# Patient Record
Sex: Male | Born: 1935 | Race: White | Hispanic: No | Smoking: Former smoker
Health system: Southern US, Community
[De-identification: ages and names within clinical notes are randomized; demographics above are authoritative.]

## PROBLEM LIST (undated history)

## (undated) DIAGNOSIS — D649 Anemia, unspecified: Secondary | ICD-10-CM

## (undated) DIAGNOSIS — I10 Essential (primary) hypertension: Secondary | ICD-10-CM

## (undated) DIAGNOSIS — N189 Chronic kidney disease, unspecified: Secondary | ICD-10-CM

## (undated) DIAGNOSIS — M1990 Unspecified osteoarthritis, unspecified site: Secondary | ICD-10-CM

## (undated) DIAGNOSIS — Z8489 Family history of other specified conditions: Secondary | ICD-10-CM

## (undated) DIAGNOSIS — J45909 Unspecified asthma, uncomplicated: Secondary | ICD-10-CM

## (undated) DIAGNOSIS — H409 Unspecified glaucoma: Secondary | ICD-10-CM

## (undated) HISTORY — PX: KNEE ARTHROSCOPY: SUR90

## (undated) HISTORY — PX: ROTATOR CUFF REPAIR: SHX139

## (undated) HISTORY — PX: SHOULDER ARTHROSCOPY: SHX128

---

## 2000-11-11 ENCOUNTER — Encounter: Payer: Self-pay | Admitting: Emergency Medicine

## 2000-11-11 ENCOUNTER — Inpatient Hospital Stay (HOSPITAL_COMMUNITY): Admission: EM | Admit: 2000-11-11 | Discharge: 2000-11-15 | Payer: Self-pay

## 2000-11-12 ENCOUNTER — Encounter: Payer: Self-pay | Admitting: Internal Medicine

## 2000-11-13 ENCOUNTER — Encounter: Payer: Self-pay | Admitting: Internal Medicine

## 2000-11-14 ENCOUNTER — Encounter: Payer: Self-pay | Admitting: Internal Medicine

## 2000-11-26 ENCOUNTER — Encounter: Admission: RE | Admit: 2000-11-26 | Discharge: 2000-11-26 | Payer: Self-pay

## 2015-01-13 DIAGNOSIS — M109 Gout, unspecified: Secondary | ICD-10-CM | POA: Diagnosis not present

## 2015-01-13 DIAGNOSIS — Z79899 Other long term (current) drug therapy: Secondary | ICD-10-CM | POA: Diagnosis not present

## 2015-01-13 DIAGNOSIS — M25569 Pain in unspecified knee: Secondary | ICD-10-CM | POA: Diagnosis not present

## 2015-01-13 DIAGNOSIS — M25561 Pain in right knee: Secondary | ICD-10-CM | POA: Diagnosis not present

## 2015-01-21 DIAGNOSIS — R140 Abdominal distension (gaseous): Secondary | ICD-10-CM | POA: Diagnosis not present

## 2015-01-21 DIAGNOSIS — R195 Other fecal abnormalities: Secondary | ICD-10-CM | POA: Diagnosis not present

## 2015-01-27 ENCOUNTER — Other Ambulatory Visit: Payer: Self-pay | Admitting: Gastroenterology

## 2015-01-27 NOTE — Addendum Note (Signed)
Addended by: Arta Silence on: 01/27/2015 03:22 PM   Modules accepted: Orders

## 2015-01-28 ENCOUNTER — Encounter (HOSPITAL_COMMUNITY): Payer: Self-pay | Admitting: *Deleted

## 2015-02-01 ENCOUNTER — Other Ambulatory Visit: Payer: Self-pay | Admitting: Gastroenterology

## 2015-02-01 NOTE — Addendum Note (Signed)
Addended by: Arta Silence on: 02/01/2015 09:18 AM   Modules accepted: Orders

## 2015-02-03 ENCOUNTER — Ambulatory Visit (HOSPITAL_COMMUNITY): Payer: PPO | Admitting: Certified Registered Nurse Anesthetist

## 2015-02-03 ENCOUNTER — Encounter (HOSPITAL_COMMUNITY)
Admission: RE | Disposition: A | Discharge status: Home / Self Care | Payer: Self-pay | Source: Ambulatory Visit | Attending: Gastroenterology

## 2015-02-03 ENCOUNTER — Encounter (HOSPITAL_COMMUNITY): Payer: Self-pay

## 2015-02-03 ENCOUNTER — Ambulatory Visit (HOSPITAL_COMMUNITY)
Admission: RE | Admit: 2015-02-03 | Discharge: 2015-02-03 | Disposition: A | Discharge status: Home / Self Care | Payer: PPO | Source: Ambulatory Visit | Attending: Gastroenterology | Admitting: Gastroenterology

## 2015-02-03 DIAGNOSIS — K219 Gastro-esophageal reflux disease without esophagitis: Secondary | ICD-10-CM | POA: Insufficient documentation

## 2015-02-03 DIAGNOSIS — Z87891 Personal history of nicotine dependence: Secondary | ICD-10-CM | POA: Diagnosis not present

## 2015-02-03 DIAGNOSIS — D649 Anemia, unspecified: Secondary | ICD-10-CM | POA: Insufficient documentation

## 2015-02-03 DIAGNOSIS — I10 Essential (primary) hypertension: Secondary | ICD-10-CM | POA: Diagnosis not present

## 2015-02-03 DIAGNOSIS — K5730 Diverticulosis of large intestine without perforation or abscess without bleeding: Secondary | ICD-10-CM | POA: Insufficient documentation

## 2015-02-03 DIAGNOSIS — J45909 Unspecified asthma, uncomplicated: Secondary | ICD-10-CM | POA: Diagnosis not present

## 2015-02-03 DIAGNOSIS — D500 Iron deficiency anemia secondary to blood loss (chronic): Secondary | ICD-10-CM | POA: Diagnosis not present

## 2015-02-03 DIAGNOSIS — M1990 Unspecified osteoarthritis, unspecified site: Secondary | ICD-10-CM | POA: Insufficient documentation

## 2015-02-03 DIAGNOSIS — I129 Hypertensive chronic kidney disease with stage 1 through stage 4 chronic kidney disease, or unspecified chronic kidney disease: Secondary | ICD-10-CM | POA: Diagnosis not present

## 2015-02-03 DIAGNOSIS — Z79899 Other long term (current) drug therapy: Secondary | ICD-10-CM | POA: Insufficient documentation

## 2015-02-03 DIAGNOSIS — R195 Other fecal abnormalities: Secondary | ICD-10-CM | POA: Insufficient documentation

## 2015-02-03 DIAGNOSIS — N189 Chronic kidney disease, unspecified: Secondary | ICD-10-CM | POA: Diagnosis not present

## 2015-02-03 DIAGNOSIS — K5790 Diverticulosis of intestine, part unspecified, without perforation or abscess without bleeding: Secondary | ICD-10-CM | POA: Diagnosis not present

## 2015-02-03 DIAGNOSIS — K921 Melena: Secondary | ICD-10-CM | POA: Diagnosis not present

## 2015-02-03 DIAGNOSIS — K648 Other hemorrhoids: Secondary | ICD-10-CM | POA: Diagnosis not present

## 2015-02-03 DIAGNOSIS — K5732 Diverticulitis of large intestine without perforation or abscess without bleeding: Secondary | ICD-10-CM | POA: Diagnosis not present

## 2015-02-03 HISTORY — DX: Anemia, unspecified: D64.9

## 2015-02-03 HISTORY — DX: Chronic kidney disease, unspecified: N18.9

## 2015-02-03 HISTORY — DX: Unspecified osteoarthritis, unspecified site: M19.90

## 2015-02-03 HISTORY — PX: COLONOSCOPY WITH PROPOFOL: SHX5780

## 2015-02-03 HISTORY — DX: Family history of other specified conditions: Z84.89

## 2015-02-03 HISTORY — DX: Unspecified asthma, uncomplicated: J45.909

## 2015-02-03 HISTORY — DX: Essential (primary) hypertension: I10

## 2015-02-03 SURGERY — COLONOSCOPY WITH PROPOFOL
Anesthesia: Monitor Anesthesia Care

## 2015-02-03 MED ORDER — ONDANSETRON HCL 4 MG/2ML IJ SOLN
INTRAMUSCULAR | Status: DC | PRN
  Administered 2015-02-03: 4 mg via INTRAVENOUS

## 2015-02-03 MED ORDER — LIDOCAINE HCL (CARDIAC) 20 MG/ML IV SOLN
INTRAVENOUS | Status: AC
  Filled 2015-02-03: qty 5

## 2015-02-03 MED ORDER — LACTATED RINGERS IV SOLN
INTRAVENOUS | Status: DC
  Administered 2015-02-03: 1000 mL via INTRAVENOUS

## 2015-02-03 MED ORDER — PROPOFOL 500 MG/50ML IV EMUL
INTRAVENOUS | Status: DC | PRN
  Administered 2015-02-03: 75 ug/kg/min via INTRAVENOUS

## 2015-02-03 MED ORDER — METOPROLOL TARTRATE 1 MG/ML IV SOLN
INTRAVENOUS | Status: DC | PRN
  Administered 2015-02-03: 1 mg via INTRAVENOUS

## 2015-02-03 MED ORDER — PROPOFOL 10 MG/ML IV BOLUS
INTRAVENOUS | Status: AC
  Filled 2015-02-03: qty 60

## 2015-02-03 MED ORDER — ONDANSETRON HCL 4 MG/2ML IJ SOLN
INTRAMUSCULAR | Status: AC
  Filled 2015-02-03: qty 2

## 2015-02-03 MED ORDER — METOPROLOL TARTRATE 1 MG/ML IV SOLN
INTRAVENOUS | Status: AC
  Filled 2015-02-03: qty 5

## 2015-02-03 MED ORDER — SODIUM CHLORIDE 0.9 % IV SOLN: INTRAVENOUS | Status: DC

## 2015-02-03 MED ORDER — LIDOCAINE HCL (CARDIAC) 20 MG/ML IV SOLN
INTRAVENOUS | Status: DC | PRN
  Administered 2015-02-03: 100 mg via INTRAVENOUS

## 2015-02-03 MED ORDER — PROPOFOL 10 MG/ML IV BOLUS
INTRAVENOUS | Status: DC | PRN
  Administered 2015-02-03 (×4): 10 mg via INTRAVENOUS

## 2015-02-03 MED ORDER — LACTATED RINGERS IV SOLN: INTRAVENOUS | Status: DC

## 2015-02-03 SURGICAL SUPPLY — 21 items

## 2015-02-03 NOTE — Anesthesia Postprocedure Evaluation (Signed)
Anesthesia Post Note  Patient: Nicholas [name]  Procedure(s) Performed: Procedure(s) (LRB): COLONOSCOPY WITH PROPOFOL (N/A)  Patient location during evaluation: PACU Anesthesia Type: MAC Level of consciousness: awake and alert Pain management: pain level controlled Vital Signs Assessment: post-procedure vital signs reviewed and stable Respiratory status: spontaneous breathing, nonlabored ventilation, respiratory function stable and patient connected to nasal cannula oxygen Cardiovascular status: stable and blood pressure returned to baseline Anesthetic complications: no    Last Vitals:  Filed Vitals:   02/03/15 1020 02/03/15 1028  BP: 166/78 171/63  Pulse: 52 57  Temp:    Resp: 16     Last Pain: There were no vitals filed for this visit.               Effie Berkshire

## 2015-02-03 NOTE — Op Note (Signed)
[facility] [place], [address]   COLONOSCOPY PROCEDURE REPORT  PATIENT: [name], Nicholas  MR#: [id number] BIRTHDATE: Jun 05, 1933 , 81  yrs. old GENDER: male ENDOSCOPIST: Arta Silence, MD REFERRED [place]:[id number] Marcello Moores, M.D. PROCEDURE DATE:  Mar 03, 2015 PROCEDURE:   Colonoscopy, diagnostic ASA CLASS:   Class III INDICATIONS:hemoccult-positive stools, (mild) anemia. MEDICATIONS: Monitored anesthesia care  DESCRIPTION OF PROCEDURE:   After the risks benefits and alternatives of the procedure were thoroughly explained, informed consent was obtained.  revealed no abnormalities of the rectum. The pediatric colonoscope was introduced through the anus and advanced to the cecum, which was identified by both the appendix and ileocecal valve. No adverse events experienced.   The quality of the prep was adequate  The instrument was then slowly withdrawn as the colon was fully examined. Estimated blood loss is zero unless otherwise noted in this procedure report.    Findings:  Digital rectal exam normal.  Prep quality adequate.  No polyps, masses, vascular ectasias, or inflammatory changes were seen.  Extensive medium- and large-caliber diverticula seen in sigmoid and descending colon.  Moderate internal hemorrhoids, otherwise normal retroflexed view of rectum.  Withdrawal time was 9 minutes.           Withdrawal time was   .  The scope was withdrawn and the procedure completed.  COMPLICATIONS: None immediate.  ENDOSCOPIC IMPRESSION:     Left-sided diverticulosis.  Moderate internal hemorrhoids, likely cause of patient's heme-positive stool.  RECOMMENDATIONS:     1.  Watch for potential complications of procedure. 2.  High fiber diet. 3.  Topical therapies (e.g., Preparation-H) as needed for hemorrhoidal bleeding and/or irritation. 4.  Would not pursue any further testing for patient's hemoccult-positive stool. 5.  Would stop checking hemoccults  indefinitely. 6.  Would not pursue any further routine colonoscopies, in absence of new/interval symptoms, in light of patient's age and comorbidities. 7.  Follow-up with [facility] on as-needed basis.  eSigned:  Arta Silence, MD March 03, 2015 10:00 AM   cc:  CPT CODES: ICD CODES:  The ICD and CPT codes recommended by this software are interpretations from the data that the clinical staff has captured with the software.  The verification of the translation of this report to the ICD and CPT codes and modifiers is the sole responsibility of the health care institution and practicing physician where this report was generated.  [employer]. will not be held responsible for the validity of the ICD and CPT codes included on this report.  AMA assumes no liability for data contained or not contained herein. CPT is a Designer, television/film set of the [employer].

## 2015-02-03 NOTE — Anesthesia Preprocedure Evaluation (Addendum)
Anesthesia Evaluation  Patient identified by MRN, date of birth, ID band Patient awake    Reviewed: Allergy & Precautions, NPO status , Patient's Chart, lab work & pertinent test results  Airway Mallampati: II  TM Distance: >3 FB Neck ROM: Full    Dental  (+) Upper Dentures, Lower Dentures   Pulmonary asthma , former smoker,    breath sounds clear to auscultation       Cardiovascular hypertension, Pt. on medications  Rhythm:Regular Rate:Normal + Systolic murmurs    Neuro/Psych negative neurological ROS  negative psych ROS   GI/Hepatic Neg liver ROS, GERD  Medicated,  Endo/Other  negative endocrine ROS  Renal/GU Renal disease  negative genitourinary   Musculoskeletal  (+) Arthritis ,   Abdominal   Peds negative pediatric ROS (+)  Hematology   Anesthesia Other Findings   Reproductive/Obstetrics                         Per outside records Hgb 12 K 4.8 Cr 1.6  Anesthesia Physical Anesthesia Plan  ASA: III  Anesthesia Plan: MAC   Post-op Pain Management:    Induction: Intravenous  Airway Management Planned: Natural Airway  Additional Equipment:   Intra-op Plan:   Post-operative Plan:   Informed Consent: I have reviewed the patients History and Physical, chart, labs and discussed the procedure including the risks, benefits and alternatives for the proposed anesthesia with the patient or authorized representative who has indicated his/her understanding and acceptance.     Plan Discussed with: CRNA  Anesthesia Plan Comments:         Anesthesia Quick Evaluation

## 2015-02-03 NOTE — H&P (Signed)
Patient interval history reviewed.  Patient examined again.  There has been no change from documented H/P dated 01/21/15 scanned into chart from our office) except as documented above.  Assessment:  1.  Hemoccult-positive stools. 2.  Anemia, chronic, mild.  Plan:  1.  Colonoscopy. 2.  Risks (bleeding, infection, bowel perforation that could require surgery, sedation-related changes in cardiopulmonary systems), benefits (identification and possible treatment of source of symptoms, exclusion of certain causes of symptoms), and alternatives (watchful waiting, radiographic imaging studies, empiric medical treatment) of colonoscopy were explained to patient/family in detail and patient wishes to proceed.

## 2015-02-03 NOTE — Discharge Instructions (Signed)
Colonoscopy ° °Post procedure instructions: ° °Read the instructions outlined below and refer to this sheet in the next few weeks. These discharge instructions provide you with general information on caring for yourself after you leave the hospital. Your doctor may also give you specific instructions. While your treatment has been planned according to the most current medical practices available, unavoidable complications occasionally occur. If you have any problems or questions after discharge, call Dr. Shakeya Kerkman at [facility] ([phone]). ° °HOME CARE INSTRUCTIONS ° °ACTIVITY: °· You may resume your regular activity, but move at a slower pace for the next 24 hours.  °· Take frequent rest periods for the next 24 hours.  °· Walking will help get rid of the air and reduce the bloated feeling in your belly (abdomen).  °· No driving for 24 hours (because of the medicine (anesthesia) used during the test).  °· You may shower.  °· Do not sign any important legal documents or operate any machinery for 24 hours (because of the anesthesia used during the test).  °NUTRITION: °· Drink plenty of fluids.  °· You may resume your normal diet as instructed by your doctor.  °· Begin with a light meal and progress to your normal diet. Heavy or fried foods are harder to digest and may make you feel sick to your stomach (nauseated).  °· Avoid alcoholic beverages for 24 hours or as instructed.  °MEDICATIONS: °· You may resume your normal medications unless your doctor tells you otherwise.  °WHAT TO EXPECT TODAY: °· Some feelings of bloating in the abdomen.  °· Passage of more gas than usual.  °· Spotting of blood in your stool or on the toilet paper.  °IF YOU HAD POLYPS REMOVED DURING THE COLONOSCOPY: °· No aspirin products for 7 days or as instructed.  °· No alcohol for 7 days or as instructed.  °· Eat a soft diet for the next 24 hours.  ° °FINDING OUT THE RESULTS OF YOUR TEST ° °Not all test results are available during your  visit. If your test results are not back during the visit, make an appointment with your caregiver to find out the results. Do not assume everything is normal if you have not heard from your caregiver or the medical facility. It is important for you to follow up on all of your test results.  ° ° ° °SEEK IMMEDIATE MEDICAL CARE IF: ° °· You have more than a spotting of blood in your stool.  °· Your belly is swollen (abdominal distention).  °· You are nauseated or vomiting.  °· You have a fever.  °· You have abdominal pain or discomfort that is severe or gets worse throughout the day.  ° ° °Document Released: 08/10/2003 Document Revised: 09/07/2010 Document Reviewed: 08/08/2007 °Exit[employer]® Patient Information ©2012 [employer]. ° °

## 2015-02-03 NOTE — Transfer of Care (Signed)
Immediate Anesthesia Transfer of Care Note  Patient: Nicholas [name]  Procedure(s) Performed: Procedure(s): COLONOSCOPY WITH PROPOFOL (N/A)  Patient Location: ENDO  Anesthesia Type:MAC  Level of Consciousness:  sedated, patient cooperative and responds to stimulation  Airway & Oxygen Therapy:Patient Spontanous Breathing and Patient connected to face mask oxgen  Post-op Assessment:  Report given to ENDO RN and Post -op Vital signs reviewed and stable  Post vital signs:  Reviewed and stable  Last Vitals:  Filed Vitals:   02/03/15 0812  BP: 182/62  Pulse: 55  Temp: 36.6 C  Resp: 16    Complications: No apparent anesthesia complications

## 2015-02-04 ENCOUNTER — Encounter (HOSPITAL_COMMUNITY): Payer: Self-pay | Admitting: Gastroenterology

## 2015-02-09 DIAGNOSIS — L089 Local infection of the skin and subcutaneous tissue, unspecified: Secondary | ICD-10-CM | POA: Diagnosis not present

## 2015-02-09 DIAGNOSIS — T148 Other injury of unspecified body region: Secondary | ICD-10-CM | POA: Diagnosis not present

## 2015-05-31 DIAGNOSIS — Z1389 Encounter for screening for other disorder: Secondary | ICD-10-CM | POA: Diagnosis not present

## 2015-05-31 DIAGNOSIS — Z6830 Body mass index (BMI) 30.0-30.9, adult: Secondary | ICD-10-CM | POA: Diagnosis not present

## 2015-05-31 DIAGNOSIS — E669 Obesity, unspecified: Secondary | ICD-10-CM | POA: Diagnosis not present

## 2015-05-31 DIAGNOSIS — E785 Hyperlipidemia, unspecified: Secondary | ICD-10-CM | POA: Diagnosis not present

## 2015-05-31 DIAGNOSIS — Z125 Encounter for screening for malignant neoplasm of prostate: Secondary | ICD-10-CM | POA: Diagnosis not present

## 2015-05-31 DIAGNOSIS — Z136 Encounter for screening for cardiovascular disorders: Secondary | ICD-10-CM | POA: Diagnosis not present

## 2015-05-31 DIAGNOSIS — Z9181 History of falling: Secondary | ICD-10-CM | POA: Diagnosis not present

## 2015-05-31 DIAGNOSIS — N183 Chronic kidney disease, stage 3 (moderate): Secondary | ICD-10-CM | POA: Diagnosis not present

## 2015-05-31 DIAGNOSIS — J449 Chronic obstructive pulmonary disease, unspecified: Secondary | ICD-10-CM | POA: Diagnosis not present

## 2015-05-31 DIAGNOSIS — R7303 Prediabetes: Secondary | ICD-10-CM | POA: Diagnosis not present

## 2015-05-31 DIAGNOSIS — K219 Gastro-esophageal reflux disease without esophagitis: Secondary | ICD-10-CM | POA: Diagnosis not present

## 2015-05-31 DIAGNOSIS — Z0000 Encounter for general adult medical examination without abnormal findings: Secondary | ICD-10-CM | POA: Diagnosis not present

## 2015-05-31 DIAGNOSIS — I129 Hypertensive chronic kidney disease with stage 1 through stage 4 chronic kidney disease, or unspecified chronic kidney disease: Secondary | ICD-10-CM | POA: Diagnosis not present

## 2015-05-31 DIAGNOSIS — M109 Gout, unspecified: Secondary | ICD-10-CM | POA: Diagnosis not present

## 2015-06-17 DIAGNOSIS — H40023 Open angle with borderline findings, high risk, bilateral: Secondary | ICD-10-CM | POA: Diagnosis not present

## 2015-06-17 DIAGNOSIS — H5203 Hypermetropia, bilateral: Secondary | ICD-10-CM | POA: Diagnosis not present

## 2015-07-30 DIAGNOSIS — H25013 Cortical age-related cataract, bilateral: Secondary | ICD-10-CM | POA: Diagnosis not present

## 2015-07-30 DIAGNOSIS — H40023 Open angle with borderline findings, high risk, bilateral: Secondary | ICD-10-CM | POA: Diagnosis not present

## 2015-10-12 DIAGNOSIS — Z23 Encounter for immunization: Secondary | ICD-10-CM | POA: Diagnosis not present

## 2015-11-02 DIAGNOSIS — H40023 Open angle with borderline findings, high risk, bilateral: Secondary | ICD-10-CM | POA: Diagnosis not present

## 2015-11-20 DIAGNOSIS — M109 Gout, unspecified: Secondary | ICD-10-CM | POA: Diagnosis not present

## 2015-12-14 DIAGNOSIS — Z1389 Encounter for screening for other disorder: Secondary | ICD-10-CM | POA: Diagnosis not present

## 2015-12-14 DIAGNOSIS — M109 Gout, unspecified: Secondary | ICD-10-CM | POA: Diagnosis not present

## 2015-12-14 DIAGNOSIS — Z0000 Encounter for general adult medical examination without abnormal findings: Secondary | ICD-10-CM | POA: Diagnosis not present

## 2015-12-14 DIAGNOSIS — I129 Hypertensive chronic kidney disease with stage 1 through stage 4 chronic kidney disease, or unspecified chronic kidney disease: Secondary | ICD-10-CM | POA: Diagnosis not present

## 2015-12-14 DIAGNOSIS — Z1211 Encounter for screening for malignant neoplasm of colon: Secondary | ICD-10-CM | POA: Diagnosis not present

## 2015-12-14 DIAGNOSIS — N183 Chronic kidney disease, stage 3 (moderate): Secondary | ICD-10-CM | POA: Diagnosis not present

## 2015-12-14 DIAGNOSIS — E559 Vitamin D deficiency, unspecified: Secondary | ICD-10-CM | POA: Diagnosis not present

## 2015-12-14 DIAGNOSIS — Z79899 Other long term (current) drug therapy: Secondary | ICD-10-CM | POA: Diagnosis not present

## 2015-12-14 DIAGNOSIS — E669 Obesity, unspecified: Secondary | ICD-10-CM | POA: Diagnosis not present

## 2015-12-14 DIAGNOSIS — Z125 Encounter for screening for malignant neoplasm of prostate: Secondary | ICD-10-CM | POA: Diagnosis not present

## 2015-12-14 DIAGNOSIS — Z9181 History of falling: Secondary | ICD-10-CM | POA: Diagnosis not present

## 2015-12-14 DIAGNOSIS — E785 Hyperlipidemia, unspecified: Secondary | ICD-10-CM | POA: Diagnosis not present

## 2015-12-20 DIAGNOSIS — Z79899 Other long term (current) drug therapy: Secondary | ICD-10-CM | POA: Diagnosis not present

## 2015-12-20 DIAGNOSIS — M109 Gout, unspecified: Secondary | ICD-10-CM | POA: Diagnosis not present

## 2015-12-20 DIAGNOSIS — E785 Hyperlipidemia, unspecified: Secondary | ICD-10-CM | POA: Diagnosis not present

## 2015-12-20 DIAGNOSIS — R7303 Prediabetes: Secondary | ICD-10-CM | POA: Diagnosis not present

## 2015-12-20 DIAGNOSIS — J449 Chronic obstructive pulmonary disease, unspecified: Secondary | ICD-10-CM | POA: Diagnosis not present

## 2015-12-20 DIAGNOSIS — I129 Hypertensive chronic kidney disease with stage 1 through stage 4 chronic kidney disease, or unspecified chronic kidney disease: Secondary | ICD-10-CM | POA: Diagnosis not present

## 2015-12-20 DIAGNOSIS — K219 Gastro-esophageal reflux disease without esophagitis: Secondary | ICD-10-CM | POA: Diagnosis not present

## 2015-12-20 DIAGNOSIS — N183 Chronic kidney disease, stage 3 (moderate): Secondary | ICD-10-CM | POA: Diagnosis not present

## 2016-05-03 DIAGNOSIS — H40023 Open angle with borderline findings, high risk, bilateral: Secondary | ICD-10-CM | POA: Diagnosis not present

## 2016-05-03 DIAGNOSIS — H25013 Cortical age-related cataract, bilateral: Secondary | ICD-10-CM | POA: Diagnosis not present

## 2016-05-22 DIAGNOSIS — M25512 Pain in left shoulder: Secondary | ICD-10-CM | POA: Diagnosis not present

## 2016-05-22 DIAGNOSIS — M7552 Bursitis of left shoulder: Secondary | ICD-10-CM | POA: Diagnosis not present

## 2016-05-24 DIAGNOSIS — M25512 Pain in left shoulder: Secondary | ICD-10-CM | POA: Diagnosis not present

## 2016-05-24 DIAGNOSIS — M7552 Bursitis of left shoulder: Secondary | ICD-10-CM | POA: Diagnosis not present

## 2016-05-31 DIAGNOSIS — M7552 Bursitis of left shoulder: Secondary | ICD-10-CM | POA: Diagnosis not present

## 2016-05-31 DIAGNOSIS — M25512 Pain in left shoulder: Secondary | ICD-10-CM | POA: Diagnosis not present

## 2016-06-01 DIAGNOSIS — Z79899 Other long term (current) drug therapy: Secondary | ICD-10-CM | POA: Diagnosis not present

## 2016-06-01 DIAGNOSIS — I10 Essential (primary) hypertension: Secondary | ICD-10-CM | POA: Diagnosis not present

## 2016-06-01 DIAGNOSIS — Z0000 Encounter for general adult medical examination without abnormal findings: Secondary | ICD-10-CM | POA: Diagnosis not present

## 2016-06-01 DIAGNOSIS — E785 Hyperlipidemia, unspecified: Secondary | ICD-10-CM | POA: Diagnosis not present

## 2016-06-07 DIAGNOSIS — M25512 Pain in left shoulder: Secondary | ICD-10-CM | POA: Diagnosis not present

## 2016-06-07 DIAGNOSIS — M7552 Bursitis of left shoulder: Secondary | ICD-10-CM | POA: Diagnosis not present

## 2016-06-14 DIAGNOSIS — H401132 Primary open-angle glaucoma, bilateral, moderate stage: Secondary | ICD-10-CM | POA: Diagnosis not present

## 2016-06-26 DIAGNOSIS — M7552 Bursitis of left shoulder: Secondary | ICD-10-CM | POA: Diagnosis not present

## 2016-07-05 DIAGNOSIS — I10 Essential (primary) hypertension: Secondary | ICD-10-CM | POA: Diagnosis not present

## 2016-08-15 DIAGNOSIS — H5203 Hypermetropia, bilateral: Secondary | ICD-10-CM | POA: Diagnosis not present

## 2016-08-15 DIAGNOSIS — H401132 Primary open-angle glaucoma, bilateral, moderate stage: Secondary | ICD-10-CM | POA: Diagnosis not present

## 2016-08-15 DIAGNOSIS — D3131 Benign neoplasm of right choroid: Secondary | ICD-10-CM | POA: Diagnosis not present

## 2016-10-17 DIAGNOSIS — Z23 Encounter for immunization: Secondary | ICD-10-CM | POA: Diagnosis not present

## 2016-11-03 DIAGNOSIS — I10 Essential (primary) hypertension: Secondary | ICD-10-CM | POA: Diagnosis not present

## 2016-12-19 DIAGNOSIS — H25013 Cortical age-related cataract, bilateral: Secondary | ICD-10-CM | POA: Diagnosis not present

## 2016-12-19 DIAGNOSIS — H401132 Primary open-angle glaucoma, bilateral, moderate stage: Secondary | ICD-10-CM | POA: Diagnosis not present

## 2016-12-25 DIAGNOSIS — R7303 Prediabetes: Secondary | ICD-10-CM | POA: Diagnosis not present

## 2016-12-25 DIAGNOSIS — Z1331 Encounter for screening for depression: Secondary | ICD-10-CM | POA: Diagnosis not present

## 2016-12-25 DIAGNOSIS — I10 Essential (primary) hypertension: Secondary | ICD-10-CM | POA: Diagnosis not present

## 2016-12-25 DIAGNOSIS — Z79899 Other long term (current) drug therapy: Secondary | ICD-10-CM | POA: Diagnosis not present

## 2016-12-25 DIAGNOSIS — J449 Chronic obstructive pulmonary disease, unspecified: Secondary | ICD-10-CM | POA: Diagnosis not present

## 2016-12-25 DIAGNOSIS — E785 Hyperlipidemia, unspecified: Secondary | ICD-10-CM | POA: Diagnosis not present

## 2016-12-25 DIAGNOSIS — K219 Gastro-esophageal reflux disease without esophagitis: Secondary | ICD-10-CM | POA: Diagnosis not present

## 2016-12-25 DIAGNOSIS — M109 Gout, unspecified: Secondary | ICD-10-CM | POA: Diagnosis not present

## 2017-01-15 DIAGNOSIS — B309 Viral conjunctivitis, unspecified: Secondary | ICD-10-CM | POA: Diagnosis not present

## 2017-01-29 DIAGNOSIS — M25562 Pain in left knee: Secondary | ICD-10-CM | POA: Diagnosis not present

## 2017-02-12 DIAGNOSIS — M25562 Pain in left knee: Secondary | ICD-10-CM | POA: Diagnosis not present

## 2017-02-12 DIAGNOSIS — M1712 Unilateral primary osteoarthritis, left knee: Secondary | ICD-10-CM | POA: Diagnosis not present

## 2017-02-14 DIAGNOSIS — M25562 Pain in left knee: Secondary | ICD-10-CM | POA: Diagnosis not present

## 2017-02-14 DIAGNOSIS — M1712 Unilateral primary osteoarthritis, left knee: Secondary | ICD-10-CM | POA: Diagnosis not present

## 2017-02-15 DIAGNOSIS — M1712 Unilateral primary osteoarthritis, left knee: Secondary | ICD-10-CM | POA: Diagnosis not present

## 2017-02-15 DIAGNOSIS — S83242A Other tear of medial meniscus, current injury, left knee, initial encounter: Secondary | ICD-10-CM | POA: Diagnosis not present

## 2017-02-22 DIAGNOSIS — M79641 Pain in right hand: Secondary | ICD-10-CM | POA: Diagnosis not present

## 2017-03-30 DIAGNOSIS — R112 Nausea with vomiting, unspecified: Secondary | ICD-10-CM | POA: Diagnosis not present

## 2017-03-30 DIAGNOSIS — Z7982 Long term (current) use of aspirin: Secondary | ICD-10-CM | POA: Diagnosis not present

## 2017-03-30 DIAGNOSIS — R030 Elevated blood-pressure reading, without diagnosis of hypertension: Secondary | ICD-10-CM | POA: Diagnosis not present

## 2017-03-30 DIAGNOSIS — I6789 Other cerebrovascular disease: Secondary | ICD-10-CM | POA: Diagnosis not present

## 2017-03-30 DIAGNOSIS — I129 Hypertensive chronic kidney disease with stage 1 through stage 4 chronic kidney disease, or unspecified chronic kidney disease: Secondary | ICD-10-CM | POA: Diagnosis not present

## 2017-03-30 DIAGNOSIS — R42 Dizziness and giddiness: Secondary | ICD-10-CM | POA: Diagnosis not present

## 2017-03-30 DIAGNOSIS — I252 Old myocardial infarction: Secondary | ICD-10-CM | POA: Diagnosis not present

## 2017-03-30 DIAGNOSIS — K219 Gastro-esophageal reflux disease without esophagitis: Secondary | ICD-10-CM | POA: Diagnosis not present

## 2017-03-30 DIAGNOSIS — I10 Essential (primary) hypertension: Secondary | ICD-10-CM | POA: Diagnosis not present

## 2017-03-30 DIAGNOSIS — E7800 Pure hypercholesterolemia, unspecified: Secondary | ICD-10-CM | POA: Diagnosis not present

## 2017-03-30 DIAGNOSIS — G459 Transient cerebral ischemic attack, unspecified: Secondary | ICD-10-CM | POA: Diagnosis not present

## 2017-03-30 DIAGNOSIS — I6523 Occlusion and stenosis of bilateral carotid arteries: Secondary | ICD-10-CM | POA: Diagnosis not present

## 2017-03-30 DIAGNOSIS — N183 Chronic kidney disease, stage 3 (moderate): Secondary | ICD-10-CM | POA: Diagnosis not present

## 2017-03-30 DIAGNOSIS — R413 Other amnesia: Secondary | ICD-10-CM | POA: Diagnosis not present

## 2017-03-30 DIAGNOSIS — E785 Hyperlipidemia, unspecified: Secondary | ICD-10-CM | POA: Diagnosis not present

## 2017-03-30 DIAGNOSIS — Z79899 Other long term (current) drug therapy: Secondary | ICD-10-CM | POA: Diagnosis not present

## 2017-03-30 DIAGNOSIS — G9389 Other specified disorders of brain: Secondary | ICD-10-CM | POA: Diagnosis not present

## 2017-03-30 DIAGNOSIS — R531 Weakness: Secondary | ICD-10-CM | POA: Diagnosis not present

## 2017-03-31 DIAGNOSIS — I6789 Other cerebrovascular disease: Secondary | ICD-10-CM | POA: Diagnosis not present

## 2017-03-31 DIAGNOSIS — R42 Dizziness and giddiness: Secondary | ICD-10-CM | POA: Diagnosis not present

## 2017-03-31 DIAGNOSIS — I10 Essential (primary) hypertension: Secondary | ICD-10-CM | POA: Diagnosis not present

## 2017-04-16 DIAGNOSIS — E663 Overweight: Secondary | ICD-10-CM | POA: Diagnosis not present

## 2017-04-16 DIAGNOSIS — K219 Gastro-esophageal reflux disease without esophagitis: Secondary | ICD-10-CM | POA: Diagnosis not present

## 2017-04-16 DIAGNOSIS — I259 Chronic ischemic heart disease, unspecified: Secondary | ICD-10-CM | POA: Diagnosis not present

## 2017-04-16 DIAGNOSIS — N189 Chronic kidney disease, unspecified: Secondary | ICD-10-CM | POA: Diagnosis not present

## 2017-04-16 DIAGNOSIS — I4510 Unspecified right bundle-branch block: Secondary | ICD-10-CM | POA: Diagnosis not present

## 2017-04-16 DIAGNOSIS — J449 Chronic obstructive pulmonary disease, unspecified: Secondary | ICD-10-CM | POA: Diagnosis not present

## 2017-04-16 DIAGNOSIS — Z6829 Body mass index (BMI) 29.0-29.9, adult: Secondary | ICD-10-CM | POA: Diagnosis not present

## 2017-04-16 DIAGNOSIS — E785 Hyperlipidemia, unspecified: Secondary | ICD-10-CM | POA: Diagnosis not present

## 2017-04-16 DIAGNOSIS — I119 Hypertensive heart disease without heart failure: Secondary | ICD-10-CM | POA: Diagnosis not present

## 2017-04-16 DIAGNOSIS — I10 Essential (primary) hypertension: Secondary | ICD-10-CM | POA: Diagnosis not present

## 2017-04-16 DIAGNOSIS — Q049 Congenital malformation of brain, unspecified: Secondary | ICD-10-CM | POA: Diagnosis not present

## 2017-04-16 DIAGNOSIS — Z1339 Encounter for screening examination for other mental health and behavioral disorders: Secondary | ICD-10-CM | POA: Diagnosis not present

## 2017-04-17 DIAGNOSIS — H25013 Cortical age-related cataract, bilateral: Secondary | ICD-10-CM | POA: Diagnosis not present

## 2017-04-17 DIAGNOSIS — H401132 Primary open-angle glaucoma, bilateral, moderate stage: Secondary | ICD-10-CM | POA: Diagnosis not present

## 2017-08-17 DIAGNOSIS — H5203 Hypermetropia, bilateral: Secondary | ICD-10-CM | POA: Diagnosis not present

## 2017-08-17 DIAGNOSIS — D3131 Benign neoplasm of right choroid: Secondary | ICD-10-CM | POA: Diagnosis not present

## 2017-08-17 DIAGNOSIS — H401132 Primary open-angle glaucoma, bilateral, moderate stage: Secondary | ICD-10-CM | POA: Diagnosis not present

## 2017-10-01 DIAGNOSIS — Z23 Encounter for immunization: Secondary | ICD-10-CM | POA: Diagnosis not present

## 2017-10-03 DIAGNOSIS — Z6829 Body mass index (BMI) 29.0-29.9, adult: Secondary | ICD-10-CM | POA: Diagnosis not present

## 2017-10-03 DIAGNOSIS — I10 Essential (primary) hypertension: Secondary | ICD-10-CM | POA: Diagnosis not present

## 2017-10-03 DIAGNOSIS — H6123 Impacted cerumen, bilateral: Secondary | ICD-10-CM | POA: Diagnosis not present

## 2017-10-11 DIAGNOSIS — E785 Hyperlipidemia, unspecified: Secondary | ICD-10-CM | POA: Diagnosis not present

## 2017-10-11 DIAGNOSIS — I119 Hypertensive heart disease without heart failure: Secondary | ICD-10-CM | POA: Diagnosis not present

## 2017-10-11 DIAGNOSIS — Z6829 Body mass index (BMI) 29.0-29.9, adult: Secondary | ICD-10-CM | POA: Diagnosis not present

## 2017-10-11 DIAGNOSIS — H6123 Impacted cerumen, bilateral: Secondary | ICD-10-CM | POA: Diagnosis not present

## 2017-10-11 DIAGNOSIS — I259 Chronic ischemic heart disease, unspecified: Secondary | ICD-10-CM | POA: Diagnosis not present

## 2017-10-11 DIAGNOSIS — I10 Essential (primary) hypertension: Secondary | ICD-10-CM | POA: Diagnosis not present

## 2017-10-15 DIAGNOSIS — Z0000 Encounter for general adult medical examination without abnormal findings: Secondary | ICD-10-CM | POA: Diagnosis not present

## 2017-10-15 DIAGNOSIS — H6121 Impacted cerumen, right ear: Secondary | ICD-10-CM | POA: Diagnosis not present

## 2017-10-15 DIAGNOSIS — Z79899 Other long term (current) drug therapy: Secondary | ICD-10-CM | POA: Diagnosis not present

## 2017-10-15 DIAGNOSIS — J449 Chronic obstructive pulmonary disease, unspecified: Secondary | ICD-10-CM | POA: Diagnosis not present

## 2017-10-15 DIAGNOSIS — E785 Hyperlipidemia, unspecified: Secondary | ICD-10-CM | POA: Diagnosis not present

## 2017-10-15 DIAGNOSIS — E663 Overweight: Secondary | ICD-10-CM | POA: Diagnosis not present

## 2017-10-15 DIAGNOSIS — R739 Hyperglycemia, unspecified: Secondary | ICD-10-CM | POA: Diagnosis not present

## 2017-10-15 DIAGNOSIS — I119 Hypertensive heart disease without heart failure: Secondary | ICD-10-CM | POA: Diagnosis not present

## 2017-10-15 DIAGNOSIS — I10 Essential (primary) hypertension: Secondary | ICD-10-CM | POA: Diagnosis not present

## 2017-10-15 DIAGNOSIS — K219 Gastro-esophageal reflux disease without esophagitis: Secondary | ICD-10-CM | POA: Diagnosis not present

## 2017-10-15 DIAGNOSIS — I259 Chronic ischemic heart disease, unspecified: Secondary | ICD-10-CM | POA: Diagnosis not present

## 2017-10-15 DIAGNOSIS — N189 Chronic kidney disease, unspecified: Secondary | ICD-10-CM | POA: Diagnosis not present

## 2017-10-25 DIAGNOSIS — E785 Hyperlipidemia, unspecified: Secondary | ICD-10-CM | POA: Diagnosis not present

## 2017-10-25 DIAGNOSIS — I5023 Acute on chronic systolic (congestive) heart failure: Secondary | ICD-10-CM | POA: Diagnosis not present

## 2017-10-25 DIAGNOSIS — Z87891 Personal history of nicotine dependence: Secondary | ICD-10-CM | POA: Diagnosis not present

## 2017-10-25 DIAGNOSIS — J449 Chronic obstructive pulmonary disease, unspecified: Secondary | ICD-10-CM | POA: Diagnosis not present

## 2017-10-25 DIAGNOSIS — R42 Dizziness and giddiness: Secondary | ICD-10-CM | POA: Diagnosis not present

## 2017-10-25 DIAGNOSIS — M109 Gout, unspecified: Secondary | ICD-10-CM | POA: Diagnosis not present

## 2017-10-25 DIAGNOSIS — I252 Old myocardial infarction: Secondary | ICD-10-CM | POA: Diagnosis not present

## 2017-10-25 DIAGNOSIS — Z79899 Other long term (current) drug therapy: Secondary | ICD-10-CM | POA: Diagnosis not present

## 2017-10-25 DIAGNOSIS — Z7982 Long term (current) use of aspirin: Secondary | ICD-10-CM | POA: Diagnosis not present

## 2017-10-25 DIAGNOSIS — R55 Syncope and collapse: Secondary | ICD-10-CM | POA: Diagnosis not present

## 2017-10-25 DIAGNOSIS — J9811 Atelectasis: Secondary | ICD-10-CM | POA: Diagnosis not present

## 2017-10-25 DIAGNOSIS — R51 Headache: Secondary | ICD-10-CM | POA: Diagnosis not present

## 2017-10-25 DIAGNOSIS — R001 Bradycardia, unspecified: Secondary | ICD-10-CM | POA: Diagnosis not present

## 2017-10-25 DIAGNOSIS — Z880 Allergy status to penicillin: Secondary | ICD-10-CM | POA: Diagnosis not present

## 2017-10-25 DIAGNOSIS — N183 Chronic kidney disease, stage 3 (moderate): Secondary | ICD-10-CM | POA: Diagnosis not present

## 2017-10-25 DIAGNOSIS — I130 Hypertensive heart and chronic kidney disease with heart failure and stage 1 through stage 4 chronic kidney disease, or unspecified chronic kidney disease: Secondary | ICD-10-CM | POA: Diagnosis not present

## 2017-10-25 DIAGNOSIS — M1990 Unspecified osteoarthritis, unspecified site: Secondary | ICD-10-CM | POA: Diagnosis not present

## 2017-10-26 DIAGNOSIS — R55 Syncope and collapse: Secondary | ICD-10-CM | POA: Diagnosis not present

## 2017-10-26 DIAGNOSIS — I130 Hypertensive heart and chronic kidney disease with heart failure and stage 1 through stage 4 chronic kidney disease, or unspecified chronic kidney disease: Secondary | ICD-10-CM | POA: Diagnosis not present

## 2017-10-26 DIAGNOSIS — R001 Bradycardia, unspecified: Secondary | ICD-10-CM | POA: Diagnosis not present

## 2017-10-26 DIAGNOSIS — R42 Dizziness and giddiness: Secondary | ICD-10-CM | POA: Diagnosis not present

## 2017-10-29 ENCOUNTER — Other Ambulatory Visit: Payer: Self-pay

## 2017-10-29 NOTE — Patient Outreach (Signed)
[place] [facility]) Care Management  10/29/2017  TRENT GABLER 1933-08-13 [id number]   Referral Date: 10/29/17 Referral Source: [facility] report Date of Admission: unknown Diagnosis: Syncope Date of Discharge: 10/26/17 Facility: [place]: [facility]  Outreach attempt: No answer.  Unable to leave a message.    Plan: RN CM will attempt patient again within 4 business days and send letter.   Jone Baseman, RN, MSN [facility] Care Management Care Management Coordinator Direct Line [phone] Toll Free: [phone]  Fax: [phone]

## 2017-10-31 ENCOUNTER — Other Ambulatory Visit: Payer: Self-pay

## 2017-10-31 DIAGNOSIS — R05 Cough: Secondary | ICD-10-CM | POA: Diagnosis not present

## 2017-10-31 DIAGNOSIS — R42 Dizziness and giddiness: Secondary | ICD-10-CM | POA: Diagnosis not present

## 2017-10-31 DIAGNOSIS — R51 Headache: Secondary | ICD-10-CM | POA: Diagnosis not present

## 2017-10-31 NOTE — Patient Outreach (Addendum)
[place] [facility]) Care Management  10/31/2017  Nicholas [name] [birth year] [id number]   Referral Date: 10/29/17 Referral Source: [facility] report Date of Admission: unknown Diagnosis: Syncope Date of Discharge: 10/26/17 Facility: [place]: [facility]  Outreach attempt: spoke with patient.  He states he went to the hospital as he felt he was going to pass out.  Patient states he was not admitted just under observation.  He states he has follow up with his PCP Dr. [name] on Monday.  Patient voices no problems with transportation.     Patient reports he lives with his grandson and family.  He is independent with care and manages his own medications.  Discussed [facility] services. Patient declines needs at this time.     Plan: RN CM will close case.    Jone Baseman, RN, MSN [place] [facility] Care Management Coordinator Direct Line [phone] Cell [phone] Toll Free: [phone]  Fax: [phone]

## 2017-11-01 ENCOUNTER — Other Ambulatory Visit: Payer: Self-pay

## 2017-11-01 ENCOUNTER — Emergency Department (HOSPITAL_COMMUNITY): Payer: PPO

## 2017-11-01 ENCOUNTER — Ambulatory Visit: Payer: Self-pay

## 2017-11-01 ENCOUNTER — Encounter (HOSPITAL_COMMUNITY): Payer: Self-pay

## 2017-11-01 ENCOUNTER — Inpatient Hospital Stay (HOSPITAL_COMMUNITY)
Admission: EM | Admit: 2017-11-01 | Discharge: 2017-11-03 | DRG: 069 | Disposition: A | Discharge status: Home / Self Care | Payer: PPO | Attending: Internal Medicine | Admitting: Internal Medicine

## 2017-11-01 DIAGNOSIS — J45909 Unspecified asthma, uncomplicated: Secondary | ICD-10-CM | POA: Diagnosis present

## 2017-11-01 DIAGNOSIS — I130 Hypertensive heart and chronic kidney disease with heart failure and stage 1 through stage 4 chronic kidney disease, or unspecified chronic kidney disease: Secondary | ICD-10-CM | POA: Diagnosis present

## 2017-11-01 DIAGNOSIS — E860 Dehydration: Secondary | ICD-10-CM | POA: Diagnosis not present

## 2017-11-01 DIAGNOSIS — N183 Chronic kidney disease, stage 3 (moderate): Secondary | ICD-10-CM | POA: Diagnosis not present

## 2017-11-01 DIAGNOSIS — Z87891 Personal history of nicotine dependence: Secondary | ICD-10-CM

## 2017-11-01 DIAGNOSIS — Z7951 Long term (current) use of inhaled steroids: Secondary | ICD-10-CM

## 2017-11-01 DIAGNOSIS — Z79899 Other long term (current) drug therapy: Secondary | ICD-10-CM

## 2017-11-01 DIAGNOSIS — G459 Transient cerebral ischemic attack, unspecified: Principal | ICD-10-CM | POA: Diagnosis present

## 2017-11-01 DIAGNOSIS — R0602 Shortness of breath: Secondary | ICD-10-CM | POA: Diagnosis not present

## 2017-11-01 DIAGNOSIS — N179 Acute kidney failure, unspecified: Secondary | ICD-10-CM | POA: Diagnosis not present

## 2017-11-01 DIAGNOSIS — I160 Hypertensive urgency: Secondary | ICD-10-CM | POA: Diagnosis present

## 2017-11-01 DIAGNOSIS — E785 Hyperlipidemia, unspecified: Secondary | ICD-10-CM | POA: Diagnosis present

## 2017-11-01 DIAGNOSIS — I5032 Chronic diastolic (congestive) heart failure: Secondary | ICD-10-CM | POA: Diagnosis not present

## 2017-11-01 DIAGNOSIS — I5030 Unspecified diastolic (congestive) heart failure: Secondary | ICD-10-CM | POA: Diagnosis not present

## 2017-11-01 DIAGNOSIS — I509 Heart failure, unspecified: Secondary | ICD-10-CM

## 2017-11-01 DIAGNOSIS — Z880 Allergy status to penicillin: Secondary | ICD-10-CM | POA: Diagnosis not present

## 2017-11-01 DIAGNOSIS — I10 Essential (primary) hypertension: Secondary | ICD-10-CM | POA: Diagnosis not present

## 2017-11-01 DIAGNOSIS — R42 Dizziness and giddiness: Secondary | ICD-10-CM | POA: Diagnosis not present

## 2017-11-01 DIAGNOSIS — R51 Headache: Secondary | ICD-10-CM | POA: Diagnosis not present

## 2017-11-01 LAB — COMPREHENSIVE METABOLIC PANEL
ALBUMIN: 3.5 g/dL (ref 3.5–5.0)
ALT: 21 U/L (ref 0–44)
ANION GAP: 10 (ref 5–15)
AST: 23 U/L (ref 15–41)
Alkaline Phosphatase: 99 U/L (ref 38–126)
BUN: 45 mg/dL — AB (ref 8–23)
CHLORIDE: 103 mmol/L (ref 98–111)
CO2: 21 mmol/L — AB (ref 22–32)
Calcium: 8.9 mg/dL (ref 8.9–10.3)
Creatinine, Ser: 2.57 mg/dL — ABNORMAL HIGH (ref 0.61–1.24)
GFR calc Af Amer: 25 mL/min — ABNORMAL LOW (ref >60)
GFR calc non Af Amer: 22 mL/min — ABNORMAL LOW (ref >60)
GLUCOSE: 128 mg/dL — AB (ref 70–99)
Potassium: 4.6 mmol/L (ref 3.5–5.1)
SODIUM: 134 mmol/L — AB (ref 135–145)
Total Bilirubin: 0.8 mg/dL (ref 0.3–1.2)
Total Protein: 6.3 g/dL — ABNORMAL LOW (ref 6.5–8.1)

## 2017-11-01 LAB — CBC
HEMATOCRIT: 41.2 % (ref 39.0–52.0)
HEMOGLOBIN: 13 g/dL (ref 13.0–17.0)
MCH: 26.5 pg (ref 26.0–34.0)
MCHC: 31.6 g/dL (ref 30.0–36.0)
MCV: 84.1 fL (ref 80.0–100.0)
Platelets: 379 10*3/uL (ref 150–400)
RBC: 4.9 MIL/uL (ref 4.22–5.81)
RDW: 14.9 % (ref 11.5–15.5)
WBC: 14.7 10*3/uL — AB (ref 4.0–10.5)
nRBC: 0 % (ref 0.0–0.2)

## 2017-11-01 LAB — BRAIN NATRIURETIC PEPTIDE: B Natriuretic Peptide: 123.5 pg/mL — ABNORMAL HIGH (ref 0.0–100.0)

## 2017-11-01 LAB — PROTIME-INR
INR: 1.05
Prothrombin Time: 13.6 seconds (ref 11.4–15.2)

## 2017-11-01 LAB — I-STAT CG4 LACTIC ACID, ED
LACTIC ACID, VENOUS: 1.08 mmol/L (ref 0.5–1.9)
Lactic Acid, Venous: 1.16 mmol/L (ref 0.5–1.9)

## 2017-11-01 LAB — CBG MONITORING, ED: GLUCOSE-CAPILLARY: 128 mg/dL — AB (ref 70–99)

## 2017-11-01 LAB — MAGNESIUM: MAGNESIUM: 2.1 mg/dL (ref 1.7–2.4)

## 2017-11-01 LAB — I-STAT TROPONIN, ED
TROPONIN I, POC: 0 ng/mL (ref 0.00–0.08)
Troponin i, poc: 0 ng/mL (ref 0.00–0.08)

## 2017-11-01 LAB — LIPASE, BLOOD: LIPASE: 62 U/L — AB (ref 11–51)

## 2017-11-01 LAB — TSH: TSH: 1.47 u[IU]/mL (ref 0.350–4.500)

## 2017-11-01 MED ORDER — LORAZEPAM 2 MG/ML IJ SOLN
1.0000 mg | Freq: Once | INTRAMUSCULAR | Status: AC
  Administered 2017-11-01: 1 mg via INTRAVENOUS
  Filled 2017-11-01: qty 1

## 2017-11-01 MED ORDER — ONDANSETRON HCL 4 MG PO TABS
4.0000 mg | ORAL_TABLET | Freq: Once | ORAL | Status: AC
  Administered 2017-11-01: 4 mg via ORAL
  Filled 2017-11-01: qty 1

## 2017-11-01 NOTE — ED Provider Notes (Signed)
[facility] EMERGENCY DEPARTMENT Provider Note   CSN: [id number] Arrival date & time: 11/01/17  1848     History   Chief Complaint Chief Complaint  Patient presents with  . Headache  . Dizziness  . Nausea    HPI Nicholas [name] is a 84 y.o. male.  The history is provided by the patient, a relative and medical records. No language interpreter was used.  Shortness of Breath  This is a new problem. The average episode lasts 4 days. The problem occurs continuously.The current episode started more than 2 days ago. The problem has been gradually worsening. Associated symptoms include headaches, vomiting and leg swelling. Pertinent negatives include no fever, no rhinorrhea, no neck pain, no cough, no wheezing, no chest pain, no abdominal pain, no rash and no leg pain. He has tried nothing for the symptoms. The treatment provided no relief. Associated medical issues include heart failure (new).    Past Medical History:  Diagnosis Date  . Anemia    none recent  . Arthritis   . Asthma   . Chronic kidney disease    ckd stage 3  . Family history of adverse reaction to anesthesia    daughter has ponv   . Hypertension     There are no active problems to display for this patient.   Past Surgical History:  Procedure Laterality Date  . COLONOSCOPY WITH PROPOFOL N/A 02/03/2015   Procedure: COLONOSCOPY WITH PROPOFOL;  Surgeon: Arta Silence, MD;  Location: [facility];  Service: Endoscopy;  Laterality: N/A;  . KNEE ARTHROSCOPY Left   . ROTATOR CUFF REPAIR Right   . SHOULDER ARTHROSCOPY Left         Home Medications    Prior to Admission medications   Medication Sig Start Date End Date Taking? Authorizing Provider  acetaminophen (TYLENOL) 500 MG tablet Take 1,000 mg by mouth every 6 (six) hours as needed for moderate pain.    [provider]  albuterol (PROVENTIL HFA;VENTOLIN HFA) 108 (90 Base) MCG/ACT inhaler Inhale 1 puff into the lungs 2 (two)  times daily.    [provider]  aspirin EC 81 MG tablet Take 81 mg by mouth every morning.    [provider]  bisoprolol (ZEBETA) 5 MG tablet Take 5 mg by mouth every morning.    [provider]  Calcium Carb-Cholecalciferol (CALCIUM 600 + D PO) Take 2 tablets by mouth every morning.    [provider]  febuxostat (ULORIC) 40 MG tablet Take 40 mg by mouth every morning.    [provider]  Fluticasone-Salmeterol (ADVAIR) 250-50 MCG/DOSE AEPB Inhale 1 puff into the lungs 2 (two) times daily.    [provider]  ibuprofen (ADVIL,MOTRIN) 200 MG tablet Take 400 mg by mouth every 6 (six) hours as needed for moderate pain.    [provider]  losartan (COZAAR) 100 MG tablet Take 100 mg by mouth every morning.    [provider]  Multiple Vitamin (MULTIVITAMIN WITH MINERALS) TABS tablet Take 1 tablet by mouth every morning.    [provider]  Omega-3 Fatty Acids (FISH OIL PO) Take 3 capsules by mouth every morning.    [provider]  omeprazole (PRILOSEC) 20 MG capsule Take 20 mg by mouth every morning.    [provider]    Family History History reviewed. No pertinent family history.  Social History Social History   Tobacco Use  . Smoking status: Former Research scientist (life sciences)  . Smokeless tobacco:  Never Used  . Tobacco comment: quit 35 yrs ago  Substance Use Topics  . Alcohol use: No  . Drug use: No     Allergies   Penicillins   Review of Systems Review of Systems  Constitutional: Positive for diaphoresis and fatigue. Negative for chills and fever.  HENT: Negative for congestion and rhinorrhea.   Eyes: Positive for photophobia. Negative for visual disturbance.  Respiratory: Positive for shortness of breath. Negative for cough, choking, chest tightness and wheezing.   Cardiovascular: Positive for leg swelling. Negative for chest pain and palpitations.  Gastrointestinal: Positive for nausea and  vomiting. Negative for abdominal distention, abdominal pain, constipation and diarrhea.  Genitourinary: Positive for decreased urine volume.  Musculoskeletal: Negative for back pain, neck pain and neck stiffness.  Skin: Negative for rash and wound.  Neurological: Positive for weakness (resolved), light-headedness and headaches. Negative for dizziness, seizures and speech difficulty.  Hematological: Negative for adenopathy.  Psychiatric/Behavioral: Negative for agitation and confusion.  All other systems reviewed and are negative.    Physical Exam Updated Vital Signs BP 129/80 (BP Location: Left Arm)   Pulse 84   Temp 97.8 F (36.6 C) (Axillary)   Resp 20   SpO2 97%   Physical Exam  Constitutional: He is oriented to person, place, and time. He appears well-developed and well-nourished.  Non-toxic appearance. He does not appear ill. No distress.  HENT:  Head: Normocephalic and atraumatic.  Right Ear: External ear normal.  Left Ear: External ear normal.  Nose: Nose normal.  Mouth/Throat: Oropharynx is clear and moist. No oropharyngeal exudate.  Eyes: Pupils are equal, round, and reactive to light. Conjunctivae and EOM are normal.  Neck: Normal range of motion. Neck supple.  Cardiovascular: Normal rate and intact distal pulses.  No murmur heard. Pulmonary/Chest: No stridor. No respiratory distress. He has rales. He exhibits no tenderness.  Abdominal: Soft. There is no tenderness. There is no rebound and no guarding.  Musculoskeletal: He exhibits edema. He exhibits no tenderness.  Neurological: He is alert and oriented to person, place, and time. He displays normal reflexes. No cranial nerve deficit or sensory deficit. He exhibits normal muscle tone. Coordination normal.  Skin: Skin is warm. Capillary refill takes less than 2 seconds. No rash noted. He is not diaphoretic. No erythema. No pallor.  Psychiatric: He has a normal mood and affect.  Nursing note and vitals  reviewed.    ED Treatments / Results  Labs (all labs ordered are listed, but only abnormal results are displayed) Labs Reviewed  CBC - Abnormal; Notable for the following components:      Result Value   WBC 14.7 (*)    All other components within normal limits  COMPREHENSIVE METABOLIC PANEL - Abnormal; Notable for the following components:   Sodium 134 (*)    CO2 21 (*)    Glucose, Bld 128 (*)    BUN 45 (*)    Creatinine, Ser 2.57 (*)    Total Protein 6.3 (*)    GFR calc non Af Amer 22 (*)    GFR calc Af Amer 25 (*)    All other components within normal limits  BRAIN NATRIURETIC PEPTIDE - Abnormal; Notable for the following components:   B Natriuretic Peptide 123.5 (*)    All other components within normal limits  LIPASE, BLOOD - Abnormal; Notable for the following components:   Lipase 62 (*)    All other components within normal limits  CBG MONITORING, ED - Abnormal; Notable for the  following components:   Glucose-Capillary 128 (*)    All other components within normal limits  URINE CULTURE  URINALYSIS, ROUTINE W REFLEX MICROSCOPIC  TSH  PROTIME-INR  MAGNESIUM  I-STAT CG4 LACTIC ACID, ED  I-STAT TROPONIN, ED  I-STAT CG4 LACTIC ACID, ED  I-STAT TROPONIN, ED    EKG EKG Interpretation  Date/Time:  Thursday November 01 2017 18:55:07 EDT Ventricular Rate:  64 PR Interval:  180 QRS Duration: 148 QT Interval:  460 QTC Calculation: 474 R Axis:   -33 Text Interpretation:  Sinus rhythm with Possible Premature atrial complexes with Abberant conduction Left axis deviation Right bundle branch block Possible Lateral infarct , age undetermined Abnormal ECG When compared to prior, new PVC./  No STEMI Confirmed by Antony Blackbird [phone]) on 11/01/2017 7:51:56 PM   Radiology Dg Chest 2 View  Result Date: 11/01/2017 CLINICAL DATA:  Shortness of breath EXAM: CHEST - 2 VIEW COMPARISON:  10/25/2017, 03/30/2017 FINDINGS: Right shoulder replacement. Cardiomegaly with vascular  congestion. No pleural effusion. No focal consolidation. Aortic atherosclerosis. No pneumothorax. Surgical anchors in the left humeral head. IMPRESSION: Cardiomegaly with mild vascular congestion Electronically Signed   By: Donavan Foil M.D.   On: 11/01/2017 22:48   Ct Head Wo Contrast  Result Date: 11/01/2017 CLINICAL DATA:  Headache, hypertension EXAM: CT HEAD WITHOUT CONTRAST TECHNIQUE: Contiguous axial images were obtained from the base of the skull through the vertex without intravenous contrast. COMPARISON:  10/25/2017 FINDINGS: Brain: Retro cerebellar CSF accumulation again noted is stable since prior study. No hemorrhage, hydrocephalus or acute infarction. Vascular: No hyperdense vessel or unexpected calcification. Skull: No acute calvarial abnormality. Sinuses/Orbits: No acute finding Other: None IMPRESSION: No acute intracranial abnormality or change since prior study. Electronically Signed   By: Rolm Baptise M.D.   On: 11/01/2017 22:09   Mr Brain Wo Contrast  Result Date: 11/01/2017 CLINICAL DATA:  Episodic headache and dizziness since yesterday. History of hypertension and CHF. EXAM: MRI HEAD WITHOUT CONTRAST TECHNIQUE: Multiplanar, multiecho pulse sequences of the brain and surrounding structures were obtained without intravenous contrast. COMPARISON:  CT HEAD November 01, 2017 and MRI head March 30, 2017 FINDINGS: Mild motion degraded examination. INTRACRANIAL CONTENTS: No reduced diffusion to suggest acute ischemia. No susceptibility artifact to suggest hemorrhage. The ventricles and sulci are normal for patient's age. A few scattered subcentimeter supratentorial white matter FLAIR T2 hyperintensities compatible with mild chronic small vessel ischemic changes, less than expected for age. No suspicious parenchymal signal, masses, mass effect. No abnormal extra-axial fluid collections. Prominent posterior fossa extra-axial spaces. No extra-axial masses. VASCULAR: Normal major intracranial  vascular flow voids present at skull base. SKULL AND UPPER CERVICAL SPINE: No abnormal sellar expansion. No suspicious calvarial bone marrow signal. Craniocervical junction maintained. SINUSES/ORBITS: Mild paranasal sinus mucosal thickening without air-fluid levels. Mastoid air cells are well aerated. The included ocular globes and orbital contents are non-suspicious. OTHER: Patient is edentulous. IMPRESSION: 1. Mild motion degraded examination.  No acute intracranial process. 2. 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 Electronically Signed   By: Elon Alas M.D.   On: 11/01/2017 23:07    Procedures Procedures (including critical care time)  Medications Ordered in ED Medications  ondansetron (ZOFRAN) tablet 4 mg (4 mg Oral Given 11/01/17 1911)  LORazepam (ATIVAN) injection 1 mg (1 mg Intravenous Given 11/01/17 2207)     Initial Impression / Assessment and Plan / ED Course  I have reviewed the triage  vital signs and the nursing notes.  Pertinent labs & imaging results that were available during my care of the patient were reviewed by me and considered in my medical decision making (see chart for details).     Nicholas [name] is a 84 y.o. male with a past medical history significant for hypertension, asthma, CKD, and recent diagnosis of congestive heart failure last week who presents with several complaints including worsening fatigue, shortness of breath, intermittent peripheral edema, reportedly worsened kidney function, severe headache, and transient right hand weakness.  Patient reports that last week he was diagnosed with congestive heart failure at [facility] and was discharged.  He reports that he was taking diuretics and blood pressure medicine for elevated blood pressures at that admission.  He reports that he has had worsening fatigue over the last several days and shortness of  breath.  He says that over the last 2 days he has been having headaches and today had worsened headache with nausea, vomiting, and dry heaving.  He reports that yesterday after standing up, he had several minutes of right hand weakness.  He has never had this before.  He is right-handed.  He reports no vision changes.  He describes no neck pain, neck stiffness, back pain, or extremity pains.  He reports that his urine has decreased over the last several days.  He says that his legs have been symmetrically edematous and it seems to come and go.  He had blood work done yesterday at his PCPs office and was told that his kidney function has drastically worsened.  The lab work performed yesterday does not appear to be in care everywhere.  Next  Due to patient's fatigue, headache, shortness of breath, decreased urine and the transient right hand weakness yesterday, patient and family decided to come to the emergency department for evaluation.  They are very concerned about what is going on.  He reports that during his admission during last visit he would have times when his heart rate went fast and then went went extremely slow into the 30s.  Next  On arrival, patient's heart rate is in the 60s however he is having frequent PVCs and his pulse when palpated has been into the 30s at times with the numerous PVCs.  On exam, lungs have crackles in the bases.  Chest and back are nontender.  Abdomen is nontender.  Patient had no focal neurologic deficits with symmetric grip strength, normal sensation, and normal coordination with finger-nose-finger testing.  Pupils are reactive with normal extraocular movements.  No facial droop.  Clear speech.  Family reports that patient's blood pressure has been elevated at times this week with blood pressures up into the 190s.  Patient's blood pressure is normotensive at this time.    Given the headache, transient neurologic deficit, and severe headache with elevated blood pressure  reportedly, patient will have imaging including head CT.  Patient will have MRI of the brain given the transient and weakness yesterday.  Anticipate admission.  10:56 PM Patient's lab testing shows evidence of acute kidney injury with a creatinine of 2.57.  Unknown prior however he reportedly was told his kidney function is rising rapidly yesterday.  Given his new heart failure diagnosis, the fluid on his lungs on exam, the edema in his legs, his lability with blood pressure, and the kidney injury, patient will be admitted for further management.  Patient still awaiting MRI results to determine if he had a stroke causing the transient weakness of  his right hand yesterday.    Patient will be admitted to medicine for further management.   Final Clinical Impressions(s) / ED Diagnoses   Final diagnoses:  AKI (acute kidney injury) [facility])    ED Discharge Orders    None     Clinical Impression: 1. AKI (acute kidney injury) ([place])     Disposition: Admit  This note was prepared with assistance of Dragon voice recognition software. Occasional wrong-word or sound-a-like substitutions may have occurred due to the inherent limitations of voice recognition software.         Bearett Porcaro, Gwenyth Allegra, MD 11/02/17 [phone]

## 2017-11-01 NOTE — ED Triage Notes (Signed)
Pt was discharged from [place] and had new diagnosis of CHF.  Had dizzy spells and headache yesterday.  Symptoms resolved then began again today.  A&Ox4 with frontal headache.  Mild nausea and vomiting today.  Hx of CKD 3.

## 2017-11-01 NOTE — ED Notes (Signed)
Pt very dizzy in triage, unable to get urine at this time.

## 2017-11-01 NOTE — ED Notes (Signed)
Family reports pt began to shake uncontrollably in lobby. Entire body. Large movements. Lasting about a minute. Corresponded with left temporal pain/headache. New Symptom.

## 2017-11-02 ENCOUNTER — Inpatient Hospital Stay (HOSPITAL_COMMUNITY): Payer: PPO

## 2017-11-02 DIAGNOSIS — E785 Hyperlipidemia, unspecified: Secondary | ICD-10-CM | POA: Diagnosis present

## 2017-11-02 DIAGNOSIS — G459 Transient cerebral ischemic attack, unspecified: Secondary | ICD-10-CM

## 2017-11-02 DIAGNOSIS — I5030 Unspecified diastolic (congestive) heart failure: Secondary | ICD-10-CM

## 2017-11-02 LAB — CBC
HCT: 39.4 % (ref 39.0–52.0)
HEMOGLOBIN: 12.4 g/dL — AB (ref 13.0–17.0)
MCH: 26.1 pg (ref 26.0–34.0)
MCHC: 31.5 g/dL (ref 30.0–36.0)
MCV: 82.8 fL (ref 80.0–100.0)
Platelets: 334 10*3/uL (ref 150–400)
RBC: 4.76 MIL/uL (ref 4.22–5.81)
RDW: 14.8 % (ref 11.5–15.5)
WBC: 12.4 10*3/uL — ABNORMAL HIGH (ref 4.0–10.5)
nRBC: 0 % (ref 0.0–0.2)

## 2017-11-02 LAB — COMPREHENSIVE METABOLIC PANEL
ALBUMIN: 3.2 g/dL — AB (ref 3.5–5.0)
ALT: 19 U/L (ref 0–44)
AST: 18 U/L (ref 15–41)
Alkaline Phosphatase: 90 U/L (ref 38–126)
Anion gap: 12 (ref 5–15)
BILIRUBIN TOTAL: 0.7 mg/dL (ref 0.3–1.2)
BUN: 42 mg/dL — ABNORMAL HIGH (ref 8–23)
CO2: 21 mmol/L — AB (ref 22–32)
Calcium: 9 mg/dL (ref 8.9–10.3)
Chloride: 103 mmol/L (ref 98–111)
Creatinine, Ser: 2.33 mg/dL — ABNORMAL HIGH (ref 0.61–1.24)
GFR calc Af Amer: 29 mL/min — ABNORMAL LOW (ref >60)
GFR calc non Af Amer: 25 mL/min — ABNORMAL LOW (ref >60)
GLUCOSE: 112 mg/dL — AB (ref 70–99)
POTASSIUM: 4.3 mmol/L (ref 3.5–5.1)
SODIUM: 136 mmol/L (ref 135–145)
TOTAL PROTEIN: 5.8 g/dL — AB (ref 6.5–8.1)

## 2017-11-02 LAB — URINALYSIS, ROUTINE W REFLEX MICROSCOPIC
Bilirubin Urine: NEGATIVE
GLUCOSE, UA: NEGATIVE mg/dL
Hgb urine dipstick: NEGATIVE
KETONES UR: NEGATIVE mg/dL
LEUKOCYTES UA: NEGATIVE
Nitrite: NEGATIVE
PH: 5 (ref 5.0–8.0)
Protein, ur: NEGATIVE mg/dL
Specific Gravity, Urine: 1.014 (ref 1.005–1.030)

## 2017-11-02 LAB — TSH: TSH: 1.149 u[IU]/mL (ref 0.350–4.500)

## 2017-11-02 LAB — ECHOCARDIOGRAM COMPLETE

## 2017-11-02 MED ORDER — ASPIRIN EC 81 MG PO TBEC
81.0000 mg | DELAYED_RELEASE_TABLET | Freq: Every morning | ORAL | Status: DC
  Administered 2017-11-02 – 2017-11-03 (×2): 81 mg via ORAL
  Filled 2017-11-02 (×2): qty 1

## 2017-11-02 MED ORDER — OMEGA-3-ACID ETHYL ESTERS 1 G PO CAPS
1.0000 g | ORAL_CAPSULE | Freq: Two times a day (BID) | ORAL | Status: DC
  Administered 2017-11-02 – 2017-11-03 (×3): 1 g via ORAL
  Filled 2017-11-02 (×3): qty 1

## 2017-11-02 MED ORDER — MOMETASONE FURO-FORMOTEROL FUM 200-5 MCG/ACT IN AERO
2.0000 | INHALATION_SPRAY | Freq: Two times a day (BID) | RESPIRATORY_TRACT | Status: DC
  Administered 2017-11-02 – 2017-11-03 (×3): 2 via RESPIRATORY_TRACT
  Filled 2017-11-02: qty 8.8

## 2017-11-02 MED ORDER — ADULT MULTIVITAMIN W/MINERALS CH
1.0000 | ORAL_TABLET | Freq: Every morning | ORAL | Status: DC
  Administered 2017-11-02 – 2017-11-03 (×2): 1 via ORAL
  Filled 2017-11-02 (×2): qty 1

## 2017-11-02 MED ORDER — BISOPROLOL FUMARATE 5 MG PO TABS
2.5000 mg | ORAL_TABLET | Freq: Every morning | ORAL | Status: DC
  Administered 2017-11-02 – 2017-11-03 (×2): 2.5 mg via ORAL
  Filled 2017-11-02 (×2): qty 0.5

## 2017-11-02 MED ORDER — LOSARTAN POTASSIUM 50 MG PO TABS
100.0000 mg | ORAL_TABLET | Freq: Every morning | ORAL | Status: DC
  Administered 2017-11-02: 100 mg via ORAL
  Filled 2017-11-02 (×2): qty 2

## 2017-11-02 MED ORDER — ALBUTEROL SULFATE (2.5 MG/3ML) 0.083% IN NEBU: 3.0000 mL | INHALATION_SOLUTION | RESPIRATORY_TRACT | Status: DC | PRN

## 2017-11-02 MED ORDER — AMLODIPINE BESYLATE 5 MG PO TABS
5.0000 mg | ORAL_TABLET | Freq: Every day | ORAL | Status: DC
  Administered 2017-11-02 – 2017-11-03 (×2): 5 mg via ORAL
  Filled 2017-11-02 (×2): qty 1

## 2017-11-02 MED ORDER — FEBUXOSTAT 40 MG PO TABS
40.0000 mg | ORAL_TABLET | Freq: Every morning | ORAL | Status: DC
  Administered 2017-11-02 – 2017-11-03 (×2): 40 mg via ORAL
  Filled 2017-11-02 (×2): qty 1

## 2017-11-02 MED ORDER — ACETAMINOPHEN 500 MG PO TABS
500.0000 mg | ORAL_TABLET | Freq: Three times a day (TID) | ORAL | Status: DC | PRN
  Administered 2017-11-02: 1000 mg via ORAL
  Filled 2017-11-02: qty 2

## 2017-11-02 MED ORDER — PANTOPRAZOLE SODIUM 40 MG PO TBEC
40.0000 mg | DELAYED_RELEASE_TABLET | Freq: Every day | ORAL | Status: DC
  Administered 2017-11-02 – 2017-11-03 (×2): 40 mg via ORAL
  Filled 2017-11-02 (×2): qty 1

## 2017-11-02 MED ORDER — HYDRALAZINE HCL 25 MG PO TABS
25.0000 mg | ORAL_TABLET | Freq: Three times a day (TID) | ORAL | Status: DC
  Administered 2017-11-02: 25 mg via ORAL
  Filled 2017-11-02 (×2): qty 1

## 2017-11-02 MED ORDER — ONDANSETRON HCL 4 MG PO TABS: 4.0000 mg | ORAL_TABLET | Freq: Four times a day (QID) | ORAL | Status: DC | PRN

## 2017-11-02 MED ORDER — HEPARIN SODIUM (PORCINE) 5000 UNIT/ML IJ SOLN
5000.0000 [IU] | Freq: Three times a day (TID) | INTRAMUSCULAR | Status: DC
  Administered 2017-11-02 – 2017-11-03 (×4): 5000 [IU] via SUBCUTANEOUS
  Filled 2017-11-02 (×4): qty 1

## 2017-11-02 MED ORDER — ONDANSETRON HCL 4 MG/2ML IJ SOLN: 4.0000 mg | Freq: Four times a day (QID) | INTRAMUSCULAR | Status: DC | PRN

## 2017-11-02 MED ORDER — FUROSEMIDE 10 MG/ML IJ SOLN
20.0000 mg | Freq: Two times a day (BID) | INTRAMUSCULAR | Status: DC
  Administered 2017-11-02 (×2): 20 mg via INTRAVENOUS
  Filled 2017-11-02 (×2): qty 4

## 2017-11-02 MED ORDER — ALBUTEROL SULFATE (2.5 MG/3ML) 0.083% IN NEBU
2.5000 mg | INHALATION_SOLUTION | Freq: Two times a day (BID) | RESPIRATORY_TRACT | Status: DC
  Administered 2017-11-02 – 2017-11-03 (×3): 2.5 mg via RESPIRATORY_TRACT
  Filled 2017-11-02 (×3): qty 3

## 2017-11-02 MED ORDER — CALCIUM CARBONATE-VITAMIN D 500-200 MG-UNIT PO TABS
1.0000 | ORAL_TABLET | Freq: Every day | ORAL | Status: DC
  Administered 2017-11-02 – 2017-11-03 (×2): 1 via ORAL
  Filled 2017-11-02 (×2): qty 1

## 2017-11-02 MED ORDER — LATANOPROST 0.005 % OP SOLN
1.0000 [drp] | Freq: Every day | OPHTHALMIC | Status: DC
  Administered 2017-11-02: 1 [drp] via OPHTHALMIC
  Filled 2017-11-02: qty 2.5

## 2017-11-02 NOTE — Evaluation (Signed)
Physical Therapy Evaluation Patient Details Name: Nicholas [name] MRN: [id number] DOB: 09-16-1933 Today's Date: 11/02/2017   History of Present Illness  84 y.o. male with medical history significant of newly diagnosed congestive heart failure apparently at [place] last week, hypertension, hyperlipidemia and chronic kidney disease stage III who apparently was placed on diuretics after new diagnosis of CHF only a week ago.  Echo results not available to [place] at this point not sure if he was diagnosed with diastolic or systolic dysfunction.  Patient said he started feeling weak and tired today.  Came to the ER where he was seen and evaluated.  He had a spike in his blood pressure earlier in the day into the 190s associated with transient numbness of his right hand.  He was unable to tell or use her arm at that point.  This has since resolved.  Blood pressure appears much better control here.  Patient however felt dizzy and diaphoretic.  He has had some headaches also.  At this point suspected TIA with hypertensive emergency in the setting of new diagnosis of CHF although per family his last Echo was in march this year and he had evidence of CHF then, they were not told until last week. Baseline creatinine reported as less than 2.0 by family.  He appears more dehydrated probably from excessive diuresis.  Clinical Impression  Pt appears at baseline level of functioning, supervision/mod I with all mobility, gait and transfers.  Pt's family present and agrees that pt is at baseline level of functioning and will have 24 hour supervision at home.  No further acute PT needs.    Follow Up Recommendations No PT follow up    Equipment Recommendations  None recommended by PT    Recommendations for Other Services       Precautions / Restrictions Precautions Precautions: None Restrictions Weight Bearing Restrictions: No      Mobility  Bed Mobility Overal bed mobility: Modified Independent                Transfers Overall transfer level: Modified independent                  Ambulation/Gait Ambulation/Gait assistance: Supervision Gait Distance (Feet): 200 Feet Assistive device: None       General Gait Details: pt with DOE but no LOB during gait training  Stairs            Wheelchair Mobility    Modified Rankin (Stroke Patients Only)       Balance Overall balance assessment: Modified Independent                                           Pertinent Vitals/Pain Pain Assessment: No/denies pain    Home Living Family/patient expects to be discharged to:: Private residence Living Arrangements: Children Available Help at Discharge: Family Type of Home: House Home Access: Ramped entrance     Home Layout: One level Home Equipment: None      Prior Function Level of Independence: Independent               Hand Dominance        Extremity/Trunk Assessment   Upper Extremity Assessment Upper Extremity Assessment: Generalized weakness    Lower Extremity Assessment Lower Extremity Assessment: Generalized weakness    Cervical / Trunk Assessment Cervical / Trunk Assessment: Normal  Communication   Communication:  No difficulties  Cognition Arousal/Alertness: Awake/alert Behavior During Therapy: WFL for tasks assessed/performed Overall Cognitive Status: Within Functional Limits for tasks assessed                                        General Comments      Exercises     Assessment/Plan    PT Assessment Patent does not need any further PT services  PT Problem List         PT Treatment Interventions      PT Goals (Current goals can be found in the Care Plan section)  Acute Rehab PT Goals PT Goal Formulation: All assessment and education complete, DC therapy    Frequency     Barriers to discharge        Co-evaluation               AM-PAC PT "6 Clicks" Daily Activity  Outcome  Measure Difficulty turning over in bed (including adjusting bedclothes, sheets and blankets)?: A Little Difficulty moving from lying on back to sitting on the side of the bed? : A Little Difficulty sitting down on and standing up from a chair with arms (e.g., wheelchair, bedside commode, etc,.)?: A Little Help needed moving to and from a bed to chair (including a wheelchair)?: A Little Help needed walking in hospital room?: A Little Help needed climbing 3-5 steps with a railing? : A Little 6 Click Score: 18    End of Session   Activity Tolerance: Patient tolerated treatment well Patient left: in bed;with family/visitor present;with bed alarm set Nurse Communication: Mobility status      Time: 5093-2671 PT Time Calculation (min) (ACUTE ONLY): 10 min   Charges:   PT Evaluation $PT Eval Low Complexity: [place], PT, DPT  Karlton Maya 11/02/2017, 1:11 PM

## 2017-11-02 NOTE — Progress Notes (Signed)
  Echocardiogram 2D Echocardiogram has been performed.  Madelaine Etienne 11/02/2017, 1:50 PM

## 2017-11-02 NOTE — Progress Notes (Signed)
Nicholas [name] is a 84 y.o. male with medical history significant of newly diagnosed congestive heart failure apparently at [place] last week, hypertension, hyperlipidemia and chronic kidney disease stage III who apparently was placed on diuretics after new diagnosis of CHF only a week ago comes in for dizziness, nausea and weakness.   He was admitted for evaluation of TIA.  MRI is negative.  Echo is pending.  Carotid duplex does not show sig stenosis.  Therapy evals recommended no follow up .    Plan: Await echocardiogram results.  Check renal parameters in am.    Hosie Poisson, MD [phone]

## 2017-11-02 NOTE — Care Management Note (Signed)
Case Management Note  Patient Details  Name: Nicholas [name] MRN: [id number] Date of Birth: 1933/07/10  Subjective/Objective:     Pt in with TIA. He is from home with his son. Pt [facility] at [facility] with CHF.  No DME at home. No issues obtaining his medications.  Son provides transportation.  Pt has 24 hour supervision between his son and sister.                Action/Plan: No f/u per PT and no DME. CM following for d/c needs, physician orders.   Expected Discharge Date:                  Expected Discharge Plan:     In-House Referral:     Discharge planning Services     Post Acute Care Choice:    Choice offered to:     DME Arranged:    DME Agency:     HH Arranged:    HH Agency:     Status of Service:  In process, will continue to follow  If discussed at Long Length of Stay Meetings, dates discussed:    Additional Comments:  Pollie Friar, RN 11/02/2017, 3:46 PM

## 2017-11-02 NOTE — H&P (Addendum)
History and Physical   Nicholas [name] OIB:[id number] DOB: July 13, 1933 DOA: 11/01/2017  Referring MD/NP/PA: Dr. Sherry Ruffing  PCP: [facility], [name], MD   Outpatient Specialists: None  Patient coming from: Home  Chief Complaint: Dizziness nausea and headaches  HPI: Nicholas [name] is a 84 y.o. male with medical history significant of newly diagnosed congestive heart failure apparently at [place] last week, hypertension, hyperlipidemia and chronic kidney disease stage III who apparently was placed on diuretics after new diagnosis of CHF only a week ago.  Echo results not available to [place] at this point not sure if he was diagnosed with diastolic or systolic dysfunction.  Patient said he started feeling weak and tired today.  Came to the ER where he was seen and evaluated.  He had a spike in his blood pressure earlier in the day into the 190s associated with transient numbness of his right hand.  He was unable to tell or use her arm at that point.  This has since resolved.  Blood pressure appears much better control here.  Patient however felt dizzy and diaphoretic.  He has had some headaches also.  At this point suspected TIA with hypertensive emergency in the setting of new diagnosis of CHF although per family his last Echo was in march this year and he had evidence of CHF then, they were not told until last week. Baseline creatinine reported as less than 2.0 by family.  He appears more dehydrated probably from excessive diuresis.  Patient is therefore being admitted for evaluation.  ED Course: Temperature is 9078 blood pressure 128/54 pulse 58 respiratory rate of 20 oxygen sat 91% on room air.  White count is 14.7 with hemoglobin 13 and platelets 379.  Sodium is 134 potassium 4.6 chloride 103 CO2 21 with BUN 45 and creatinine 2.57 glucose 128 lactic acid 1.16.  Urinalysis is so far negative.  Chest x-ray showed cardiomegaly with mild vascular congestion.  Head CT without contrast showed no acute  findings.  MRI showed no acute findings.  He has chronic small vessel ischemic changes and large posterior fossa arachnoid cyst.  EKG showed sinus rhythm with a rate of 61 and right bundle branch block.  Patient is being admitted for observation and work-up.  Review of Systems: As per HPI otherwise 10 point review of systems negative.    Past Medical History:  Diagnosis Date  . Anemia    none recent  . Arthritis   . Asthma   . Chronic kidney disease    ckd stage 3  . Family history of adverse reaction to anesthesia    daughter has ponv   . Hypertension     Past Surgical History:  Procedure Laterality Date  . COLONOSCOPY WITH PROPOFOL N/A 02/03/2015   Procedure: COLONOSCOPY WITH PROPOFOL;  Surgeon: Arta Silence, MD;  Location: [facility];  Service: Endoscopy;  Laterality: N/A;  . KNEE ARTHROSCOPY Left   . ROTATOR CUFF REPAIR Right   . SHOULDER ARTHROSCOPY Left      reports that he has quit smoking. He has never used smokeless tobacco. He reports that he does not drink alcohol or use drugs.  Allergies  Allergen Reactions  . Penicillins Hives    Childhood allergy Has patient had a PCN reaction causing immediate rash, facial/tongue/throat swelling, SOB or lightheadedness with hypotension: Yes Has patient had a PCN reaction causing severe rash involving mucus membranes or skin necrosis: Yes Has patient had a PCN reaction that required hospitalization: Yes Has patient had  a PCN reaction occurring within the last 10 years: No If all of the above answers are "NO", then may proceed with Cephalosporin use.     History reviewed. No pertinent family history.   Prior to Admission medications   Medication Sig Start Date End Date Taking? Authorizing Provider  acetaminophen (TYLENOL) 500 MG tablet Take 500-1,000 mg by mouth every 8 (eight) hours as needed (for pain or headaches).    Yes [provider]  albuterol (PROVENTIL HFA;VENTOLIN HFA) 108 (90 Base) MCG/ACT inhaler  Inhale 2 puffs into the lungs See admin instructions. Inhale 2 puffs into the lungs in the morning and 2 puffs at bedtime and may be used every 4 hours as needed for shortness of breath or wheezing   Yes [provider]  amLODipine (NORVASC) 10 MG tablet Take 5 mg by mouth daily. 10/26/17  Yes [provider]  aspirin EC 81 MG tablet Take 81 mg by mouth every morning.   Yes [provider]  bisoprolol (ZEBETA) 5 MG tablet Take 2.5 mg by mouth every morning.    Yes [provider]  Calcium Carb-Cholecalciferol (CALCIUM 600+D3 PO) Take 2 tablets by mouth daily.   Yes [provider]  febuxostat (ULORIC) 40 MG tablet Take 40 mg by mouth every morning.   Yes [provider]  Fluticasone-Salmeterol (ADVAIR) 250-50 MCG/DOSE AEPB Inhale 1 puff into the lungs 2 (two) times daily.   Yes [provider]  furosemide (LASIX) 20 MG tablet Take 20 mg by mouth daily.   Yes [provider]  hydrALAZINE (APRESOLINE) 25 MG tablet Take 25 mg by mouth 3 (three) times daily.   Yes [provider]  latanoprost (XALATAN) 0.005 % ophthalmic solution Place 1 drop into both eyes at bedtime. 07/26/17  Yes [provider]  losartan (COZAAR) 100 MG tablet Take 100 mg by mouth every morning.   Yes [provider]  Multiple Vitamin (MULTIVITAMIN WITH MINERALS) TABS tablet Take 1 tablet by mouth every morning.   Yes [provider]  Omega-3 Fatty Acids (FISH OIL) 1000 MG CAPS Take 1,000 mg by mouth daily.   Yes [provider]  omeprazole (PRILOSEC) 20 MG capsule Take 20 mg by mouth every morning.   Yes [provider]    Physical Exam: Vitals:   11/01/17 2000 11/01/17 2045 11/01/17 2115 11/01/17 2200  BP: (!) 128/54 128/60 140/77 139/86  Pulse: (!) 58 (!) 50 79 87  Resp:  16 17 19   Temp:      TempSrc:      SpO2: 96% 91% 96% 94%      Constitutional: NAD, calm, comfortable Vitals:   11/01/17  2000 11/01/17 2045 11/01/17 2115 11/01/17 2200  BP: (!) 128/54 128/60 140/77 139/86  Pulse: (!) 58 (!) 50 79 87  Resp:  16 17 19   Temp:      TempSrc:      SpO2: 96% 91% 96% 94%   Eyes: PERRL, lids and conjunctivae normal ENMT: Mucous membranes are moist. Posterior pharynx clear of any exudate or lesions.Normal dentition.  Neck: normal, supple, no masses, no thyromegaly Respiratory: clear to auscultation bilaterally, no wheezing, no crackles. Normal respiratory effort. No accessory muscle use.  Cardiovascular: Regular rate and rhythm, no murmurs / rubs / gallops. No extremity edema. 2+ pedal pulses. No carotid bruits.  Abdomen: no tenderness, no masses palpated. No hepatosplenomegaly. Bowel sounds positive.  Musculoskeletal: no clubbing / cyanosis. No joint deformity upper and lower extremities. Good ROM, no  contractures. Normal muscle tone.  Skin: no rashes, lesions, ulcers. No induration Neurologic: CN 2-12 grossly intact. Sensation intact, DTR normal. Strength 5/5 in all 4.  Psychiatric: Normal judgment and insight. Alert and oriented x 3. Normal mood.     Labs on Admission: I have personally reviewed following labs and imaging studies  CBC: Recent Labs  Lab 11/01/17 1929  WBC 14.7*  HGB 13.0  HCT 41.2  MCV 84.1  PLT 287   Basic Metabolic Panel: Recent Labs  Lab 11/01/17 1929 11/01/17 2105  NA 134*  --   K 4.6  --   CL 103  --   CO2 21*  --   GLUCOSE 128*  --   BUN 45*  --   CREATININE 2.57*  --   CALCIUM 8.9  --   MG  --  2.1   GFR: CrCl cannot be calculated (Unknown ideal weight.). Liver Function Tests: Recent Labs  Lab 11/01/17 1929  AST 23  ALT 21  ALKPHOS 99  BILITOT 0.8  PROT 6.3*  ALBUMIN 3.5   Recent Labs  Lab 11/01/17 2105  LIPASE 62*   No results for input(s): AMMONIA in the last 168 hours. Coagulation Profile: Recent Labs  Lab 11/01/17 2105  INR 1.05   Cardiac Enzymes: No results for input(s): CKTOTAL, CKMB, CKMBINDEX, TROPONINI  in the last 168 hours. BNP (last 3 results) No results for input(s): PROBNP in the last 8760 hours. HbA1C: No results for input(s): HGBA1C in the last 72 hours. CBG: Recent Labs  Lab 11/01/17 2014  GLUCAP 128*   Lipid Profile: No results for input(s): CHOL, HDL, LDLCALC, TRIG, CHOLHDL, LDLDIRECT in the last 72 hours. Thyroid Function Tests: Recent Labs    11/01/17 2201  TSH 1.470   Anemia Panel: No results for input(s): VITAMINB12, FOLATE, FERRITIN, TIBC, IRON, RETICCTPCT in the last 72 hours. Urine analysis: No results found for: COLORURINE, APPEARANCEUR, LABSPEC, PHURINE, GLUCOSEU, HGBUR, BILIRUBINUR, KETONESUR, PROTEINUR, UROBILINOGEN, NITRITE, LEUKOCYTESUR Sepsis Labs: @LABRCNTIP (procalcitonin:4,lacticidven:4) )No results found for this or any previous visit (from the past 240 hour(s)).   Radiological Exams on Admission: Dg Chest 2 View  Result Date: 11/01/2017 CLINICAL DATA:  Shortness of breath EXAM: CHEST - 2 VIEW COMPARISON:  10/25/2017, 03/30/2017 FINDINGS: Right shoulder replacement. Cardiomegaly with vascular congestion. No pleural effusion. No focal consolidation. Aortic atherosclerosis. No pneumothorax. Surgical anchors in the left humeral head. IMPRESSION: Cardiomegaly with mild vascular congestion Electronically Signed   By: Donavan Foil M.D.   On: 11/01/2017 22:48   Ct Head Wo Contrast  Result Date: 11/01/2017 CLINICAL DATA:  Headache, hypertension EXAM: CT HEAD WITHOUT CONTRAST TECHNIQUE: Contiguous axial images were obtained from the base of the skull through the vertex without intravenous contrast. COMPARISON:  10/25/2017 FINDINGS: Brain: Retro cerebellar CSF accumulation again noted is stable since prior study. No hemorrhage, hydrocephalus or acute infarction. Vascular: No hyperdense vessel or unexpected calcification. Skull: No acute calvarial abnormality. Sinuses/Orbits: No acute finding Other: None IMPRESSION: No acute intracranial abnormality or change  since prior study. Electronically Signed   By: Rolm Baptise M.D.   On: 11/01/2017 22:09   Mr Brain Wo Contrast  Result Date: 11/01/2017 CLINICAL DATA:  Episodic headache and dizziness since yesterday. History of hypertension and CHF. EXAM: MRI HEAD WITHOUT CONTRAST TECHNIQUE: Multiplanar, multiecho pulse sequences of the brain and surrounding structures were obtained without intravenous contrast. COMPARISON:  CT HEAD November 01, 2017 and MRI head March 30, 2017 FINDINGS: Mild motion degraded examination. INTRACRANIAL CONTENTS: No reduced diffusion  to suggest acute ischemia. No susceptibility artifact to suggest hemorrhage. The ventricles and sulci are normal for patient's age. A few scattered subcentimeter supratentorial white matter FLAIR T2 hyperintensities compatible with mild chronic small vessel ischemic changes, less than expected for age. No suspicious parenchymal signal, masses, mass effect. No abnormal extra-axial fluid collections. Prominent posterior fossa extra-axial spaces. No extra-axial masses. VASCULAR: Normal major intracranial vascular flow voids present at skull base. SKULL AND UPPER CERVICAL SPINE: No abnormal sellar expansion. No suspicious calvarial bone marrow signal. Craniocervical junction maintained. SINUSES/ORBITS: Mild paranasal sinus mucosal thickening without air-fluid levels. Mastoid air cells are well aerated. The included ocular globes and orbital contents are non-suspicious. OTHER: Patient is edentulous. IMPRESSION: 1. Mild motion degraded examination.  No acute intracranial process. 2. 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 Electronically Signed   By: Elon Alas M.D.   On: 11/01/2017 23:07    EKG: Independently reviewed.  Sinus rhythm with a rate of 61.  Right bundle branch block.  Assessment/Plan Principal Problem:   TIA (transient ischemic attack) Active  Problems:   CHF (congestive heart failure) (HCC)   HTN (hypertension)   Hyperlipidemia     #1 TIA: Probably secondary to hypertensive urgency.  MRI showed no acute findings.  Patient will need carotid Dopplers and probably echocardiogram if not done last week.  With his diagnosis of CHF will need to find out if he had one at [place].  If not we will order a new one here.  Meanwhile observe patient overnight and monitor.  #2 newly diagnosed CHF: Patient has been on treatment for a week now.  We will need to know his baseline renal function as current renal function is at 2.5.  Avoid excessive diuresis.  #3 hypertension: At this point blood pressure is much better controlled.  Patient reported earlier significant increase in blood pressure.  Will monitor in the hospital.  #4 hyperlipidemia: Continue home regimen of statin.   DVT prophylaxis: Heparin Code Status: Full code Family Communication: Daughter and grandson in the room Disposition Plan: Home Consults called: None at this point Admission status: Observation  Severity of Illness: The appropriate patient status for this patient is OBSERVATION. Observation status is judged to be reasonable and necessary in order to provide the required intensity of service to ensure the patient's safety. The patient's presenting symptoms, physical exam findings, and initial radiographic and laboratory data in the context of their medical condition is felt to place them at decreased risk for further clinical deterioration. Furthermore, it is anticipated that the patient will be medically stable for discharge from the hospital within 2 midnights of admission. The following factors support the patient status of observation.   " The patient's presenting symptoms include dizziness headache or shortness of breath. " The physical exam findings include mild bilateral crackles. " The initial radiographic and laboratory data are worsening renal  function.     Barbette Merino MD [facility] Pager [phone]  If 7PM-7AM, please contact night-coverage [email or web address] Password [email or web address]  11/02/2017, 12:11 AM

## 2017-11-02 NOTE — Progress Notes (Signed)
Bilateral carotid duplex completed. 1% to 39% ICA stenosis. Vertebral artery flow is antegrade. [place] Kirt Chew,RVS 11/02/2017 2:05 PM

## 2017-11-03 ENCOUNTER — Inpatient Hospital Stay (HOSPITAL_COMMUNITY): Payer: PPO

## 2017-11-03 DIAGNOSIS — N179 Acute kidney failure, unspecified: Secondary | ICD-10-CM

## 2017-11-03 DIAGNOSIS — I10 Essential (primary) hypertension: Secondary | ICD-10-CM

## 2017-11-03 LAB — BASIC METABOLIC PANEL
ANION GAP: 8 (ref 5–15)
BUN: 41 mg/dL — ABNORMAL HIGH (ref 8–23)
CALCIUM: 9 mg/dL (ref 8.9–10.3)
CHLORIDE: 107 mmol/L (ref 98–111)
CO2: 23 mmol/L (ref 22–32)
Creatinine, Ser: 2.3 mg/dL — ABNORMAL HIGH (ref 0.61–1.24)
GFR calc non Af Amer: 25 mL/min — ABNORMAL LOW (ref >60)
GFR, EST AFRICAN AMERICAN: 29 mL/min — AB (ref >60)
Glucose, Bld: 93 mg/dL (ref 70–99)
Potassium: 4.2 mmol/L (ref 3.5–5.1)
Sodium: 138 mmol/L (ref 135–145)

## 2017-11-03 LAB — CBC
HCT: 40.4 % (ref 39.0–52.0)
Hemoglobin: 12.7 g/dL — ABNORMAL LOW (ref 13.0–17.0)
MCH: 26 pg (ref 26.0–34.0)
MCHC: 31.4 g/dL (ref 30.0–36.0)
MCV: 82.6 fL (ref 80.0–100.0)
NRBC: 0 % (ref 0.0–0.2)
Platelets: 313 10*3/uL (ref 150–400)
RBC: 4.89 MIL/uL (ref 4.22–5.81)
RDW: 14.8 % (ref 11.5–15.5)
WBC: 10.1 10*3/uL (ref 4.0–10.5)

## 2017-11-03 LAB — URINE CULTURE: Culture: NO GROWTH

## 2017-11-03 MED ORDER — HYDRALAZINE HCL 10 MG PO TABS: 10.0000 mg | ORAL_TABLET | Freq: Three times a day (TID) | ORAL | 0 refills | Status: DC

## 2017-11-03 MED ORDER — ASPIRIN 325 MG PO TABS
325.0000 mg | ORAL_TABLET | Freq: Every day | ORAL | Status: DC
  Administered 2017-11-03: 325 mg via ORAL
  Filled 2017-11-03: qty 1

## 2017-11-03 MED ORDER — ASPIRIN 325 MG PO TABS: 325.0000 mg | ORAL_TABLET | Freq: Every day | ORAL | 0 refills | Status: DC

## 2017-11-03 MED ORDER — HYDRALAZINE HCL 10 MG PO TABS
10.0000 mg | ORAL_TABLET | Freq: Three times a day (TID) | ORAL | Status: DC
  Administered 2017-11-03: 10 mg via ORAL
  Filled 2017-11-03: qty 1

## 2017-11-03 NOTE — Plan of Care (Signed)
Pt ready for discharge

## 2017-11-05 DIAGNOSIS — I5022 Chronic systolic (congestive) heart failure: Secondary | ICD-10-CM | POA: Diagnosis not present

## 2017-11-05 DIAGNOSIS — Z6828 Body mass index (BMI) 28.0-28.9, adult: Secondary | ICD-10-CM | POA: Diagnosis not present

## 2017-11-05 DIAGNOSIS — Z8673 Personal history of transient ischemic attack (TIA), and cerebral infarction without residual deficits: Secondary | ICD-10-CM | POA: Diagnosis not present

## 2017-11-05 DIAGNOSIS — N189 Chronic kidney disease, unspecified: Secondary | ICD-10-CM | POA: Diagnosis not present

## 2017-11-05 DIAGNOSIS — I10 Essential (primary) hypertension: Secondary | ICD-10-CM | POA: Diagnosis not present

## 2017-11-05 DIAGNOSIS — I259 Chronic ischemic heart disease, unspecified: Secondary | ICD-10-CM | POA: Diagnosis not present

## 2017-11-05 NOTE — Discharge Summary (Signed)
Physician Discharge Summary  Nicholas [name] YKD:[id number] DOB: Mar 06, 1933 DOA: 11/01/2017  PCP: Emmaline Kluver, MD  Admit date: 11/01/2017 Discharge date: 11/03/2017  Admitted From: Home.  Disposition:  HOme.   Recommendations for Outpatient Follow-up:  1. Follow up with PCP in 1-2 weeks 2. Please obtain BMP/CBC in one week 3. Please follow up with Neurology in 4 weeks.  4. Please follow up with cardiology in 2 weeks for new diastolic dysfunction one echo 5. We have stopped your lasix because of your kidney function, follow upw ith Dr Justin Mend in one week.   Home Health:yes   Discharge Condition: stable.  CODE STATUS:full code.  Diet recommendation: Heart Healthy   Brief/Interim Summary: Nicholas [name] a 84 y.o.malewith medical history significant ofnewly diagnosed congestive heart failure apparently at [facility] last week, hypertension, hyperlipidemia and chronic kidney disease stage III who apparently was placed on diuretics after new diagnosis of CHF only a week ago comes in for dizziness, nausea and weakness.   Discharge Diagnoses:  Principal Problem:   TIA (transient ischemic attack) Active Problems:   CHF (congestive heart failure) (HCC)   HTN (hypertension)   Hyperlipidemia  TIA:  Stroke ruled out with negative MRI brain.  Echocardiogram showed grade 1 diastolic dysfunction, no thrombus.  Carotid duplex does not show sig stenosis.  Therapy evals recommended home health PT and OT.  Discharged on aspirin 325 mg daily.    Acute on CKD stage 3.  probably worsened from lasix use.  Last creatinine was at 1.3.  Admitted with creatinine of 2.57, improved to 2.3 with hydration.  UA is negative for infection.  US RENAL does not show any hydronephrosis.  He does not appear fluid overload, hence holding losartan and lasix.  Recommend follow up with Dr Justin Mend in one week.    Hypertension: better controlled.  Resume bisoprolol, amlodipine and decrease the dose of  hydralazine.   Discharge Instructions  Discharge Instructions    Diet - low sodium heart healthy   Complete by:  As directed    Discharge instructions   Complete by:  As directed    Please follow up with PCP in one week.  Please check bmp to check renal parameters in one week.  We have stopped the losartan and lasix for your worsening kidney function and decreased the dose of hydralazine to 10 mg three times a day.  We have stopped the aspirin 81 mg daily and increased it to 325 mg daily.  Please follow up with Dr Justin Mend in 1 to 2 weeks.  Please follow up with cardiology for diastolic dysfunction evident on echocardiogram.  Please follow up with [facility] in 4 to 6 weeks.     Allergies as of 11/03/2017      Reactions   Penicillins Hives   Childhood allergy Has patient had a PCN reaction causing immediate rash, facial/tongue/throat swelling, SOB or lightheadedness with hypotension: Yes Has patient had a PCN reaction causing severe rash involving mucus membranes or skin necrosis: Yes Has patient had a PCN reaction that required hospitalization: Yes Has patient had a PCN reaction occurring within the last 10 years: No If all of the above answers are "NO", then may proceed with Cephalosporin use.      Medication List    STOP taking these medications   aspirin EC 81 MG tablet Replaced by:  aspirin 325 MG tablet   furosemide 20 MG tablet Commonly known as:  LASIX   losartan 100 MG tablet Commonly  known as:  COZAAR     TAKE these medications   acetaminophen 500 MG tablet Commonly known as:  TYLENOL Take 500-1,000 mg by mouth every 8 (eight) hours as needed (for pain or headaches).   albuterol 108 (90 Base) MCG/ACT inhaler Commonly known as:  PROVENTIL HFA;VENTOLIN HFA Inhale 2 puffs into the lungs See admin instructions. Inhale 2 puffs into the lungs in the morning and 2 puffs at bedtime and may be used every 4 hours as needed for shortness of breath or  wheezing   amLODipine 10 MG tablet Commonly known as:  NORVASC Take 5 mg by mouth daily.   aspirin 325 MG tablet Take 1 tablet (325 mg total) by mouth daily. Replaces:  aspirin EC 81 MG tablet   bisoprolol 5 MG tablet Commonly known as:  ZEBETA Take 2.5 mg by mouth every morning.   CALCIUM 600+D3 PO Take 2 tablets by mouth daily.   febuxostat 40 MG tablet Commonly known as:  ULORIC Take 40 mg by mouth every morning.   Fish Oil 1000 MG Caps Take 1,000 mg by mouth daily.   Fluticasone-Salmeterol 250-50 MCG/DOSE Aepb Commonly known as:  ADVAIR Inhale 1 puff into the lungs 2 (two) times daily.   hydrALAZINE 10 MG tablet Commonly known as:  APRESOLINE Take 1 tablet (10 mg total) by mouth 3 (three) times daily. What changed:    medication strength  how much to take   latanoprost 0.005 % ophthalmic solution Commonly known as:  XALATAN Place 1 drop into both eyes at bedtime.   multivitamin with minerals Tabs tablet Take 1 tablet by mouth every morning.   omeprazole 20 MG capsule Commonly known as:  PRILOSEC Take 20 mg by mouth every morning.      Follow-up Information    Street, [name], MD. Schedule an appointment as soon as possible for a visit in 1 week(s).   Specialty:  Family Medicine Contact information: [place] [address] [phone]        [place]. Schedule an appointment as soon as possible for a visit in 4 week(s).   Contact information: [place] [phone] [place] [facility] Follow up in 2 week(s).   Specialty:  Cardiology Why:  for new onset diastolic dysfunction on echocardiogram.  Contact information: [address] [place] [place]       Edrick Oh, MD. Schedule an appointment as soon as possible for a visit in 1 week(s).   Specialty:  Nephrology Why:  for  stage 4 CKD.  Contact information: [place] [address] [phone]          Allergies  Allergen Reactions  . Penicillins Hives    Childhood allergy Has patient had a PCN reaction causing immediate rash, facial/tongue/throat swelling, SOB or lightheadedness with hypotension: Yes Has patient had a PCN reaction causing severe rash involving mucus membranes or skin necrosis: Yes Has patient had a PCN reaction that required hospitalization: Yes Has patient had a PCN reaction occurring within the last 10 years: No If all of the above answers are "NO", then may proceed with Cephalosporin use.     Consultations:  None.    Procedures/Studies: Dg Chest 2 View  Result Date: 11/01/2017 CLINICAL DATA:  Shortness of breath EXAM: CHEST - 2 VIEW COMPARISON:  10/25/2017, 03/30/2017 FINDINGS: Right shoulder replacement.  Cardiomegaly with vascular congestion. No pleural effusion. No focal consolidation. Aortic atherosclerosis. No pneumothorax. Surgical anchors in the left humeral head. IMPRESSION: Cardiomegaly with mild vascular congestion Electronically Signed   By: Donavan Foil M.D.   On: 11/01/2017 22:48   Ct Head Wo Contrast  Result Date: 11/01/2017 CLINICAL DATA:  Headache, hypertension EXAM: CT HEAD WITHOUT CONTRAST TECHNIQUE: Contiguous axial images were obtained from the base of the skull through the vertex without intravenous contrast. COMPARISON:  10/25/2017 FINDINGS: Brain: Retro cerebellar CSF accumulation again noted is stable since prior study. No hemorrhage, hydrocephalus or acute infarction. Vascular: No hyperdense vessel or unexpected calcification. Skull: No acute calvarial abnormality. Sinuses/Orbits: No acute finding Other: None IMPRESSION: No acute intracranial abnormality or change since prior study. Electronically Signed   By: Rolm Baptise M.D.   On: 11/01/2017 22:09   Mr Brain Wo Contrast  Result Date: 11/01/2017 CLINICAL DATA:  Episodic headache and  dizziness since yesterday. History of hypertension and CHF. EXAM: MRI HEAD WITHOUT CONTRAST TECHNIQUE: Multiplanar, multiecho pulse sequences of the brain and surrounding structures were obtained without intravenous contrast. COMPARISON:  CT HEAD November 01, 2017 and MRI head March 30, 2017 FINDINGS: Mild motion degraded examination. INTRACRANIAL CONTENTS: No reduced diffusion to suggest acute ischemia. No susceptibility artifact to suggest hemorrhage. The ventricles and sulci are normal for patient's age. A few scattered subcentimeter supratentorial white matter FLAIR T2 hyperintensities compatible with mild chronic small vessel ischemic changes, less than expected for age. No suspicious parenchymal signal, masses, mass effect. No abnormal extra-axial fluid collections. Prominent posterior fossa extra-axial spaces. No extra-axial masses. VASCULAR: Normal major intracranial vascular flow voids present at skull base. SKULL AND UPPER CERVICAL SPINE: No abnormal sellar expansion. No suspicious calvarial bone marrow signal. Craniocervical junction maintained. SINUSES/ORBITS: Mild paranasal sinus mucosal thickening without air-fluid levels. Mastoid air cells are well aerated. The included ocular globes and orbital contents are non-suspicious. OTHER: Patient is edentulous. IMPRESSION: 1. Mild motion degraded examination.  No acute intracranial process. 2. 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 Electronically Signed   By: Elon Alas M.D.   On: 11/01/2017 23:07   US Renal  Result Date: 11/03/2017 CLINICAL DATA:  Acute kidney injury. EXAM: RENAL / URINARY TRACT ULTRASOUND COMPLETE COMPARISON:  Limited right upper quadrant abdomen ultrasound dated 10/26/2008. FINDINGS: Right Kidney: Length: 9.5 cm. Diffuse parenchymal thinning and prominent renal sinus fat. Mildly echogenic cortex. 2.5 x 2.3 x 2.1 cm partially  exophytic, oval, hypoechoic mass arising posterior aspect of the upper pole no internal blood flow seen with color Doppler. This is not seen on the previous images. Those images were not obtained for evaluation of the kidney. No hydronephrosis. Left Kidney: Length: 11.2 cm. Diffuse parenchymal thinning and prominent renal sinus fat. Mildly increased echogenicity. No hydronephrosis. Bladder: Prominent prostate gland protruding into the base of the urinary bladder. Otherwise, the bladder has a normal appearance. The prostate measures 4.2 x 3.6 x 3.0 cm. IMPRESSION: 1. Diffuse bilateral renal parenchymal atrophy. 2. Mildly echogenic kidneys, compatible with mild changes of medical renal disease. 3. 2.5 cm probable mildly complicated cyst arising from the lower pole of the right kidney. A solid mass is less likely but not excluded. 4. No hydronephrosis. 5. Mild to moderate prostatic hypertrophy. Electronically Signed   By: Claudie Revering M.D.   On: 11/03/2017 13:35       Subjective: Pt denies any chest pain or sob.  Discharge Exam: Vitals:   11/03/17 0736 11/03/17 0751  BP:  126/65  Pulse:  73  Resp:  16  Temp:  97.6 F (36.4 C)  SpO2: 98% 96%   Vitals:   11/02/17 2349 11/03/17 0300 11/03/17 0736 11/03/17 0751  BP: 116/61 110/65  126/65  Pulse: 78 68  73  Resp: 18 18  16   Temp: 98.3 F (36.8 C) 98.1 F (36.7 C)  97.6 F (36.4 C)  TempSrc: Oral Oral  Oral  SpO2: 95% 98% 98% 96%    General: Pt is alert, awake, not in acute distress Cardiovascular: RRR, S1/S2 +, no rubs, no gallops Respiratory: CTA bilaterally, no wheezing, no rhonchi Abdominal: Soft, NT, ND, bowel sounds + Extremities: no edema, no cyanosis    The results of significant diagnostics from this hospitalization (including imaging, microbiology, ancillary and laboratory) are listed below for reference.     Microbiology: Recent Results (from the past 240 hour(s))  Urine culture     Status: None   Collection Time:  11/01/17 11:22 PM  Result Value Ref Range Status   Specimen Description URINE, CLEAN CATCH  Final   Special Requests NONE  Final   Culture   Final    NO GROWTH Performed at [facility], [address], [place], [place] [address]    Report Status 11/03/2017 FINAL  Final     Labs: BNP (last 3 results) Recent Labs    11/01/17 2104  BNP 852.7*   Basic Metabolic Panel: Recent Labs  Lab 11/01/17 1929 11/01/17 2105 11/02/17 0425 11/03/17 0534  NA 134*  --  136 138  K 4.6  --  4.3 4.2  CL 103  --  103 107  CO2 21*  --  21* 23  GLUCOSE 128*  --  112* 93  BUN 45*  --  42* 41*  CREATININE 2.57*  --  2.33* 2.30*  CALCIUM 8.9  --  9.0 9.0  MG  --  2.1  --   --    Liver Function Tests: Recent Labs  Lab 11/01/17 1929 11/02/17 0425  AST 23 18  ALT 21 19  ALKPHOS 99 90  BILITOT 0.8 0.7  PROT 6.3* 5.8*  ALBUMIN 3.5 3.2*   Recent Labs  Lab 11/01/17 2105  LIPASE 62*   No results for input(s): AMMONIA in the last 168 hours. CBC: Recent Labs  Lab 11/01/17 1929 11/02/17 0425 11/03/17 0534  WBC 14.7* 12.4* 10.1  HGB 13.0 12.4* 12.7*  HCT 41.2 39.4 40.4  MCV 84.1 82.8 82.6  PLT 379 334 313   Cardiac Enzymes: No results for input(s): CKTOTAL, CKMB, CKMBINDEX, TROPONINI in the last 168 hours. BNP: Invalid input(s): POCBNP CBG: Recent Labs  Lab 11/01/17 2014  GLUCAP 128*   D-Dimer No results for input(s): DDIMER in the last 72 hours. Hgb A1c No results for input(s): HGBA1C in the last 72 hours. Lipid Profile No results for input(s): CHOL, HDL, LDLCALC, TRIG, CHOLHDL, LDLDIRECT in the last 72 hours. Thyroid function studies No results for input(s): TSH, T4TOTAL, T3FREE, THYROIDAB in the last 72 hours.  Invalid input(s): FREET3 Anemia work up No results for input(s): VITAMINB12, FOLATE, FERRITIN, TIBC, IRON, RETICCTPCT in the last 72 hours. Urinalysis    Component Value Date/Time   COLORURINE YELLOW 11/01/2017 2324   APPEARANCEUR CLEAR 11/01/2017  2324   LABSPEC 1.014 11/01/2017 2324   PHURINE 5.0 11/01/2017 2324   GLUCOSEU NEGATIVE 11/01/2017 2324   HGBUR NEGATIVE 11/01/2017 2324  BILIRUBINUR NEGATIVE 11/01/2017 2324   KETONESUR NEGATIVE 11/01/2017 2324   PROTEINUR NEGATIVE 11/01/2017 2324   NITRITE NEGATIVE 11/01/2017 2324   LEUKOCYTESUR NEGATIVE 11/01/2017 2324   Sepsis Labs Invalid input(s): PROCALCITONIN,  WBC,  LACTICIDVEN Microbiology Recent Results (from the past 240 hour(s))  Urine culture     Status: None   Collection Time: 11/01/17 11:22 PM  Result Value Ref Range Status   Specimen Description URINE, CLEAN CATCH  Final   Special Requests NONE  Final   Culture   Final    NO GROWTH Performed at [facility], [place] [address] [place], [place] [address]    Report Status 11/03/2017 FINAL  Final     Time coordinating discharge: 72minutes  SIGNED:   Hosie Poisson, MD  [facility] 11/05/2017, 8:24 AM Pager   If 7PM-7AM, please contact night-coverage [email or web address] Password [email or web address]

## 2017-11-16 DIAGNOSIS — N281 Cyst of kidney, acquired: Secondary | ICD-10-CM | POA: Diagnosis not present

## 2017-11-16 DIAGNOSIS — N401 Enlarged prostate with lower urinary tract symptoms: Secondary | ICD-10-CM | POA: Diagnosis not present

## 2017-11-23 DIAGNOSIS — E785 Hyperlipidemia, unspecified: Secondary | ICD-10-CM | POA: Diagnosis not present

## 2017-11-23 DIAGNOSIS — N179 Acute kidney failure, unspecified: Secondary | ICD-10-CM | POA: Diagnosis not present

## 2017-11-23 DIAGNOSIS — D631 Anemia in chronic kidney disease: Secondary | ICD-10-CM | POA: Diagnosis not present

## 2017-11-23 DIAGNOSIS — G459 Transient cerebral ischemic attack, unspecified: Secondary | ICD-10-CM | POA: Diagnosis not present

## 2017-11-23 DIAGNOSIS — N2581 Secondary hyperparathyroidism of renal origin: Secondary | ICD-10-CM | POA: Diagnosis not present

## 2017-11-23 DIAGNOSIS — N183 Chronic kidney disease, stage 3 (moderate): Secondary | ICD-10-CM | POA: Diagnosis not present

## 2017-11-23 DIAGNOSIS — Z8709 Personal history of other diseases of the respiratory system: Secondary | ICD-10-CM | POA: Diagnosis not present

## 2017-11-23 DIAGNOSIS — I5030 Unspecified diastolic (congestive) heart failure: Secondary | ICD-10-CM | POA: Diagnosis not present

## 2017-11-23 DIAGNOSIS — N189 Chronic kidney disease, unspecified: Secondary | ICD-10-CM | POA: Diagnosis not present

## 2017-11-27 ENCOUNTER — Ambulatory Visit: Payer: PPO | Admitting: Cardiology

## 2017-11-27 ENCOUNTER — Encounter: Payer: Self-pay | Admitting: Cardiology

## 2017-11-27 VITALS — BP 136/60 | HR 61 | Ht 69.0 in | Wt 201.2 lb

## 2017-11-27 DIAGNOSIS — I10 Essential (primary) hypertension: Secondary | ICD-10-CM | POA: Diagnosis not present

## 2017-11-27 DIAGNOSIS — N1832 Chronic kidney disease, stage 3b: Secondary | ICD-10-CM | POA: Insufficient documentation

## 2017-11-27 DIAGNOSIS — N184 Chronic kidney disease, stage 4 (severe): Secondary | ICD-10-CM | POA: Insufficient documentation

## 2017-11-27 DIAGNOSIS — E782 Mixed hyperlipidemia: Secondary | ICD-10-CM

## 2017-11-27 DIAGNOSIS — N289 Disorder of kidney and ureter, unspecified: Secondary | ICD-10-CM | POA: Diagnosis not present

## 2017-11-27 DIAGNOSIS — G459 Transient cerebral ischemic attack, unspecified: Secondary | ICD-10-CM | POA: Diagnosis not present

## 2017-11-27 DIAGNOSIS — I5030 Unspecified diastolic (congestive) heart failure: Secondary | ICD-10-CM

## 2017-11-27 NOTE — Progress Notes (Signed)
Cardiology Office Note:    Date:  11/27/2017   ID:  Nicholas [name], DOB 07/19/1933, MRN [id number]  PCP:  Street, [name], MD  Cardiologist:  Jenean Lindau, MD   Referring MD: Street, [name], *    ASSESSMENT:    1. Diastolic congestive heart failure, unspecified HF chronicity ([place])   2. TIA (transient ischemic attack)   3. Mixed hyperlipidemia   4. Essential hypertension   5. Renal insufficiency    PLAN:    In order of problems listed above:  1. Primary prevention stressed with the patient.  Importance of compliance with diet and medication stressed and he vocalized understanding.  His blood pressure is stable.  Diet was discussed for congestive heart failure at extensive length and questions were answered to his satisfaction.  He is checking his weight on a regular basis.  He recently saw his primary care doctor after hospital discharge and blood work was done and this is followed by his primary doctor.  He also has an appointment with the nephrologist soon coming up. 2. Patient will be seen in follow-up appointment in 9 months or earlier if the patient has any concerns    Medication Adjustments/Labs and Tests Ordered: Current medicines are reviewed at length with the patient today.  Concerns regarding medicines are outlined above.  No orders of the defined types were placed in this encounter.  No orders of the defined types were placed in this encounter.    History of Present Illness:    Nicholas [name] is a 84 y.o. male who is being seen today for the evaluation of congestive heart failure at the request of Street, [name], *.  Patient is a pleasant 84 year old male.  He is previously unknown to me.  He recently was admitted to [facility] and transferred to [facility].  Apparently had high blood pressure and was treated aggressively.  Family mentions to me that his blood pressure bottomed out and he was transferred to [facility].   Subsequently he had issues with kidney failure and such problems.  He was treated and released and subsequently has been doing well.  His systolic function was fine by echocardiogram "I reviewed that was done during the hospital stay.  At the time of my evaluation, the patient is alert awake oriented and in no distress.  His daughter accompanies for this visit and was very supportive.  Past Medical History:  Diagnosis Date  . Anemia    none recent  . Arthritis   . Asthma   . Chronic kidney disease    ckd stage 3  . Family history of adverse reaction to anesthesia    daughter has ponv   . Hypertension     Past Surgical History:  Procedure Laterality Date  . COLONOSCOPY WITH PROPOFOL N/A 02/03/2015   Procedure: COLONOSCOPY WITH PROPOFOL;  Surgeon: Arta Silence, MD;  Location: [facility]COPY;  Service: Endoscopy;  Laterality: N/A;  . KNEE ARTHROSCOPY Left   . ROTATOR CUFF REPAIR Right   . SHOULDER ARTHROSCOPY Left     Current Medications: Current Meds  Medication Sig  . acetaminophen (TYLENOL) 500 MG tablet Take 500-1,000 mg by mouth every 8 (eight) hours as needed (for pain or headaches).   Marland Kitchen albuterol (PROVENTIL HFA;VENTOLIN HFA) 108 (90 Base) MCG/ACT inhaler Inhale 2 puffs into the lungs See admin instructions. Inhale 2 puffs into the lungs in the morning and 2 puffs at bedtime and may be used every 4  hours as needed for shortness of breath or wheezing  . amLODipine (NORVASC) 10 MG tablet Take 5 mg by mouth daily.  Marland Kitchen aspirin 325 MG tablet Take 1 tablet (325 mg total) by mouth daily.  . bisoprolol (ZEBETA) 5 MG tablet Take 2.5 mg by mouth every morning.   . Calcium Carb-Cholecalciferol (CALCIUM 600+D3 PO) Take 2 tablets by mouth daily.  . febuxostat (ULORIC) 40 MG tablet Take 40 mg by mouth every morning.  . Fluticasone-Salmeterol (ADVAIR) 250-50 MCG/DOSE AEPB Inhale 1 puff into the lungs 2 (two) times daily.  . hydrALAZINE (APRESOLINE) 10 MG tablet Take 1 tablet (10 mg total) by  mouth 3 (three) times daily.  Marland Kitchen latanoprost (XALATAN) 0.005 % ophthalmic solution Place 1 drop into both eyes at bedtime.  . Multiple Vitamin (MULTIVITAMIN WITH MINERALS) TABS tablet Take 1 tablet by mouth every morning.  . Omega-3 Fatty Acids (FISH OIL) 1000 MG CAPS Take 1,000 mg by mouth daily.  Marland Kitchen omeprazole (PRILOSEC) 20 MG capsule Take 20 mg by mouth every morning.     Allergies:   Penicillins   Social History   Socioeconomic History  . Marital status: Married    Spouse name: Not on file  . Number of children: Not on file  . Years of education: Not on file  . Highest education level: Not on file  Occupational History  . Not on file  Social Needs  . Financial resource strain: Not on file  . Food insecurity:    Worry: Not on file    Inability: Not on file  . Transportation needs:    Medical: Not on file    Non-medical: Not on file  Tobacco Use  . Smoking status: Former Research scientist (life sciences)  . Smokeless tobacco: Never Used  . Tobacco comment: quit 35 yrs ago  Substance and Sexual Activity  . Alcohol use: No  . Drug use: No  . Sexual activity: Not on file  Lifestyle  . Physical activity:    Days per week: Not on file    Minutes per session: Not on file  . Stress: Not on file  Relationships  . Social connections:    Talks on phone: Not on file    Gets together: Not on file    Attends religious service: Not on file    Active member of club or organization: Not on file    Attends meetings of clubs or organizations: Not on file    Relationship status: Not on file  Other Topics Concern  . Not on file  Social History Narrative  . Not on file     Family History: The patient's family history is not on file.  ROS:   Please see the history of present illness.    All other systems reviewed and are negative.  EKGs/Labs/Other Studies Reviewed:    The following studies were reviewed: I discussed my findings and hospital records evaluation with the patient at length and questions  were answered to his satisfaction.   Recent Labs: 11/01/2017: B Natriuretic Peptide 123.5; Magnesium 2.1 11/02/2017: ALT 19; TSH 1.149 11/03/2017: BUN 41; Creatinine, Ser 2.30; Hemoglobin 12.7; Platelets 313; Potassium 4.2; Sodium 138  Recent Lipid Panel No results found for: CHOL, TRIG, HDL, CHOLHDL, VLDL, LDLCALC, LDLDIRECT  Physical Exam:    VS:  BP 136/60 (BP Location: Right Arm, Patient Position: Sitting, Cuff Size: Normal)   Pulse 61   Ht 5\' 9"  (1.753 m)   Wt 201 lb 3.2 oz (91.3 kg)   SpO2 97%  BMI 29.71 kg/m     Wt Readings from Last 3 Encounters:  11/27/17 201 lb 3.2 oz (91.3 kg)  02/03/15 215 lb (97.5 kg)     GEN: Patient is in no acute distress HEENT: Normal NECK: No JVD; No carotid bruits LYMPHATICS: No lymphadenopathy CARDIAC: S1 S2 regular, 2/6 systolic murmur at the apex. RESPIRATORY:  Clear to auscultation without rales, wheezing or rhonchi  ABDOMEN: Soft, non-tender, non-distended MUSCULOSKELETAL:  No edema; No deformity  SKIN: Warm and dry NEUROLOGIC:  Alert and oriented x 3 PSYCHIATRIC:  Normal affect    Signed, Jenean Lindau, MD  11/27/2017 9:38 AM    [facility]

## 2017-11-27 NOTE — Patient Instructions (Signed)
Medication Instructions:  Your physician recommends that you continue on your current medications as directed. Please refer to the Current Medication list given to you today.  If you need a refill on your cardiac medications before your next appointment, please call your pharmacy.   Lab work: None  If you have labs (blood work) drawn today and your tests are completely normal, you will receive your results only by: . MyChart Message (if you have MyChart) OR . A paper copy in the mail If you have any lab test that is abnormal or we need to change your treatment, we will call you to review the results.  Testing/Procedures: None  Follow-Up: At [facility], you and your health needs are our priority.  As part of our continuing mission to provide you with exceptional heart care, we have created designated Provider Care Teams.  These Care Teams include your primary Cardiologist (physician) and Advanced Practice Providers (APPs -  Physician Assistants and Nurse Practitioners) who all work together to provide you with the care you need, when you need it.  You will need a follow up appointment in 6 months.  Please call our office 2 months in advance to schedule this appointment.  You may see another member of our [facility] Provider Team in : Luiscarlos Krasowski, MD . Brian Munley, MD  Any Other Special Instructions Will Be Listed Below (If Applicable).    

## 2017-12-10 ENCOUNTER — Encounter: Payer: Self-pay | Admitting: Neurology

## 2017-12-10 ENCOUNTER — Other Ambulatory Visit: Payer: Self-pay

## 2017-12-10 ENCOUNTER — Telehealth: Payer: Self-pay | Admitting: Neurology

## 2017-12-10 ENCOUNTER — Ambulatory Visit (INDEPENDENT_AMBULATORY_CARE_PROVIDER_SITE_OTHER): Payer: PPO | Admitting: Neurology

## 2017-12-10 VITALS — BP 170/81 | HR 83 | Ht 69.0 in | Wt 198.2 lb

## 2017-12-10 DIAGNOSIS — G459 Transient cerebral ischemic attack, unspecified: Secondary | ICD-10-CM | POA: Diagnosis not present

## 2017-12-10 DIAGNOSIS — R42 Dizziness and giddiness: Secondary | ICD-10-CM | POA: Diagnosis not present

## 2017-12-10 MED ORDER — PANTOPRAZOLE SODIUM 20 MG PO TBEC: 20.0000 mg | DELAYED_RELEASE_TABLET | Freq: Every day | ORAL | 3 refills | Status: DC

## 2017-12-10 MED ORDER — CLOPIDOGREL BISULFATE 75 MG PO TABS: 75.0000 mg | ORAL_TABLET | Freq: Every day | ORAL | 11 refills | Status: DC

## 2017-12-10 NOTE — Progress Notes (Signed)
[facility] [address] [place]. [place] [address] [phone]       OFFICE CONSULT NOTE  Nicholas. Nicholas [name] Date of Birth:  1933-02-19 Medical Record Number:  [id number]   Referring MD:   Christa See  Reason for Referral:  dizziness HPI: Nicholas [name] is a pleasant 84 year old Caucasian male seen today for initial office consultation visit for dizziness and TIA.  He is accompanied by his grandson [name] and history is obtained from them and review of electronic medical records.  I personally reviewed imaging films.  He is 84 year old male with past medical history for hypertension, asthma, chronic kidney disease and recent diagnosis of congestive heart failure who presented on 11/01/2017 to [facility] emergency room with complaints of severe headache, dizziness, transient right hand weakness with accompanying multifocal symptoms of worsening fatigue, shortness of breath, increased pedal edema and worsening kidney function.  He has been taking diuretics and blood pressure medications for elevated blood pressure.  He had previously been seen at urgent care in [place] a week ago with somewhat similar but less prominent symptoms.  He was diagnosed as congestive heart failure at that time and started on diuretic and blood pressure medication.  On exam he was found to have congestive heart failure but he did undergo an MRI scan of the brain which I personally reviewed on 11/01/2017 which was unremarkable.  CT scan of the head was also unremarkable.  Carotid ultrasound showed no significant extracranial stenosis.  Transthoracic echo showed normal ejection fraction but abnormal left ventricular relaxation consistent with grade 1 diastolic dysfunction.  The patient states that for several hours that they was off balance dizzy walking like a drunk and he had transient right hand weakness for little while.  He denies any other history of stroke or TIAs in the past.  He denies any  accompanying diplopia or loss of vision but did complain of mild nausea and vomiting.  There is no history of migraine headache seizures or loss of consciousness.  Since discharge from the ER patient has not had any further episodes of dizziness right hand weakness.  He was previously on aspirin 81 mg the dose of which was increased to 325 mg.  He states his blood pressure has been doing better.  Patient is living with his grandson.  He is independent in activities of daily living.  Is significantly hard of hearing.  He states that he had a somewhat similar episode in March 2019 when he developed episode of dizziness with cold clammy skin and sweating.  He was seen at [facility] and evaluated by tele-stroke neurologist.  MRI scan of the brain at that time was also negative.  Patient has not had CT angiogram or Nicholas angiograms done during any of his these 3 ER visits.  ROS:   14 system review of systems is positive for joint pain, hearing loss, dizziness, headache only and all other systems negative  PMH:  Past Medical History:  Diagnosis Date  . Anemia    none recent  . Arthritis   . Asthma   . Chronic kidney disease    ckd stage 3  . Family history of adverse reaction to anesthesia    daughter has ponv   . Hypertension Hyperlipidemia Congestive heart failure     Social History:  Social History   Socioeconomic History  . Marital status: Married    Spouse name: Not on file  . Number of children: 5  . Years of education:  8  . Highest education level: Not on file  Occupational History  . Occupation: Retired   Scientific laboratory technician  . Financial resource strain: Not on file  . Food insecurity:    Worry: Not on file    Inability: Not on file  . Transportation needs:    Medical: Not on file    Non-medical: Not on file  Tobacco Use  . Smoking status: Former Research scientist (life sciences)  . Smokeless tobacco: Never Used  . Tobacco comment: quit 35 yrs ago  Substance and Sexual Activity  . Alcohol use: No    . Drug use: No  . Sexual activity: Not on file  Lifestyle  . Physical activity:    Days per week: Not on file    Minutes per session: Not on file  . Stress: Not on file  Relationships  . Social connections:    Talks on phone: Not on file    Gets together: Not on file    Attends religious service: Not on file    Active member of club or organization: Not on file    Attends meetings of clubs or organizations: Not on file    Relationship status: Not on file  . Intimate partner violence:    Fear of current or ex partner: Not on file    Emotionally abused: Not on file    Physically abused: Not on file    Forced sexual activity: Not on file  Other Topics Concern  . Not on file  Social History Narrative   Lives with grandson, [name].    Caffeine use: daily   Right handed     Medications:   Current Outpatient Medications on File Prior to Visit  Medication Sig Dispense Refill  . acetaminophen (TYLENOL) 500 MG tablet Take 500-1,000 mg by mouth every 8 (eight) hours as needed (for pain or headaches).     Marland Kitchen albuterol (PROVENTIL HFA;VENTOLIN HFA) 108 (90 Base) MCG/ACT inhaler Inhale 2 puffs into the lungs See admin instructions. Inhale 2 puffs into the lungs in the morning and 2 puffs at bedtime and may be used every 4 hours as needed for shortness of breath or wheezing    . amLODipine (NORVASC) 10 MG tablet Take 5 mg by mouth daily.    . bisoprolol (ZEBETA) 5 MG tablet Take 2.5 mg by mouth every morning.     . Calcium Carb-Cholecalciferol (CALCIUM 600+D3 PO) Take 2 tablets by mouth daily.    . febuxostat (ULORIC) 40 MG tablet Take 40 mg by mouth every morning.    . Fluticasone-Salmeterol (ADVAIR) 250-50 MCG/DOSE AEPB Inhale 1 puff into the lungs 2 (two) times daily.    . hydrALAZINE (APRESOLINE) 10 MG tablet Take 1 tablet (10 mg total) by mouth 3 (three) times daily. 90 tablet 0  . latanoprost (XALATAN) 0.005 % ophthalmic solution Place 1 drop into both eyes at bedtime.    . Multiple  Vitamin (MULTIVITAMIN WITH MINERALS) TABS tablet Take 1 tablet by mouth every morning.    . Omega-3 Fatty Acids (FISH OIL) 1000 MG CAPS Take 1,000 mg by mouth daily.     No current facility-administered medications on file prior to visit.     Allergies:   Allergies  Allergen Reactions  . Penicillins Hives    Childhood allergy Has patient had a PCN reaction causing immediate rash, facial/tongue/throat swelling, SOB or lightheadedness with hypotension: Yes Has patient had a PCN reaction causing severe rash involving mucus membranes or skin necrosis: Yes Has patient had a PCN  reaction that required hospitalization: Yes Has patient had a PCN reaction occurring within the last 10 years: No If all of the above answers are "NO", then may proceed with Cephalosporin use.     Physical Exam General: frail elderly caucasian male, seated, in no evident distress Head: head normocephalic and atraumatic.   Neck: supple with no carotid or supraclavicular bruits Cardiovascular: regular rate and rhythm, no murmurs Musculoskeletal: no deformity Skin:  no rash/petichiae Vascular:  Normal pulses all extremities  Neurologic Exam Mental Status: Awake and fully alert. Oriented to place and time. Recent and remote memory intact. Attention span, concentration and fund of knowledge appropriate. Mood and affect appropriate.  Cranial Nerves: Fundoscopic exam reveals sharp disc margins. Pupils equal, briskly reactive to light. Extraocular movements full without nystagmus. Visual fields full to confrontation. Hearing significantly diminished bilaterallyt. Facial sensation intact. Face, tongue, palate moves normally and symmetrically.  Motor: Normal bulk and tone. Normal strength in all tested extremity muscles. Sensory.: intact to touch , pinprick , position and vibratory sensation.  Coordination: Rapid alternating movements normal in all extremities. Finger-to-nose and heel-to-shin performed accurately  bilaterally. Gait and Station: Arises from chair without difficulty. Stance is normal. Gait demonstrates normal stride length and balance . Able to heel, toe and tandem walk with great difficulty.  Reflexes: 1+ and symmetric. Toes downgoing.   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  Vascular risk factors of hypertension, hyperlipidemia, CHF and age     PLAN: I had a long d/w patient and his grandson about his recent episodes of dizziness and transient headache likely representing vertebrobasilar TIAs, risk for recurrent stroke/TIAs, personally independently reviewed imaging studies and stroke evaluation results and answered questions.  Discontinue aspirin and change to Plavix 75 mg daily instead for secondary stroke prevention and maintain strict control of hypertension with blood pressure goal below 130/90, diabetes with hemoglobin A1c goal below 6.5% and lipids with LDL cholesterol goal below 70 mg/dL. I also advised the patient to eat a healthy diet with plenty of whole grains, cereals, fruits and vegetables, exercise regularly and maintain ideal body weight.  He was advised to maintain adequate hydration. Change omeprazole to protonix due to interaction with plavix Check lipid profile, hemoglobin A1c, MRA of the brain and neck.  Greater than 50% time during this 45-minute consultation visit was spent on counseling and coordination of care about his episode of TIA and dizziness and answering questions.  Followup in the future with my nurse practitioner Janett Billow in 3 months or call earlier if necessary Antony Contras, MD Medical Director [facility] Pager: [phone] 12/10/2017 12:50 PM Note: This document was prepared with digital dictation and possible smart phrase technology. Any transcriptional errors that result from this process are  unintentional.

## 2017-12-10 NOTE — Patient Instructions (Addendum)
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  Discontinue aspirin and change to Plavix 75 mg daily instead for secondary stroke prevention and maintain strict control of hypertension with blood pressure goal below 130/90, diabetes with hemoglobin A1c goal below 6.5% and lipids with LDL cholesterol goal below 70 mg/dL. I also advised the patient to eat a healthy diet with plenty of whole grains, cereals, fruits and vegetables, exercise regularly and maintain ideal body weight.  He was advised to maintain adequate hydration. Change omeprazole to protonix due to interaction with plavix Check lipid profile, hemoglobin A1c, MRA of the brain and neck.  Followup in the future with my nurse practitioner Janett Billow in 3 months or call earlier if necessary  Stroke Prevention Some medical conditions and behaviors are associated with a higher chance of having a stroke. You can help prevent a stroke by making nutrition, lifestyle, and other changes, including managing any medical conditions you may have. What nutrition changes can be made?  Eat healthy foods. You can do this by: ? Choosing foods high in fiber, such as fresh fruits and vegetables and whole grains. ? Eating at least 5 or more servings of fruits and vegetables a day. Try to fill half of your plate at each meal with fruits and vegetables. ? Choosing lean protein foods, such as lean cuts of meat, poultry without skin, fish, tofu, beans, and nuts. ? Eating low-fat dairy products. ? Avoiding foods that are high in salt (sodium). This can help lower blood pressure. ? Avoiding foods that have saturated fat, trans fat, and cholesterol. This can help prevent high cholesterol. ? Avoiding processed and premade foods.  Follow your  health care provider's specific guidelines for losing weight, controlling high blood pressure (hypertension), lowering high cholesterol, and managing diabetes. These may include: ? Reducing your daily calorie intake. ? Limiting your daily sodium intake to 1,500 milligrams (mg). ? Using only healthy fats for cooking, such as olive oil, canola oil, or sunflower oil. ? Counting your daily carbohydrate intake. What lifestyle changes can be made?  Maintain a healthy weight. Talk to your health care provider about your ideal weight.  Get at least 30 minutes of moderate physical activity at least 5 days a week. Moderate activity includes brisk walking, biking, and swimming.  Do not use any products that contain nicotine or tobacco, such as cigarettes and e-cigarettes. If you need help quitting, ask your health care provider. It may also be helpful to avoid exposure to secondhand smoke.  Limit alcohol intake to no more than 1 drink a day for nonpregnant women and 2 drinks a day for men. One drink equals 12 oz of beer, 5 oz of wine, or 1 oz of hard liquor.  Stop any illegal drug use.  Avoid taking birth control pills. Talk to your health care provider about the risks of taking birth control pills if: ? You are over 18 years old. ? You smoke. ? You get migraines. ? You have ever had a blood clot. What other changes can be made?  Manage your cholesterol levels. ? Eating a healthy diet is important for preventing high cholesterol. If cholesterol cannot be managed through diet alone, you may also need to take medicines. ? Take any prescribed medicines to control your cholesterol as told by your health care provider.  Manage your diabetes. ? Eating a healthy diet and exercising regularly are important parts of managing your blood sugar. If your blood sugar cannot be managed through diet and exercise, you may need to take medicines. ? Take any prescribed medicines to control your diabetes as told by  your health care provider.  Control your hypertension. ? To reduce your risk of stroke, try to keep your blood pressure below 130/80. ? Eating a healthy diet and exercising regularly are an important part of controlling your blood pressure. If your blood pressure cannot be managed through diet and exercise, you may need to take medicines. ? Take any prescribed medicines to control hypertension as told by your health care provider. ? Ask your health care provider if you should monitor your blood pressure at home. ? Have your blood pressure checked every year, even if your blood pressure is normal. Blood pressure increases with age and some medical conditions.  Get evaluated for sleep disorders (sleep apnea). Talk to your health care provider about getting a sleep evaluation if you snore a lot or have excessive sleepiness.  Take over-the-counter and prescription medicines only as told by your health care provider. Aspirin or blood thinners (antiplatelets or anticoagulants) may be recommended to reduce your risk of forming blood clots that can lead to stroke.  Make sure that any other medical conditions you have, such as atrial fibrillation or atherosclerosis, are managed. What are the warning signs of a stroke? The warning signs of a stroke can be easily remembered as BEFAST.  B is for balance. Signs include: ? Dizziness. ? Loss of balance or coordination. ? Sudden trouble walking.  E is for eyes. Signs include: ? A sudden change in vision. ? Trouble seeing.  F is for face. Signs include: ? Sudden weakness or numbness of the face. ? The face or eyelid drooping to one side.  A is for arms. Signs include: ? Sudden weakness or numbness of the arm, usually on one side of the body.  S is for speech. Signs include: ? Trouble speaking (aphasia). ? Trouble understanding.  T is for time. ? These symptoms may represent a serious problem that is an emergency. Do not wait to see if the  symptoms will go away. Get medical help right away. Call your local emergency services (911 in the [place] Do not drive yourself to the hospital.  Other signs of stroke may include: ? A sudden, severe headache with no known cause. ? Nausea or vomiting. ? Seizure.  Where to find more information: For more information, visit:  American Stroke Association: www.strokeassociation.org  National Stroke Association: www.stroke.org  Summary  You can prevent a stroke by eating healthy, exercising, not smoking, limiting alcohol intake, and managing any medical conditions you may have.  Do not use any products that contain nicotine or tobacco, such as cigarettes and e-cigarettes. If you need help quitting, ask your health care provider. It may also be helpful to avoid exposure to secondhand smoke.  Remember BEFAST for warning signs of stroke. Get help right away if you or a loved one has any of these signs. This information is not intended to replace advice given to you by your health care provider. Make sure you discuss any questions you have with your health care provider. Document Released: 02/03/2004 Document Revised: 02/01/2016 Document Reviewed: 02/01/2016 Elsevier Interactive Patient Education  [employer].

## 2017-12-10 NOTE — Telephone Encounter (Signed)
health team order sen to GI. No auth they will reach out to the pt to schedule.

## 2017-12-11 LAB — LIPID PANEL
CHOL/HDL RATIO: 3.7 ratio (ref 0.0–5.0)
Cholesterol, Total: 149 mg/dL (ref 100–199)
HDL: 40 mg/dL (ref >39)
LDL Calculated: 73 mg/dL (ref 0–99)
TRIGLYCERIDES: 181 mg/dL — AB (ref 0–149)
VLDL CHOLESTEROL CAL: 36 mg/dL (ref 5–40)

## 2017-12-11 LAB — HEMOGLOBIN A1C
Est. average glucose Bld gHb Est-mCnc: 120 mg/dL
Hgb A1c MFr Bld: 5.8 % — ABNORMAL HIGH (ref 4.8–5.6)

## 2017-12-26 ENCOUNTER — Telehealth: Payer: Self-pay

## 2017-12-26 NOTE — Telephone Encounter (Signed)
-----   Message from Garvin Fila, MD sent at 12/21/2017  6:19 PM EST ----- Kindly inform the patient that screening lab work for diabetes was satisfactory. Lab work for cholesterol was borderline but acceptable. No need for medication changes

## 2017-12-26 NOTE — Telephone Encounter (Signed)
I called pt, spoke with Nicholas [name], pt's daughter, per DPR, and advised her of pt's lab results. I reminded pt's daughter of pt's MRI appts on 12/28/2017. Pt's daughter verbalized understanding of results and recommendations.

## 2017-12-28 ENCOUNTER — Ambulatory Visit
Admission: RE | Admit: 2017-12-28 | Discharge: 2017-12-28 | Disposition: A | Discharge status: Home / Self Care | Payer: PPO | Source: Ambulatory Visit | Attending: Neurology | Admitting: Neurology

## 2017-12-28 DIAGNOSIS — G459 Transient cerebral ischemic attack, unspecified: Secondary | ICD-10-CM

## 2018-01-04 DIAGNOSIS — G459 Transient cerebral ischemic attack, unspecified: Secondary | ICD-10-CM | POA: Diagnosis not present

## 2018-01-04 DIAGNOSIS — N2581 Secondary hyperparathyroidism of renal origin: Secondary | ICD-10-CM | POA: Diagnosis not present

## 2018-01-04 DIAGNOSIS — N183 Chronic kidney disease, stage 3 (moderate): Secondary | ICD-10-CM | POA: Diagnosis not present

## 2018-01-04 DIAGNOSIS — I5030 Unspecified diastolic (congestive) heart failure: Secondary | ICD-10-CM | POA: Diagnosis not present

## 2018-01-04 DIAGNOSIS — D631 Anemia in chronic kidney disease: Secondary | ICD-10-CM | POA: Diagnosis not present

## 2018-01-04 DIAGNOSIS — N179 Acute kidney failure, unspecified: Secondary | ICD-10-CM | POA: Diagnosis not present

## 2018-01-04 DIAGNOSIS — Z8709 Personal history of other diseases of the respiratory system: Secondary | ICD-10-CM | POA: Diagnosis not present

## 2018-01-04 DIAGNOSIS — E785 Hyperlipidemia, unspecified: Secondary | ICD-10-CM | POA: Diagnosis not present

## 2018-01-04 DIAGNOSIS — N189 Chronic kidney disease, unspecified: Secondary | ICD-10-CM | POA: Diagnosis not present

## 2018-01-14 ENCOUNTER — Telehealth: Payer: Self-pay

## 2018-01-14 NOTE — Telephone Encounter (Signed)
RN call Nicholas [name] on dpr.Rn stated MRA head unremarkable imaging. Continue current plan. PTs daughter verbalized understanding. ------

## 2018-01-14 NOTE — Telephone Encounter (Signed)
Correction MRA neck is unremarkable image results. Explain to daughter [name] on dpr.She verbalized understanding. ------

## 2018-02-05 DIAGNOSIS — I10 Essential (primary) hypertension: Secondary | ICD-10-CM | POA: Diagnosis not present

## 2018-02-05 DIAGNOSIS — E785 Hyperlipidemia, unspecified: Secondary | ICD-10-CM | POA: Diagnosis not present

## 2018-02-05 DIAGNOSIS — Z8673 Personal history of transient ischemic attack (TIA), and cerebral infarction without residual deficits: Secondary | ICD-10-CM | POA: Diagnosis not present

## 2018-02-05 DIAGNOSIS — I5022 Chronic systolic (congestive) heart failure: Secondary | ICD-10-CM | POA: Diagnosis not present

## 2018-02-05 DIAGNOSIS — E663 Overweight: Secondary | ICD-10-CM | POA: Diagnosis not present

## 2018-02-05 DIAGNOSIS — N189 Chronic kidney disease, unspecified: Secondary | ICD-10-CM | POA: Diagnosis not present

## 2018-02-05 DIAGNOSIS — Z6827 Body mass index (BMI) 27.0-27.9, adult: Secondary | ICD-10-CM | POA: Diagnosis not present

## 2018-02-05 DIAGNOSIS — I259 Chronic ischemic heart disease, unspecified: Secondary | ICD-10-CM | POA: Diagnosis not present

## 2018-02-05 DIAGNOSIS — J449 Chronic obstructive pulmonary disease, unspecified: Secondary | ICD-10-CM | POA: Diagnosis not present

## 2018-02-05 DIAGNOSIS — Z1331 Encounter for screening for depression: Secondary | ICD-10-CM | POA: Diagnosis not present

## 2018-02-05 DIAGNOSIS — M159 Polyosteoarthritis, unspecified: Secondary | ICD-10-CM | POA: Diagnosis not present

## 2018-02-15 DIAGNOSIS — H401132 Primary open-angle glaucoma, bilateral, moderate stage: Secondary | ICD-10-CM | POA: Diagnosis not present

## 2018-03-11 ENCOUNTER — Ambulatory Visit: Payer: PPO | Admitting: Adult Health

## 2018-03-18 DIAGNOSIS — J449 Chronic obstructive pulmonary disease, unspecified: Secondary | ICD-10-CM | POA: Diagnosis not present

## 2018-03-18 DIAGNOSIS — N401 Enlarged prostate with lower urinary tract symptoms: Secondary | ICD-10-CM | POA: Diagnosis not present

## 2018-03-18 DIAGNOSIS — Z125 Encounter for screening for malignant neoplasm of prostate: Secondary | ICD-10-CM | POA: Diagnosis not present

## 2018-03-18 DIAGNOSIS — N281 Cyst of kidney, acquired: Secondary | ICD-10-CM | POA: Diagnosis not present

## 2018-04-01 ENCOUNTER — Other Ambulatory Visit: Payer: Self-pay

## 2018-04-01 MED ORDER — PANTOPRAZOLE SODIUM 20 MG PO TBEC: 20.0000 mg | DELAYED_RELEASE_TABLET | Freq: Every day | ORAL | 3 refills | Status: DC

## 2018-04-05 ENCOUNTER — Ambulatory Visit: Payer: PPO | Admitting: Adult Health

## 2018-05-29 DIAGNOSIS — M159 Polyosteoarthritis, unspecified: Secondary | ICD-10-CM | POA: Diagnosis not present

## 2018-05-29 DIAGNOSIS — K219 Gastro-esophageal reflux disease without esophagitis: Secondary | ICD-10-CM | POA: Diagnosis not present

## 2018-05-29 DIAGNOSIS — E663 Overweight: Secondary | ICD-10-CM | POA: Diagnosis not present

## 2018-05-29 DIAGNOSIS — I4510 Unspecified right bundle-branch block: Secondary | ICD-10-CM | POA: Diagnosis not present

## 2018-05-29 DIAGNOSIS — N189 Chronic kidney disease, unspecified: Secondary | ICD-10-CM | POA: Diagnosis not present

## 2018-05-29 DIAGNOSIS — E785 Hyperlipidemia, unspecified: Secondary | ICD-10-CM | POA: Diagnosis not present

## 2018-05-29 DIAGNOSIS — I259 Chronic ischemic heart disease, unspecified: Secondary | ICD-10-CM | POA: Diagnosis not present

## 2018-05-29 DIAGNOSIS — I5022 Chronic systolic (congestive) heart failure: Secondary | ICD-10-CM | POA: Diagnosis not present

## 2018-05-29 DIAGNOSIS — J449 Chronic obstructive pulmonary disease, unspecified: Secondary | ICD-10-CM | POA: Diagnosis not present

## 2018-05-29 DIAGNOSIS — Z6827 Body mass index (BMI) 27.0-27.9, adult: Secondary | ICD-10-CM | POA: Diagnosis not present

## 2018-05-29 DIAGNOSIS — M109 Gout, unspecified: Secondary | ICD-10-CM | POA: Diagnosis not present

## 2018-05-29 DIAGNOSIS — I10 Essential (primary) hypertension: Secondary | ICD-10-CM | POA: Diagnosis not present

## 2018-07-15 ENCOUNTER — Encounter (HOSPITAL_COMMUNITY): Payer: Self-pay

## 2018-07-15 ENCOUNTER — Other Ambulatory Visit: Payer: Self-pay

## 2018-07-15 ENCOUNTER — Emergency Department (HOSPITAL_COMMUNITY)
Admission: EM | Admit: 2018-07-15 | Discharge: 2018-07-16 | Disposition: A | Discharge status: Home / Self Care | Payer: PPO | Source: Home / Self Care | Attending: Emergency Medicine | Admitting: Emergency Medicine

## 2018-07-15 ENCOUNTER — Emergency Department (HOSPITAL_COMMUNITY): Payer: PPO

## 2018-07-15 DIAGNOSIS — R4020 Unspecified coma: Secondary | ICD-10-CM | POA: Diagnosis not present

## 2018-07-15 DIAGNOSIS — R270 Ataxia, unspecified: Secondary | ICD-10-CM | POA: Diagnosis not present

## 2018-07-15 DIAGNOSIS — R0989 Other specified symptoms and signs involving the circulatory and respiratory systems: Secondary | ICD-10-CM

## 2018-07-15 DIAGNOSIS — U071 COVID-19: Secondary | ICD-10-CM | POA: Diagnosis not present

## 2018-07-15 DIAGNOSIS — R531 Weakness: Secondary | ICD-10-CM | POA: Insufficient documentation

## 2018-07-15 DIAGNOSIS — R4789 Other speech disturbances: Secondary | ICD-10-CM | POA: Diagnosis not present

## 2018-07-15 DIAGNOSIS — I4891 Unspecified atrial fibrillation: Secondary | ICD-10-CM | POA: Diagnosis not present

## 2018-07-15 DIAGNOSIS — S79912A Unspecified injury of left hip, initial encounter: Secondary | ICD-10-CM | POA: Diagnosis not present

## 2018-07-15 DIAGNOSIS — N183 Chronic kidney disease, stage 3 (moderate): Secondary | ICD-10-CM | POA: Insufficient documentation

## 2018-07-15 DIAGNOSIS — Z87891 Personal history of nicotine dependence: Secondary | ICD-10-CM | POA: Insufficient documentation

## 2018-07-15 DIAGNOSIS — I509 Heart failure, unspecified: Secondary | ICD-10-CM | POA: Insufficient documentation

## 2018-07-15 DIAGNOSIS — R4781 Slurred speech: Secondary | ICD-10-CM | POA: Diagnosis not present

## 2018-07-15 DIAGNOSIS — Z79899 Other long term (current) drug therapy: Secondary | ICD-10-CM | POA: Insufficient documentation

## 2018-07-15 DIAGNOSIS — M25552 Pain in left hip: Secondary | ICD-10-CM | POA: Diagnosis not present

## 2018-07-15 DIAGNOSIS — G459 Transient cerebral ischemic attack, unspecified: Secondary | ICD-10-CM | POA: Diagnosis not present

## 2018-07-15 DIAGNOSIS — J811 Chronic pulmonary edema: Secondary | ICD-10-CM | POA: Insufficient documentation

## 2018-07-15 DIAGNOSIS — S51011A Laceration without foreign body of right elbow, initial encounter: Secondary | ICD-10-CM | POA: Diagnosis not present

## 2018-07-15 DIAGNOSIS — R4182 Altered mental status, unspecified: Secondary | ICD-10-CM | POA: Diagnosis not present

## 2018-07-15 DIAGNOSIS — I130 Hypertensive heart and chronic kidney disease with heart failure and stage 1 through stage 4 chronic kidney disease, or unspecified chronic kidney disease: Secondary | ICD-10-CM | POA: Insufficient documentation

## 2018-07-15 DIAGNOSIS — Z8673 Personal history of transient ischemic attack (TIA), and cerebral infarction without residual deficits: Secondary | ICD-10-CM | POA: Insufficient documentation

## 2018-07-15 LAB — CBC
HCT: 39.4 % (ref 39.0–52.0)
Hemoglobin: 12.4 g/dL — ABNORMAL LOW (ref 13.0–17.0)
MCH: 27.4 pg (ref 26.0–34.0)
MCHC: 31.5 g/dL (ref 30.0–36.0)
MCV: 87.2 fL (ref 80.0–100.0)
Platelets: 228 10*3/uL (ref 150–400)
RBC: 4.52 MIL/uL (ref 4.22–5.81)
RDW: 14.3 % (ref 11.5–15.5)
WBC: 8.1 10*3/uL (ref 4.0–10.5)
nRBC: 0 % (ref 0.0–0.2)

## 2018-07-15 LAB — DIFFERENTIAL
Abs Immature Granulocytes: 0.03 10*3/uL (ref 0.00–0.07)
Basophils Absolute: 0 10*3/uL (ref 0.0–0.1)
Basophils Relative: 0 %
Eosinophils Absolute: 0 10*3/uL (ref 0.0–0.5)
Eosinophils Relative: 0 %
Immature Granulocytes: 0 %
Lymphocytes Relative: 28 %
Lymphs Abs: 2.3 10*3/uL (ref 0.7–4.0)
Monocytes Absolute: 0.7 10*3/uL (ref 0.1–1.0)
Monocytes Relative: 9 %
Neutro Abs: 5.1 10*3/uL (ref 1.7–7.7)
Neutrophils Relative %: 63 %

## 2018-07-15 LAB — COMPREHENSIVE METABOLIC PANEL
ALT: 20 U/L (ref 0–44)
AST: 24 U/L (ref 15–41)
Albumin: 3.3 g/dL — ABNORMAL LOW (ref 3.5–5.0)
Alkaline Phosphatase: 88 U/L (ref 38–126)
Anion gap: 9 (ref 5–15)
BUN: 18 mg/dL (ref 8–23)
CO2: 21 mmol/L — ABNORMAL LOW (ref 22–32)
Calcium: 8.4 mg/dL — ABNORMAL LOW (ref 8.9–10.3)
Chloride: 108 mmol/L (ref 98–111)
Creatinine, Ser: 1.76 mg/dL — ABNORMAL HIGH (ref 0.61–1.24)
GFR calc Af Amer: 41 mL/min — ABNORMAL LOW (ref >60)
GFR calc non Af Amer: 35 mL/min — ABNORMAL LOW (ref >60)
Glucose, Bld: 94 mg/dL (ref 70–99)
Potassium: 3.6 mmol/L (ref 3.5–5.1)
Sodium: 138 mmol/L (ref 135–145)
Total Bilirubin: 0.5 mg/dL (ref 0.3–1.2)
Total Protein: 5.9 g/dL — ABNORMAL LOW (ref 6.5–8.1)

## 2018-07-15 LAB — PROTIME-INR
INR: 1 (ref 0.8–1.2)
Prothrombin Time: 13.3 seconds (ref 11.4–15.2)

## 2018-07-15 LAB — I-STAT CHEM 8, ED
BUN: 19 mg/dL (ref 8–23)
Calcium, Ion: 1.18 mmol/L (ref 1.15–1.40)
Chloride: 108 mmol/L (ref 98–111)
Creatinine, Ser: 1.7 mg/dL — ABNORMAL HIGH (ref 0.61–1.24)
Glucose, Bld: 89 mg/dL (ref 70–99)
HCT: 38 % — ABNORMAL LOW (ref 39.0–52.0)
Hemoglobin: 12.9 g/dL — ABNORMAL LOW (ref 13.0–17.0)
Potassium: 3.6 mmol/L (ref 3.5–5.1)
Sodium: 140 mmol/L (ref 135–145)
TCO2: 23 mmol/L (ref 22–32)

## 2018-07-15 LAB — APTT: aPTT: 34 seconds (ref 24–36)

## 2018-07-15 LAB — CBG MONITORING, ED: Glucose-Capillary: 83 mg/dL (ref 70–99)

## 2018-07-15 MED ORDER — SODIUM CHLORIDE 0.9 % IV BOLUS
500.0000 mL | Freq: Once | INTRAVENOUS | Status: AC
  Administered 2018-07-16: 500 mL via INTRAVENOUS

## 2018-07-15 MED ORDER — SODIUM CHLORIDE 0.9% FLUSH: 3.0000 mL | Freq: Once | INTRAVENOUS | Status: DC

## 2018-07-15 NOTE — ED Notes (Signed)
Called patient to move to Triage. Unable to locate patient at this time.

## 2018-07-15 NOTE — ED Triage Notes (Signed)
Pt BIB grandson for eval of "not being normal self" x 2-3 days. Was brought to UC by son and they sent here for further eval. Pt has hx of TIA, no acute neuro deficits noted in triage, pt does c/o HA, states 6-10. No fevers at home.    [facility] CONTACT [phone]

## 2018-07-16 ENCOUNTER — Emergency Department (HOSPITAL_COMMUNITY): Payer: PPO

## 2018-07-16 DIAGNOSIS — R0989 Other specified symptoms and signs involving the circulatory and respiratory systems: Secondary | ICD-10-CM | POA: Diagnosis not present

## 2018-07-16 DIAGNOSIS — R270 Ataxia, unspecified: Secondary | ICD-10-CM | POA: Diagnosis not present

## 2018-07-16 LAB — URINALYSIS, ROUTINE W REFLEX MICROSCOPIC
Bacteria, UA: NONE SEEN
Bilirubin Urine: NEGATIVE
Glucose, UA: NEGATIVE mg/dL
Hgb urine dipstick: NEGATIVE
Ketones, ur: NEGATIVE mg/dL
Leukocytes,Ua: NEGATIVE
Nitrite: NEGATIVE
Protein, ur: 100 mg/dL — AB
Specific Gravity, Urine: 1.021 (ref 1.005–1.030)
pH: 5 (ref 5.0–8.0)

## 2018-07-16 LAB — URINE CULTURE: Culture: NO GROWTH

## 2018-07-16 LAB — BRAIN NATRIURETIC PEPTIDE: B Natriuretic Peptide: 101.1 pg/mL — ABNORMAL HIGH (ref 0.0–100.0)

## 2018-07-16 LAB — TROPONIN I (HIGH SENSITIVITY)
Troponin I (High Sensitivity): 19 ng/L — ABNORMAL HIGH (ref <18)
Troponin I (High Sensitivity): 20 ng/L — ABNORMAL HIGH (ref <18)

## 2018-07-16 MED ORDER — FUROSEMIDE 20 MG PO TABS: 20.0000 mg | ORAL_TABLET | Freq: Every day | ORAL | 0 refills | Status: DC

## 2018-07-16 NOTE — ED Notes (Addendum)
Ambulated pt with pulse oxi. O2 was 93-94. When pt came back to room, it dropped down to 90-91. Pt is currently back in bed with O2 of 94.

## 2018-07-16 NOTE — ED Provider Notes (Addendum)
[facility] EMERGENCY DEPARTMENT Provider Note   CSN: [id number] Arrival date & time: 07/15/18  2020    History   Chief Complaint Chief Complaint  Patient presents with  . Altered Mental Status    HPI Nicholas [name] is a 85 y.o. male.     HPI 85 year old male with history of CKD, hypertension, TIA brought into the ER for evaluation of weakness, dizziness.  According to the patient he was feeling off balance today when he was walking.  He had some dizziness but denied any feeling of chest pain, shortness of breath, palpitation or even spinning sensation.  He states that he had been working in the yard this weekend that he thinks might have contributed.  I spoke with patient's grandson with whom he lives.  [name] informed him that he was out of town and when he returned home his grandfather was appearing weak and laying down a lot, which is unusual for him.  On Sunday he was noted to be a little unsteady with his walk and complaining of headaches off and on.  Patient was still not doing well today so he took him to an urgent care, and they advised him to come to the ER for further neurologic evaluation.  Patient has had TIAs in the past with similar vague symptoms.  He was seen by Dr. Leonie Man in December and MRI was completed.  Patient was started on Plavix at that time.  Patient's grandson has not seen any slurring of the speech, one-sided weakness, numbness.  Patient denies all of those complaints and in addition states that he is not having any new vision changes either.  Review of system is also negative for any fevers, chills, nausea, vomiting, abdominal pain, recent falls.  Past Medical History:  Diagnosis Date  . Anemia    none recent  . Arthritis   . Asthma   . Chronic kidney disease    ckd stage 3  . Family history of adverse reaction to anesthesia    daughter has ponv   . Hypertension     Patient Active Problem List   Diagnosis Date Noted  .  Renal insufficiency 11/27/2017  . Hyperlipidemia 11/02/2017  . TIA (transient ischemic attack) 11/01/2017  . CHF (congestive heart failure) ([place]) 11/01/2017  . HTN (hypertension) 11/01/2017    Past Surgical History:  Procedure Laterality Date  . COLONOSCOPY WITH PROPOFOL N/A 02/03/2015   Procedure: COLONOSCOPY WITH PROPOFOL;  Surgeon: Arta Silence, MD;  Location: [facility];  Service: Endoscopy;  Laterality: N/A;  . KNEE ARTHROSCOPY Left   . ROTATOR CUFF REPAIR Right   . SHOULDER ARTHROSCOPY Left         Home Medications    Prior to Admission medications   Medication Sig Start Date End Date Taking? Authorizing Provider  acetaminophen (TYLENOL) 500 MG tablet Take 500-1,000 mg by mouth every 8 (eight) hours as needed (for pain or headaches).    Yes [provider]  albuterol (PROVENTIL HFA;VENTOLIN HFA) 108 (90 Base) MCG/ACT inhaler Inhale 2 puffs into the lungs See admin instructions. Inhale 2 puffs into the lungs in the morning and 2 puffs at bedtime and may be used every 4 hours as needed for shortness of breath or wheezing   Yes [provider]  allopurinol (ZYLOPRIM) 100 MG tablet Take 100 mg by mouth daily.   Yes [provider]  amLODipine (NORVASC) 10 MG tablet Take 5 mg by mouth daily. 10/26/17  Yes [provider]  bisoprolol (ZEBETA) 5 MG tablet Take 2.5 mg by mouth every morning.    Yes [provider]  Calcium Carb-Cholecalciferol (CALCIUM 600+D3 PO) Take 2 tablets by mouth daily.   Yes [provider]  clopidogrel (PLAVIX) 75 MG tablet Take 1 tablet (75 mg total) by mouth daily. 12/10/17  Yes Garvin Fila, MD  Fluticasone-Salmeterol (ADVAIR) 250-50 MCG/DOSE AEPB Inhale 1 puff into the lungs 2 (two) times daily.   Yes [provider]  hydrALAZINE (APRESOLINE) 10 MG tablet Take 1 tablet (10 mg total) by mouth 3 (three) times daily. 11/03/17  Yes Hosie Poisson, MD  latanoprost (XALATAN) 0.005 % ophthalmic  solution Place 1 drop into both eyes at bedtime. 07/26/17  Yes [provider]  Multiple Vitamin (MULTIVITAMIN WITH MINERALS) TABS tablet Take 1 tablet by mouth every morning.   Yes [provider]  Omega-3 Fatty Acids (FISH OIL) 1000 MG CAPS Take 1,000 mg by mouth daily.   Yes [provider]  furosemide (LASIX) 20 MG tablet Take 1 tablet (20 mg total) by mouth daily. 07/16/18   Varney Biles, MD  pantoprazole (PROTONIX) 20 MG tablet Take 1 tablet (20 mg total) by mouth daily. Patient not taking: Reported on 07/16/2018 04/01/18   Garvin Fila, MD    Family History History reviewed. No pertinent family history.  Social History Social History   Tobacco Use  . Smoking status: Former Research scientist (life sciences)  . Smokeless tobacco: Never Used  . Tobacco comment: quit 35 yrs ago  Substance Use Topics  . Alcohol use: No  . Drug use: No     Allergies   Penicillins   Review of Systems Review of Systems  Constitutional: Positive for activity change.  Respiratory: Negative for shortness of breath.   Cardiovascular: Negative for chest pain and palpitations.  Gastrointestinal: Negative for abdominal pain, nausea and vomiting.  Genitourinary: Negative for dysuria and flank pain.  Musculoskeletal: Negative for neck pain and neck stiffness.  Allergic/Immunologic: Negative for immunocompromised state.  Neurological: Positive for light-headedness and headaches. Negative for tremors, seizures, syncope, speech difficulty, weakness and numbness.  Hematological: Does not bruise/bleed easily.  All other systems reviewed and are negative.    Physical Exam Updated Vital Signs BP (!) 148/73   Pulse 73   Temp 98.4 F (36.9 C) (Oral)   Resp (!) 21   Wt 90 kg   SpO2 95%   BMI 29.30 kg/m   Physical Exam Vitals signs and nursing note reviewed.  Constitutional:      Appearance: He is well-developed.  HENT:     Head: Atraumatic.  Eyes:     Extraocular Movements: Extraocular  movements intact.     Pupils: Pupils are equal, round, and reactive to light.  Neck:     Musculoskeletal: Neck supple.     Vascular: No carotid bruit.     Comments: No meningismus Cardiovascular:     Rate and Rhythm: Normal rate.     Heart sounds: Murmur present.  Pulmonary:     Effort: Pulmonary effort is normal.  Abdominal:     General: Abdomen is flat.     Palpations: Abdomen is soft. There is no mass.     Tenderness: There is no abdominal tenderness.  Musculoskeletal:     Right lower leg: No edema.     Left lower leg: No edema.  Skin:    General: Skin is warm.  Neurological:     Mental Status: He is alert and oriented to person,  place, and time.     Cranial Nerves: No cranial nerve deficit.     Sensory: No sensory deficit.     Motor: No weakness.     Coordination: Coordination normal.      ED Treatments / Results  Labs (all labs ordered are listed, but only abnormal results are displayed) Labs Reviewed  CBC - Abnormal; Notable for the following components:      Result Value   Hemoglobin 12.4 (*)    All other components within normal limits  COMPREHENSIVE METABOLIC PANEL - Abnormal; Notable for the following components:   CO2 21 (*)    Creatinine, Ser 1.76 (*)    Calcium 8.4 (*)    Total Protein 5.9 (*)    Albumin 3.3 (*)    GFR calc non Af Amer 35 (*)    GFR calc Af Amer 41 (*)    All other components within normal limits  URINALYSIS, ROUTINE W REFLEX MICROSCOPIC - Abnormal; Notable for the following components:   Protein, ur 100 (*)    All other components within normal limits  TROPONIN I (HIGH SENSITIVITY) - Abnormal; Notable for the following components:   Troponin I (High Sensitivity) 19 (*)    All other components within normal limits  TROPONIN I (HIGH SENSITIVITY) - Abnormal; Notable for the following components:   Troponin I (High Sensitivity) 20 (*)    All other components within normal limits  BRAIN NATRIURETIC PEPTIDE - Abnormal; Notable for the  following components:   B Natriuretic Peptide 101.1 (*)    All other components within normal limits  I-STAT CHEM 8, ED - Abnormal; Notable for the following components:   Creatinine, Ser 1.70 (*)    Hemoglobin 12.9 (*)    HCT 38.0 (*)    All other components within normal limits  URINE CULTURE  PROTIME-INR  APTT  DIFFERENTIAL  CBG MONITORING, ED    EKG EKG Interpretation  Date/Time:  Tuesday July 16 2018 00:42:48 EDT Ventricular Rate:  72 PR Interval:    QRS Duration: 157 QT Interval:  422 QTC Calculation: 462 R Axis:   -76 Text Interpretation:  Sinus rhythm Atrial premature complex RBBB and LAFB No acute changes No significant change since last tracing Confirmed by Varney Biles [phone]) on 07/16/2018 2:27:45 AM   Date: 07/16/2018  Rate: 94  Rhythm: normal sinus rhythm  QRS Axis: normal  Intervals: normal  ST/T Wave abnormalities: normal  Conduction Disutrbances: none  Narrative Interpretation: unremarkable   Radiology Ct Head Wo Contrast  Result Date: 07/15/2018 CLINICAL DATA:  Altered level consciousness, history of TIA EXAM: CT HEAD WITHOUT CONTRAST TECHNIQUE: Contiguous axial images were obtained from the base of the skull through the vertex without intravenous contrast. COMPARISON:  None. FINDINGS: Brain: No evidence of acute infarction, hemorrhage, hydrocephalus, extra-axial collection or mass lesion/mass effect. 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 Vascular: No hyperdense vessel or unexpected calcification. Skull: Normal. Negative for fracture or focal lesion. Sinuses/Orbits: No acute finding. Other: None. IMPRESSION: 1.  No acute intracranial pathology. 2. 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 Electronically Signed   By: Eddie Candle M.D.   On: 07/15/2018 21:51   Mr Brain Wo Contrast  Result Date: 07/16/2018 CLINICAL DATA:  Ataxia.  Stroke suspected. EXAM: MRI HEAD WITHOUT CONTRAST TECHNIQUE: Multiplanar, multiecho pulse  sequences of the brain and surrounding structures were obtained without intravenous contrast. COMPARISON:  MRI brain 11/01/2017.  CT head 07/15/2018 FINDINGS: Brain: No acute infarct, hemorrhage,  or mass lesion is present. Atrophy disproportionately affecting the parietal lobes is again seen. Scattered white matter changes are stable. 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 The fourth ventricle and vermis are normal. Brainstem is unremarkable. The ventricles are of normal size. No significant extraaxial fluid collection is present. Vascular: Flow is present in the major intracranial arteries. 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 Craniocervical junction is otherwise normal. Sinuses/Orbits: A fluid level is present in the right maxillary sinus. Diffuse mucosal thickening is present throughout the ethmoid air cells. Sphenoid sinuses are clear. Chronic proptosis is stable bilaterally. Globes and orbits are otherwise within normal limits. IMPRESSION: 1. No acute intracranial abnormality or significant interval change. No focal etiology to explain ataxia. 2. Stable atrophy and white matter disease. 3. Mega cisterna magna again noted. 4. Acute sinusitis. Electronically Signed   By: San Morelle M.D.   On: 07/16/2018 04:54   Dg Chest Port 1 View  Result Date: 07/16/2018 CLINICAL DATA:  Weakness and dizziness. EXAM: PORTABLE CHEST 1 VIEW COMPARISON:  Two-view chest x-ray 11/01/2017 FINDINGS: Low lung volumes are present. The heart is upper limits of normal. Atherosclerotic changes are noted at the aortic arch. Moderate pulmonary vascular congestion is present. There is no frank edema or effusion. No significant airspace consolidation is present. Right shoulder hemiarthroplasty is again noted. IMPRESSION: 1. Mild pulmonary vascular congestion and low lung volumes. 2. Aortic atherosclerosis. Electronically Signed   By: San Morelle M.D.   On: 07/16/2018 06:11     Procedures Procedures (including critical care time)  Medications Ordered in ED Medications  sodium chloride flush (NS) 0.9 % injection 3 mL (has no administration in time range)  sodium chloride 0.9 % bolus 500 mL (0 mLs Intravenous Stopped 07/16/18 0312)     Initial Impression / Assessment and Plan / ED Course  I have reviewed the triage vital signs and the nursing notes.  Pertinent labs & imaging results that were available during my care of the patient were reviewed by me and considered in my medical decision making (see chart for details).  Clinical Course as of Jul 16 639  Tue Jul 16, 2018  0514 Patient reassessed.  MRI results are not showing any acute findings.  Patient is noted to be slightly tachypneic without being in respiratory distress during my reassessment.  Upon ambulation he was noted to have O2 sats drop as low as 92%.  We will add a chest x-ray.   [AN]  0640 BNP is only slightly elevated.  Chest x-ray does show some vascular congestion. The patient appears reasonably screened and/or stabilized for discharge and I doubt any other medical condition or other [facility] requiring further screening, evaluation, or treatment in the ED at this time prior to discharge.  Results from the ER workup discussed with the patient face to face and all questions answered to the best of my ability. The patient is safe for discharge with strict return precautions.    DG Chest Port 1 View [AN]    Clinical Course User Index [AN] Varney Biles, MD       85 year old comes in a chief complaint of dizziness and weakness.  Symptoms have been going on for 2 to 3 days and there is intermittent headaches.  Patient has history of TIAs, CKD and it appears that he was outside doing some yard work before he started feeling sick.  Differential diagnosis includes electrolyte abnormalities, orthostatic dizziness, TIA, stroke, UTI.  Meds reviewed, and I doubt  that he has medication side effects.   Patient's abdomen is soft and nontender.  UA ordered, but clinical suspicion for UTI is low.  I discussed the case with his grandson with whom he lives.  I informed the grandson that the symptoms are vague and there is no clear diagnosis at this time.  Shared with him that the lab work-up has come back negative.  He has been informed that we will add urine analysis, high sensitive troponin and EKG to his work-up along with orthostatics.  He is comfortable with patient coming back home if results are negative.  I assured him that we will also check with neurology before making a disposition.  I spoke with Dr. Lorraine Lax, neurology.  We discussed patient's awake presentation and concerns for possible TIA.  We also discussed patient's last MRA along with his medication change.  Dr. Lorraine Lax recommends that we get an MRI of the brain to make sure there is no stroke, which if negative then patient can be followed up as an outpatient by Dr. Leonie Man.  Final Clinical Impressions(s) / ED Diagnoses   Final diagnoses:  Weakness  Pulmonary vascular congestion    ED Discharge Orders         Ordered    furosemide (LASIX) 20 MG tablet  Daily     07/16/18 0639           Varney Biles, MD 07/16/18 5945    Varney Biles, MD 07/16/18 [phone]

## 2018-07-16 NOTE — ED Notes (Signed)
Patient aware that we need urine sample for testing, unable at this time (attempted). Pt given instruction on providing urine sample when able to do so.

## 2018-07-16 NOTE — ED Notes (Signed)
Pt is in MRI  

## 2018-07-16 NOTE — Discharge Instructions (Addendum)
You are seen in the ER for weakness and confusion. The results in the ER did not reveal any signs of brain bleed, stroke or infection. Chest x-ray did show that there is mild congestion in your lungs, which could be because of congestive heart failure.  We are sending you home with only 3 days worth of Lasix or water pill.  We recommend that you follow-up with your primary care doctor in 3 to 5 days.  Please return to the ER if your symptoms worsen; you have any increased shortness of breath, dizziness, falls, fainting, new one-sided neurologic symptoms like weakness or numbness, slurring of her speech.

## 2018-07-18 ENCOUNTER — Other Ambulatory Visit: Payer: Self-pay

## 2018-07-18 ENCOUNTER — Emergency Department (HOSPITAL_COMMUNITY): Payer: PPO

## 2018-07-18 ENCOUNTER — Encounter (HOSPITAL_COMMUNITY): Payer: Self-pay | Admitting: Emergency Medicine

## 2018-07-18 ENCOUNTER — Inpatient Hospital Stay (HOSPITAL_COMMUNITY): Payer: PPO

## 2018-07-18 ENCOUNTER — Inpatient Hospital Stay (HOSPITAL_COMMUNITY)
Admission: EM | Admit: 2018-07-18 | Discharge: 2018-07-21 | DRG: 177 | Disposition: A | Discharge status: Home Health | Payer: PPO | Attending: Internal Medicine | Admitting: Internal Medicine

## 2018-07-18 ENCOUNTER — Observation Stay (HOSPITAL_COMMUNITY): Payer: PPO

## 2018-07-18 DIAGNOSIS — R404 Transient alteration of awareness: Secondary | ICD-10-CM | POA: Diagnosis not present

## 2018-07-18 DIAGNOSIS — R58 Hemorrhage, not elsewhere classified: Secondary | ICD-10-CM | POA: Diagnosis not present

## 2018-07-18 DIAGNOSIS — E785 Hyperlipidemia, unspecified: Secondary | ICD-10-CM

## 2018-07-18 DIAGNOSIS — Z6828 Body mass index (BMI) 28.0-28.9, adult: Secondary | ICD-10-CM

## 2018-07-18 DIAGNOSIS — R451 Restlessness and agitation: Secondary | ICD-10-CM | POA: Diagnosis not present

## 2018-07-18 DIAGNOSIS — R4781 Slurred speech: Secondary | ICD-10-CM | POA: Diagnosis not present

## 2018-07-18 DIAGNOSIS — S79912A Unspecified injury of left hip, initial encounter: Secondary | ICD-10-CM | POA: Diagnosis not present

## 2018-07-18 DIAGNOSIS — J45909 Unspecified asthma, uncomplicated: Secondary | ICD-10-CM

## 2018-07-18 DIAGNOSIS — N1832 Chronic kidney disease, stage 3b: Secondary | ICD-10-CM

## 2018-07-18 DIAGNOSIS — R918 Other nonspecific abnormal finding of lung field: Secondary | ICD-10-CM | POA: Diagnosis not present

## 2018-07-18 DIAGNOSIS — I4891 Unspecified atrial fibrillation: Secondary | ICD-10-CM | POA: Diagnosis present

## 2018-07-18 DIAGNOSIS — Z833 Family history of diabetes mellitus: Secondary | ICD-10-CM

## 2018-07-18 DIAGNOSIS — F919 Conduct disorder, unspecified: Secondary | ICD-10-CM | POA: Diagnosis not present

## 2018-07-18 DIAGNOSIS — N183 Chronic kidney disease, stage 3 (moderate): Secondary | ICD-10-CM

## 2018-07-18 DIAGNOSIS — N184 Chronic kidney disease, stage 4 (severe): Secondary | ICD-10-CM

## 2018-07-18 DIAGNOSIS — R479 Unspecified speech disturbances: Secondary | ICD-10-CM

## 2018-07-18 DIAGNOSIS — M25552 Pain in left hip: Secondary | ICD-10-CM | POA: Diagnosis not present

## 2018-07-18 DIAGNOSIS — F40240 Claustrophobia: Secondary | ICD-10-CM | POA: Diagnosis not present

## 2018-07-18 DIAGNOSIS — I129 Hypertensive chronic kidney disease with stage 1 through stage 4 chronic kidney disease, or unspecified chronic kidney disease: Secondary | ICD-10-CM | POA: Diagnosis present

## 2018-07-18 DIAGNOSIS — R410 Disorientation, unspecified: Secondary | ICD-10-CM | POA: Diagnosis not present

## 2018-07-18 DIAGNOSIS — S51011A Laceration without foreign body of right elbow, initial encounter: Secondary | ICD-10-CM | POA: Diagnosis not present

## 2018-07-18 DIAGNOSIS — Z8249 Family history of ischemic heart disease and other diseases of the circulatory system: Secondary | ICD-10-CM

## 2018-07-18 DIAGNOSIS — J019 Acute sinusitis, unspecified: Secondary | ICD-10-CM | POA: Diagnosis present

## 2018-07-18 DIAGNOSIS — Z8673 Personal history of transient ischemic attack (TIA), and cerebral infarction without residual deficits: Secondary | ICD-10-CM

## 2018-07-18 DIAGNOSIS — G459 Transient cerebral ischemic attack, unspecified: Secondary | ICD-10-CM | POA: Diagnosis present

## 2018-07-18 DIAGNOSIS — H9190 Unspecified hearing loss, unspecified ear: Secondary | ICD-10-CM

## 2018-07-18 DIAGNOSIS — N289 Disorder of kidney and ureter, unspecified: Secondary | ICD-10-CM

## 2018-07-18 DIAGNOSIS — U071 COVID-19: Secondary | ICD-10-CM | POA: Diagnosis not present

## 2018-07-18 DIAGNOSIS — Z880 Allergy status to penicillin: Secondary | ICD-10-CM

## 2018-07-18 DIAGNOSIS — R4789 Other speech disturbances: Secondary | ICD-10-CM | POA: Diagnosis not present

## 2018-07-18 DIAGNOSIS — H409 Unspecified glaucoma: Secondary | ICD-10-CM | POA: Diagnosis present

## 2018-07-18 DIAGNOSIS — R4701 Aphasia: Secondary | ICD-10-CM

## 2018-07-18 DIAGNOSIS — Z7902 Long term (current) use of antithrombotics/antiplatelets: Secondary | ICD-10-CM

## 2018-07-18 DIAGNOSIS — R0902 Hypoxemia: Secondary | ICD-10-CM | POA: Diagnosis not present

## 2018-07-18 DIAGNOSIS — Z823 Family history of stroke: Secondary | ICD-10-CM

## 2018-07-18 DIAGNOSIS — Z87891 Personal history of nicotine dependence: Secondary | ICD-10-CM

## 2018-07-18 DIAGNOSIS — I10 Essential (primary) hypertension: Secondary | ICD-10-CM | POA: Diagnosis present

## 2018-07-18 DIAGNOSIS — R0600 Dyspnea, unspecified: Secondary | ICD-10-CM

## 2018-07-18 DIAGNOSIS — I4510 Unspecified right bundle-branch block: Secondary | ICD-10-CM

## 2018-07-18 DIAGNOSIS — Z79899 Other long term (current) drug therapy: Secondary | ICD-10-CM

## 2018-07-18 DIAGNOSIS — E669 Obesity, unspecified: Secondary | ICD-10-CM | POA: Diagnosis present

## 2018-07-18 DIAGNOSIS — W19XXXA Unspecified fall, initial encounter: Secondary | ICD-10-CM | POA: Diagnosis present

## 2018-07-18 DIAGNOSIS — J1289 Other viral pneumonia: Secondary | ICD-10-CM | POA: Diagnosis present

## 2018-07-18 DIAGNOSIS — I509 Heart failure, unspecified: Secondary | ICD-10-CM

## 2018-07-18 HISTORY — DX: Unspecified glaucoma: H40.9

## 2018-07-18 LAB — COMPREHENSIVE METABOLIC PANEL
ALT: 22 U/L (ref 0–44)
AST: 31 U/L (ref 15–41)
Albumin: 2.9 g/dL — ABNORMAL LOW (ref 3.5–5.0)
Alkaline Phosphatase: 72 U/L (ref 38–126)
Anion gap: 11 (ref 5–15)
BUN: 29 mg/dL — ABNORMAL HIGH (ref 8–23)
CO2: 19 mmol/L — ABNORMAL LOW (ref 22–32)
Calcium: 8.1 mg/dL — ABNORMAL LOW (ref 8.9–10.3)
Chloride: 111 mmol/L (ref 98–111)
Creatinine, Ser: 1.94 mg/dL — ABNORMAL HIGH (ref 0.61–1.24)
GFR calc Af Amer: 36 mL/min — ABNORMAL LOW (ref >60)
GFR calc non Af Amer: 31 mL/min — ABNORMAL LOW (ref >60)
Glucose, Bld: 97 mg/dL (ref 70–99)
Potassium: 3.6 mmol/L (ref 3.5–5.1)
Sodium: 141 mmol/L (ref 135–145)
Total Bilirubin: 0.6 mg/dL (ref 0.3–1.2)
Total Protein: 5.6 g/dL — ABNORMAL LOW (ref 6.5–8.1)

## 2018-07-18 LAB — URINALYSIS, ROUTINE W REFLEX MICROSCOPIC
Bacteria, UA: NONE SEEN
Bilirubin Urine: NEGATIVE
Glucose, UA: NEGATIVE mg/dL
Hgb urine dipstick: NEGATIVE
Ketones, ur: NEGATIVE mg/dL
Leukocytes,Ua: NEGATIVE
Nitrite: NEGATIVE
Protein, ur: 100 mg/dL — AB
Specific Gravity, Urine: 1.019 (ref 1.005–1.030)
pH: 5 (ref 5.0–8.0)

## 2018-07-18 LAB — CBC
HCT: 39.3 % (ref 39.0–52.0)
Hemoglobin: 12.6 g/dL — ABNORMAL LOW (ref 13.0–17.0)
MCH: 27.6 pg (ref 26.0–34.0)
MCHC: 32.1 g/dL (ref 30.0–36.0)
MCV: 86.2 fL (ref 80.0–100.0)
Platelets: 173 10*3/uL (ref 150–400)
RBC: 4.56 MIL/uL (ref 4.22–5.81)
RDW: 14 % (ref 11.5–15.5)
WBC: 5.5 10*3/uL (ref 4.0–10.5)
nRBC: 0 % (ref 0.0–0.2)

## 2018-07-18 LAB — DIFFERENTIAL
Abs Immature Granulocytes: 0.03 10*3/uL (ref 0.00–0.07)
Basophils Absolute: 0 10*3/uL (ref 0.0–0.1)
Basophils Relative: 0 %
Eosinophils Absolute: 0 10*3/uL (ref 0.0–0.5)
Eosinophils Relative: 0 %
Immature Granulocytes: 1 %
Lymphocytes Relative: 25 %
Lymphs Abs: 1.4 10*3/uL (ref 0.7–4.0)
Monocytes Absolute: 0.5 10*3/uL (ref 0.1–1.0)
Monocytes Relative: 9 %
Neutro Abs: 3.6 10*3/uL (ref 1.7–7.7)
Neutrophils Relative %: 65 %

## 2018-07-18 LAB — PROCALCITONIN: Procalcitonin: <0.1 ng/mL

## 2018-07-18 LAB — I-STAT CHEM 8, ED
BUN: 27 mg/dL — ABNORMAL HIGH (ref 8–23)
Calcium, Ion: 1.07 mmol/L — ABNORMAL LOW (ref 1.15–1.40)
Chloride: 110 mmol/L (ref 98–111)
Creatinine, Ser: 1.8 mg/dL — ABNORMAL HIGH (ref 0.61–1.24)
Glucose, Bld: 97 mg/dL (ref 70–99)
HCT: 37 % — ABNORMAL LOW (ref 39.0–52.0)
Hemoglobin: 12.6 g/dL — ABNORMAL LOW (ref 13.0–17.0)
Potassium: 3.5 mmol/L (ref 3.5–5.1)
Sodium: 140 mmol/L (ref 135–145)
TCO2: 19 mmol/L — ABNORMAL LOW (ref 22–32)

## 2018-07-18 LAB — CBG MONITORING, ED: Glucose-Capillary: 89 mg/dL (ref 70–99)

## 2018-07-18 LAB — FIBRINOGEN: Fibrinogen: 527 mg/dL — ABNORMAL HIGH (ref 210–475)

## 2018-07-18 LAB — FERRITIN: Ferritin: 56 ng/mL (ref 24–336)

## 2018-07-18 LAB — ABO/RH: ABO/RH(D): A POS

## 2018-07-18 LAB — PROTIME-INR
INR: 1.1 (ref 0.8–1.2)
Prothrombin Time: 14.1 seconds (ref 11.4–15.2)

## 2018-07-18 LAB — TROPONIN I (HIGH SENSITIVITY)
Troponin I (High Sensitivity): 30 ng/L — ABNORMAL HIGH (ref <18)
Troponin I (High Sensitivity): 26 ng/L — ABNORMAL HIGH (ref <18)

## 2018-07-18 LAB — SEDIMENTATION RATE: Sed Rate: 9 mm/hr (ref 0–16)

## 2018-07-18 LAB — LACTATE DEHYDROGENASE: LDH: 201 U/L — ABNORMAL HIGH (ref 98–192)

## 2018-07-18 LAB — SARS CORONAVIRUS 2 BY RT PCR (HOSPITAL ORDER, PERFORMED IN ~~LOC~~ HOSPITAL LAB): SARS Coronavirus 2: POSITIVE — AB

## 2018-07-18 LAB — CK: Total CK: 170 U/L (ref 49–397)

## 2018-07-18 LAB — MAGNESIUM: Magnesium: 1.8 mg/dL (ref 1.7–2.4)

## 2018-07-18 LAB — C-REACTIVE PROTEIN: CRP: 7.7 mg/dL — ABNORMAL HIGH (ref <1.0)

## 2018-07-18 LAB — D-DIMER, QUANTITATIVE: D-Dimer, Quant: 0.56 ug/mL-FEU — ABNORMAL HIGH (ref 0.00–0.50)

## 2018-07-18 LAB — BRAIN NATRIURETIC PEPTIDE: B Natriuretic Peptide: 298.7 pg/mL — ABNORMAL HIGH (ref 0.0–100.0)

## 2018-07-18 LAB — APTT: aPTT: 33 seconds (ref 24–36)

## 2018-07-18 MED ORDER — ACETAMINOPHEN 650 MG RE SUPP: 650.0000 mg | Freq: Four times a day (QID) | RECTAL | Status: DC | PRN

## 2018-07-18 MED ORDER — HEPARIN SODIUM (PORCINE) 5000 UNIT/ML IJ SOLN
5000.0000 [IU] | Freq: Three times a day (TID) | INTRAMUSCULAR | Status: DC
  Administered 2018-07-18 – 2018-07-21 (×8): 5000 [IU] via SUBCUTANEOUS
  Filled 2018-07-18 (×8): qty 1

## 2018-07-18 MED ORDER — SENNOSIDES-DOCUSATE SODIUM 8.6-50 MG PO TABS: 1.0000 | ORAL_TABLET | Freq: Every evening | ORAL | Status: DC | PRN

## 2018-07-18 MED ORDER — CLOPIDOGREL BISULFATE 75 MG PO TABS
75.0000 mg | ORAL_TABLET | Freq: Every day | ORAL | Status: DC
  Administered 2018-07-19 – 2018-07-20 (×2): 75 mg via ORAL
  Filled 2018-07-18 (×2): qty 1

## 2018-07-18 MED ORDER — ACETAMINOPHEN 325 MG PO TABS: 650.0000 mg | ORAL_TABLET | Freq: Four times a day (QID) | ORAL | Status: DC | PRN

## 2018-07-18 MED ORDER — PROMETHAZINE HCL 25 MG PO TABS: 12.5000 mg | ORAL_TABLET | Freq: Four times a day (QID) | ORAL | Status: DC | PRN

## 2018-07-18 MED ORDER — ALBUTEROL SULFATE HFA 108 (90 BASE) MCG/ACT IN AERS
2.0000 | INHALATION_SPRAY | Freq: Once | RESPIRATORY_TRACT | Status: AC
  Administered 2018-07-18: 2 via RESPIRATORY_TRACT
  Filled 2018-07-18: qty 6.7

## 2018-07-18 NOTE — Progress Notes (Signed)
Spoke with resident on call and made RN aware.  Pt unable to lie flat for exam, we tried to elevate him as much as possible.  Pt also claustrophobic and if he returns to MRI will need to be able to lay flat and will need medication to relax him.  Our process in MRI requires [place] to shut down the room for an hour after patient has been in it.  We also shut the scan room down for an hour after pt has been in the room.

## 2018-07-18 NOTE — ED Triage Notes (Signed)
Pt here via EMS with c/o slurred speech and AMS. Family found pt on floor this AM. LSN 2000 07-17-2018. EMS reported hypotension, administered 250 NS. Also unable to answer questions appropriately.  Family at the bedside.

## 2018-07-18 NOTE — Progress Notes (Signed)
IM doctor paged. Pt in MRI and refusing to have test because he is claustrophobic. [phone]

## 2018-07-18 NOTE — ED Provider Notes (Signed)
[facility] EMERGENCY DEPARTMENT Provider Note   CSN: [id number] Arrival date & time: 07/18/18  0957     History   Chief Complaint Chief Complaint  Patient presents with   Altered Mental Status   Fall   Aphasia    HPI Nicholas [name] is a 85 y.o. male.     The history is provided by the patient, a friend, the EMS personnel and medical records.  Fall Pertinent negatives include no chest pain, no abdominal pain, no headaches and no shortness of breath.  Neurologic Problem This is a recurrent problem. Episode onset: last normal 8 pm lastnight. The problem occurs constantly. The problem has been resolved. Pertinent negatives include no chest pain, no abdominal pain, no headaches and no shortness of breath. Nothing aggravates the symptoms. Nothing relieves the symptoms. He has tried nothing for the symptoms. The treatment provided no relief.    Past Medical History:  Diagnosis Date   Anemia    none recent   Arthritis    Asthma    Chronic kidney disease    ckd stage 3   Family history of adverse reaction to anesthesia    daughter has ponv    Hypertension     Patient Active Problem List   Diagnosis Date Noted   Renal insufficiency 11/27/2017   Hyperlipidemia 11/02/2017   TIA (transient ischemic attack) 11/01/2017   CHF (congestive heart failure) ([facility]) 11/01/2017   HTN (hypertension) 11/01/2017    Past Surgical History:  Procedure Laterality Date   COLONOSCOPY WITH PROPOFOL N/A 02/03/2015   Procedure: COLONOSCOPY WITH PROPOFOL;  Surgeon: Arta Silence, MD;  Location: [facility];  Service: Endoscopy;  Laterality: N/A;   KNEE ARTHROSCOPY Left    ROTATOR CUFF REPAIR Right    SHOULDER ARTHROSCOPY Left         Home Medications    Prior to Admission medications   Medication Sig Start Date End Date Taking? Authorizing Provider  acetaminophen (TYLENOL) 500 MG tablet Take 500-1,000 mg by mouth every 8 (eight) hours as needed (for  pain or headaches).     [provider]  albuterol (PROVENTIL HFA;VENTOLIN HFA) 108 (90 Base) MCG/ACT inhaler Inhale 2 puffs into the lungs See admin instructions. Inhale 2 puffs into the lungs in the morning and 2 puffs at bedtime and may be used every 4 hours as needed for shortness of breath or wheezing    [provider]  allopurinol (ZYLOPRIM) 100 MG tablet Take 100 mg by mouth daily.    [provider]  amLODipine (NORVASC) 10 MG tablet Take 5 mg by mouth daily. 10/26/17   [provider]  bisoprolol (ZEBETA) 5 MG tablet Take 2.5 mg by mouth every morning.     [provider]  Calcium Carb-Cholecalciferol (CALCIUM 600+D3 PO) Take 2 tablets by mouth daily.    [provider]  clopidogrel (PLAVIX) 75 MG tablet Take 1 tablet (75 mg total) by mouth daily. 12/10/17   Garvin Fila, MD  Fluticasone-Salmeterol (ADVAIR) 250-50 MCG/DOSE AEPB Inhale 1 puff into the lungs 2 (two) times daily.    [provider]  furosemide (LASIX) 20 MG tablet Take 1 tablet (20 mg total) by mouth daily. 07/16/18   Varney Biles, MD  hydrALAZINE (APRESOLINE) 10 MG tablet Take 1 tablet (10 mg total) by mouth 3 (three) times daily. 11/03/17   Hosie Poisson, MD  latanoprost (XALATAN) 0.005 % ophthalmic solution Place 1 drop into both eyes at bedtime. 07/26/17   [provider]  Multiple Vitamin (MULTIVITAMIN WITH MINERALS) TABS tablet Take 1 tablet by mouth every morning.    [provider]  Omega-3 Fatty Acids (FISH OIL) 1000 MG CAPS Take 1,000 mg by mouth daily.    [provider]  pantoprazole (PROTONIX) 20 MG tablet Take 1 tablet (20 mg total) by mouth daily. Patient not taking: Reported on 07/16/2018 04/01/18   Garvin Fila, MD    Family History No family history on file.  Social History Social History   Tobacco Use   Smoking status: Former Smoker   Smokeless tobacco: Never Used   Tobacco comment: quit 35 yrs ago    Substance Use Topics   Alcohol use: No   Drug use: No     Allergies   Penicillins   Review of Systems Review of Systems  Constitutional: Negative for chills, diaphoresis, fatigue and fever.  HENT: Negative for congestion.   Eyes: Negative for photophobia and visual disturbance.  Respiratory: Negative for cough, chest tightness, shortness of breath and wheezing.   Cardiovascular: Negative for chest pain.  Gastrointestinal: Negative for abdominal pain, diarrhea, nausea and vomiting.  Genitourinary: Negative for flank pain.  Musculoskeletal: Negative for back pain, neck pain and neck stiffness.  Neurological: Positive for speech difficulty. Negative for dizziness, weakness, light-headedness, numbness and headaches.  Psychiatric/Behavioral: Positive for confusion (with EMS'). Negative for agitation and hallucinations. The patient is not nervous/anxious.   All other systems reviewed and are negative.    Physical Exam Updated Vital Signs BP 113/75    Pulse 92    Temp 97.6 F (36.4 C) (Oral)    Resp (!) 23    Ht 6' (1.829 m)    Wt 90 kg    SpO2 95%    BMI 26.91 kg/m   Physical Exam Vitals signs and nursing note reviewed.  Constitutional:      General: He is not in acute distress.    Appearance: He is well-developed. He is not ill-appearing, toxic-appearing or diaphoretic.  HENT:     Head: Normocephalic and atraumatic.     Nose: No congestion or rhinorrhea.     Mouth/Throat:     Mouth: Mucous membranes are moist.     Pharynx: No oropharyngeal exudate or posterior oropharyngeal erythema.  Eyes:     Conjunctiva/sclera: Conjunctivae normal.  Neck:     Musculoskeletal: Neck supple. No muscular tenderness.  Cardiovascular:     Rate and Rhythm: Normal rate. Rhythm irregular.     Pulses: Normal pulses.     Heart sounds: No murmur.  Pulmonary:     Effort: Pulmonary effort is normal. No respiratory distress.     Breath sounds: Rales present. No wheezing or rhonchi.  Chest:      Chest wall: No tenderness.  Abdominal:     General: Abdomen is flat. There is no distension.     Palpations: Abdomen is soft.     Tenderness: There is no abdominal tenderness.  Musculoskeletal:        General: Tenderness present.     Right lower leg: No edema.     Left lower leg: No edema.  Skin:    General: Skin is warm and dry.     Capillary Refill: Capillary refill takes less than 2 seconds.     Findings: No erythema or rash.  Neurological:     General: No focal deficit present.     Mental Status: He is alert.     Cranial Nerves: No cranial nerve deficit.  Sensory: No sensory deficit.     Motor: No weakness.     Coordination: Coordination normal.  Psychiatric:        Mood and Affect: Mood normal.      ED Treatments / Results  Labs (all labs ordered are listed, but only abnormal results are displayed) Labs Reviewed  SARS CORONAVIRUS 2 (HOSPITAL ORDER, [place] [facility]) - Abnormal; Notable for the following components:      Result Value   SARS Coronavirus 2 POSITIVE (*)    All other components within normal limits  CBC - Abnormal; Notable for the following components:   Hemoglobin 12.6 (*)    All other components within normal limits  COMPREHENSIVE METABOLIC PANEL - Abnormal; Notable for the following components:   CO2 19 (*)    BUN 29 (*)    Creatinine, Ser 1.94 (*)    Calcium 8.1 (*)    Total Protein 5.6 (*)    Albumin 2.9 (*)    GFR calc non Af Amer 31 (*)    GFR calc Af Amer 36 (*)    All other components within normal limits  URINALYSIS, ROUTINE W REFLEX MICROSCOPIC - Abnormal; Notable for the following components:   Protein, ur 100 (*)    All other components within normal limits  TROPONIN I (HIGH SENSITIVITY) - Abnormal; Notable for the following components:   Troponin I (High Sensitivity) 30 (*)    All other components within normal limits  TROPONIN I (HIGH SENSITIVITY) - Abnormal; Notable for the following components:    Troponin I (High Sensitivity) 26 (*)    All other components within normal limits  BRAIN NATRIURETIC PEPTIDE - Abnormal; Notable for the following components:   B Natriuretic Peptide 298.7 (*)    All other components within normal limits  I-STAT CHEM 8, ED - Abnormal; Notable for the following components:   BUN 27 (*)    Creatinine, Ser 1.80 (*)    Calcium, Ion 1.07 (*)    TCO2 19 (*)    Hemoglobin 12.6 (*)    HCT 37.0 (*)    All other components within normal limits  URINE CULTURE  PROTIME-INR  APTT  DIFFERENTIAL  MAGNESIUM  CK  CBG MONITORING, ED    EKG EKG Interpretation  Date/Time:  Thursday July 18 2018 10:08:19 EDT Ventricular Rate:  107 PR Interval:    QRS Duration: 147 QT Interval:  378 QTC Calculation: 505 R Axis:   -73 Text Interpretation:  Pacemaker spikes or artifacts Atrial fibrillation RBBB and LAFB When compared to prior, now appears to show afib.  No STEMI Confirmed by Antony Blackbird [phone]) on 07/18/2018 10:14:29 AM   Radiology Dg Elbow Complete Right  Result Date: 07/18/2018 CLINICAL DATA:  Right elbow pain after a fall today. Laceration. EXAM: RIGHT ELBOW - COMPLETE 3+ VIEW COMPARISON:  None. FINDINGS: There is no fracture or dislocation. Slight calcific tendinopathy in the origins of the common extensor and common flexor tendons from the distal humeral condyles. No joint effusion IMPRESSION: No acute abnormality. Electronically Signed   By: Lorriane Shire M.D.   On: 07/18/2018 11:15   Ct Head Wo Contrast  Result Date: 07/18/2018 CLINICAL DATA:  Slurred speech EXAM: CT HEAD WITHOUT CONTRAST TECHNIQUE: Contiguous axial images were obtained from the base of the skull through the vertex without intravenous contrast. COMPARISON:  10/25/2017, 07/15/2018 FINDINGS: Brain: No evidence of acute infarction, hemorrhage, extra-axial collection, ventriculomegaly, or mass effect. Mega cisterna magna again noted. Generalized cerebral  atrophy. Periventricular white matter low  attenuation likely secondary to microangiopathy. Vascular: Cerebrovascular atherosclerotic calcifications are noted. Skull: Negative for fracture or focal lesion. Sinuses/Orbits: Visualized portions of the orbits are unremarkable. Mastoid sinuses are clear. Mucosal thickening of bilateral maxillary sinuses, ethmoid sinuses and left sphenoid sinus. Other: None. IMPRESSION: 1. No acute intracranial pathology. Electronically Signed   By: Kathreen Devoid   On: 07/18/2018 11:42   Dg Hip Unilat W Or Wo Pelvis 2-3 Views Left  Result Date: 07/18/2018 CLINICAL DATA:  Left hip pain secondary to a fall today. EXAM: DG HIP (WITH OR WITHOUT PELVIS) 2-3V LEFT COMPARISON:  None. FINDINGS: There is no fracture or dislocation. The sacroiliac joints appear to be fused. Visualized bowel gas pattern is normal. IMPRESSION: No acute abnormality. Electronically Signed   By: Lorriane Shire M.D.   On: 07/18/2018 11:13    Procedures Procedures (including critical care time)  Medications Ordered in ED Medications  heparin injection 5,000 Units (has no administration in time range)  acetaminophen (TYLENOL) tablet 650 mg (has no administration in time range)    Or  acetaminophen (TYLENOL) suppository 650 mg (has no administration in time range)  senna-docusate (Senokot-S) tablet 1 tablet (has no administration in time range)  promethazine (PHENERGAN) tablet 12.5 mg (has no administration in time range)  albuterol (VENTOLIN HFA) 108 (90 Base) MCG/ACT inhaler 2 puff (2 puffs Inhalation Given 07/18/18 1359)     Initial Impression / Assessment and Plan / ED Course  I have reviewed the triage vital signs and the nursing notes.  Pertinent labs & imaging results that were available during my care of the patient were reviewed by me and considered in my medical decision making (see chart for details).        Nicholas [name] is a 85 y.o. male with a past medical history significant for prior TIAs, CHF, hyperte nsion,  hyperlipidemia, renal insufficiency who presents for neurologic deficit.  According to EMS and family, patient was last normal at 8 PM last night.  Patient was found down this morning around 8:30 AM by family.  Patient was not speaking clearly with both slurred speech and improper speech.  When asked how patient was feeling by EMS, he said purple.  Patient was not answering questions appropriately.  Patient complained EMS of left hip pain but otherwise was denying headache or neck pain.  Initially in route, patient had a code stroke however his speech difficulty improved and nearly resolved by arrival to the emergency department.  Patient arrived saying his speech feels fine.  Family was able to give further history stating that he recently had a work-up for possible TIA several days ago.  On exam, lungs have crackles in the bases and chest is nontender.  Abdomen is nontender.  Patient has left hip tenderness but has normal sensation and strength in feet.  Patient has normal grip strength and sensation in upper extremities as well as upper extremity pulses.  Patient had normal finger-nose-finger testing bilaterally.  Patient's pupils were symmetric and reactive with normal extraocular movements.  Visual fields were intact.  Speech was clear on my exam.  No neck tenderness or evidence of trauma seen.  Patient with x-ray of the left hip given the pain.  He will have a CK and labs for being down.  For the transient speech difficulty, will presume TIA but will get head CT and screening labs to look for other causes.  EKG does not show STEMI but does appear to  show possible atrial fibrillation.  This may be the etiology of patient's TIA symptoms.  Anticipate speaking with neurology after work-up.  Code stroke was canceled by emergency team and neurology upon arrival to the ED.  Coronavirus test will be collected as patient may require admission.  Neurology saw the patient and feel he may have had a new TIA.   They recommended MRI, MRA, and admission to medicine service.  Of note, patient's BNP was rising from prior and his lungs did sound wet as if he has a complaint of fluid overload.  Patient will need his troponin trended.  Patient was found to have coronavirus.  Patient's elbow has a small abrasion/skin tear.  No deep laceration seen.  X-ray shows no underlying fracture.  Patient will be admitted to internal medicine teaching service for further management of his symptoms.  Wound will be bandaged and managed for the skin tear.  Will defer to internal medicine for further management of the A. fib and anticoagulation if needed.  Patient admitted for further management of TIA, fluid overload, and coronavirus.    Final Clinical Impressions(s) / ED Diagnoses   Final diagnoses:  COVID-19  Transient speech disturbance  TIA (transient ischemic attack)  Atrial fibrillation, unspecified type [facility])    ED Discharge Orders    None     Clinical Impression: 1. COVID-19   2. Transient speech disturbance   3. TIA (transient ischemic attack)   4. Atrial fibrillation, unspecified type [facility])     Disposition: Admit  This note was prepared with assistance of Dragon voice recognition software. Occasional wrong-word or sound-a-like substitutions may have occurred due to the inherent limitations of voice recognition software.     Gwynn Chalker, Gwenyth Allegra, MD 07/18/18 [phone]

## 2018-07-18 NOTE — Progress Notes (Signed)
Pt returned to rm per bed with staff. Pt refused MRI r/t claustrophobia and MD that was contacted for prn med to help relax pt never ordered anything so MRI wasn't done.

## 2018-07-18 NOTE — H&P (Addendum)
Date: 07/18/2018               Patient Name:  Nicholas [name] MRN: [id number]  DOB: 1933-04-07 Age / Sex: 85 y.o., male   PCP: Street, [name], MD         Medical Service: Internal Medicine Teaching Service         Attending Physician: Dr. Lenice Pressman    First Contact: Dr. Trilby Drummer Pager: [phone]  Second Contact: Dr. Rebeca Alert Pager: [phone]       After Hours (After 5p/  First Contact Pager: [phone]  weekends / holidays): Second Contact Pager: [phone]   Chief Complaint: Slurred speech, AMS  History of Present Illness:  [name] is an 85 y.o male with hypertension, asthma, CKD stage III, history of TIAs, hard of hearing, glaucoma, and HLD who presented with slurred speech and AMS after a fall.   Called patient's grandson [name] who stated that his wife had heard a thud around 630 this morning and when she went to check on the patient noted that he had fallen beside his bed.  Patient was attempting to stand up, but they told him to not move.  He was having a hard time speaking at that time.  EMS was called by patient's daughter.   EMS noted that the patient was not speaking clearly (had slurred and improper speech) and was answering questions inappropriately.   Of note, the patient has been admitted three times over the past 14 months for TIA-like episodes where he would lose balance and be confused.  Patient last saw neurology December 2019 during which time he started on Plavix 75 mg daily for secondary stroke prevention, told to maintain tight blood pressure control, check A1c and lipid panel.  He was told to follow-up with neurology in 3 months.  More recently, the patient was trouble with balance on Sunday 7/5.  The patient was evaluated at [facility] urgent care on Monday 7/6 and sent to [place].  CT head followed by MRI brain were done which did not show any acute intracranial abnormalities and he was back to baseline, therefore he was sent home.  At baseline,  the patient mows the law a few times weekly, able to dress himself and shower, walks his dog daily, and drives.   Patient was brought in as a code stroke to ED. In ED, patient was afebrile, tachycardic in the low 100s, tachypneic in 20-30s, saturating in the low 90s on room air.   Meds:  Current Meds  Medication Sig   acetaminophen (TYLENOL) 500 MG tablet Take 500-1,000 mg by mouth every 8 (eight) hours as needed (for pain or headaches).    albuterol (PROVENTIL HFA;VENTOLIN HFA) 108 (90 Base) MCG/ACT inhaler Inhale 2 puffs into the lungs 2 (two) times a day.    allopurinol (ZYLOPRIM) 100 MG tablet Take 100 mg by mouth daily.   amLODipine (NORVASC) 10 MG tablet Take 5 mg by mouth daily.   bisoprolol (ZEBETA) 5 MG tablet Take 2.5 mg by mouth every morning.    Calcium Carb-Cholecalciferol (CALCIUM 600+D3 PO) Take 2 tablets by mouth daily.   clopidogrel (PLAVIX) 75 MG tablet Take 1 tablet (75 mg total) by mouth daily.   Fluticasone-Salmeterol (ADVAIR) 250-50 MCG/DOSE AEPB Inhale 1 puff into the lungs 2 (two) times daily.   furosemide (LASIX) 20 MG tablet Take 1 tablet (20 mg total) by mouth daily.   hydrALAZINE (APRESOLINE) 10 MG tablet Take 1 tablet (10  mg total) by mouth 3 (three) times daily.   latanoprost (XALATAN) 0.005 % ophthalmic solution Place 1 drop into both eyes at bedtime.   Multiple Vitamin (MULTIVITAMIN WITH MINERALS) TABS tablet Take 1 tablet by mouth every morning.   Omega-3 Fatty Acids (FISH OIL) 1000 MG CAPS Take 1,000 mg by mouth daily.   pantoprazole (PROTONIX) 20 MG tablet Take 1 tablet (20 mg total) by mouth daily.     Allergies: Allergies as of 07/18/2018 - Review Complete 07/18/2018  Allergen Reaction Noted   Penicillins Hives 01/28/2015   Past Medical History:  Diagnosis Date   Anemia    none recent   Arthritis    Asthma    Chronic kidney disease    ckd stage 3   Family history of adverse reaction to anesthesia    daughter has ponv     Glaucoma    Hypertension     Family History:   Heart disease-parent HTN-parents, siblings, children DM-2 sons  Chief Strategy Officer   Social History:  Lives in [place] with grandson [name] LOVFI-[phone]) and grandson's wife  POA is daughter Durenda Age and Eber Ferrufino 3 sons, 3 daughters Retired, previously a Administrator  Quit EtOH 16SAY ago  Quit smoking 35 yrs ago, used to smoke <1 pack per week for 20-55yr  No drug use   Review of Systems: A complete ROS was negative except as per HPI.  Physical Exam: Blood pressure 127/80, pulse (!) 101, temperature 97.6 F (36.4 C), temperature source Oral, resp. rate (!) 21, height 6' (1.829 m), weight 90 kg, SpO2 92 %.  General: lying in stretcher, comfortable HEENT: normal sclera, moist mucous membranes Cardiac: RRR, tachycardic to 110, brisk pulses and warm extremities Pulmonary: mildly tachypneic to low 20s, normal respiratory effort Abdomen: soft, non-tender, no masses Extremities: No synovitis or joint effusions Skin: laceration on right elbow with small area of avulsed skin, small laceration and bruise on right knee Neuro: Alert, oriented x3, normal speech, PERRL, EOMI, smile symmetric, facial sensation intact and equal, tongue protrudes midline, shoulder shrug symmetric. Strength 5/5 throughout, sensation equal and intact to light touch.  Labs: CBC wbc 5.5, hb 12.6, hct 39.3, mcv 86.2, plts 173 CMP na 141, k 3.6, cr 1.94, alb 2.9, ca 8.1 Mg 1.8 Trop 30>26 CK 170 BNP 298 COVID19 positive  EKG: personally reviewed my interpretation is tachycardic, appears to be Afib, no clear P waves, but some artifact, also RBBB  CXR: personally reviewed my interpretation is mild bilateral hazy opacities, small rounded opacity at right lung base  Assessment & Plan by Problem:  Mr. PTrickettis an 85y.o male with HTN, asthma, CKD stage III, and TIA who presented with aphasia and confusion after a fall this morning, now seemingly  back to baseline. Also SARS-CoV2 positive  TIA  High concern for TIA with fall possibly representing weakness or imbalance and aphasia, but now seemingly back to baseline. CT head negative. Arrived at code stroke, Neurology following, appreciate consultation.   -Continue Plavix 75 mg daily -Start atorvastatin 80 mg daily -Allow permissive hypertension up to 220/120 mmhg -PT/OT/SLP eval -Pending MRI Brain and MRA head/neck -Hemoglobin A1c pending -Lipid panel pending -TTE -Continuous cardiac monitoring  New Afib Appears in Afib on initial EKG, though difficult to interpret  -Repeat EKG in am -Will consider anticoagulation  COVID-19 Asymptomatic. Patient has not had exposure to anyone with known [place]obinette He has mainly stayed in his home with his two grandson's and grandaughter-in-law. He went to [employer] one  week ago.   -CXR with mild bilateral hazy opacities and small round opacity in R lung base -Monitor spo2 -COVID-19 labs: ESR, procalcitonin, LDH, fibrinogen, ferritin, d-dimer, CRP, ABO -monitor fever curve   Hypertension Patient is an admission has ranged 100-120/80s.  Patient takes bisoprolol 2.5 mg daily, amlodipine 10 mg  -Holding home blood pressure medications to allow for permissive hypertension  CKD3 Creatinine 1.94, baseline ranging 1.7-2.3.   -Will continue to monitor   Dispo: Admit patient to observation for evaluation of possible TIA and management of COVID-19.  SignedLars Mage, MD 07/18/2018, 3:01 PM  Pager: [phone]   Internal Medicine Attending Attestation:   I have seen and evaluated this patient and I have discussed the plan of care with the house staff. Please see their note for complete details, which I have updated with my personal examination, assessment, and plan.  Oda Kilts, MD 07/18/2018, 8:22 PM

## 2018-07-18 NOTE — ED Notes (Signed)
ED TO INPATIENT HANDOFF REPORT  ED Nurse Name and Phone #: Sherita Decoste [phone]  S Name/Age/Gender Nicholas [name] 85 y.o. male Room/Bed: 034C/034C  Code Status   Code Status: Full Code  Home/SNF/Other Home Patient oriented to: self Is this baseline? Yes   Triage Complete: Triage complete  Chief Complaint Dizziness  Triage Note Pt here via EMS with c/o slurred speech and AMS. Family found pt on floor this AM. LSN 2000 07-17-2018. EMS reported hypotension, administered 250 NS. Also unable to answer questions appropriately.  Family at the bedside.    Allergies Allergies  Allergen Reactions  . Penicillins Hives    Childhood allergy Has patient had a PCN reaction causing immediate rash, facial/tongue/throat swelling, SOB or lightheadedness with hypotension: Yes Has patient had a PCN reaction causing severe rash involving mucus membranes or skin necrosis: Yes Has patient had a PCN reaction that required hospitalization: Yes Has patient had a PCN reaction occurring within the last 10 years: No If all of the above answers are "NO", then may proceed with Cephalosporin use.     Level of Care/Admitting Diagnosis ED Disposition    ED Disposition Condition Comment   Admit  Hospital Area: [facility] [[id number]]  Level of Care: Telemetry Medical [104]  Covid Evaluation: Confirmed COVID Positive  Diagnosis: TIA (transient ischemic attack) [353299]  Admitting Physician: Oda Kilts [[id number]]  Attending Physician: Oda Kilts [[id number]]  Estimated length of stay: past midnight tomorrow  Certification:: I certify this patient will need inpatient services for at least 2 midnights  PT Class (Do Not Modify): Inpatient [101]  PT Acc Code (Do Not Modify): Private [1]       B Medical/Surgery History Past Medical History:  Diagnosis Date  . Anemia    none recent  . Arthritis   . Asthma   . Chronic kidney disease    ckd stage 3  . Family history of  adverse reaction to anesthesia    daughter has ponv   . Glaucoma   . Hypertension    Past Surgical History:  Procedure Laterality Date  . COLONOSCOPY WITH PROPOFOL N/A 02/03/2015   Procedure: COLONOSCOPY WITH PROPOFOL;  Surgeon: Arta Silence, MD;  Location: [facility];  Service: Endoscopy;  Laterality: N/A;  . KNEE ARTHROSCOPY Left   . ROTATOR CUFF REPAIR Right   . SHOULDER ARTHROSCOPY Left      A IV Location/Drains/Wounds Patient Lines/Drains/Airways Status   Active Line/Drains/Airways    Name:   Placement date:   Placement time:   Site:   Days:   Peripheral IV 07/18/18 Anterior;Left;Proximal Forearm   07/18/18    1011    Forearm   less than 1   Airway   02/03/15    0923     1261          Intake/Output Last 24 hours No intake or output data in the 24 hours ending 07/18/18 1638  Labs/Imaging Results for orders placed or performed during the hospital encounter of 07/18/18 (from the past 48 hour(s))  Protime-INR     Status: None   Collection Time: 07/18/18 10:02 AM  Result Value Ref Range   Prothrombin Time 14.1 11.4 - 15.2 seconds   INR 1.1 0.8 - 1.2    Comment: (NOTE) INR goal varies based on device and disease states. Performed at [facility], [place] [address] [place], [place] [address]   APTT     Status: None   Collection Time: 07/18/18 10:02 AM  Result Value Ref Range   aPTT 33 24 - 36 seconds    Comment: Performed at [facility] [address] [place], [place] [address]  CBC     Status: Abnormal   Collection Time: 07/18/18 10:02 AM  Result Value Ref Range   WBC 5.5 4.0 - 10.5 K/uL   RBC 4.56 4.22 - 5.81 MIL/uL   Hemoglobin 12.6 (L) 13.0 - 17.0 g/dL   HCT 39.3 39.0 - 52.0 %   MCV 86.2 80.0 - 100.0 fL   MCH 27.6 26.0 - 34.0 pg   MCHC 32.1 30.0 - 36.0 g/dL   RDW 14.0 11.5 - 15.5 %   Platelets 173 150 - 400 K/uL   nRBC 0.0 0.0 - 0.2 %    Comment: Performed at [facility], [place] [address], [place], [place] [address]  Differential      Status: None   Collection Time: 07/18/18 10:02 AM  Result Value Ref Range   Neutrophils Relative % 65 %   Neutro Abs 3.6 1.7 - 7.7 K/uL   Lymphocytes Relative 25 %   Lymphs Abs 1.4 0.7 - 4.0 K/uL   Monocytes Relative 9 %   Monocytes Absolute 0.5 0.1 - 1.0 K/uL   Eosinophils Relative 0 %   Eosinophils Absolute 0.0 0.0 - 0.5 K/uL   Basophils Relative 0 %   Basophils Absolute 0.0 0.0 - 0.1 K/uL   Immature Granulocytes 1 %   Abs Immature Granulocytes 0.03 0.00 - 0.07 K/uL    Comment: Performed at [facility], [place] [address] [place], [place] [address]  Comprehensive metabolic panel     Status: Abnormal   Collection Time: 07/18/18 10:02 AM  Result Value Ref Range   Sodium 141 135 - 145 mmol/L   Potassium 3.6 3.5 - 5.1 mmol/L   Chloride 111 98 - 111 mmol/L   CO2 19 (L) 22 - 32 mmol/L   Glucose, Bld 97 70 - 99 mg/dL   BUN 29 (H) 8 - 23 mg/dL   Creatinine, Ser 1.94 (H) 0.61 - 1.24 mg/dL   Calcium 8.1 (L) 8.9 - 10.3 mg/dL   Total Protein 5.6 (L) 6.5 - 8.1 g/dL   Albumin 2.9 (L) 3.5 - 5.0 g/dL   AST 31 15 - 41 U/L   ALT 22 0 - 44 U/L   Alkaline Phosphatase 72 38 - 126 U/L   Total Bilirubin 0.6 0.3 - 1.2 mg/dL   GFR calc non Af Amer 31 (L) >60 mL/min   GFR calc Af Amer 36 (L) >60 mL/min   Anion gap 11 5 - 15    Comment: Performed at [facility], [place] [address], [place], [place] [address]  I-stat chem 8, ED     Status: Abnormal   Collection Time: 07/18/18 10:05 AM  Result Value Ref Range   Sodium 140 135 - 145 mmol/L   Potassium 3.5 3.5 - 5.1 mmol/L   Chloride 110 98 - 111 mmol/L   BUN 27 (H) 8 - 23 mg/dL   Creatinine, Ser 1.80 (H) 0.61 - 1.24 mg/dL   Glucose, Bld 97 70 - 99 mg/dL   Calcium, Ion 1.07 (L) 1.15 - 1.40 mmol/L   TCO2 19 (L) 22 - 32 mmol/L   Hemoglobin 12.6 (L) 13.0 - 17.0 g/dL   HCT 37.0 (L) 39.0 - 52.0 %  CBG monitoring, ED     Status: None   Collection Time: 07/18/18 10:05 AM  Result Value  Ref Range   Glucose-Capillary 89 70 - 99 mg/dL  Magnesium      Status: None   Collection Time: 07/18/18 10:12 AM  Result Value Ref Range   Magnesium 1.8 1.7 - 2.4 mg/dL    Comment: Performed at [facility] [address], [place], [place] [address]  Troponin I (High Sensitivity)     Status: Abnormal   Collection Time: 07/18/18 10:12 AM  Result Value Ref Range   Troponin I (High Sensitivity) 30 (H) <18 ng/L    Comment: (NOTE) Elevated high sensitivity troponin I (hsTnI) values and significant  changes across serial measurements may suggest ACS but many other  chronic and acute conditions are known to elevate hsTnI results.  Refer to the "Links" section for chest pain algorithms and additional  guidance. Performed at [facility], [place] [address], [place], [place] [address]   CK     Status: None   Collection Time: 07/18/18 10:12 AM  Result Value Ref Range   Total CK 170 49 - 397 U/L    Comment: Performed at [facility], [place] [address], [place], [place] [address]  Brain natriuretic peptide     Status: Abnormal   Collection Time: 07/18/18 10:13 AM  Result Value Ref Range   B Natriuretic Peptide 298.7 (H) 0.0 - 100.0 pg/mL    Comment: Performed at [facility] [address], [place], [place] [address]  SARS Coronavirus 2 (CEPHEID - Performed in [facility] hospital lab), Hosp Order     Status: Abnormal   Collection Time: 07/18/18 10:45 AM   Specimen: Nasopharyngeal Swab  Result Value Ref Range   SARS Coronavirus 2 POSITIVE (A) NEGATIVE    Comment: RESULT CALLED TO, READ BACK BY AND VERIFIED WITH: Rhona Leavens RN 12:00 07/18/18 ([email or web address]) (NOTE) If result is NEGATIVE SARS-CoV-2 target nucleic acids are NOT DETECTED. The SARS-CoV-2 RNA is generally detectable in upper and lower  respiratory specimens during the acute phase of infection. The lowest  concentration of SARS-CoV-2 viral copies this assay can detect is 250  copies / mL. A negative result does not preclude SARS-CoV-2 infection  and should not be used as the  sole basis for treatment or other  patient management decisions.  A negative result may occur with  improper specimen collection / handling, submission of specimen other  than nasopharyngeal swab, presence of viral mutation(s) within the  areas targeted by this assay, and inadequate number of viral copies  (<250 copies / mL). A negative result must be combined with clinical  observations, patient history, and epidemiological information. If result is POSITIVE SARS-CoV-2 target nucleic acids are DETECTED. The  SARS-CoV-2 RNA is generally detectable in upper and lower  respiratory specimens during the acute phase of infection.  Positive  results are indicative of active infection with SARS-CoV-2.  Clinical  correlation with patient history and other diagnostic information is  necessary to determine patient infection status.  Positive results do  not rule out bacterial infection or co-infection with other viruses. If result is PRESUMPTIVE POSTIVE SARS-CoV-2 nucleic acids MAY BE PRESENT.   A presumptive positive result was obtained on the submitted specimen  and confirmed on repeat testing.  While 2019 novel coronavirus  (SARS-CoV-2) nucleic acids may be present in the submitted sample  additional confirmatory testing may be necessary for epidemiological  and / or clinical management purposes  to differentiate between  SARS-CoV-2 and other Sarbecovirus currently known to infect humans.  If  clinically indicated additional testing with an alternate test  methodology [phone]) is a dvised. The SARS-CoV-2 RNA is generally  detectable in upper and lower respiratory specimens during the acute  phase of infection. The expected result is Negative. Fact Sheet for Patients:  StrictlyIdeas.no Fact Sheet for Healthcare Providers: BankingDealers.co.za This test is not yet approved or cleared by the [place] FDA and has been authorized for detection  and/or diagnosis of SARS-CoV-2 by FDA under an Emergency Use Authorization (EUA).  This EUA will remain in effect (meaning this test can be used) for the duration of the COVID-19 declaration under Section 564(b)(1) of the Act, 21 U.S.C. section 360bbb-3(b)(1), unless the authorization is terminated or revoked sooner. Performed at [facility], [place] [address], [place], [place] [address]   Urinalysis, Routine w reflex microscopic     Status: Abnormal   Collection Time: 07/18/18 11:44 AM  Result Value Ref Range   Color, Urine YELLOW YELLOW   APPearance CLEAR CLEAR   Specific Gravity, Urine 1.019 1.005 - 1.030   pH 5.0 5.0 - 8.0   Glucose, UA NEGATIVE NEGATIVE mg/dL   Hgb urine dipstick NEGATIVE NEGATIVE   Bilirubin Urine NEGATIVE NEGATIVE   Ketones, ur NEGATIVE NEGATIVE mg/dL   Protein, ur 100 (A) NEGATIVE mg/dL   Nitrite NEGATIVE NEGATIVE   Leukocytes,Ua NEGATIVE NEGATIVE   RBC / HPF 0-5 0 - 5 RBC/hpf   WBC, UA 0-5 0 - 5 WBC/hpf   Bacteria, UA NONE SEEN NONE SEEN   Squamous Epithelial / LPF 0-5 0 - 5    Comment: Performed at [facility], [place] [address], [place], [place] [address]  Troponin I (High Sensitivity)     Status: Abnormal   Collection Time: 07/18/18 12:13 PM  Result Value Ref Range   Troponin I (High Sensitivity) 26 (H) <18 ng/L    Comment: (NOTE) Elevated high sensitivity troponin I (hsTnI) values and significant  changes across serial measurements may suggest ACS but many other  chronic and acute conditions are known to elevate hsTnI results.  Refer to the "Links" section for chest pain algorithms and additional  guidance. Performed at [facility], [place] [address] [place], [place] [address]    Dg Elbow Complete Right  Result Date: 07/18/2018 CLINICAL DATA:  Right elbow pain after a fall today. Laceration. EXAM: RIGHT ELBOW - COMPLETE 3+ VIEW COMPARISON:  None. FINDINGS: There is no fracture or dislocation. Slight calcific tendinopathy in the  origins of the common extensor and common flexor tendons from the distal humeral condyles. No joint effusion IMPRESSION: No acute abnormality. Electronically Signed   By: Lorriane Shire M.D.   On: 07/18/2018 11:15   Ct Head Wo Contrast  Result Date: 07/18/2018 CLINICAL DATA:  Slurred speech EXAM: CT HEAD WITHOUT CONTRAST TECHNIQUE: Contiguous axial images were obtained from the base of the skull through the vertex without intravenous contrast. COMPARISON:  10/25/2017, 07/15/2018 FINDINGS: Brain: No evidence of acute infarction, hemorrhage, extra-axial collection, ventriculomegaly, or mass effect. Mega cisterna magna again noted. Generalized cerebral atrophy. Periventricular white matter low attenuation likely secondary to microangiopathy. Vascular: Cerebrovascular atherosclerotic calcifications are noted. Skull: Negative for fracture or focal lesion. Sinuses/Orbits: Visualized portions of the orbits are unremarkable. Mastoid sinuses are clear. Mucosal thickening of bilateral maxillary sinuses, ethmoid sinuses and left sphenoid sinus. Other: None. IMPRESSION: 1. No acute intracranial pathology. Electronically Signed   By: Kathreen Devoid   On: 07/18/2018 11:42   Dg Hip Unilat W Or Wo  Pelvis 2-3 Views Left  Result Date: 07/18/2018 CLINICAL DATA:  Left hip pain secondary to a fall today. EXAM: DG HIP (WITH OR WITHOUT PELVIS) 2-3V LEFT COMPARISON:  None. FINDINGS: There is no fracture or dislocation. The sacroiliac joints appear to be fused. Visualized bowel gas pattern is normal. IMPRESSION: No acute abnormality. Electronically Signed   By: Lorriane Shire M.D.   On: 07/18/2018 11:13    Pending Labs Unresulted Labs (From admission, onward)    Start     Ordered   07/19/18 0500  Hemoglobin A1c  Tomorrow morning,   R     07/18/18 1626   07/19/18 0500  Comprehensive metabolic panel  Tomorrow morning,   R     07/18/18 1626   07/19/18 0500  CBC  Tomorrow morning,   R     07/18/18 1626   07/18/18 1632  ABO/Rh   Once,   STAT     07/18/18 1632   07/18/18 1632  C-reactive protein  Once,   STAT     07/18/18 1632   07/18/18 1632  D-dimer, quantitative (not at [facility])  Once,   STAT     07/18/18 1632   07/18/18 1632  Ferritin  Once,   STAT     07/18/18 1632   07/18/18 1632  Fibrinogen  Once,   STAT     07/18/18 1632   07/18/18 1632  Lactate dehydrogenase  Once,   STAT     07/18/18 1632   07/18/18 1632  Procalcitonin  Once,   STAT     07/18/18 1632   07/18/18 1632  Sedimentation rate  Once,   STAT     07/18/18 1632   07/18/18 1632  Interleukin-6, Plasma  Once,   STAT     07/18/18 1632   07/18/18 1013  Urine culture  ONCE - STAT,   STAT     07/18/18 1013          Vitals/Pain Today's Vitals   07/18/18 1145 07/18/18 1230 07/18/18 1245 07/18/18 1315  BP:  113/75 129/71 127/80  Pulse: 96 92 91 (!) 101  Resp: (!) 21 (!) 23 (!) 21 (!) 21  Temp:      TempSrc:      SpO2: 95% 95% 95% 92%  Weight:      Height:      PainSc:        Isolation Precautions Airborne and Contact precautions  Medications Medications  heparin injection 5,000 Units (has no administration in time range)  acetaminophen (TYLENOL) tablet 650 mg (has no administration in time range)    Or  acetaminophen (TYLENOL) suppository 650 mg (has no administration in time range)  senna-docusate (Senokot-S) tablet 1 tablet (has no administration in time range)  promethazine (PHENERGAN) tablet 12.5 mg (has no administration in time range)  albuterol (VENTOLIN HFA) 108 (90 Base) MCG/ACT inhaler 2 puff (2 puffs Inhalation Given 07/18/18 1359)    Mobility walks with person assist High fall risk   Focused Assessments Neuro Assessment Handoff:  Swallow screen pass? Yes  Cardiac Rhythm: Atrial fibrillation NIH Stroke Scale ( + Modified Stroke Scale Criteria)  Interval: Initial Level of Consciousness (1a.)   : Alert, keenly responsive LOC Questions (1b. )   +: Answers one question correctly LOC Commands (1c. )   + : Performs both  tasks correctly Best Gaze (2. )  +: Normal Visual (3. )  +: No visual loss Facial Palsy (4. )    : Normal symmetrical movements  Motor Arm, Left (5a. )   +: No drift Motor Arm, Right (5b. )   +: No drift Motor Leg, Left (6a. )   +: No drift Motor Leg, Right (6b. )   +: No drift Limb Ataxia (7. ): Absent Sensory (8. )   +: Normal, no sensory loss Best Language (9. )   +: No aphasia Dysarthria (10. ): Normal Extinction/Inattention (11.)   +: No Abnormality Modified SS Total  +: 1 Complete NIHSS TOTAL: 1 Last date known well: 07/17/18 Last time known well: 2000 Neuro Assessment: Exceptions to WDL Neuro Checks:   Initial (07/18/18 1028)  Last Documented NIHSS Modified Score: 1 (07/18/18 1636) Has TPA been given? No If patient is a Neuro Trauma and patient is going to OR before floor call report to [facility] nurse: [phone] or [phone]     R Recommendations: See Admitting Provider Note  Report given to:   Additional Notes: patient alert to self but can carry on a conversation in the present. New onset afib rate 100-115. Pt NIH 1 for confusion. Passed stroke swallow screen. Plan is for a MRI to complete stroke evaluation. Pt with expiratory wheezes, given albuterol MDI with improvement. +covid on room air at 92-95%.

## 2018-07-18 NOTE — Progress Notes (Signed)
Doctor returned call. Information given. Doctor to call night shift covering doctor and have them order Ativan.

## 2018-07-18 NOTE — Consult Note (Addendum)
Neurology Consultation  Reason for Consult: Code stroke Referring Physician: Tegeler  CC: Expressive aphasia and fall  History is obtained from: Family member  HPI: Nicholas [name] is a 85 y.o. male with history of hypertension, glaucoma, asthma, chronic kidney disease stage III.  Per family member patient was last seen normal at approximately 630 this morning.  At approximately 9 AM a second family member who is in the house heard him fall.  When she arrived to the patient, she noticed he was having very difficult time expressing himself.  She would not let him get off the floor for fear he may fall again,thus there is no known weakness on one or the other side.  No facial droop was recognized.  Patient was brought to [facility] as a code stroke.  At time of arrival patient symptoms had resolved however, family member still feels as though he is not back to his total baseline as far as cognition.   ED course labs, x-ray of elbow and hip, CT head.   Chart review patient was just recently at the hospital 2 days ago for evaluation of weakness, dizziness.  Patient's grandson had expressed in the ER that patient appeared weak and was laying down a lot.  On Sunday he was noted to be a little unsteady with his walking complaining of headaches on and off.  MRI brain was obtained which did not show any intracranial abnormalities or significant interval changes and no focal etiology to explain patient's ataxia.  LKW: 6:30 AM tpa given?: no, symptoms resolved Premorbid modified Rankin scale (mRS): 0 NIH stroke score 0  ROS: A 14 point ROS was performed and is negative except as noted in the HPI.  Past Medical History:  Diagnosis Date  . Anemia    none recent  . Arthritis   . Asthma   . Chronic kidney disease    ckd stage 3  . Family history of adverse reaction to anesthesia    daughter has ponv   . Glaucoma   . Hypertension      Family History  Problem Relation Age of Onset  .  Hypertension Mother   . Hypertension Father      Social History:   reports that he has quit smoking. He has never used smokeless tobacco. He reports that he does not drink alcohol or use drugs.  Medications No current facility-administered medications for this encounter.   Current Outpatient Medications:  .  acetaminophen (TYLENOL) 500 MG tablet, Take 500-1,000 mg by mouth every 8 (eight) hours as needed (for pain or headaches). , Disp: , Rfl:  .  albuterol (PROVENTIL HFA;VENTOLIN HFA) 108 (90 Base) MCG/ACT inhaler, Inhale 2 puffs into the lungs 2 (two) times a day. , Disp: , Rfl:  .  allopurinol (ZYLOPRIM) 100 MG tablet, Take 100 mg by mouth daily., Disp: , Rfl:  .  amLODipine (NORVASC) 10 MG tablet, Take 5 mg by mouth daily., Disp: , Rfl:  .  bisoprolol (ZEBETA) 5 MG tablet, Take 2.5 mg by mouth every morning. , Disp: , Rfl:  .  Calcium Carb-Cholecalciferol (CALCIUM 600+D3 PO), Take 2 tablets by mouth daily., Disp: , Rfl:  .  clopidogrel (PLAVIX) 75 MG tablet, Take 1 tablet (75 mg total) by mouth daily., Disp: 30 tablet, Rfl: 11 .  Fluticasone-Salmeterol (ADVAIR) 250-50 MCG/DOSE AEPB, Inhale 1 puff into the lungs 2 (two) times daily., Disp: , Rfl:  .  furosemide (LASIX) 20 MG tablet, Take 1 tablet (20 mg total)  by mouth daily., Disp: 3 tablet, Rfl: 0 .  hydrALAZINE (APRESOLINE) 10 MG tablet, Take 1 tablet (10 mg total) by mouth 3 (three) times daily., Disp: 90 tablet, Rfl: 0 .  latanoprost (XALATAN) 0.005 % ophthalmic solution, Place 1 drop into both eyes at bedtime., Disp: , Rfl:  .  Multiple Vitamin (MULTIVITAMIN WITH MINERALS) TABS tablet, Take 1 tablet by mouth every morning., Disp: , Rfl:  .  Omega-3 Fatty Acids (FISH OIL) 1000 MG CAPS, Take 1,000 mg by mouth daily., Disp: , Rfl:  .  pantoprazole (PROTONIX) 20 MG tablet, Take 1 tablet (20 mg total) by mouth daily., Disp: 30 tablet, Rfl: 3   Exam: Current vital signs: BP 114/90 (BP Location: Left Arm)   Pulse (!) 105   Temp 97.6  F (36.4 C) (Oral)   Resp (!) 22   Ht 6' (1.829 m)   Wt 90 kg   SpO2 96%   BMI 26.91 kg/m  Vital signs in last 24 hours: Temp:  [97.6 F (36.4 C)] 97.6 F (36.4 C) (07/09 1032) Pulse Rate:  [91-105] 105 (07/09 1032) Resp:  [20-22] 22 (07/09 1032) BP: (108-114)/(58-90) 114/90 (07/09 1032) SpO2:  [93 %-96 %] 96 % (07/09 1032) Weight:  [90 kg] 90 kg (07/09 1034)  Physical Exam  Constitutional: Appears well-developed and well-nourished.  Psych: Affect appropriate to situation Eyes: No scleral injection HENT: No OP obstrucion Head: Normocephalic.  Cardiovascular: Normal rate and regular rhythm.  Respiratory: Effort normal, non-labored breathing GI: Soft.  No distension. There is no tenderness.  Skin: WDI  Neuro: Mental Status: Patient is awake, alert, oriented to person, place, month, year, and situation. No signs of aphasia or neglect At points during exam he did show confusion of left and right but with continued examination he was able to give the correct answer Cranial Nerves: II: Visual Fields are full.  III,IV, VI: EOMI without ptosis or diploplia. Pupils equal, round and reactive to light V: Facial sensation is symmetric to temperature VII: Facial movement is symmetric.  VIII: hearing is intact to voice X: Palat elevates symmetrically XI: Shoulder shrug is symmetric. XII: tongue is midline without atrophy or fasciculations.  Motor: Tone is normal. Bulk is normal. 5/5 strength was present in all four extremities.  Sensory: Sensation is symmetric to light touch and temperature in the arms and legs. Deep Tendon Reflexes: 2+ and symmetric in the biceps and patellae.  Plantars: Toes are downgoing bilaterally.  Cerebellar: FNF and HKS are intact bilaterally  Labs I have reviewed labs in epic and the results pertinent to this consultation are:   CBC    Component Value Date/Time   WBC 5.5 07/18/2018 1002   RBC 4.56 07/18/2018 1002   HGB 12.6 (L) 07/18/2018  1005   HCT 37.0 (L) 07/18/2018 1005   PLT 173 07/18/2018 1002   MCV 86.2 07/18/2018 1002   MCH 27.6 07/18/2018 1002   MCHC 32.1 07/18/2018 1002   RDW 14.0 07/18/2018 1002   LYMPHSABS 1.4 07/18/2018 1002   MONOABS 0.5 07/18/2018 1002   EOSABS 0.0 07/18/2018 1002   BASOSABS 0.0 07/18/2018 1002    CMP     Component Value Date/Time   NA 140 07/18/2018 1005   K 3.5 07/18/2018 1005   CL 110 07/18/2018 1005   CO2 19 (L) 07/18/2018 1002   GLUCOSE 97 07/18/2018 1005   BUN 27 (H) 07/18/2018 1005   CREATININE 1.80 (H) 07/18/2018 1005   CALCIUM 8.1 (L) 07/18/2018 1002   PROT  5.6 (L) 07/18/2018 1002   ALBUMIN 2.9 (L) 07/18/2018 1002   AST 31 07/18/2018 1002   ALT 22 07/18/2018 1002   ALKPHOS 72 07/18/2018 1002   BILITOT 0.6 07/18/2018 1002   GFRNONAA 31 (L) 07/18/2018 1002   GFRAA 36 (L) 07/18/2018 1002    Lipid Panel     Component Value Date/Time   CHOL 149 12/10/2017 1157   TRIG 181 (H) 12/10/2017 1157   HDL 40 12/10/2017 1157   CHOLHDL 3.7 12/10/2017 1157   LDLCALC 73 12/10/2017 1157     Imaging I have reviewed the images obtained:  CT-scan of the brain-pending  MRI examination of the brain-pending  Etta Quill PA-C [facility] [phone]  M-F  (9:00 am- 5:00 PM)  07/18/2018, 11:44 AM   I have seen the patient reviewed the above note.  He had a transient episode of speech difficulty which cleared by the time of arrival to the hospital.  Assessment:  85 year old male presenting to hospital as a code stroke.  Initial symptoms included fall and expressive aphasia which at this point time has cleared.  Patient remains slow to understand some commands but eventually is able to follow commands without a problem.  Given patient's period of expressive aphasia and fall which could have been weakness in the setting of new onset atrial fibrillation, TIA certainly has to be considered.  Recommend # MRI of the brain without contrast #MRA Head and neck   #Transthoracic Echo,  #Continue Plavix 75 mg daily #Start  Atorvastatin 80 mg/other high intensity statin # BP goal: permissive HTN upto 220/120 mmHg # HBAIC and Lipid profile # Telemetry monitoring # Frequent neuro checks # NPO until passes stroke swallow screen # please page stroke NP  Or  PA  Or MD from 8am -4 pm  as this patient from this time will be  followed by the stroke.   You can look them up on [email or web address]  Password [email or web address]  Roland Rack, MD [facility] [phone]  If 7pm- 7am, please page neurology on call as listed in [place].

## 2018-07-18 NOTE — Progress Notes (Signed)
Pt left per bed with nurse and transported for MRI. Left in stable condition on 2 L/M [place]

## 2018-07-18 NOTE — ED Notes (Signed)
Cancel code stroke per Dr Sherry Ruffing and Dr Tobias Alexander. Pt going straight to room 34.

## 2018-07-19 ENCOUNTER — Observation Stay (HOSPITAL_BASED_OUTPATIENT_CLINIC_OR_DEPARTMENT_OTHER): Payer: PPO

## 2018-07-19 DIAGNOSIS — I361 Nonrheumatic tricuspid (valve) insufficiency: Secondary | ICD-10-CM | POA: Diagnosis not present

## 2018-07-19 DIAGNOSIS — R4701 Aphasia: Secondary | ICD-10-CM | POA: Diagnosis not present

## 2018-07-19 DIAGNOSIS — I10 Essential (primary) hypertension: Secondary | ICD-10-CM | POA: Diagnosis not present

## 2018-07-19 DIAGNOSIS — H409 Unspecified glaucoma: Secondary | ICD-10-CM | POA: Diagnosis not present

## 2018-07-19 DIAGNOSIS — R451 Restlessness and agitation: Secondary | ICD-10-CM | POA: Diagnosis not present

## 2018-07-19 DIAGNOSIS — Z8249 Family history of ischemic heart disease and other diseases of the circulatory system: Secondary | ICD-10-CM | POA: Diagnosis not present

## 2018-07-19 DIAGNOSIS — G549 Nerve root and plexus disorder, unspecified: Secondary | ICD-10-CM | POA: Diagnosis not present

## 2018-07-19 DIAGNOSIS — E669 Obesity, unspecified: Secondary | ICD-10-CM | POA: Diagnosis not present

## 2018-07-19 DIAGNOSIS — I340 Nonrheumatic mitral (valve) insufficiency: Secondary | ICD-10-CM | POA: Diagnosis not present

## 2018-07-19 DIAGNOSIS — F919 Conduct disorder, unspecified: Secondary | ICD-10-CM | POA: Diagnosis not present

## 2018-07-19 DIAGNOSIS — R0902 Hypoxemia: Secondary | ICD-10-CM

## 2018-07-19 DIAGNOSIS — N183 Chronic kidney disease, stage 3 (moderate): Secondary | ICD-10-CM | POA: Diagnosis not present

## 2018-07-19 DIAGNOSIS — I4891 Unspecified atrial fibrillation: Secondary | ICD-10-CM | POA: Diagnosis not present

## 2018-07-19 DIAGNOSIS — J1289 Other viral pneumonia: Secondary | ICD-10-CM | POA: Diagnosis not present

## 2018-07-19 DIAGNOSIS — J45909 Unspecified asthma, uncomplicated: Secondary | ICD-10-CM | POA: Diagnosis not present

## 2018-07-19 DIAGNOSIS — Z7902 Long term (current) use of antithrombotics/antiplatelets: Secondary | ICD-10-CM | POA: Diagnosis not present

## 2018-07-19 DIAGNOSIS — Z87891 Personal history of nicotine dependence: Secondary | ICD-10-CM | POA: Diagnosis not present

## 2018-07-19 DIAGNOSIS — U071 COVID-19: Secondary | ICD-10-CM | POA: Diagnosis not present

## 2018-07-19 DIAGNOSIS — Z823 Family history of stroke: Secondary | ICD-10-CM | POA: Diagnosis not present

## 2018-07-19 DIAGNOSIS — I129 Hypertensive chronic kidney disease with stage 1 through stage 4 chronic kidney disease, or unspecified chronic kidney disease: Secondary | ICD-10-CM | POA: Diagnosis not present

## 2018-07-19 DIAGNOSIS — E785 Hyperlipidemia, unspecified: Secondary | ICD-10-CM | POA: Diagnosis not present

## 2018-07-19 DIAGNOSIS — J1282 Pneumonia due to coronavirus disease 2019: Secondary | ICD-10-CM | POA: Diagnosis present

## 2018-07-19 DIAGNOSIS — J019 Acute sinusitis, unspecified: Secondary | ICD-10-CM | POA: Diagnosis not present

## 2018-07-19 DIAGNOSIS — Z79899 Other long term (current) drug therapy: Secondary | ICD-10-CM | POA: Diagnosis not present

## 2018-07-19 DIAGNOSIS — N289 Disorder of kidney and ureter, unspecified: Secondary | ICD-10-CM | POA: Diagnosis not present

## 2018-07-19 DIAGNOSIS — G459 Transient cerebral ischemic attack, unspecified: Secondary | ICD-10-CM | POA: Diagnosis not present

## 2018-07-19 DIAGNOSIS — Z880 Allergy status to penicillin: Secondary | ICD-10-CM | POA: Diagnosis not present

## 2018-07-19 DIAGNOSIS — H9190 Unspecified hearing loss, unspecified ear: Secondary | ICD-10-CM | POA: Diagnosis not present

## 2018-07-19 DIAGNOSIS — Z8673 Personal history of transient ischemic attack (TIA), and cerebral infarction without residual deficits: Secondary | ICD-10-CM | POA: Diagnosis not present

## 2018-07-19 DIAGNOSIS — F40240 Claustrophobia: Secondary | ICD-10-CM | POA: Diagnosis not present

## 2018-07-19 DIAGNOSIS — W19XXXA Unspecified fall, initial encounter: Secondary | ICD-10-CM | POA: Diagnosis present

## 2018-07-19 DIAGNOSIS — Z6828 Body mass index (BMI) 28.0-28.9, adult: Secondary | ICD-10-CM | POA: Diagnosis not present

## 2018-07-19 LAB — COMPREHENSIVE METABOLIC PANEL
ALT: 23 U/L (ref 0–44)
AST: 32 U/L (ref 15–41)
Albumin: 2.8 g/dL — ABNORMAL LOW (ref 3.5–5.0)
Alkaline Phosphatase: 76 U/L (ref 38–126)
Anion gap: 11 (ref 5–15)
BUN: 28 mg/dL — ABNORMAL HIGH (ref 8–23)
CO2: 21 mmol/L — ABNORMAL LOW (ref 22–32)
Calcium: 8.2 mg/dL — ABNORMAL LOW (ref 8.9–10.3)
Chloride: 110 mmol/L (ref 98–111)
Creatinine, Ser: 1.82 mg/dL — ABNORMAL HIGH (ref 0.61–1.24)
GFR calc Af Amer: 39 mL/min — ABNORMAL LOW (ref >60)
GFR calc non Af Amer: 34 mL/min — ABNORMAL LOW (ref >60)
Glucose, Bld: 79 mg/dL (ref 70–99)
Potassium: 3.7 mmol/L (ref 3.5–5.1)
Sodium: 142 mmol/L (ref 135–145)
Total Bilirubin: 0.7 mg/dL (ref 0.3–1.2)
Total Protein: 5.2 g/dL — ABNORMAL LOW (ref 6.5–8.1)

## 2018-07-19 LAB — CBC
HCT: 37.8 % — ABNORMAL LOW (ref 39.0–52.0)
Hemoglobin: 12.1 g/dL — ABNORMAL LOW (ref 13.0–17.0)
MCH: 27.4 pg (ref 26.0–34.0)
MCHC: 32 g/dL (ref 30.0–36.0)
MCV: 85.5 fL (ref 80.0–100.0)
Platelets: 191 10*3/uL (ref 150–400)
RBC: 4.42 MIL/uL (ref 4.22–5.81)
RDW: 14 % (ref 11.5–15.5)
WBC: 5.2 10*3/uL (ref 4.0–10.5)
nRBC: 0 % (ref 0.0–0.2)

## 2018-07-19 LAB — URINE CULTURE: Culture: NO GROWTH

## 2018-07-19 LAB — ECHOCARDIOGRAM LIMITED
Height: 72 in
Weight: 3344 oz

## 2018-07-19 LAB — INTERLEUKIN-6, PLASMA: Interleukin-6, Plasma: 154.5 pg/mL — ABNORMAL HIGH (ref 0.0–12.2)

## 2018-07-19 MED ORDER — HALOPERIDOL LACTATE 5 MG/ML IJ SOLN
1.0000 mg | Freq: Once | INTRAMUSCULAR | Status: AC
  Administered 2018-07-21: 1 mg via INTRAVENOUS
  Filled 2018-07-19 (×3): qty 1

## 2018-07-19 MED ORDER — DEXAMETHASONE 4 MG PO TABS
6.0000 mg | ORAL_TABLET | Freq: Every day | ORAL | Status: DC
  Administered 2018-07-19 – 2018-07-21 (×3): 6 mg via ORAL
  Filled 2018-07-19 (×3): qty 2

## 2018-07-19 MED ORDER — SODIUM CHLORIDE 0.9 % IV SOLN
100.0000 mg | INTRAVENOUS | Status: DC
  Administered 2018-07-20: 100 mg via INTRAVENOUS
  Filled 2018-07-19 (×4): qty 20

## 2018-07-19 MED ORDER — SODIUM CHLORIDE 0.9 % IV SOLN
200.0000 mg | Freq: Once | INTRAVENOUS | Status: AC
  Administered 2018-07-19: 200 mg via INTRAVENOUS
  Filled 2018-07-19: qty 40

## 2018-07-19 NOTE — Progress Notes (Signed)
  Echocardiogram 2D Echocardiogram has been performed.  Stormy Connon L Androw 07/19/2018, 8:45 AM

## 2018-07-19 NOTE — Evaluation (Signed)
Clinical/Bedside Swallow Evaluation Patient Details  Name: Nicholas [name] MRN: [id number] Date of Birth: 1933/12/28  Today's Date: 07/19/2018 Time: SLP Start Time (ACUTE ONLY): 2426 SLP Stop Time (ACUTE ONLY): 1213 SLP Time Calculation (min) (ACUTE ONLY): 14 min  Past Medical History:  Past Medical History:  Diagnosis Date  . Anemia    none recent  . Arthritis   . Asthma   . Chronic kidney disease    ckd stage 3  . Family history of adverse reaction to anesthesia    daughter has ponv   . Glaucoma   . Hypertension    Past Surgical History:  Past Surgical History:  Procedure Laterality Date  . COLONOSCOPY WITH PROPOFOL N/A 02/03/2015   Procedure: COLONOSCOPY WITH PROPOFOL;  Surgeon: Arta Silence, MD;  Location: [facility] ENDOSCOPY;  Service: Endoscopy;  Laterality: N/A;  . KNEE ARTHROSCOPY Left   . ROTATOR CUFF REPAIR Right   . SHOULDER ARTHROSCOPY Left    HPI:  Pt is an 85 yo male s/p TIA symptoms with AMS and slurred speech s/p fall. CT head negative and pt claustrophobic so unable to obtain MRI. Pmhx: HTN, asthma, KD stage III, H?O TIAs, glaucoma, HLD. Denies any history of dysphagia, but was observed to have some coughing when taking liquids with RN.    Assessment / Plan / Recommendation Clinical Impression  Pt demonstrates normal swallow function for age. Dry mouth noted, intermittent slight wet vocal quality post swallow. Pt does not present with any acute impairments or concern for significant baseline dysphagia. Pt may resume home diet. Will sign off.  SLP Visit Diagnosis: Dysphagia, unspecified (R13.10)    Aspiration Risk  Mild aspiration risk    Diet Recommendation Regular;Thin liquid   Liquid Administration via: Cup;Straw Medication Administration: Whole meds with liquid Supervision: Patient able to self feed Compensations: Slow rate;Small sips/bites Postural Changes: Seated upright at 90 degrees    Other  Recommendations     Follow up Recommendations         Frequency and Duration            Prognosis        Swallow Study   General HPI: Pt is an 85 yo male s/p TIA symptoms with AMS and slurred speech s/p fall. CT head negative and pt claustrophobic so unable to obtain MRI. Pmhx: HTN, asthma, KD stage III, H?O TIAs, glaucoma, HLD. Denies any history of dysphagia, but was observed to have some coughing when taking liquids with RN.  Type of Study: Bedside Swallow Evaluation Previous Swallow Assessment: none Diet Prior to this Study: NPO Temperature Spikes Noted: No Respiratory Status: Nasal cannula History of Recent Intubation: No Behavior/Cognition: Alert;Cooperative Oral Cavity Assessment: Within Functional Limits;Dry Oral Care Completed by SLP: No Oral Cavity - Dentition: Dentures, top;Dentures, bottom Self-Feeding Abilities: Able to feed self Patient Positioning: Upright in bed Baseline Vocal Quality: Normal Volitional Cough: Strong Volitional Swallow: Able to elicit    Oral/Motor/Sensory Function Overall Oral Motor/Sensory Function: Within functional limits   Ice Chips Ice chips: Within functional limits   Thin Liquid Thin Liquid: Within functional limits Presentation: Cup;Straw;Self Fed    Nectar Thick Nectar Thick Liquid: Not tested   Honey Thick Honey Thick Liquid: Not tested   Puree Puree: Within functional limits   Solid     Solid: Within functional limits     Herbie [place], MA [place] Pager [phone] Office [phone]  Lynann Beaver 07/19/2018,12:25 PM

## 2018-07-19 NOTE — Progress Notes (Addendum)
STROKE TEAM PROGRESS NOTE   INTERVAL HISTORY I have personally reviewed history of presenting illness, electronic medical records and imaging films in PACS.  Patient is unable to provide a clear history.  He states he fell but is unable to describe the circumstances and what made him fall.  He appears confused and disoriented and is having some expressive speech difficulties which have persisted.  MRI scan of the brain was attempted this morning but patient was not cooperative and hence not done.  His blood pressure is adequately controlled.  Vitals:   07/19/18 0017 07/19/18 0443 07/19/18 0613 07/19/18 0746  BP:  98/69  108/75  Pulse:  92  99  Resp:  18    Temp:  98.1 F (36.7 C)  98.2 F (36.8 C)  TempSrc:  Oral  Oral  SpO2: 93% 94%  91%  Weight:   94.8 kg   Height:        CBC:  Recent Labs  Lab 07/15/18 2125  07/18/18 1002 07/18/18 1005 07/19/18 0529  WBC 8.1  --  5.5  --  5.2  NEUTROABS 5.1  --  3.6  --   --   HGB 12.4*   < > 12.6* 12.6* 12.1*  HCT 39.4   < > 39.3 37.0* 37.8*  MCV 87.2  --  86.2  --  85.5  PLT 228  --  173  --  191   < > = values in this interval not displayed.    Basic Metabolic Panel:  Recent Labs  Lab 07/18/18 1002 07/18/18 1005 07/18/18 1012 07/19/18 0529  NA 141 140  --  142  K 3.6 3.5  --  3.7  CL 111 110  --  110  CO2 19*  --   --  21*  GLUCOSE 97 97  --  79  BUN 29* 27*  --  28*  CREATININE 1.94* 1.80*  --  1.82*  CALCIUM 8.1*  --   --  8.2*  MG  --   --  1.8  --    Lipid Panel:     Component Value Date/Time   CHOL 149 12/10/2017 1157   TRIG 181 (H) 12/10/2017 1157   HDL 40 12/10/2017 1157   CHOLHDL 3.7 12/10/2017 1157   LDLCALC 73 12/10/2017 1157   HgbA1c:  Lab Results  Component Value Date   HGBA1C 5.8 (H) 12/10/2017   Urine Drug Screen: No results found for: LABOPIA, COCAINSCRNUR, LABBENZ, AMPHETMU, THCU, LABBARB  Alcohol Level No results found for: Nicholas [name]  IMAGING Dg Elbow Complete Right  Result Date:  07/18/2018 CLINICAL DATA:  Right elbow pain after a fall today. Laceration. EXAM: RIGHT ELBOW - COMPLETE 3+ VIEW COMPARISON:  None. FINDINGS: There is no fracture or dislocation. Slight calcific tendinopathy in the origins of the common extensor and common flexor tendons from the distal humeral condyles. No joint effusion IMPRESSION: No acute abnormality. Electronically Signed   By: Lorriane Shire M.D.   On: 07/18/2018 11:15   Ct Head Wo Contrast  Result Date: 07/18/2018 CLINICAL DATA:  Slurred speech EXAM: CT HEAD WITHOUT CONTRAST TECHNIQUE: Contiguous axial images were obtained from the base of the skull through the vertex without intravenous contrast. COMPARISON:  10/25/2017, 07/15/2018 FINDINGS: Brain: No evidence of acute infarction, hemorrhage, extra-axial collection, ventriculomegaly, or mass effect. Mega cisterna magna again noted. Generalized cerebral atrophy. Periventricular white matter low attenuation likely secondary to microangiopathy. Vascular: Cerebrovascular atherosclerotic calcifications are noted. Skull: Negative for fracture or focal lesion. Sinuses/Orbits:  Visualized portions of the orbits are unremarkable. Mastoid sinuses are clear. Mucosal thickening of bilateral maxillary sinuses, ethmoid sinuses and left sphenoid sinus. Other: None. IMPRESSION: 1. No acute intracranial pathology. Electronically Signed   By: Kathreen Devoid   On: 07/18/2018 11:42   Dg Chest Port 1 View  Result Date: 07/18/2018 CLINICAL DATA:  Dyspnea EXAM: PORTABLE CHEST 1 VIEW COMPARISON:  July 16, 2018 FINDINGS: Again noted is cardiomegaly with mild vascular congestion. There is no overt pulmonary edema. There is a rounded density at the right lung base measuring approximately 1.4 cm. This was not well appreciated on prior exam. There is no pneumothorax. There is no large pleural effusion. Patient is status post prior total shoulder arthroplasty on the right. IMPRESSION: 1. Cardiomegaly with mild vascular congestion. 2.  Rounded airspace opacity at the right lung base, not well appreciated on prior exam. Attention on follow-up examinations is recommended. Electronically Signed   By: Constance Holster M.D.   On: 07/18/2018 18:55   Dg Hip Unilat W Or Wo Pelvis 2-3 Views Left  Result Date: 07/18/2018 CLINICAL DATA:  Left hip pain secondary to a fall today. EXAM: DG HIP (WITH OR WITHOUT PELVIS) 2-3V LEFT COMPARISON:  None. FINDINGS: There is no fracture or dislocation. The sacroiliac joints appear to be fused. Visualized bowel gas pattern is normal. IMPRESSION: No acute abnormality. Electronically Signed   By: Lorriane Shire M.D.   On: 07/18/2018 11:13    PHYSICAL EXAM Frail malnourished looking elderly Caucasian male not in distress. . Afebrile. Head is nontraumatic. Neck is supple without bruit.    Cardiac exam no murmur or gallop. Lungs are clear to auscultation. Distal pulses are well felt. Neurological exam Awake alert oriented to person only.  Diminished attention, registration and recall.  He cannot tell me the name of the current president.  He has nonfluent speech with some word finding difficulties but can speak short sentences.  He follows simple midline in 1 and a few two-step commands.  Extraocular movements are full range without nystagmus.  He blinks to threat bilaterally.  Face is symmetric without weakness.  Tongue is midline.  Motor system exam reveals symmetric upper and lower extremity strength no focal weakness.  Gait was not tested.  Sensation appears intact bilaterally. ASSESSMENT/PLAN Nicholas [name] is a 85 y.o. male with history of hypertension, glaucoma, asthma, chronic kidney disease stage III with recent hospitalization and discharged 2 days prior complaining of intermittent headache presenting with expressive aphasia and fall.   Probable TIA due to new onset A. fib versus mild concussion from fall  CT head no acute abnormality  MRI pending  MRA head pending  MRA neck  pending  Carotid Doppler not ordered   2D Echo pending   LDL ordered  HgbA1c pending   Heparin 5000 units sq tid for VTE prophylaxis  clopidogrel 75 mg daily prior to admission, now on clopidogrel 75 mg daily.  Consider switching to Eliquis given atrial fibrillation and high chads 2 Vascor  Therapy recommendations:  pending   Disposition:  pending   Atrial Fibrillation, new onset  Home anticoagulation:  none   Anticoagulation recommended from stroke standpoint if A. fib confirmed   COVID-19   Asymptomatic   swab positive in ED  Hypertension   Stable . BP goal normotensive from stroke standpoint given TIA  Hyperlipidemia  Home meds: Fish oil  LDL ordered, goal < 70  Add statin if LDL greater than or equal to 70  Other  Stroke Risk Factors  Advanced age  Former cigarette smoker   Overweight, Body mass index is 28.35 kg/m., recommend weight loss, diet and exercise as appropriate   Other Active Problems  CKD stage III  Behavioral disturbance requiring [place] [name] day # 1  I have personally obtained history,examined this patient, reviewed notes, independently viewed imaging studies, participated in medical decision making and plan of care.ROS completed by me personally and pertinent positives fully documented  I have made any additions or clarifications directly to the above note.  He has presented with new onset A. fib and a fall of unclear etiology but has had some confusion, agitation and speech difficulties following this unclear as to whether he had a TIA or a stroke or just concussion from the fall.  He was unable to cooperate for an MRI.  Recommend Haldol 1 mg on call for MRI.  He will likely need anticoagulation for his new onset A. fib for stroke prevention.  Continue plavix for now till MRI is completed.  Check echo cardiogram lipid profile and A1c.  Greater than 50% time during this 25-minute visit was spent on counseling and coordination of care  about possible TIA or stroke and atrial fibrillation and discussion of risk-benefit of anticoagulation and answering questions.  Antony Contras, MD Medical Director [facility] Pager: [phone] 07/19/2018 2:32 PM   To contact Stroke Continuity provider, please refer to [email or web address] After hours, contact General Neurology

## 2018-07-19 NOTE — Evaluation (Signed)
Occupational Therapy Evaluation Patient Details Name: Nicholas [name] MRN: [id number] DOB: 01-26-1933 Today's Date: 07/19/2018    History of Present Illness Pt is an 85 yo male s/p TIA symptoms with AMS and slurred speech s/p fall. CT head negative and pt claustrophobic so unable to obtain MRI. Pmhx: HTN, asthma, KD stage III, H?O TIAs, glaucoma, HLD.   Clinical Impression   Pt PTA: living with family and reports independence with ADL and mobility. Pt performing ADL tasks with minA for LB, but appears baseline as he did not attempt to bend. Pt grooming at sink in sitting and transferring to commode with minguardA for all mobility. Pt supervisionA for bed mobility. Pt desats to 87% on RA so pt requires O2 at 2L O2 >90% w/ exertion. Pt would benefit from continued OT skilled services for ADL, mobility and safety in [place] setting. Pt appears close to baseline functioning with the exception of O2 requirement.  HR <105 BPM with exertion.  Pt denied need for [facility] therapy. Pt continues to have episodes per chart. I believe education and addressing home safety evaluation could be beneficial upon D/C home.       Follow Up Recommendations  Home health OT;Supervision - Intermittent    Equipment Recommendations  None recommended by OT    Recommendations for Other Services       Precautions / Restrictions Precautions Precautions: Fall;Other (comment) Precaution Comments: watch O2 Restrictions Weight Bearing Restrictions: No      Mobility Bed Mobility Overal bed mobility: Needs Assistance Bed Mobility: Sidelying to Sit;Sit to Sidelying   Sidelying to sit: Supervision     Sit to sidelying: Supervision General bed mobility comments: + use of rail, no physical assist required  Transfers Overall transfer level: Needs assistance Equipment used: None Transfers: Sit to/from Stand Sit to Stand: Min guard         General transfer comment: minguardA for initial standing balance     Balance Overall balance assessment: Mild deficits observed, not formally tested                                         ADL either performed or assessed with clinical judgement   ADL Overall ADL's : At baseline                                       General ADL Comments: Pt set-upA for ADL in sitting mostly due to back pain. Pt performing LB dressing with minA due to low back pain and pt being HOH. Pt requires SPo2 [place] at 2L O2 as pt desats to 87% on RA and requires 2L [place] to maintain >90%.     Vision Baseline Vision/History: Wears glasses Wears Glasses: At all times Vision Assessment?: No apparent visual deficits     Perception     Praxis      Pertinent Vitals/Pain       Hand Dominance Right   Extremity/Trunk Assessment Upper Extremity Assessment Upper Extremity Assessment: Overall WFL for tasks assessed   Lower Extremity Assessment Lower Extremity Assessment: Overall WFL for tasks assessed   Cervical / Trunk Assessment Cervical / Trunk Assessment: Kyphotic   Communication Communication Communication: HOH   Cognition Arousal/Alertness: Awake/alert Behavior During Therapy: WFL for tasks assessed/performed Overall Cognitive Status: Within Functional Limits for tasks assessed  General Comments  Pt reports that "I have plenty of help at home" regarding allowing HH therapies to go home with.    Exercises     Shoulder Instructions      Home Living Family/patient expects to be discharged to:: Private residence Living Arrangements: Other relatives Available Help at Discharge: Family Type of Home: House Home Access: Ramped entrance     [place]: One level     Bathroom Shower/Tub: Occupational psychologist: Handicapped height     [place]: None          Prior Functioning/Environment Level of Independence: Independent        Comments: mowing lawn and  caring for self        OT Problem List: Decreased strength;Decreased activity tolerance;Impaired balance (sitting and/or standing)      OT Treatment/Interventions: Self-care/ADL training;Therapeutic exercise;Neuromuscular education;Energy conservation;Therapeutic activities;Patient/family education;Balance training    OT Goals(Current goals can be found in the care plan section) Acute Rehab OT Goals Patient Stated Goal: to go home OT Goal Formulation: With patient Time For Goal Achievement: 08/02/18 Potential to Achieve Goals: Good ADL Goals Pt Will Perform Grooming: with modified independence;standing Pt Will Perform Toileting - Clothing Manipulation and hygiene: with modified independence;sit to/from stand Additional ADL Goal #1: Pt will be modified independence with OOB ADL with O2 levels >90% on 1L O2.  OT Frequency: Min 2X/week   Barriers to D/C:            Co-evaluation              AM-PAC OT "6 Clicks" Daily Activity     Outcome Measure Help from another person eating meals?: None Help from another person taking care of personal grooming?: None Help from another person toileting, which includes using toliet, bedpan, or urinal?: None Help from another person bathing (including washing, rinsing, drying)?: A Little Help from another person to put on and taking off regular upper body clothing?: None Help from another person to put on and taking off regular lower body clothing?: A Little 6 Click Score: 22   End of Session Equipment Utilized During Treatment: Gait belt Nurse Communication: Mobility status;Other (comment)(O2)  Activity Tolerance: Patient tolerated treatment well Patient left: in bed;with call bell/phone within reach;with bed alarm set  OT Visit Diagnosis: Muscle weakness (generalized) (M62.81);Unsteadiness on feet (R26.81)                Time: 2836-6294 OT Time Calculation (min): 34 min Charges:  OT General Charges $OT Visit: 1 Visit OT  Evaluation $OT Eval Moderate Complexity: 1 Mod OT Treatments $Self Care/Home Management : 8-22 mins  Ebony Hail Harold Hedge) Marsa Aris OTR/L Acute Rehabilitation Services Pager: [phone] Office: [place] 07/19/2018, 9:44 AM

## 2018-07-19 NOTE — Progress Notes (Signed)
  Date: 07/19/2018  Patient name: Nicholas [name]  Medical record number: [id number]  Date of birth: 30-Oct-1933    Subjective: Reports feeling fine today. Has not noticed any difficulty speaking or walking. Mouth is very dry.  Unable to complete MRI overnight due to claustrophobia. Developed hypoxia requiring 2L [place] overnight.   Objective:  Vital signs in last 24 hours: Vitals:   07/19/18 0017 07/19/18 0443 07/19/18 0613 07/19/18 0746  BP:  98/69  108/75  Pulse:  92  99  Resp:  18    Temp:  98.1 F (36.7 C)  98.2 F (36.8 C)  TempSrc:  Oral  Oral  SpO2: 93% 94%  91%  Weight:   94.8 kg   Height:        Physical Exam: General: lying in bed, comfortable HEENT: normal sclera, dry mucous membranes Cardiac: irregularly irregular, brisk pulses and warm extremities, no edema Pulmonary: lungs difficult to hear with room noise and disposable stethoscope, sound diminished in RLL, normal respiratory effort Abdomen: soft, non-tender, no masses, normal bowel sounds Extremities: No synovitis or joint effusions Skin: laceration on right elbow covered with dressing, small laceration and bruise on right knee Neuro: Alert, normal speech, PERRL, EOMI, smile symmetric, facial sensation intact and equal, tongue protrudes midline, shoulder shrug symmetric. Strength 5/5 throughout, sensation equal and intact to light touch.  Significant new test results: LDH 201 Ferritin 56 CRP 7.7 Procaclitonin <0.1 Ddimer 0.6 Fibrinogen 527  EKG personally review and shows atrial fibrillation with bifascicular block  Assessment/Plan:  Principal Problem:   TIA (transient ischemic attack) Active Problems:   CHF (congestive heart failure) (HCC)   HTN (hypertension)   Renal insufficiency   COVID-19 virus infection   Nicholas [name] is an 85 y.o man with HTN, asthma, CKD stage III, and TIA who presented with aphasia and confusion after a fall this morning, now seemingly back to baseline. Also SARS-CoV2 positive   TIA  High concern for TIA with fall possibly representing weakness or imbalance and aphasia, but now seemingly back to baseline. CT head negative. Arrived as a code stroke, Neurology following, appreciate consultation.   -Will attempt MRI again with pretreatment with haldol per neuro recommendation -Continue Plavix 75 mg daily -Start atorvastatin 80 mg daily -Allow permissive hypertension up to 220/120 -PT/OT/SLP eval -Hemoglobin A1c pending -Lipid panel pending -TTE -Continuous cardiac monitoring -Airborne and contact precautions  New Afib Diagnosed this admission, may have been leading to his recurrent TIAs  -Will need anticoagulation when safe from a stroke/TIA perspective -Rate well controlled  COVID-19 No symptoms on presentation, now mildly hypoxic. Inflammatory labs moderately elevated.  -CXR with mild bilateral hazy opacities and small round opacity in R lung base -O2 to maintain sat >92% -monitor fever curve -will discuss with CCM possibly starting treatment with remdesivir or dexamethasone   Hypertension Patient is an admission has ranged 100-120/80s.  Patient takes bisoprolol 2.5 mg daily, amlodipine 10 mg  -Holding home blood pressure medications to allow for permissive hypertension  CKD3 Creatinine 1.8, baseline ranging 1.7-2.3.   -Will continue to monitor   Dispo: Will need to stay to complete TIA workup and for hypoxia likely from COVID-19.   Lenice Pressman, M.D., Ph.D. 07/19/2018, 1:57 PM

## 2018-07-19 NOTE — Care Management CC44 (Signed)
Condition Code 44 Documentation Completed  Patient Details  Name: Nicholas [name] MRN: [id number] Date of Birth: Dec 29, 1933   Condition Code 44 given:  Yes Patient signature on Condition Code 44 notice:  Yes Documentation of 2 MD's agreement:  Yes Code 44 added to claim:  Yes    Zenon Mayo, RN 07/19/2018, 2:52 PM

## 2018-07-19 NOTE — Care Management Obs Status (Signed)
[place] NOTIFICATION   Patient Details  Name: Nicholas [name] MRN: [id number] Date of Birth: [birth year]   Medicare Observation Status Notification Given:  Yes    Zenon Mayo, RN 07/19/2018, 2:52 PM

## 2018-07-19 NOTE — Evaluation (Signed)
Physical Therapy Evaluation Patient Details Name: Nicholas [name] MRN: [id number] DOB: 03/06/1933 Today's Date: 07/19/2018   History of Present Illness  Pt is an 85 yo male s/p TIA symptoms with AMS and slurred speech s/p fall. CT head negative and pt claustrophobic so unable to obtain MRI. Pmhx: HTN, asthma, KD stage III, H?O TIAs, glaucoma, HLD.  Clinical Impression  Pt presents to PT with slightly unsteady gait which is likely partially due to pt not having his shoes to wear. Recommend return home with family when medically ready. Pt amb on 2L of O2 with dyspnea 2/4. Pt's pulse oximeter was not functioning so could not check SpO2. Nursing aware.     Follow Up Recommendations Home health PT;Supervision for mobility/OOB    Equipment Recommendations  None recommended by PT    Recommendations for Other Services       Precautions / Restrictions Precautions Precautions: Fall;Other (comment) Precaution Comments: watch O2 Restrictions Weight Bearing Restrictions: No      Mobility  Bed Mobility Overal bed mobility: Modified Independent Bed Mobility: Sidelying to Sit;Sit to Supine   Sidelying to sit: Modified independent (Device/Increase time);HOB elevated   Sit to supine: Modified independent (Device/Increase time)   General bed mobility comments: use of rail  Transfers Overall transfer level: Needs assistance Equipment used: None Transfers: Sit to/from Stand Sit to Stand: Min guard         General transfer comment: Assist for safety.  Ambulation/Gait Ambulation/Gait assistance: Min assist Gait Distance (Feet): 80 Feet Assistive device: None;Rolling walker (2 wheeled) Gait Pattern/deviations: Step-through pattern;Decreased stride length;Drifts right/left Gait velocity: decr Gait velocity interpretation: 1.31 - 2.62 ft/sec, indicative of limited community ambulator General Gait Details: Pt with slightly unsteady gait with assist for balance. Rolling walker didn't  improve stability.  Stairs            Wheelchair Mobility    Modified Rankin (Stroke Patients Only) Modified Rankin (Stroke Patients Only) Pre-Morbid Rankin Score: No significant disability Modified Rankin: Moderately severe disability     Balance Overall balance assessment: Needs assistance Sitting-balance support: No upper extremity supported;Feet supported Sitting balance-Leahy Scale: Good     Standing balance support: No upper extremity supported;During functional activity Standing balance-Leahy Scale: Fair Standing balance comment: Assist for dynamic activities                             Pertinent Vitals/Pain Pain Assessment: No/denies pain    Home Living Family/patient expects to be discharged to:: Private residence Living Arrangements: Other relatives Available Help at Discharge: Family Type of Home: House Home Access: Ramped entrance     Home Layout: One level Home Equipment: None      Prior Function Level of Independence: Independent         Comments: mowing lawn and caring for self     Hand Dominance   Dominant Hand: Right    Extremity/Trunk Assessment   Upper Extremity Assessment Upper Extremity Assessment: Overall WFL for tasks assessed    Lower Extremity Assessment Lower Extremity Assessment: Overall WFL for tasks assessed    Cervical / Trunk Assessment Cervical / Trunk Assessment: Kyphotic  Communication   Communication: HOH  Cognition Arousal/Alertness: Awake/alert Behavior During Therapy: WFL for tasks assessed/performed Overall Cognitive Status: Within Functional Limits for tasks assessed  General Comments      Exercises     Assessment/Plan    PT Assessment Patient needs continued PT services  PT Problem List Decreased balance;Decreased mobility;Decreased activity tolerance       PT Treatment Interventions DME instruction;Gait  training;Functional mobility training;Therapeutic activities;Therapeutic exercise;Balance training;Patient/family education    PT Goals (Current goals can be found in the Care Plan section)  Acute Rehab PT Goals Patient Stated Goal: to go home PT Goal Formulation: With patient Time For Goal Achievement: 07/26/18 Potential to Achieve Goals: Good    Frequency Min 3X/week   Barriers to discharge        Co-evaluation               AM-PAC PT "6 Clicks" Mobility  Outcome Measure Help needed turning from your back to your side while in a flat bed without using bedrails?: None Help needed moving from lying on your back to sitting on the side of a flat bed without using bedrails?: None Help needed moving to and from a bed to a chair (including a wheelchair)?: A Little Help needed standing up from a chair using your arms (e.g., wheelchair or bedside chair)?: A Little Help needed to walk in hospital room?: A Little Help needed climbing 3-5 steps with a railing? : A Little 6 Click Score: 20    End of Session Equipment Utilized During Treatment: Oxygen Activity Tolerance: Patient tolerated treatment well Patient left: in bed;with call bell/phone within reach;with bed alarm set Nurse Communication: Mobility status;Other (comment)(pulse ox not functioning) PT Visit Diagnosis: Unsteadiness on feet (R26.81)    Time: 0174-9449 PT Time Calculation (min) (ACUTE ONLY): 34 min   Charges:   PT Evaluation $PT Eval Moderate Complexity: 1 Mod PT Treatments $Gait Training: 8-22 mins        [place] Pager [phone] Office [place] 07/19/2018, 1:19 PM

## 2018-07-20 ENCOUNTER — Inpatient Hospital Stay (HOSPITAL_COMMUNITY): Payer: PPO

## 2018-07-20 LAB — LIPID PANEL
Cholesterol: 111 mg/dL (ref 0–200)
HDL: 37 mg/dL — ABNORMAL LOW (ref >40)
LDL Cholesterol: 62 mg/dL (ref 0–99)
Total CHOL/HDL Ratio: 3 RATIO
Triglycerides: 61 mg/dL (ref <150)
VLDL: 12 mg/dL (ref 0–40)

## 2018-07-20 LAB — CBC WITH DIFFERENTIAL/PLATELET
Abs Immature Granulocytes: 0.03 10*3/uL (ref 0.00–0.07)
Basophils Absolute: 0 10*3/uL (ref 0.0–0.1)
Basophils Relative: 0 %
Eosinophils Absolute: 0 10*3/uL (ref 0.0–0.5)
Eosinophils Relative: 0 %
HCT: 41.1 % (ref 39.0–52.0)
Hemoglobin: 13 g/dL (ref 13.0–17.0)
Immature Granulocytes: 1 %
Lymphocytes Relative: 47 %
Lymphs Abs: 1.9 10*3/uL (ref 0.7–4.0)
MCH: 27.1 pg (ref 26.0–34.0)
MCHC: 31.6 g/dL (ref 30.0–36.0)
MCV: 85.8 fL (ref 80.0–100.0)
Monocytes Absolute: 0.2 10*3/uL (ref 0.1–1.0)
Monocytes Relative: 5 %
Neutro Abs: 1.9 10*3/uL (ref 1.7–7.7)
Neutrophils Relative %: 47 %
Platelets: 222 10*3/uL (ref 150–400)
RBC: 4.79 MIL/uL (ref 4.22–5.81)
RDW: 13.8 % (ref 11.5–15.5)
WBC: 4 10*3/uL (ref 4.0–10.5)
nRBC: 0 % (ref 0.0–0.2)

## 2018-07-20 LAB — COMPREHENSIVE METABOLIC PANEL
ALT: 28 U/L (ref 0–44)
AST: 41 U/L (ref 15–41)
Albumin: 2.9 g/dL — ABNORMAL LOW (ref 3.5–5.0)
Alkaline Phosphatase: 80 U/L (ref 38–126)
Anion gap: 13 (ref 5–15)
BUN: 36 mg/dL — ABNORMAL HIGH (ref 8–23)
CO2: 19 mmol/L — ABNORMAL LOW (ref 22–32)
Calcium: 8.5 mg/dL — ABNORMAL LOW (ref 8.9–10.3)
Chloride: 109 mmol/L (ref 98–111)
Creatinine, Ser: 1.6 mg/dL — ABNORMAL HIGH (ref 0.61–1.24)
GFR calc Af Amer: 46 mL/min — ABNORMAL LOW (ref >60)
GFR calc non Af Amer: 40 mL/min — ABNORMAL LOW (ref >60)
Glucose, Bld: 130 mg/dL — ABNORMAL HIGH (ref 70–99)
Potassium: 4.2 mmol/L (ref 3.5–5.1)
Sodium: 141 mmol/L (ref 135–145)
Total Bilirubin: 0.6 mg/dL (ref 0.3–1.2)
Total Protein: 5.9 g/dL — ABNORMAL LOW (ref 6.5–8.1)

## 2018-07-20 LAB — HEMOGLOBIN A1C
Hgb A1c MFr Bld: 5.9 % — ABNORMAL HIGH (ref 4.8–5.6)
Mean Plasma Glucose: 123 mg/dL

## 2018-07-20 LAB — FIBRINOGEN: Fibrinogen: 531 mg/dL — ABNORMAL HIGH (ref 210–475)

## 2018-07-20 LAB — C-REACTIVE PROTEIN: CRP: 9.3 mg/dL — ABNORMAL HIGH (ref <1.0)

## 2018-07-20 LAB — FERRITIN: Ferritin: 84 ng/mL (ref 24–336)

## 2018-07-20 LAB — MAGNESIUM: Magnesium: 2 mg/dL (ref 1.7–2.4)

## 2018-07-20 LAB — LACTATE DEHYDROGENASE: LDH: 255 U/L — ABNORMAL HIGH (ref 98–192)

## 2018-07-20 LAB — D-DIMER, QUANTITATIVE: D-Dimer, Quant: 0.59 ug/mL-FEU — ABNORMAL HIGH (ref 0.00–0.50)

## 2018-07-20 MED ORDER — LORAZEPAM 2 MG/ML IJ SOLN
1.0000 mg | Freq: Once | INTRAMUSCULAR | Status: AC
  Administered 2018-07-20: 1 mg via INTRAVENOUS
  Filled 2018-07-20: qty 1

## 2018-07-20 MED ORDER — BISOPROLOL FUMARATE 5 MG PO TABS
2.5000 mg | ORAL_TABLET | Freq: Every morning | ORAL | Status: DC
  Administered 2018-07-20 – 2018-07-21 (×2): 2.5 mg via ORAL
  Filled 2018-07-20 (×2): qty 1

## 2018-07-20 NOTE — Progress Notes (Signed)
  Date: 07/20/2018  Patient name: Nicholas [name]  Medical record number: [id number]  Date of birth: [birth year]    Subjective: Reports feeling fine, anxious about MRI.   Updated grandson by phone.  Objective:  Vital signs in last 24 hours: Vitals:   07/19/18 0746 07/19/18 2018 07/20/18 0500 07/20/18 0756  BP: 108/75 117/62 134/72 117/69  Pulse: 99 (!) 109 97 (!) 118  Resp:  16 18 16   Temp: 98.2 F (36.8 C) 99.3 F (37.4 C) 97.6 F (36.4 C) 97.6 F (36.4 C)  TempSrc: Oral Oral Oral Oral  SpO2: 91% 93% 94% 95%  Weight:   94.8 kg   Height:        Physical Exam: General: lying in bed, comfortable HEENT: normal sclera, moist mucous membranes Cardiac: irregularly irregular, brisk pulses and warm extremities, no edema Pulmonary: lungs difficult to hear with room noise and disposable stethoscope, sound diminished in RLL, normal respiratory effort Abdomen: soft, non-tender, no masses, normal bowel sounds Extremities: No synovitis or joint effusions Skin: laceration on right elbow covered with dressing, small laceration and bruise on right knee Neuro: Alert, normal speech, PERRL, EOMI, smile symmetric, facial sensation intact and equal, tongue protrudes midline, shoulder shrug symmetric. Strength 5/5 throughout, sensation equal and intact to light touch.  Significant new test results: Creatinine 1.8-->1.6 LDL 62, TG 61 A1C 5.9% LDH 201-->255 Ferritin 56-->84 CRP 7.7-->9.3 DDimer 0.6-->0.6 Fibrinogen 527-->531 IL-6 155  Assessment/Plan:  Principal Problem:   TIA (transient ischemic attack) Active Problems:   CHF (congestive heart failure) (HCC)   HTN (hypertension)   Renal insufficiency   COVID-19 virus infection   Pneumonia due to COVID-19 virus   Nicholas [name] is an59 y.o man with HTN, asthma,CKD stage III, and TIAwho presented with aphasiaand confusion after a fallthis morning, now seemingly back to baseline.Also has COVID-19  TIA High concern for TIA with  fall possibly representing weakness or imbalance and aphasia, but now seemingly back to baseline.CT headnegative. Arrived as a code stroke,Neurology following, appreciate consultation.  -Will attempt MRI again with pretreatment with ativan per neuro recommendation -Continue Plavix 75 mg daily for now, may transition to anticoagulation -LDL <70, plan to continue fish oil for now -Allow permissive hypertension up to 220/120 -PT/OT/SLP eval -Hemoglobin A1c 5.9%, may consider metformin if renal function allows -TTE -Continuous cardiac monitoring -Airborne and contact precautions  New Afib with RVR Diagnosed this admission, may have been leading to his recurrent TIAs  -Will need anticoagulation when safe from a stroke/TIA perspective, CHADSVASC >2 -Will restart his home bisoprolol and watch HR, may need additional BB  COVID-19 No symptoms on presentation, now mildly hypoxic. Inflammatory labs moderately elevated.  -CXR with mild bilateral hazy opacities and small round opacity in R lung base -O2 to maintain sat >92% -monitor fever curve -continue remdesivir and dexamethasone -will need followup CXR and CT if round opacity persists at R lung base  Hypertension Has ranged90-120/80s.Patient takes bisoprolol 2.5 mg daily, amlodipine 10 mg  -Restart bisoprolol as above -Continue holding amlodipine  CKD3 Creatinine 1.8, baseline ranging 1.7-2.3.   -Will continue to monitor   Dispo: Continue inpatient management to complete TIA workup, treat new Afib with RVR, and for hypoxia likely from COVID-19.   I personally spent >35 minutes with Nicholas [name] and discussing his plan with the care team.  Lenice Pressman, M.D., Ph.D. 07/20/2018, 12:57 PM

## 2018-07-20 NOTE — Progress Notes (Signed)
Patient is confused after receiving ativan for MRI procedure on day shift. Very impulsive and pulling off cardiac leads and 02 probe. No acute distress noted. Will report to MD and continue to monitor.

## 2018-07-20 NOTE — Progress Notes (Signed)
STROKE TEAM PROGRESS NOTE   INTERVAL HISTORY Tried to have MRI completed is claustrophic. Spoke to his nurse, will call today, haldol ordered yesetrday but will try ativan low dose instead. Discussed with nursing.   Vitals:   07/19/18 0746 07/19/18 2018 07/20/18 0500 07/20/18 0756  BP: 108/75 117/62 134/72 117/69  Pulse: 99 (!) 109 97 (!) 118  Resp:  16 18 16   Temp: 98.2 F (36.8 C) 99.3 F (37.4 C) 97.6 F (36.4 C) 97.6 F (36.4 C)  TempSrc: Oral Oral Oral Oral  SpO2: 91% 93% 94% 95%  Weight:   94.8 kg   Height:        CBC:  Recent Labs  Lab 07/18/18 1002  07/19/18 0529 07/20/18 0739  WBC 5.5  --  5.2 4.0  NEUTROABS 3.6  --   --  1.9  HGB 12.6*   < > 12.1* 13.0  HCT 39.3   < > 37.8* 41.1  MCV 86.2  --  85.5 85.8  PLT 173  --  191 222   < > = values in this interval not displayed.    Basic Metabolic Panel:  Recent Labs  Lab 07/18/18 1012 07/19/18 0529 07/20/18 0739  NA  --  142 141  K  --  3.7 4.2  CL  --  110 109  CO2  --  21* 19*  GLUCOSE  --  79 130*  BUN  --  28* 36*  CREATININE  --  1.82* 1.60*  CALCIUM  --  8.2* 8.5*  MG 1.8  --   --    Lipid Panel:     Component Value Date/Time   CHOL 111 07/20/2018 0623   CHOL 149 12/10/2017 1157   TRIG 61 07/20/2018 0623   HDL 37 (L) 07/20/2018 0623   HDL 40 12/10/2017 1157   CHOLHDL 3.0 07/20/2018 0623   VLDL 12 07/20/2018 0623   LDLCALC 62 07/20/2018 0623   LDLCALC 73 12/10/2017 1157   HgbA1c:  Lab Results  Component Value Date   HGBA1C 5.9 (H) 07/19/2018   Urine Drug Screen: No results found for: LABOPIA, COCAINSCRNUR, LABBENZ, AMPHETMU, THCU, LABBARB  Alcohol Level No results found for: ETH  IMAGING  Dg Elbow Complete Right 07/18/2018 IMPRESSION:  No acute abnormality.   Ct Head Wo Contrast 07/18/2018 IMPRESSION:  1. No acute intracranial pathology.   Ct Head Wo Contrast 07/15/2018 IMPRESSION:  1.  No acute intracranial pathology.  2. 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    Mr Brain Wo Contrast 07/16/2018 IMPRESSION:  1. No acute intracranial abnormality or significant interval change. No focal etiology to explain ataxia.  2. Stable atrophy and white matter disease.  3. Mega cisterna magna again noted.  4. Acute sinusitis.   Dg Chest Port 1 View  07/18/2018 IMPRESSION:  1. Cardiomegaly with mild vascular congestion.  2. Rounded airspace opacity at the right lung base, not well appreciated on prior exam. Attention on follow-up examinations is recommended.   Dg Chest Port 1 View 07/16/2018 IMPRESSION:  1. Mild pulmonary vascular congestion and low lung volumes.  2. Aortic atherosclerosis.   Dg Hip Unilat W Or Wo Pelvis 2-3 Views Left 07/18/2018 IMPRESSION:  No acute abnormality.    Transthoracic Echocardiogram  07/20/2018 IMPRESSIONS  1. The left ventricle has normal systolic function, with an ejection fraction of 55-60%. The cavity size was normal. Left ventricular diastolic Doppler parameters are indeterminate.  2. The mitral valve is abnormal. Mild thickening of  the mitral valve leaflet. There is mild mitral annular calcification present.  3. The aortic valve was not well visualized. No stenosis of the aortic valve.  4. The interatrial septum was not assessed.    PHYSICAL EXAM Frail malnourished looking elderly Caucasian male not in distress. . Afebrile. Head is nontraumatic. Neck is supple without bruit.    Cardiac exam no murmur or gallop. Lungs are clear to auscultation. Distal pulses are well felt.  Neurological exam Awake alert oriented to person, place, fully oriented. Decreased hearing.  He has nonfluent speech with some minimal word finding, slowed speech,  difficulties but can speak short sentences.  He follows simple midline in 1 and a few two-step commands.  Extraocular movements are full range without nystagmus.  He blinks to threat bilaterally.  Face is symmetric without weakness.  Tongue is midline.  Motor system exam  reveals symmetric upper and lower extremity strength no focal weakness.  Sensation appears intact bilaterally. Able to walk to the bathroom independently with walker.   ASSESSMENT/PLAN [name] is a 85 y.o. male with history of hypertension, glaucoma, asthma, chronic kidney disease stage III with recent hospitalization and discharged 2 days prior complaining of intermittent headache presenting with expressive aphasia and fall.   Probable TIA due to new onset A. fib versus mild concussion from fall  CT head no acute abnormality  MRI - No acute intracranial abnormality or significant interval change. No focal etiology to explain ataxia.  MRA H&N - pending  Carotid Doppler not ordered   2D Echo  - EF 60 - 65%. No cardiac source of emboli identified.   LDL - 62  HgbA1c - 5.9  Heparin 5000 units sq tid for VTE prophylaxis  clopidogrel 75 mg daily prior to admission, now on clopidogrel 75 mg daily.  Consider switching to Eliquis given atrial fibrillation and high chads 2 Vascor  Therapy recommendations:  HH PT and OT  Disposition:  pending   Atrial Fibrillation, new onset  Home anticoagulation:  none   Anticoagulation recommended from stroke standpoint if A. fib confirmed  Continue plavix for now till MRI is completed.    COVID-19   Asymptomatic   swab positive in ED  Hypertension   Stable . BP goal normotensive from stroke standpoint given TIA  Hyperlipidemia  Home meds: Fish oil  LDL 62, goal < 70  Add statin if LDL greater than or equal to 70  Other Stroke Risk Factors  Advanced age  Former cigarette smoker   Overweight, Body mass index is 28.35 kg/m., recommend weight loss, diet and exercise as appropriate   Other Active Problems  CKD stage III - Cr - 1.60  Behavioral disturbance requiring Haldol  Acute sinusitis by MRI  Rounded airspace opacity at the right lung base, not well appreciated on prior exam. Attention on follow-up  examinations is recommended.   Hospital day # 2  Personally examined patient and images, and have participated in and made any corrections needed to history, physical, neuro exam,assessment and plan as stated above.  I have personally obtained the history, evaluated lab date, reviewed imaging studies and agree with radiology interpretations.    Sarina Ill, MD Stroke Neurology   A total of 25 minutes was spent for the care of this patient, spent on counseling patient and family on different diagnostic and therapeutic options, counseling and coordination of care, riskd ans benefits of management, compliance, or risk factor reduction and education.   To contact Stroke Continuity provider,  please refer to [email or web address] After hours, contact General Neurology

## 2018-07-21 DIAGNOSIS — N289 Disorder of kidney and ureter, unspecified: Secondary | ICD-10-CM

## 2018-07-21 DIAGNOSIS — I10 Essential (primary) hypertension: Secondary | ICD-10-CM

## 2018-07-21 DIAGNOSIS — J1289 Other viral pneumonia: Secondary | ICD-10-CM

## 2018-07-21 LAB — D-DIMER, QUANTITATIVE: D-Dimer, Quant: 0.61 ug/mL-FEU — ABNORMAL HIGH (ref 0.00–0.50)

## 2018-07-21 LAB — CBC WITH DIFFERENTIAL/PLATELET
Abs Immature Granulocytes: 0.07 10*3/uL (ref 0.00–0.07)
Basophils Absolute: 0 10*3/uL (ref 0.0–0.1)
Basophils Relative: 0 %
Eosinophils Absolute: 0 10*3/uL (ref 0.0–0.5)
Eosinophils Relative: 0 %
HCT: 39.8 % (ref 39.0–52.0)
Hemoglobin: 12.7 g/dL — ABNORMAL LOW (ref 13.0–17.0)
Immature Granulocytes: 1 %
Lymphocytes Relative: 39 %
Lymphs Abs: 2.4 10*3/uL (ref 0.7–4.0)
MCH: 27.2 pg (ref 26.0–34.0)
MCHC: 31.9 g/dL (ref 30.0–36.0)
MCV: 85.2 fL (ref 80.0–100.0)
Monocytes Absolute: 0.7 10*3/uL (ref 0.1–1.0)
Monocytes Relative: 12 %
Neutro Abs: 3.1 10*3/uL (ref 1.7–7.7)
Neutrophils Relative %: 48 %
Platelets: 241 10*3/uL (ref 150–400)
RBC: 4.67 MIL/uL (ref 4.22–5.81)
RDW: 13.9 % (ref 11.5–15.5)
WBC: 6.3 10*3/uL (ref 4.0–10.5)
nRBC: 0 % (ref 0.0–0.2)

## 2018-07-21 LAB — FERRITIN: Ferritin: 89 ng/mL (ref 24–336)

## 2018-07-21 LAB — COMPREHENSIVE METABOLIC PANEL
ALT: 32 U/L (ref 0–44)
AST: 41 U/L (ref 15–41)
Albumin: 2.9 g/dL — ABNORMAL LOW (ref 3.5–5.0)
Alkaline Phosphatase: 69 U/L (ref 38–126)
Anion gap: 11 (ref 5–15)
BUN: 45 mg/dL — ABNORMAL HIGH (ref 8–23)
CO2: 20 mmol/L — ABNORMAL LOW (ref 22–32)
Calcium: 8.7 mg/dL — ABNORMAL LOW (ref 8.9–10.3)
Chloride: 110 mmol/L (ref 98–111)
Creatinine, Ser: 1.75 mg/dL — ABNORMAL HIGH (ref 0.61–1.24)
GFR calc Af Amer: 41 mL/min — ABNORMAL LOW (ref >60)
GFR calc non Af Amer: 35 mL/min — ABNORMAL LOW (ref >60)
Glucose, Bld: 115 mg/dL — ABNORMAL HIGH (ref 70–99)
Potassium: 4.2 mmol/L (ref 3.5–5.1)
Sodium: 141 mmol/L (ref 135–145)
Total Bilirubin: 0.8 mg/dL (ref 0.3–1.2)
Total Protein: 5.4 g/dL — ABNORMAL LOW (ref 6.5–8.1)

## 2018-07-21 LAB — C-REACTIVE PROTEIN: CRP: 4.8 mg/dL — ABNORMAL HIGH (ref <1.0)

## 2018-07-21 LAB — FIBRINOGEN: Fibrinogen: 444 mg/dL (ref 210–475)

## 2018-07-21 MED ORDER — APIXABAN 2.5 MG PO TABS: 2.5000 mg | ORAL_TABLET | Freq: Two times a day (BID) | ORAL | 0 refills | Status: DC

## 2018-07-21 MED ORDER — APIXABAN 2.5 MG PO TABS
2.5000 mg | ORAL_TABLET | Freq: Two times a day (BID) | ORAL | Status: DC
  Administered 2018-07-21: 2.5 mg via ORAL
  Filled 2018-07-21: qty 1

## 2018-07-21 MED ORDER — APIXABAN 2.5 MG PO TABS: 2.5000 mg | ORAL_TABLET | Freq: Two times a day (BID) | ORAL | Status: DC

## 2018-07-21 MED ORDER — HALOPERIDOL LACTATE 5 MG/ML IJ SOLN: 1.0000 mg | Freq: Once | INTRAMUSCULAR | Status: AC | PRN

## 2018-07-21 MED ORDER — HALOPERIDOL 0.5 MG PO TABS
1.0000 mg | ORAL_TABLET | Freq: Once | ORAL | Status: AC | PRN
  Administered 2018-07-21: 1 mg via ORAL
  Filled 2018-07-21: qty 2

## 2018-07-21 NOTE — Progress Notes (Signed)
Physical Therapy Treatment Patient Details Name: Nicholas [name] MRN: [id number] DOB: Oct 29, 1933 Today's Date: 07/21/2018    History of Present Illness Pt is an 85 yo male s/p TIA symptoms with AMS and slurred speech s/p fall. CT head negative and pt claustrophobic so unable to obtain MRI. Pmhx: HTN, asthma, KD stage III, H?O TIAs, glaucoma, HLD.    PT Comments    Patient doing well with therapy toady, ambulating short distances in room, VSS on RA. Now ambulating with supervision and no AD. Safer with RW at this time. Cont to rec HHPT    Follow Up Recommendations  Home health PT;Supervision for mobility/OOB     Equipment Recommendations  None recommended by PT    Recommendations for Other Services       Precautions / Restrictions Precautions Precautions: Fall;Other (comment) Precaution Comments: watch O2 Restrictions Weight Bearing Restrictions: No    Mobility  Bed Mobility Overal bed mobility: Modified Independent Bed Mobility: Sidelying to Sit;Sit to Supine   Sidelying to sit: Modified independent (Device/Increase time);HOB elevated   Sit to supine: Modified independent (Device/Increase time)   General bed mobility comments: use of rail  Transfers Overall transfer level: Needs assistance Equipment used: None Transfers: Sit to/from Stand Sit to Stand: Min guard         General transfer comment: Assist for safety.  Ambulation/Gait Ambulation/Gait assistance: Min guard Gait Distance (Feet): 40 Feet Assistive device: None Gait Pattern/deviations: Step-through pattern;Decreased stride length;Drifts right/left Gait velocity: decr   General Gait Details: improved indepdnence with gait, VSS on RA   Stairs             Wheelchair Mobility    Modified Rankin (Stroke Patients Only) Modified Rankin (Stroke Patients Only) Pre-Morbid Rankin Score: No significant disability Modified Rankin: Moderate disability     Balance Overall balance assessment:  Needs assistance Sitting-balance support: No upper extremity supported;Feet supported Sitting balance-Leahy Scale: Good     Standing balance support: No upper extremity supported;During functional activity Standing balance-Leahy Scale: Fair Standing balance comment: Assist for dynamic activities                            Cognition Arousal/Alertness: Awake/alert Behavior During Therapy: WFL for tasks assessed/performed Overall Cognitive Status: Within Functional Limits for tasks assessed                                        Exercises      General Comments        Pertinent Vitals/Pain      Home Living                      Prior Function            PT Goals (current goals can now be found in the care plan section) Acute Rehab PT Goals Patient Stated Goal: to go home PT Goal Formulation: With patient Time For Goal Achievement: 07/26/18 Potential to Achieve Goals: Good    Frequency    Min 3X/week      PT Plan      Co-evaluation              AM-PAC PT "6 Clicks" Mobility   Outcome Measure  Help needed turning from your back to your side while in a flat bed without using bedrails?: None Help needed moving  from lying on your back to sitting on the side of a flat bed without using bedrails?: None Help needed moving to and from a bed to a chair (including a wheelchair)?: A Little Help needed standing up from a chair using your arms (e.g., wheelchair or bedside chair)?: A Little Help needed to walk in hospital room?: A Little Help needed climbing 3-5 steps with a railing? : A Little 6 Click Score: 20    End of Session Equipment Utilized During Treatment: Oxygen Activity Tolerance: Patient tolerated treatment well Patient left: in bed;with call bell/phone within reach;with bed alarm set Nurse Communication: Mobility status;Other (comment)(pulse ox not functioning) PT Visit Diagnosis: Unsteadiness on feet (R26.81)      Time: 1525-1540 PT Time Calculation (min) (ACUTE ONLY): 15 min  Charges:  $Gait Training: 8-22 mins                    Reinaldo Berber, PT, DPT Acute Rehabilitation Services Pager: [phone] Office: [phone]    Reinaldo Berber 07/21/2018, 3:38 PM

## 2018-07-21 NOTE — Progress Notes (Addendum)
STROKE TEAM PROGRESS NOTE   INTERVAL HISTORY MRA head and neck completed, unremarkable, no LVO. Pt overnight had delirium and currently has bedside sitting. He had hard of hearing, on top of our talking with him over the N95 mask, he was able to follow most of the commands but not all of them.   Vitals:   07/20/18 2039 07/20/18 2047 07/21/18 0457 07/21/18 0503  BP: 115/67  136/87   Pulse:   (!) 55   Resp: 18     Temp: 97.7 F (36.5 C)     TempSrc: Oral  Oral   SpO2: 91% 93% 92%   Weight:    81 kg  Height:        CBC:  Recent Labs  Lab 07/20/18 0739 07/21/18 0722  WBC 4.0 6.3  NEUTROABS 1.9 3.1  HGB 13.0 12.7*  HCT 41.1 39.8  MCV 85.8 85.2  PLT 222 174    Basic Metabolic Panel:  Recent Labs  Lab 07/18/18 1012  07/20/18 0739 07/20/18 0759 07/21/18 0722  NA  --    < > 141  --  141  K  --    < > 4.2  --  4.2  CL  --    < > 109  --  110  CO2  --    < > 19*  --  20*  GLUCOSE  --    < > 130*  --  115*  BUN  --    < > 36*  --  45*  CREATININE  --    < > 1.60*  --  1.75*  CALCIUM  --    < > 8.5*  --  8.7*  MG 1.8  --   --  2.0  --    < > = values in this interval not displayed.   Lipid Panel:     Component Value Date/Time   CHOL 111 07/20/2018 0623   CHOL 149 12/10/2017 1157   TRIG 61 07/20/2018 0623   HDL 37 (L) 07/20/2018 0623   HDL 40 12/10/2017 1157   CHOLHDL 3.0 07/20/2018 0623   VLDL 12 07/20/2018 0623   LDLCALC 62 07/20/2018 0623   LDLCALC 73 12/10/2017 1157   HgbA1c:  Lab Results  Component Value Date   HGBA1C 5.9 (H) 07/19/2018   Urine Drug Screen: No results found for: LABOPIA, COCAINSCRNUR, LABBENZ, AMPHETMU, THCU, LABBARB  Alcohol Level No results found for: ETH  IMAGING  Dg Elbow Complete Right 07/18/2018 IMPRESSION:  No acute abnormality.   Ct Head Wo Contrast 07/18/2018 IMPRESSION:  1. No acute intracranial pathology.   Ct Head Wo Contrast 07/15/2018 IMPRESSION:  1.  No acute intracranial pathology.  2. Redemonstrated mega cisterna  magna variant of the posterior fossa.    Mr Brain Wo Contrast 07/16/2018 IMPRESSION:  1. No acute intracranial abnormality or significant interval change. No focal etiology to explain ataxia.  2. Stable atrophy and white matter disease.  3. Mega cisterna magna again noted.  4. Acute sinusitis.   Dg Chest Port 1 View  07/18/2018 IMPRESSION:  1. Cardiomegaly with mild vascular congestion.  2. Rounded airspace opacity at the right lung base, not well appreciated on prior exam. Attention on follow-up examinations is recommended.   Dg Chest Port 1 View 07/16/2018 IMPRESSION:  1. Mild pulmonary vascular congestion and low lung volumes.  2. Aortic atherosclerosis.   Dg Hip Unilat W Or Wo Pelvis 2-3 Views Left 07/18/2018 IMPRESSION:  No acute abnormality.  Transthoracic Echocardiogram  07/20/2018 IMPRESSIONS  1. The left ventricle has normal systolic function, with an ejection fraction of 55-60%. The cavity size was normal. Left ventricular diastolic Doppler parameters are indeterminate.  2. The mitral valve is abnormal. Mild thickening of the mitral valve leaflet. There is mild mitral annular calcification present.  3. The aortic valve was not well visualized. No stenosis of the aortic valve.  4. The interatrial septum was not assessed.    PHYSICAL EXAM  Temp:  [97.6 F (36.4 C)-97.8 F (36.6 C)] 97.8 F (36.6 C) (07/12 0900) Pulse Rate:  [55-82] 82 (07/12 0900) Resp:  [16-19] 19 (07/12 0900) BP: (115-136)/(67-95) 136/95 (07/12 0900) SpO2:  [91 %-95 %] 95 % (07/12 0900) Weight:  [81 kg] 81 kg (07/12 0503)  General - Well nourished, well developed, in no apparent distress.  Ophthalmologic - fundi not visualized due to noncooperation.  Cardiovascular - Regular rate and rhythm, not in afib.  Mental Status -  Level of arousal and orientation to place, age, self and person were intact, but not orientated to time. Language exam showed following most of the simple commands but  not all of them due to hard of hearing and our taking with him over N95 mask. Spontaneous speech but mild dysarthria, naming 3/3, able to repeat 3 word sentences but not longer sentences could be due to hard of hearing  Cranial Nerves II - XII - II - Visual field intact OU. III, IV, VI - Extraocular movements intact. V - Facial sensation intact bilaterally. VII - Facial movement intact bilaterally. VIII - Hearing & vestibular intact bilaterally. X - Palate elevates symmetrically. XI - Chin turning & shoulder shrug intact bilaterally. XII - Tongue protrusion intact.  Motor Strength - The patient's strength was normal in all extremities and pronator drift was absent.  Bulk was normal and fasciculations were absent.   Motor Tone - Muscle tone was assessed at the neck and appendages and was normal.  Reflexes - The patient's reflexes were symmetrical in all extremities and he had no pathological reflexes.  Sensory - Light touch, temperature/pinprick were assessed and were symmetrical.    Coordination - The patient had normal movements in the hands with no ataxia or dysmetria.  Tremor was absent.  Gait and Station - deferred.    ASSESSMENT/PLAN [name] is a 85 y.o. male with history of hypertension, glaucoma, asthma, chronic kidney disease stage III with recent hospitalization and discharged 2 days prior complaining of intermittent headache presenting with expressive aphasia and fall.   Probable TIA due to new onset A. fib versus mild concussion from fall  CT head no acute abnormality  MRI - No acute intracranial abnormality or significant interval change. No focal etiology to explain ataxia.  MRA H&N - unremarkable  2D Echo  - EF 60 - 65%. No cardiac source of emboli identified.   LDL - 62  HgbA1c - 5.9  Heparin 5000 units sq tid for VTE prophylaxis  clopidogrel 75 mg daily prior to admission, now on eliquis 2.5mg  bid. Continue eliquis on discharge for stroke  prevention  Therapy recommendations:  HH PT and OT  Disposition:  pending   Atrial Fibrillation, new onset, paroxysmal   Home anticoagulation:  none   On eliquis for stroke prevetion  Rate controlled   COVID-19   Asymptomatic   swab positive in ED  Overnight mild desating, needed O2  Likely transfer to [facility] before d/c home  Hypertension   Stable . BP  goal normotensive now  Hyperlipidemia  Home meds: Fish oil  LDL 62, goal < 70  Continue fish oil on discharge  Other Stroke Risk Factors  Advanced age  Former cigarette smoker   Other Active Problems  CKD stage III - Cr - 1.60->1.75  Behavioral disturbance requiring Haldol - likely hospital delirim  Hospital day # 3  Neurology will sign off. Please call with questions. Pt will follow up with stroke clinic NP at [facility] in about 4 weeks. Thanks for the consult.  Nicholas Hawking, MD PhD Stroke Neurology 07/21/2018 12:27 PM  To contact Stroke Continuity provider, please refer to [email or web address] After hours, contact General Neurology

## 2018-07-21 NOTE — Progress Notes (Signed)
Mr. Euceda remains a high fall risk, he is very unsteady on his feet.  He continues to be restless, getting up out of bed and removing monitoring equipment.  It is difficult for nursing staff to get to him quickly as ppe donning and doffing required due to COVID-19 status.  We will order a sitter to redirect him.  Prefer redirection if at all possible.  It this is unsuccessful we will implement further pharmacologic management with Haldol to maintain patient safety.

## 2018-07-21 NOTE — Progress Notes (Signed)
2230 IMTS paged, regarding pt's agitation I.e pulling off leads to monitor, disconnecting IV, pt redirected several times by staff members, and monitor reconnected. Hand mitts, placed on pt's hands, pt attempted to get oob unassisted and bed alarm went off, when staff went in to assist pt, he stated "I can't sleep with these things on my hands." Subsequently, MD returned call and writer explained everything to him. N.O. received for Ativan 1mg , Mitts removed upon administering IV Ativan 1mg , writer explained to patient that it is important that he keep his monitor and and IV attached. He nodded in agreement and laid down. Bed alarm reactivated and call bell within reach. Writer informed pt that she would check on him shortly. At this time pt seems comfortable, will continue to monitor.

## 2018-07-21 NOTE — Progress Notes (Signed)
Patient is  still agitated and has pulled all his leads and 02 tubing and trying to walk around not following command.  Ativan did not work. Paged  the on call provider and awaiting  a call back for a sitter.

## 2018-07-21 NOTE — Progress Notes (Signed)
[facility] for Apixaban Indication: atrial fibrillation  Allergies  Allergen Reactions  . Penicillins Hives    Childhood allergy Has patient had a PCN reaction causing immediate rash, facial/tongue/throat swelling, SOB or lightheadedness with hypotension: Yes Has patient had a PCN reaction causing severe rash involving mucus membranes or skin necrosis: Yes Has patient had a PCN reaction that required hospitalization: Yes Has patient had a PCN reaction occurring within the last 10 years: No If all of the above answers are "NO", then may proceed with Cephalosporin use.     Patient Measurements: Height: 6' (182.9 cm) Weight: 178 lb 8 oz (81 kg) IBW/kg (Calculated) : 77.6  Vital Signs: Temp Source: Oral (07/12 0457) BP: 136/87 (07/12 0457) Pulse Rate: 55 (07/12 0457)  Labs: Recent Labs    07/18/18 1002  07/18/18 1012 07/18/18 1213 07/19/18 0529 07/20/18 0739 07/21/18 0722  HGB 12.6*   < >  --   --  12.1* 13.0 12.7*  HCT 39.3   < >  --   --  37.8* 41.1 39.8  PLT 173  --   --   --  191 222 241  APTT 33  --   --   --   --   --   --   LABPROT 14.1  --   --   --   --   --   --   INR 1.1  --   --   --   --   --   --   CREATININE 1.94*   < >  --   --  1.82* 1.60* 1.75*  CKTOTAL  --   --  170  --   --   --   --   TROPONINIHS  --   --  30* 26*  --   --   --    < > = values in this interval not displayed.   Estimated Creatinine Clearance: 35.7 mL/min (A) (by C-G formula based on SCr of 1.75 mg/dL (H)).  Medical History: Past Medical History:  Diagnosis Date  . Anemia    none recent  . Arthritis   . Asthma   . Chronic kidney disease    ckd stage 3  . Family history of adverse reaction to anesthesia    daughter has ponv   . Glaucoma   . Hypertension    Medications:  Scheduled:  . apixaban  2.5 mg Oral BID  . bisoprolol  2.5 mg Oral q morning - 10a  . dexamethasone  6 mg Oral Daily    Assessment: 84 yoM Covid +, new onset Afib,  concern for TIA. Begin Apixaban, Plavix d/c Pt > 80 yr, SCr > 1.5, will dose Apixaban 2.5mg  bid  PMH: hx TIA, CHF, HTN, CKD3, Asthma  Goal of Therapy:  Monitor platelets by anticoagulation protocol: Yes   Plan:  Apixaban 2.5mg  bid Monitor for s/s bleed, CBC  Minda Ditto PharmD [phone] 07/21/2018,9:34 AM

## 2018-07-21 NOTE — Progress Notes (Signed)
New order from Dr. Shan Levans for a Child psychotherapist.Pateitn refused to let this RN apply the cardiac monitoring on . Dr. Shan Levans is aware. Will continue to monitor.

## 2018-07-21 NOTE — Progress Notes (Signed)
Date: 07/21/2018  Patient name: Nicholas [name]  Medical record number: [id number]  Date of birth: May 25, 1933    Subjective:  1. Able to get MRI yesterday with ativan pretreatment 2.  Quite agitated overnight, required redirection multiple times, additional doses of Ativan and Haldol, eventually required a sitter in the room 3.  Continues with sitter this morning, but has been much calmer, sleeping some during the day. 4.  No complaints this morning, feels fine 5.  No shortness of breath, no difficulty talking, feels steady on his feet  Objective:  Vital signs in last 24 hours: Vitals:   07/21/18 0457 07/21/18 0503 07/21/18 0900 07/21/18 1400  BP: 136/87  (!) 136/95   Pulse: (!) 55  82 70  Resp:   19   Temp:   97.8 F (36.6 C)   TempSrc: Oral  Oral   SpO2: 92%  95% 92%  Weight:  81 kg    Height:        Physical Exam: General: lying inbed, comfortable HEENT: normal sclera,moistmucous membranes Cardiac:irregularly irregular, brisk pulses and warm extremities, no edema Pulmonary:lungs difficult to hear with room noise and disposable stethoscope, but sounds clear bilaterally with normal respiratory effort Abdomen: soft, non-tender, no masses, normal bowel sounds Extremities: No synovitis or joint effusions Skin: laceration on right elbowcovered with dressing, small laceration and bruise on right knee Neuro: Alert, normal speech, PERRL, EOMI, smile symmetric, facial sensation intact and equal, tongue protrudes midline, shoulder shrug symmetric. Strength 5/5 throughout, sensation equal and intact to light touch.  Significant new test results: Creatinine 1.8-->1.6-->1.8 LDL 62, TG 61 A1C 5.9% Ferritin 56-->84-->89 CRP 7.7-->9.3-->4.8 DDimer 0.6-->0.6-->0.6 Fibrinogen 527-->531-->44 IL-6 155  Assessment/Plan:  Principal Problem:   TIA (transient ischemic attack) Active Problems:   CHF (congestive heart failure) (HCC)   HTN (hypertension)   Renal insufficiency  COVID-19 virus infection   Pneumonia due to COVID-19 virus   Mr. Molyneux is an70 y.omanwith HTN, asthma,CKD stage III, and TIAwho presented with aphasiaand confusion after a fall, now seemingly back to baseline, although suffered from delirium overnight.Also has COVID-19 with mild hypoxia.  TIA High concern for TIA with fall possibly representing weakness or imbalance and aphasia, but now seemingly back to baseline with the exception of delirium overnight after ativan.  -MRI without acute findings or significant LVO -Given Afib, transition clopidogrel to apixiban -LDL <70, plan to continue fish oil for now -PT/OT/SLP eval, recommending HH -Hemoglobin A1c 5.9%, may consider metformin if renal function allows in the future -TTE unremarkable -Continuous cardiac monitoring  New Afib Diagnosed this admission, may have been leading to his recurrent TIAs, rate now controlled  -CHADSVASC >2, starting apixiban today -Doing well on home bisoprolol  COVID-19 No symptoms on presentation, now mildly hypoxic. Inflammatory labs moderately elevated but slightly improved  -CXR at admission with mild bilateral hazy opacities and small round opacity in R lung base -O2 to maintain sat >92%, weaned to RA today -monitor fever curve -continue remdesivir and dexamethasone -Airborne and contact precautions -If able to stay off O2 today, will discharge home -will need followup CXR and CT if round opacity persists at R lung base  Hypertension Has ranged90-120/80s.Patient takes bisoprolol 2.5 mg daily, amlodipine 10 mg  -Restart bisoprolol as above -Continue holding amlodipine  CKD3 Creatinine 1.8, baseline ranging 1.7-2.3.   -Will continue to monitor   Dispo:If able to stay off O2, possible discharge today, if not, will transfer to [facility] for continued COVID-19 management  Lenice Pressman, M.D.,  Ph.D. 07/21/2018, 3:47 PM

## 2018-07-21 NOTE — Progress Notes (Signed)
New order for Haldol 1 Mg IV  X 1 dose received from Dr. Shan Levans for agitation

## 2018-07-21 NOTE — Discharge Instructions (Addendum)
Prevent the Spread of COVID-19 if You Are Sick If you are sick with COVID-19 or think you might have COVID-19, follow the steps below to help protect other people in your home and community. Stay home except to get medical care.  Stay home. Most people with COVID-19 have mild illness and are able to recover at home without medical care. Do not leave your home, except to get medical care. Do not visit public areas.  Take care of yourself. Get rest and stay hydrated.  Get medical care when needed. Call your doctor before you go to their office for care. But, if you have trouble breathing or other concerning symptoms, call 911 for immediate help.  Avoid public transportation, ride-sharing, or taxis. Separate yourself from other people and pets in your home.  As much as possible, stay in a specific room and away from other people and pets in your home. Also, you should use a separate bathroom, if available. If you need to be around other people or animals in or outside of the home, wear a cloth face covering. ? See COVID-19 and Animals if you have questions about pets: https://www.thomas.biz/ Monitor your symptoms.  Common symptoms of COVID-19 include fever and cough. Trouble breathing is a more serious symptom that means you should get medical attention.  Follow care instructions from your healthcare provider and local health department. Your local health authorities will give instructions on checking your symptoms and reporting information. If you develop emergency warning signs for COVID-19 get medical attention immediately.  Emergency warning signs include*:  Trouble breathing  Persistent pain or pressure in the chest  New confusion or not able to be woken  Bluish lips or face *This list is not all inclusive. Please consult your medical provider for any other symptoms that are severe or concerning to you. Call 911 if you have a medical  emergency. If you have a medical emergency and need to call 911, notify the operator that you have or think you might have, COVID-19. If possible, put on a facemask before medical help arrives. Call ahead before visiting your doctor.  Call ahead. Many medical visits for routine care are being postponed or done by phone or telemedicine.  If you have a medical appointment that cannot be postponed, call your doctor's office. This will help the office protect themselves and other patients. If you are sick, wear a cloth covering over your nose and mouth.  You should wear a cloth face covering over your nose and mouth if you must be around other people or animals, including pets (even at home).  You don't need to wear the cloth face covering if you are alone. If you can't put on a cloth face covering (because of trouble breathing for example), cover your coughs and sneezes in some other way. Try to stay at least 6 feet away from other people. This will help protect the people around you. Note: During the COVID-19 pandemic, medical grade facemasks are reserved for healthcare workers and some first responders. You may need to make a cloth face covering using a scarf or bandana. Cover your coughs and sneezes.  Cover your mouth and nose with a tissue when you cough or sneeze.  Throw used tissues in a lined trash can.  Immediately wash your hands with soap and water for at least 20 seconds. If soap and water are not available, clean your hands with an alcohol-based hand sanitizer that contains at least 60% alcohol. Clean your hands often.  Wash your hands often with soap and water for at least 20 seconds. This is especially important after blowing your nose, coughing, or sneezing; going to the bathroom; and before eating or preparing food.  Use hand sanitizer if soap and water are not available. Use an alcohol-based hand sanitizer with at least 60% alcohol, covering all surfaces of your hands and rubbing  them together until they feel dry.  Soap and water are the best option, especially if your hands are visibly dirty.  Avoid touching your eyes, nose, and mouth with unwashed hands. Avoid sharing personal household items.  Do not share dishes, drinking glasses, cups, eating utensils, towels, or bedding with other people in your home.  Wash these items thoroughly after using them with soap and water or put them in the dishwasher. Clean all "high-touch" surfaces everyday.  Clean and disinfect high-touch surfaces in your "sick room" and bathroom. Let someone else clean and disinfect surfaces in common areas, but not your bedroom and bathroom.  If a caregiver or other person needs to clean and disinfect a sick person's bedroom or bathroom, they should do so on an as-needed basis. The caregiver/other person should wear a mask and wait as long as possible after the sick person has used the bathroom. High-touch surfaces include phones, remote controls, counters, tabletops, doorknobs, bathroom fixtures, toilets, keyboards, tablets, and bedside tables.  Clean and disinfect areas that may have blood, stool, or body fluids on them.  Use household cleaners and disinfectants. Clean the area or item with soap and water or another detergent if it is dirty. Then use a household disinfectant. ? Be sure to follow the instructions on the label to ensure safe and effective use of the product. Many products recommend keeping the surface wet for several minutes to ensure germs are killed. Many also recommend precautions such as wearing gloves and making sure you have good ventilation during use of the product. ? Most EPA-registered household disinfectants should be effective. How to discontinue home isolation  People with COVID-19 who have stayed home (home isolated) can stop home isolation under the following conditions: ? If you will not have a test to determine if you are still contagious, you can leave home  after these three things have happened:  You have had no fever for at least 72 hours (that is three full days of no fever without the use of medicine that reduces fevers) AND  other symptoms have improved (for example, when your cough or shortness of breath has improved) AND  at least 10 days have passed since your symptoms first appeared. ? If you will be tested to determine if you are still contagious, you can leave home after these three things have happened:  You no longer have a fever (without the use of medicine that reduces fevers) AND  other symptoms have improved (for example, when your cough or shortness of breath has improved) AND  you received two negative tests in a row, 24 hours apart. Your doctor will follow CDC guidelines. In all cases, follow the guidance of your healthcare provider and local health department. The decision to stop home isolation should be made in consultation with your healthcare provider and state and local health departments. Local decisions depend on local circumstances. [email or web address] 05/12/2018 This information is not intended to replace advice given to you by your health care provider. Make sure you discuss any questions you have with your health care provider. Document Released: 04/23/2018 Document Revised: 05/22/2018 Document Reviewed: 04/23/2018  Elsevier Patient Education  2020 [place] on my medicine - ELIQUIS (apixaban)  This medication education was reviewed with me or my healthcare representative as part of my discharge preparation.  The pharmacist that spoke with me during my hospital stay was:    Why was Eliquis prescribed for you? Eliquis was prescribed for you to reduce the risk of a blood clot forming that can cause a stroke if you have a medical condition called atrial fibrillation (a type of irregular heartbeat).  What do You need to know about Eliquis ? Take your Eliquis TWICE DAILY - one tablet in the  morning and one tablet in the evening with or without food. If you have difficulty swallowing the tablet whole please discuss with your pharmacist how to take the medication safely.  Take Eliquis exactly as prescribed by your doctor and DO NOT stop taking Eliquis without talking to the doctor who prescribed the medication.  Stopping may increase your risk of developing a stroke.  Refill your prescription before you run out.  After discharge, you should have regular check-up appointments with your healthcare provider that is prescribing your Eliquis.  In the future your dose may need to be changed if your kidney function or weight changes by a significant amount or as you get older.  What do you do if you miss a dose? If you miss a dose, take it as soon as you remember on the same day and resume taking twice daily.  Do not take more than one dose of ELIQUIS at the same time to make up a missed dose.  Important Safety Information A possible side effect of Eliquis is bleeding. You should call your healthcare provider right away if you experience any of the following: ? Bleeding from an injury or your nose that does not stop. ? Unusual colored urine (red or dark brown) or unusual colored stools (red or black). ? Unusual bruising for unknown reasons. ? A serious fall or if you hit your head (even if there is no bleeding).  Some medicines may interact with Eliquis and might increase your risk of bleeding or clotting while on Eliquis. To help avoid this, consult your healthcare provider or pharmacist prior to using any new prescription or non-prescription medications, including herbals, vitamins, non-steroidal anti-inflammatory drugs (NSAIDs) and supplements.  This website has more information on Eliquis (apixaban): http://www.eliquis.com/eliquis/home

## 2018-07-22 NOTE — TOC Transition Note (Signed)
Transition of Care [facility]) - CM/SW Discharge Note   Patient Details  Name: Nicholas [name] MRN: [id number] Date of Birth: 1933/11/23  Transition of Care [facility] CM/SW Contact:  Zenon Mayo, RN Phone Number: 07/22/2018, 3:50 PM   Clinical Narrative:    NCM received call from Secretary ,stating son would like a call back.  NCM contacted son at [phone].  He said patient was discharged Sunday night at 7pm and MD states he needs HHPT and that MD was going to leave Case Management a messaged , but son states he has not heard anything.  This NCM informed him that would get HHPT set up and asked if he had a preference , he said no.  NCM made referral to [facility] with [facility], she states she can take referral for HHPT. Soc will begin 24-48 hrs.    Final next level of care: Home w Home Health Services Barriers to Discharge: No Barriers Identified   Patient Goals and CMS Choice   CMS Medicare.gov Compare Post Acute Care list provided to:: Patient Represenative (must comment)(son) Choice offered to / list presented to : Adult Children  Discharge Placement                       Discharge Plan and Services                DME Arranged: (NA)         HH Arranged: PT HH Agency: [facility] Date [place] Agency Contacted: 07/22/18 Time [place]: [place] Representative spoke with at [place]: [place] ([place]) Interventions     Readmission Risk Interventions No flowsheet data found.

## 2018-07-22 NOTE — Discharge Summary (Addendum)
Name: Nicholas [name] MRN: [id number] DOB: Dec 03, 1933 85 y.o. PCP: Street, [name], MD  Date of Admission: 07/18/2018  9:58 AM Date of Discharge: 07/21/2018 Attending Physician: Lenice Pressman, MD, PhD  Discharge Diagnosis: 1. TIA 2. Atrial fibrillation 3. COVID-19 pneumonia  Discharge Medications: Allergies as of 07/21/2018      Reactions   Penicillins Hives   Childhood allergy Has patient had a PCN reaction causing immediate rash, facial/tongue/throat swelling, SOB or lightheadedness with hypotension: Yes Has patient had a PCN reaction causing severe rash involving mucus membranes or skin necrosis: Yes Has patient had a PCN reaction that required hospitalization: Yes Has patient had a PCN reaction occurring within the last 10 years: No If all of the above answers are "NO", then may proceed with Cephalosporin use.      Medication List    STOP taking these medications   amLODipine 10 MG tablet Commonly known as: NORVASC   clopidogrel 75 MG tablet Commonly known as: PLAVIX   furosemide 20 MG tablet Commonly known as: LASIX   hydrALAZINE 10 MG tablet Commonly known as: APRESOLINE     TAKE these medications   acetaminophen 500 MG tablet Commonly known as: TYLENOL Take 500-1,000 mg by mouth every 8 (eight) hours as needed (for pain or headaches).   albuterol 108 (90 Base) MCG/ACT inhaler Commonly known as: VENTOLIN HFA Inhale 2 puffs into the lungs 2 (two) times a day.   allopurinol 100 MG tablet Commonly known as: ZYLOPRIM Take 100 mg by mouth daily.   apixaban 2.5 MG Tabs tablet Commonly known as: ELIQUIS Take 1 tablet (2.5 mg total) by mouth 2 (two) times daily.   bisoprolol 5 MG tablet Commonly known as: ZEBETA Take 2.5 mg by mouth every morning.   CALCIUM 600+D3 PO Take 2 tablets by mouth daily.   Fish Oil 1000 MG Caps Take 1,000 mg by mouth daily.   Fluticasone-Salmeterol 250-50 MCG/DOSE Aepb Commonly known as: ADVAIR Inhale 1 puff into  the lungs 2 (two) times daily.   latanoprost 0.005 % ophthalmic solution Commonly known as: XALATAN Place 1 drop into both eyes at bedtime.   multivitamin with minerals Tabs tablet Take 1 tablet by mouth every morning.   pantoprazole 20 MG tablet Commonly known as: Protonix Take 1 tablet (20 mg total) by mouth daily.       Disposition and follow-up:   [name] was discharged from [facility] in Stable condition.  At the hospital follow up visit please address:  1.  Any further TIA events, atrial fibrillation and heart rate, tolerance of apixiban, and resolution of COVID-19 symptoms. Please check healing of his right arm laceration. Please ensure he has a follow up CXR.  2.  Labs / imaging needed at time of follow-up: BMP, CBC, CXR to reevaluate right lung base opacity  3.  Pending labs/ test needing follow-up: None  Follow-up Appointments: Follow-up Information    [facility]. Schedule an appointment as soon as possible for a visit in 4 week(s).   Specialty: Neurology Contact information: [address] [place] Anderson, [name], MD. Schedule an appointment as soon as possible for a visit.   Specialty: Family Medicine Contact information: [place] [address] [place] [facility] Course by problem list: 1. TIA, atrial fibrillation:  Presented after a fall when getting out of  bed in the morning accompanied by transient aphasia and confusion. Asymptomatic by the time of admission. CT head negative, MRI/MRA brain initially unable to be completed due to claustrophobia, eventually obtained with ativan pretreatment, no ischemic changes and normal MRA. Noted to have LDL 62, A1C 5.9%. New atrial fibrillation diagnosed, transitioned clopidogrel to apixiban. His home BP medications were held, but bisoprolol was restarted when he had atrial  fibrillation with RVR, with subsequently good rate control. TTE was unremarkable but did not visualize the septum well. Further investigation was not pursued as anticoagulation was planned. PT recommended HH PT, which was ordered. He will follow up with stroke clinic.  2. COVID-19: Screening test positive at admission, asymptomatic at the time. CXR showed mild bilateral hazy opacities, also rounded airspace opacity at the right lung base new from prior. Inflammatory markers were elevated (LDH 201, CRP 7.7, fibrinogen 527, d-dimer 0.6, IL-6 155). Remained afebrile, but eventually developed mild hypoxia requiring 2L O2 by [place], at which point he was treated with remdesivir and dexamethasone. He was able to be weaned from O2 and discharged home with return instructions. His labs had improved slightly at discharge (CRP 4.8, fibrinogen 444, d-dimer still 0.6).  Please ensure he has a repeat CXR, and if there is still a persistent opacity at the right lung base, evaluate further with chest CT.   Discharge Vitals:   BP 134/81 (BP Location: Right Arm)   Pulse 89   Temp (!) 97.5 F (36.4 C) (Axillary)   Resp 20   Ht 6' (1.829 m)   Wt 81 kg   SpO2 93%   BMI 24.21 kg/m    Discharge Exam: General: lying inbed, comfortable HEENT: normal sclera,moistmucous membranes Cardiac:irregularly irregular, brisk pulses and warm extremities, no edema Pulmonary:lungs difficult to hear with room noise and disposable stethoscope, but sounds clear bilaterally with normal respiratory effort Abdomen: soft, non-tender, no masses, normal bowel sounds Extremities: No synovitis or joint effusions Skin: laceration on right elbowcovered with dressing, small laceration and bruise on right knee Neuro: Alert, normal speech, PERRL, EOMI, smile symmetric, facial sensation intact and equal, tongue protrudes midline, shoulder shrug symmetric. Strength 5/5 throughout, sensation equal and intact to light touch.  Pertinent Labs,  Studies, and Procedures:  hsTroponin 30, 26 BNP 299 LDL 62 A1C 5.9% LDH 201, 255 CRP 7.7, 9.3, 4.8 Procalcitonin <0.1 Fibrinogen 527, 531, 444 D-dimer 0.6, 0.6, 0.6  CXR 7/9: IMPRESSION: 1. Cardiomegaly with mild vascular congestion. 2. Rounded airspace opacity at the right lung base, not well appreciated on prior exam. Attention on follow-up examinations is Recommended.  CT Head 7/9: IMPRESSION: 1. No acute intracranial pathology.  R Elbow XR 7/9: IMPRESSION: No acute abnormality.  Pelvis XR 7/9: IMPRESSION: No acute abnormality.  MR Brain/MRA head/neck 7/11: IMPRESSION: 1. No acute intracranial abnormality. 2. Normal intracranial MRA. 3. Mildly motion degraded MRA of the neck without occlusion or visible stenosis.  TTE 7/10: IMPRESSIONS  1. The left ventricle has normal systolic function, with an ejection fraction of 55-60%. The cavity size was normal. Left ventricular diastolic Doppler parameters are indeterminate.  2. The mitral valve is abnormal. Mild thickening of the mitral valve leaflet. There is mild mitral annular calcification present.  3. The aortic valve was not well visualized. No stenosis of the aortic valve.  4. The interatrial septum was not assessed.  Discharge Instructions: Discharge Instructions    MyChart COVID-19 home monitoring program   Complete by: Jul 21, 2018    Is the patient willing to  use the MyChart Mobile App for home monitoring?: No   Ambulatory referral to Neurology   Complete by: As directed    Follow up with stroke clinic NP (Jessica Vanschaick or Cecille Rubin, if both not available, consider Zachery Dauer, or [place]) at [facility] in about 4 weeks. Thanks.   Call MD for:  difficulty breathing, headache or visual disturbances   Complete by: As directed    Call MD for:  persistant dizziness or light-headedness   Complete by: As directed    Call MD for:  persistant nausea and vomiting   Complete by: As directed    Call MD for:   severe uncontrolled pain   Complete by: As directed    Diet - low sodium heart healthy   Complete by: As directed    Discharge instructions   Complete by: As directed    Thank you for allowing [place] to care for you   Your symptoms were due to a Transient Ischemic Attack (sometimes called a "mini stroke") believed to have been caused by a clot that came from your heart. This clot was able to form because your heart was in atrial fibrillation (an abnormal rhythm)  - Please take Eliquis (a blood thinner) 2.5mg , Twice a day  - - This has been sent to your pharmacy ([facility]), please pick this up in the morning and start taking  - You will be contacted tomorrow about home health physical therapy   You tested positive for COVID-19, please quarantine at home following the procedures at the end of this AVS.   Your blood pressure has been well controlled on only Bisoprolol here  - Contnue bisoprolol  - Do not take Lasix, Amlodipine, or Hydralazine until you have follow up with your doctor   Follow up with PCP in the next 1-2 weeks   Follow up with Neurology as scheduled by them   Increase activity slowly   Complete by: As directed       I personally spent >30 minutes examining the patient, explaining the plan of care, and coordinating his discharge.  Signed: Oda Kilts, MD 07/22/2018, 4:16 PM

## 2018-08-09 DIAGNOSIS — J984 Other disorders of lung: Secondary | ICD-10-CM | POA: Diagnosis not present

## 2018-08-09 DIAGNOSIS — I480 Paroxysmal atrial fibrillation: Secondary | ICD-10-CM | POA: Diagnosis not present

## 2018-08-09 DIAGNOSIS — I5022 Chronic systolic (congestive) heart failure: Secondary | ICD-10-CM | POA: Diagnosis not present

## 2018-08-09 DIAGNOSIS — Z6826 Body mass index (BMI) 26.0-26.9, adult: Secondary | ICD-10-CM | POA: Diagnosis not present

## 2018-08-09 DIAGNOSIS — E663 Overweight: Secondary | ICD-10-CM | POA: Diagnosis not present

## 2018-08-09 DIAGNOSIS — D6869 Other thrombophilia: Secondary | ICD-10-CM | POA: Diagnosis not present

## 2018-08-09 DIAGNOSIS — I259 Chronic ischemic heart disease, unspecified: Secondary | ICD-10-CM | POA: Diagnosis not present

## 2018-08-09 DIAGNOSIS — I679 Cerebrovascular disease, unspecified: Secondary | ICD-10-CM | POA: Diagnosis not present

## 2018-08-20 DIAGNOSIS — H401132 Primary open-angle glaucoma, bilateral, moderate stage: Secondary | ICD-10-CM | POA: Diagnosis not present

## 2018-08-20 DIAGNOSIS — D3131 Benign neoplasm of right choroid: Secondary | ICD-10-CM | POA: Diagnosis not present

## 2018-08-20 DIAGNOSIS — H5203 Hypermetropia, bilateral: Secondary | ICD-10-CM | POA: Diagnosis not present

## 2018-08-20 DIAGNOSIS — H25013 Cortical age-related cataract, bilateral: Secondary | ICD-10-CM | POA: Diagnosis not present

## 2018-08-28 ENCOUNTER — Encounter: Payer: Self-pay | Admitting: Adult Health

## 2018-08-28 ENCOUNTER — Ambulatory Visit: Payer: PPO | Admitting: Adult Health

## 2018-08-28 ENCOUNTER — Other Ambulatory Visit: Payer: Self-pay

## 2018-08-28 VITALS — BP 143/58 | HR 55 | Temp 97.5°F | Ht 72.0 in | Wt 187.4 lb

## 2018-08-28 DIAGNOSIS — I480 Paroxysmal atrial fibrillation: Secondary | ICD-10-CM

## 2018-08-28 DIAGNOSIS — G459 Transient cerebral ischemic attack, unspecified: Secondary | ICD-10-CM | POA: Diagnosis not present

## 2018-08-28 DIAGNOSIS — E785 Hyperlipidemia, unspecified: Secondary | ICD-10-CM

## 2018-08-28 NOTE — Patient Instructions (Signed)
Continue Eliquis (apixaban) daily  and omega-3 for secondary stroke prevention  Continue to follow up with PCP regarding cholesterol and blood pressure management   Schedule follow-up with your cardiologist for ongoing management and monitoring of atrial fibrillation and Eliquis prescribing  Continue to stay active and maintain a healthy diet  Continue to monitor blood pressure at home  Maintain strict control of hypertension with blood pressure goal below 130/90, diabetes with hemoglobin A1c goal below 6.5% and cholesterol with LDL cholesterol (bad cholesterol) goal below 70 mg/dL. I also advised the patient to eat a healthy diet with plenty of whole grains, cereals, fruits and vegetables, exercise regularly and maintain ideal body weight.  Followup in the future with me in 3 months or call earlier if needed       Thank you for coming to see [place] at [facility]. I hope we have been able to provide you high quality care today.  You may receive a patient satisfaction survey over the next few weeks. We would appreciate your feedback and comments so that we may continue to improve ourselves and the health of our patients.

## 2018-08-28 NOTE — Progress Notes (Signed)
I agree with the above plan 

## 2018-08-28 NOTE — Progress Notes (Signed)
[facility] [address] [place]. [place] [address] [phone]       OFFICE FOLLOW UP NOTE  [name] Date of Birth:  29-May-1933 Medical Record Number:  [id number]   Referring MD:   Christa See  Reason for Referral: TIA  Chief Complaint  Patient presents with   Hospitalization Follow-up    TIA f/u. Daughter present. Rm 9. Patient's daughter mentioned that he seems to be doing well.      HPI:   08/28/2018 update: [name] is a 85 year old male who is being seen today for stroke follow-up accompanied by his daughter.  He did present to the ED on 07/15/2018 with complaints of feeling off balance and dizziness with intermittent headache over 2 to 3 days.  Initially presented to urgent care who advised to be evaluated in ER.  MRI negative for acute findings.  Lab work unremarkable.  Discharged home in stable condition.  He returned on 07/18/2018 after a fall with expressive aphasia.  He was found to have new onset A. fib.  Neurology consulted with the stroke work-up completed with diagnosis of probable TIA without acute findings on MRI.  MRA and 2D echo unremarkable.  Recommended initiating Eliquis due to new onset atrial fibrillation.  COVID testing positive though asymptomatic.  HTN and HLD stable.  He was discharged home with recommendations of home health PT/OT.  He has been doing well since discharge without recurring stroke/TIA symptoms.  He continues on Eliquis without bleeding or bruising.  Plans on scheduling follow-up visit with previously established cardiologist as prior visit canceled per daughter.  Blood pressure today 143/58.  No further concerns at this time.    Initial visit 12/10/2017 PS: [name] is a pleasant 85 year old Caucasian male seen today for initial office consultation visit for dizziness and TIA.  He is accompanied by his grandson [name] and history is obtained from them and review of electronic medical records.  I personally  reviewed imaging films.  He is 85 year old male with past medical history for hypertension, asthma, chronic kidney disease and recent diagnosis of congestive heart failure who presented on 11/01/2017 to [facility] emergency room with complaints of severe headache, dizziness, transient right hand weakness with accompanying multifocal symptoms of worsening fatigue, shortness of breath, increased pedal edema and worsening kidney function.  He has been taking diuretics and blood pressure medications for elevated blood pressure.  He had previously been seen at urgent care in [place] a week ago with somewhat similar but less prominent symptoms.  He was diagnosed as congestive heart failure at that time and started on diuretic and blood pressure medication.  On exam he was found to have congestive heart failure but he did undergo an MRI scan of the brain which I personally reviewed on 11/01/2017 which was unremarkable.  CT scan of the head was also unremarkable.  Carotid ultrasound showed no significant extracranial stenosis.  Transthoracic echo showed normal ejection fraction but abnormal left ventricular relaxation consistent with grade 1 diastolic dysfunction.  The patient states that for several hours that they was off balance dizzy walking like a drunk and he had transient right hand weakness for little while.  He denies any other history of stroke or TIAs in the past.  He denies any accompanying diplopia or loss of vision but did complain of mild nausea and vomiting.  There is no history of migraine headache seizures or loss of consciousness.  Since discharge from the ER patient has not had any  further episodes of dizziness right hand weakness.  He was previously on aspirin 81 mg the dose of which was increased to 325 mg.  He states his blood pressure has been doing better.  Patient is living with his grandson.  He is independent in activities of daily living.  Is significantly hard of hearing.  He states that he  had a somewhat similar episode in March 2019 when he developed episode of dizziness with cold clammy skin and sweating.  He was seen at [facility] and evaluated by tele-stroke neurologist.  MRI scan of the brain at that time was also negative.  Patient has not had CT angiogram or MR angiograms done during any of his these 3 ER visits.       ROS:   14 system review of systems is positive for no concerns only and all other systems negative  PMH:  Past Medical History:  Diagnosis Date   Anemia    none recent   Arthritis    Asthma    Chronic kidney disease    ckd stage 3   Family history of adverse reaction to anesthesia    daughter has ponv    Hypertension Hyperlipidemia Congestive heart failure     Social History:  Social History   Socioeconomic History   Marital status: Married    Spouse name: Not on file   Number of children: 5   Years of education: 8   Highest education level: Not on file  Occupational History   Occupation: Retired   Scientist, product/process development strain: Not on file   Food insecurity    Worry: Not on file    Inability: Not on Lexicographer needs    Medical: Not on file    Non-medical: Not on file  Tobacco Use   Smoking status: Former Smoker   Smokeless tobacco: Never Used   Tobacco comment: quit 35 yrs ago  Substance and Sexual Activity   Alcohol use: No   Drug use: No   Sexual activity: Not on file  Lifestyle   Physical activity    Days per week: Not on file    Minutes per session: Not on file   Stress: Not on file  Relationships   Social connections    Talks on phone: Not on file    Gets together: Not on file    Attends religious service: Not on file    Active member of club or organization: Not on file    Attends meetings of clubs or organizations: Not on file    Relationship status: Not on file   Intimate partner violence    Fear of current or ex partner: Not on file    Emotionally  abused: Not on file    Physically abused: Not on file    Forced sexual activity: Not on file  Other Topics Concern   Not on file  Social History Narrative   Lives with grandson, [name].    Caffeine use: daily   Right handed     Medications:   Current Outpatient Medications on File Prior to Visit  Medication Sig Dispense Refill   albuterol (PROVENTIL HFA;VENTOLIN HFA) 108 (90 Base) MCG/ACT inhaler Inhale 2 puffs into the lungs every 4 (four) hours as needed.      allopurinol (ZYLOPRIM) 100 MG tablet Take 100 mg by mouth daily.     apixaban (ELIQUIS) 2.5 MG TABS tablet Take 1 tablet (2.5 mg total) by mouth 2 (two)  times daily. 60 tablet 0   bisoprolol (ZEBETA) 5 MG tablet Take 2.5 mg by mouth every morning.      Calcium Carb-Cholecalciferol (CALCIUM 600+D3 PO) Take 2 tablets by mouth daily.     Fluticasone-Salmeterol (ADVAIR) 250-50 MCG/DOSE AEPB Inhale 1 puff into the lungs 2 (two) times daily.     latanoprost (XALATAN) 0.005 % ophthalmic solution Place 1 drop into both eyes at bedtime.     Multiple Vitamin (MULTIVITAMIN WITH MINERALS) TABS tablet Take 1 tablet by mouth every morning.     Omega-3 Fatty Acids (FISH OIL) 1000 MG CAPS Take 1,000 mg by mouth daily.     pantoprazole (PROTONIX) 20 MG tablet Take 1 tablet (20 mg total) by mouth daily. 30 tablet 3   acetaminophen (TYLENOL) 500 MG tablet Take 500-1,000 mg by mouth every 8 (eight) hours as needed (for pain or headaches).      No current facility-administered medications on file prior to visit.     Allergies:   Allergies  Allergen Reactions   Penicillins Hives    Childhood allergy Has patient had a PCN reaction causing immediate rash, facial/tongue/throat swelling, SOB or lightheadedness with hypotension: Yes Has patient had a PCN reaction causing severe rash involving mucus membranes or skin necrosis: Yes Has patient had a PCN reaction that required hospitalization: Yes Has patient had a PCN reaction occurring  within the last 10 years: No If all of the above answers are "NO", then may proceed with Cephalosporin use.     Physical Exam  Today's Vitals   08/28/18 0904  Temp: (!) 97.5 F (36.4 C)  TempSrc: Oral  Weight: 187 lb 6.4 oz (85 kg)  Height: 6' (1.829 m)   Body mass index is 25.42 kg/m.   General: frail elderly caucasian male, seated, in no evident distress Head: head normocephalic and atraumatic.   Neck: supple with no carotid or supraclavicular bruits Cardiovascular: regular rate and rhythm, no murmurs Musculoskeletal: no deformity Skin:  no rash/petichiae Vascular:  Normal pulses all extremities  Neurologic Exam Mental Status: Awake and fully alert. Oriented to place and time. Recent and remote memory intact. Attention span, concentration and fund of knowledge appropriate. Mood and affect appropriate.  Cranial Nerves: Fundoscopic exam reveals sharp disc margins. Pupils equal, briskly reactive to light. Extraocular movements full without nystagmus. Visual fields full to confrontation. Hearing significantly diminished bilaterally. Facial sensation intact. Face, tongue, palate moves normally and symmetrically.  Motor: Normal bulk and tone. Normal strength in all tested extremity muscles. Sensory.: intact to touch , pinprick , position and vibratory sensation.  Coordination: Rapid alternating movements normal in all extremities. Finger-to-nose and heel-to-shin performed accurately bilaterally. Gait and Station: Arises from chair without difficulty. Stance is normal. Gait demonstrates normal stride length and balance .  Reflexes: 1+ and symmetric. Toes downgoing.      NIHSS  0 Modified Rankin  0     IMAGING:  Dg Elbow Complete Right 07/18/2018 IMPRESSION:  No acute abnormality.   Ct Head Wo Contrast 07/18/2018 IMPRESSION:  1. No acute intracranial pathology.   Ct Head Wo Contrast 07/15/2018 IMPRESSION:  1.  No acute intracranial pathology.  2. 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    Mr Brain Wo Contrast 07/16/2018 IMPRESSION:  1. No acute intracranial abnormality or significant interval change. No focal etiology to explain ataxia.  2. Stable atrophy and white matter disease.  3. Mega cisterna magna again noted.  4. Acute sinusitis.   Dg Chest [facility]  1 View  07/18/2018 IMPRESSION:  1. Cardiomegaly with mild vascular congestion.  2. Rounded airspace opacity at the right lung base, not well appreciated on prior exam. Attention on follow-up examinations is recommended.   Dg Chest Port 1 View 07/16/2018 IMPRESSION:  1. Mild pulmonary vascular congestion and low lung volumes.  2. Aortic atherosclerosis.   Dg Hip Unilat W Or Wo Pelvis 2-3 Views Left 07/18/2018 IMPRESSION:  No acute abnormality.    Transthoracic Echocardiogram  07/20/2018 IMPRESSIONS 1. The left ventricle has normal systolic function, with an ejection fraction of 55-60%. The cavity size was normal. Left ventricular diastolic Doppler parameters are indeterminate. 2. The mitral valve is abnormal. Mild thickening of the mitral valve leaflet. There is mild mitral annular calcification present. 3. The aortic valve was not well visualized. No stenosis of the aortic valve. 4. The interatrial septum was not assessed.     ASSESSMENT: 85 year old Caucasian male with episodes of transient dizziness, gait ataxia and headache possibly vertebrobasilar TIAs in 10/2017 and 07/2018 with recent admission finding new onset A. fib therefore initiated Eliquis.  Vascular risk factors of new onset A. fib, hypertension, hyperlipidemia, CHF and age.  He also had positive COVID-19 testing on 07/18/2018 asymptomatic.  Has been doing well from a neurological standpoint without residual deficits or reoccurring symptoms.     PLAN: 1. TIA: Continue Eliquis (apixaban) daily  and omega-3 for secondary stroke prevention. Maintain strict control of hypertension with blood  pressure goal below 130/90, diabetes with hemoglobin A1c goal below 6.5% and cholesterol with LDL cholesterol (bad cholesterol) goal below 70 mg/dL.  I also advised the patient to eat a healthy diet with plenty of whole grains, cereals, fruits and vegetables, exercise regularly with at least 30 minutes of continuous activity daily and maintain ideal body weight. 2. HTN: Advised to continue current treatment regimen.  Today's BP 143/58.  Advised to continue to monitor at home along with continued follow-up with PCP for management 3. HLD: Advised to continue current treatment regimen along with continued follow-up with PCP for future prescribing and monitoring of lipid panel 4. Atrial fibrillation: Continue Eliquis and ensure follow-up visit scheduled with cardiology for ongoing management and monitoring  Follow-up in 3 months or call earlier if needed   Greater than 50% of time during this 25 minute visit was spent on counseling,explanation of diagnosis of recurrent TIAs, reviewing risk factor management of atrial fibrillation, HTN and HLD, planning of further management, discussion with patient and family and coordination of care  Frann Rider, [facility]  [facility] [address] [place], [place] [address]  Phone [phone] Fax [phone] Note: This document was prepared with digital dictation and possible smart phrase technology. Any transcriptional errors that result from this process are unintentional.

## 2018-09-19 DIAGNOSIS — N401 Enlarged prostate with lower urinary tract symptoms: Secondary | ICD-10-CM | POA: Diagnosis not present

## 2018-09-21 DIAGNOSIS — Z23 Encounter for immunization: Secondary | ICD-10-CM | POA: Diagnosis not present

## 2018-09-22 ENCOUNTER — Emergency Department (HOSPITAL_COMMUNITY): Payer: PPO

## 2018-09-22 ENCOUNTER — Telehealth: Payer: Self-pay | Admitting: *Deleted

## 2018-09-22 ENCOUNTER — Emergency Department (HOSPITAL_COMMUNITY)
Admission: EM | Admit: 2018-09-22 | Discharge: 2018-09-22 | Disposition: A | Discharge status: Home / Self Care | Payer: PPO | Attending: Emergency Medicine | Admitting: Emergency Medicine

## 2018-09-22 ENCOUNTER — Other Ambulatory Visit: Payer: Self-pay

## 2018-09-22 ENCOUNTER — Encounter (HOSPITAL_COMMUNITY): Payer: Self-pay | Admitting: Emergency Medicine

## 2018-09-22 DIAGNOSIS — N183 Chronic kidney disease, stage 3 (moderate): Secondary | ICD-10-CM | POA: Diagnosis not present

## 2018-09-22 DIAGNOSIS — Y939 Activity, unspecified: Secondary | ICD-10-CM | POA: Insufficient documentation

## 2018-09-22 DIAGNOSIS — Z87891 Personal history of nicotine dependence: Secondary | ICD-10-CM | POA: Diagnosis not present

## 2018-09-22 DIAGNOSIS — S7012XA Contusion of left thigh, initial encounter: Secondary | ICD-10-CM | POA: Insufficient documentation

## 2018-09-22 DIAGNOSIS — S3992XA Unspecified injury of lower back, initial encounter: Secondary | ICD-10-CM | POA: Diagnosis not present

## 2018-09-22 DIAGNOSIS — Z8673 Personal history of transient ischemic attack (TIA), and cerebral infarction without residual deficits: Secondary | ICD-10-CM | POA: Diagnosis not present

## 2018-09-22 DIAGNOSIS — M545 Low back pain: Secondary | ICD-10-CM | POA: Diagnosis not present

## 2018-09-22 DIAGNOSIS — I509 Heart failure, unspecified: Secondary | ICD-10-CM | POA: Insufficient documentation

## 2018-09-22 DIAGNOSIS — Z79899 Other long term (current) drug therapy: Secondary | ICD-10-CM | POA: Insufficient documentation

## 2018-09-22 DIAGNOSIS — Y999 Unspecified external cause status: Secondary | ICD-10-CM | POA: Insufficient documentation

## 2018-09-22 DIAGNOSIS — R05 Cough: Secondary | ICD-10-CM | POA: Diagnosis not present

## 2018-09-22 DIAGNOSIS — Y929 Unspecified place or not applicable: Secondary | ICD-10-CM | POA: Diagnosis not present

## 2018-09-22 DIAGNOSIS — S79912A Unspecified injury of left hip, initial encounter: Secondary | ICD-10-CM | POA: Diagnosis not present

## 2018-09-22 DIAGNOSIS — W1789XA Other fall from one level to another, initial encounter: Secondary | ICD-10-CM | POA: Diagnosis not present

## 2018-09-22 DIAGNOSIS — I130 Hypertensive heart and chronic kidney disease with heart failure and stage 1 through stage 4 chronic kidney disease, or unspecified chronic kidney disease: Secondary | ICD-10-CM | POA: Diagnosis not present

## 2018-09-22 DIAGNOSIS — Z7901 Long term (current) use of anticoagulants: Secondary | ICD-10-CM | POA: Diagnosis not present

## 2018-09-22 DIAGNOSIS — M25552 Pain in left hip: Secondary | ICD-10-CM | POA: Diagnosis not present

## 2018-09-22 LAB — BASIC METABOLIC PANEL
Anion gap: 6 (ref 5–15)
BUN: 18 mg/dL (ref 8–23)
CO2: 25 mmol/L (ref 22–32)
Calcium: 8.7 mg/dL — ABNORMAL LOW (ref 8.9–10.3)
Chloride: 107 mmol/L (ref 98–111)
Creatinine, Ser: 1.46 mg/dL — ABNORMAL HIGH (ref 0.61–1.24)
GFR calc Af Amer: 51 mL/min — ABNORMAL LOW (ref >60)
GFR calc non Af Amer: 44 mL/min — ABNORMAL LOW (ref >60)
Glucose, Bld: 132 mg/dL — ABNORMAL HIGH (ref 70–99)
Potassium: 4.5 mmol/L (ref 3.5–5.1)
Sodium: 138 mmol/L (ref 135–145)

## 2018-09-22 LAB — CBC WITH DIFFERENTIAL/PLATELET
Abs Immature Granulocytes: 0.09 10*3/uL — ABNORMAL HIGH (ref 0.00–0.07)
Basophils Absolute: 0.1 10*3/uL (ref 0.0–0.1)
Basophils Relative: 1 %
Eosinophils Absolute: 0.3 10*3/uL (ref 0.0–0.5)
Eosinophils Relative: 2 %
HCT: 35.5 % — ABNORMAL LOW (ref 39.0–52.0)
Hemoglobin: 10.8 g/dL — ABNORMAL LOW (ref 13.0–17.0)
Immature Granulocytes: 1 %
Lymphocytes Relative: 25 %
Lymphs Abs: 3.5 10*3/uL (ref 0.7–4.0)
MCH: 25.7 pg — ABNORMAL LOW (ref 26.0–34.0)
MCHC: 30.4 g/dL (ref 30.0–36.0)
MCV: 84.5 fL (ref 80.0–100.0)
Monocytes Absolute: 0.9 10*3/uL (ref 0.1–1.0)
Monocytes Relative: 6 %
Neutro Abs: 9.2 10*3/uL — ABNORMAL HIGH (ref 1.7–7.7)
Neutrophils Relative %: 65 %
Platelets: 323 10*3/uL (ref 150–400)
RBC: 4.2 MIL/uL — ABNORMAL LOW (ref 4.22–5.81)
RDW: 14.6 % (ref 11.5–15.5)
WBC: 14 10*3/uL — ABNORMAL HIGH (ref 4.0–10.5)
nRBC: 0 % (ref 0.0–0.2)

## 2018-09-22 LAB — PROTIME-INR
INR: 1.2 (ref 0.8–1.2)
Prothrombin Time: 15 seconds (ref 11.4–15.2)

## 2018-09-22 MED ORDER — HYDROCODONE-ACETAMINOPHEN 5-325 MG PO TABS: 1.0000 | ORAL_TABLET | ORAL | 0 refills | Status: DC | PRN

## 2018-09-22 MED ORDER — MORPHINE SULFATE (PF) 4 MG/ML IV SOLN
4.0000 mg | Freq: Once | INTRAVENOUS | Status: AC
  Administered 2018-09-22: 10:00:00 4 mg via INTRAVENOUS
  Filled 2018-09-22: qty 1

## 2018-09-22 MED ORDER — MORPHINE SULFATE (PF) 4 MG/ML IV SOLN
4.0000 mg | Freq: Once | INTRAVENOUS | Status: AC
  Administered 2018-09-22: 09:00:00 4 mg via INTRAVENOUS
  Filled 2018-09-22: qty 1

## 2018-09-22 MED ORDER — ONDANSETRON HCL 4 MG/2ML IJ SOLN
4.0000 mg | Freq: Once | INTRAMUSCULAR | Status: AC
  Administered 2018-09-22: 4 mg via INTRAVENOUS
  Filled 2018-09-22: qty 2

## 2018-09-22 NOTE — ED Triage Notes (Signed)
Pt arrives s/p fall 8 days ago c/o pain to LUE posterior thigh.  Bruise present.

## 2018-09-22 NOTE — Telephone Encounter (Signed)
TOC CM received call from pt's son, Bryann Mcnealy. Stating his pharmacy is closed on weekend. Requested Rx be sent to [facility] in [place]. Contacted Ed provider and Rx sent to [facility]. Made son aware. [place], [place] ED TOC CM [phone]

## 2018-09-22 NOTE — ED Provider Notes (Signed)
[facility] EMERGENCY DEPARTMENT Provider Note   CSN: [id number] Arrival date & time: 09/22/18  5427     History   Chief Complaint Chief Complaint  Patient presents with   Leg Pain    HPI Nicholas [name] is a 85 y.o. male.     Pt presents to the ED today with left leg pain.  The pt fell about a week ago from a step stool.  He said he landed on his back and did not hit his head.  He is on blood thinners (Eliquis) for afib.  He has been walking around and mowed the lawn yesterday.  This morning, pain was severe.  He denies any new falls.  The pt has recovered from a covid pneumonia back in early July.     Past Medical History:  Diagnosis Date   Anemia    none recent   Arthritis    Asthma    Chronic kidney disease    ckd stage 3   Family history of adverse reaction to anesthesia    daughter has ponv    Glaucoma    Hypertension     Patient Active Problem List   Diagnosis Date Noted   Pneumonia due to COVID-19 virus 07/19/2018   COVID-19 virus infection 07/18/2018   Renal insufficiency 11/27/2017   Hyperlipidemia 11/02/2017   TIA (transient ischemic attack) 11/01/2017   CHF (congestive heart failure) ([place]) 11/01/2017   HTN (hypertension) 11/01/2017    Past Surgical History:  Procedure Laterality Date   COLONOSCOPY WITH PROPOFOL N/A 02/03/2015   Procedure: COLONOSCOPY WITH PROPOFOL;  Surgeon: Arta Silence, MD;  Location: [facility] ENDOSCOPY;  Service: Endoscopy;  Laterality: N/A;   KNEE ARTHROSCOPY Left    ROTATOR CUFF REPAIR Right    SHOULDER ARTHROSCOPY Left         Home Medications    Prior to Admission medications   Medication Sig Start Date End Date Taking? Authorizing Provider  acetaminophen (TYLENOL) 500 MG tablet Take 500-1,000 mg by mouth every 8 (eight) hours as needed (for pain or headaches).     [provider]  albuterol (PROVENTIL HFA;VENTOLIN HFA) 108 (90 Base) MCG/ACT inhaler Inhale 2 puffs into the  lungs every 4 (four) hours as needed.     [provider]  allopurinol (ZYLOPRIM) 100 MG tablet Take 100 mg by mouth daily.    [provider]  apixaban (ELIQUIS) 2.5 MG TABS tablet Take 1 tablet (2.5 mg total) by mouth 2 (two) times daily. 07/21/18   Neva Seat, MD  bisoprolol (ZEBETA) 5 MG tablet Take 2.5 mg by mouth every morning.     [provider]  Calcium Carb-Cholecalciferol (CALCIUM 600+D3 PO) Take 2 tablets by mouth daily.    [provider]  Fluticasone-Salmeterol (ADVAIR) 250-50 MCG/DOSE AEPB Inhale 1 puff into the lungs 2 (two) times daily.    [provider]  HYDROcodone-acetaminophen (NORCO/VICODIN) 5-325 MG tablet Take 1 tablet by mouth every 4 (four) hours as needed. 09/22/18   Isla Pence, MD  latanoprost (XALATAN) 0.005 % ophthalmic solution Place 1 drop into both eyes at bedtime. 07/26/17   [provider]  Multiple Vitamin (MULTIVITAMIN WITH MINERALS) TABS tablet Take 1 tablet by mouth every morning.    [provider]  Omega-3 Fatty Acids (FISH OIL) 1000 MG CAPS Take 1,000 mg by mouth daily.    [provider]  pantoprazole (PROTONIX) 20 MG tablet Take 1 tablet (20 mg total) by mouth daily. 04/01/18  Garvin Fila, MD    Family History Family History  Problem Relation Age of Onset   Hypertension Mother    Hypertension Father     Social History Social History   Tobacco Use   Smoking status: Former Smoker   Smokeless tobacco: Never Used   Tobacco comment: quit 35 yrs ago  Substance Use Topics   Alcohol use: No   Drug use: No     Allergies   Penicillins   Review of Systems Review of Systems  Musculoskeletal:       Left leg pain  All other systems reviewed and are negative.    Physical Exam Updated Vital Signs BP 135/68    Pulse 79    Temp (!) 97.4 F (36.3 C) (Oral)    Resp 16    SpO2 93%   Physical Exam Vitals signs and nursing note reviewed.    Constitutional:      Appearance: Normal appearance.  HENT:     Head: Normocephalic and atraumatic.     Right Ear: External ear normal.     Left Ear: External ear normal.     Nose: Nose normal.     Mouth/Throat:     Mouth: Mucous membranes are moist.     Pharynx: Oropharynx is clear.  Eyes:     Extraocular Movements: Extraocular movements intact.     Conjunctiva/sclera: Conjunctivae normal.     Pupils: Pupils are equal, round, and reactive to light.  Neck:     Musculoskeletal: Normal range of motion and neck supple.  Cardiovascular:     Rate and Rhythm: Normal rate and regular rhythm.     Pulses: Normal pulses.     Heart sounds: Normal heart sounds.  Pulmonary:     Effort: Pulmonary effort is normal.     Breath sounds: Normal breath sounds.  Abdominal:     General: Abdomen is flat. Bowel sounds are normal.     Palpations: Abdomen is soft.  Musculoskeletal:     Comments: Left thigh with hematoma inner and outer thigh.  Tender to palpation.  Skin:    General: Skin is warm.     Capillary Refill: Capillary refill takes less than 2 seconds.  Neurological:     General: No focal deficit present.     Mental Status: He is alert and oriented to person, place, and time.  Psychiatric:        Mood and Affect: Mood normal.        Behavior: Behavior normal.      ED Treatments / Results  Labs (all labs ordered are listed, but only abnormal results are displayed) Labs Reviewed  BASIC METABOLIC PANEL - Abnormal; Notable for the following components:      Result Value   Glucose, Bld 132 (*)    Creatinine, Ser 1.46 (*)    Calcium 8.7 (*)    GFR calc non Af Amer 44 (*)    GFR calc Af Amer 51 (*)    All other components within normal limits  CBC WITH DIFFERENTIAL/PLATELET - Abnormal; Notable for the following components:   WBC 14.0 (*)    RBC 4.20 (*)    Hemoglobin 10.8 (*)    HCT 35.5 (*)    MCH 25.7 (*)    Neutro Abs 9.2 (*)    Abs Immature Granulocytes 0.09 (*)    All other  components within normal limits  PROTIME-INR    EKG None  Radiology Dg Chest 2 View  Result Date:  09/22/2018 CLINICAL DATA:  Fall last Saturday. History of COVID-19 positivity with cough. EXAM: CHEST - 2 VIEW COMPARISON:  07/18/2018 FINDINGS: Lungs are adequately inflated with mild hazy bibasilar opacification which may be due to atelectasis, mild vascular congestion or infection. No effusion. Cardiomediastinal silhouette and remainder of the exam is unchanged. IMPRESSION: Mild hazy bibasilar opacification which may be due to atelectasis, mild vascular congestion or infection. Electronically Signed   By: Marin Olp M.D.   On: 09/22/2018 08:32   Dg Lumbar Spine Complete  Result Date: 09/22/2018 CLINICAL DATA:  Fall last Saturday with low back and left hip/leg pain. EXAM: LUMBAR SPINE - COMPLETE 4+ VIEW COMPARISON:  None. FINDINGS: Vertebral body alignment and heights are within normal. There is mild to moderate spondylosis of the lumbar spine to include facet arthropathy over the lower lumbar spine. No evidence of acute compression fracture or spondylolisthesis. There is disc space narrowing from the L3-4 level to the L5-S1 level worse at the L4-5 level. Ankylosis of the sacroiliac joints. IMPRESSION: No acute findings. Mild-to-moderate spondylosis of the lumbar spine with multilevel disc disease worse at the L4-5 level. Symmetric ankylosis of the sacroiliac joints. Electronically Signed   By: Marin Olp M.D.   On: 09/22/2018 08:38   Ct Hip Left Wo Contrast  Result Date: 09/22/2018 CLINICAL DATA:  Left hip pain secondary to a fall. EXAM: CT OF THE LEFT HIP WITHOUT CONTRAST TECHNIQUE: Multidetector CT imaging of the left hip was performed according to the standard protocol. Multiplanar CT image reconstructions were also generated. COMPARISON:  Radiographs dated 09/22/2018 FINDINGS: Bones/Joint/Cartilage Bones of the left hip appear normal.  No joint effusion. Muscles and Tendons There is marked  enlargement of the vastus intermedius muscle in the proximal left thigh. Possibility of a mass hematoma should be considered. However, a definitive hematoma is not identified. Soft tissues Aortic atherosclerosis. Severe degenerative disc disease in the lower lumbar spine. No adenopathy. IMPRESSION: 1. No fractures. 2. Mass or hemorrhage in the left vastus intermedius muscle, incompletely visualized on the CT scan. MRI of the left thigh with and without contrast is recommended for further evaluation unless there is strong evidence that this represents a hematoma. If hematoma is clinically likely, CT scan with contrast of the left thigh would be an acceptable alternative. Electronically Signed   By: Lorriane Shire M.D.   On: 09/22/2018 10:23   Dg Hip Unilat W Or Wo Pelvis 2-3 Views Left  Result Date: 09/22/2018 CLINICAL DATA:  85 year old male with history of trauma from a fall complaining of left hip pain. EXAM: DG HIP (WITH OR WITHOUT PELVIS) 2-3V LEFT COMPARISON:  07/18/2018. FINDINGS: AP view of the pelvis and AP and lateral views of the left hip demonstrate no acute displaced fracture, subluxation or dislocation. There is joint space narrowing, subchondral sclerosis, subchondral cyst formation and osteophyte formation in the hip joints bilaterally (right greater than left), compatible with a moderate bilateral hip joint osteoarthritis. Atherosclerotic calcifications in the visualized vasculature. IMPRESSION: 1. No acute abnormality of the bony pelvis or left hip. 2. Moderate bilateral hip joint osteoarthritis (right greater than left). 3. Atherosclerosis. Electronically Signed   By: Vinnie Langton M.D.   On: 09/22/2018 08:34    Procedures Procedures (including critical care time)  Medications Ordered in ED Medications  morphine 4 MG/ML injection 4 mg (4 mg Intravenous Given 09/22/18 0835)  ondansetron (ZOFRAN) injection 4 mg (4 mg Intravenous Given 09/22/18 0834)  morphine 4 MG/ML injection 4 mg (4  mg Intravenous  Given 09/22/18 1009)     Initial Impression / Assessment and Plan / ED Course  I have reviewed the triage vital signs and the nursing notes.  Pertinent labs & imaging results that were available during my care of the patient were reviewed by me and considered in my medical decision making (see chart for details).    Pain has improved with morphine.  He has a hematoma in his left thigh.   No fx.  It was likely worsened from mowing the grass.  Pt is told to take it easy, keep leg elevated, and periodically ice it.  He is told to return if worse.  F/u with pcp.  Final Clinical Impressions(s) / ED Diagnoses   Final diagnoses:  Traumatic hematoma of thigh, left, initial encounter  On apixaban therapy    ED Discharge Orders         Ordered    HYDROcodone-acetaminophen (NORCO/VICODIN) 5-325 MG tablet  Every 4 hours PRN     09/22/18 1038           Isla Pence, MD 09/22/18 1041

## 2018-09-22 NOTE — ED Notes (Signed)
Patient transported to CT 

## 2018-09-24 DIAGNOSIS — D6869 Other thrombophilia: Secondary | ICD-10-CM | POA: Diagnosis not present

## 2018-09-24 DIAGNOSIS — S7012XD Contusion of left thigh, subsequent encounter: Secondary | ICD-10-CM | POA: Diagnosis not present

## 2018-09-24 DIAGNOSIS — E663 Overweight: Secondary | ICD-10-CM | POA: Diagnosis not present

## 2018-09-24 DIAGNOSIS — I480 Paroxysmal atrial fibrillation: Secondary | ICD-10-CM | POA: Diagnosis not present

## 2018-09-24 DIAGNOSIS — Z6826 Body mass index (BMI) 26.0-26.9, adult: Secondary | ICD-10-CM | POA: Diagnosis not present

## 2018-09-24 DIAGNOSIS — I679 Cerebrovascular disease, unspecified: Secondary | ICD-10-CM | POA: Diagnosis not present

## 2018-09-27 ENCOUNTER — Other Ambulatory Visit: Payer: Self-pay

## 2018-09-27 ENCOUNTER — Emergency Department (HOSPITAL_COMMUNITY)
Admission: EM | Admit: 2018-09-27 | Discharge: 2018-09-28 | Disposition: A | Discharge status: Home / Self Care | Payer: PPO | Attending: Emergency Medicine | Admitting: Emergency Medicine

## 2018-09-27 ENCOUNTER — Encounter (HOSPITAL_COMMUNITY): Payer: Self-pay | Admitting: Emergency Medicine

## 2018-09-27 DIAGNOSIS — I509 Heart failure, unspecified: Secondary | ICD-10-CM | POA: Diagnosis not present

## 2018-09-27 DIAGNOSIS — S8012XD Contusion of left lower leg, subsequent encounter: Secondary | ICD-10-CM | POA: Insufficient documentation

## 2018-09-27 DIAGNOSIS — Z7901 Long term (current) use of anticoagulants: Secondary | ICD-10-CM | POA: Diagnosis not present

## 2018-09-27 DIAGNOSIS — Z79899 Other long term (current) drug therapy: Secondary | ICD-10-CM | POA: Insufficient documentation

## 2018-09-27 DIAGNOSIS — Z87891 Personal history of nicotine dependence: Secondary | ICD-10-CM | POA: Insufficient documentation

## 2018-09-27 DIAGNOSIS — N183 Chronic kidney disease, stage 3 (moderate): Secondary | ICD-10-CM | POA: Diagnosis not present

## 2018-09-27 DIAGNOSIS — W19XXXD Unspecified fall, subsequent encounter: Secondary | ICD-10-CM | POA: Insufficient documentation

## 2018-09-27 DIAGNOSIS — I130 Hypertensive heart and chronic kidney disease with heart failure and stage 1 through stage 4 chronic kidney disease, or unspecified chronic kidney disease: Secondary | ICD-10-CM | POA: Diagnosis not present

## 2018-09-27 DIAGNOSIS — J45909 Unspecified asthma, uncomplicated: Secondary | ICD-10-CM | POA: Insufficient documentation

## 2018-09-27 DIAGNOSIS — R600 Localized edema: Secondary | ICD-10-CM | POA: Diagnosis not present

## 2018-09-27 DIAGNOSIS — M79605 Pain in left leg: Secondary | ICD-10-CM | POA: Diagnosis present

## 2018-09-27 NOTE — ED Triage Notes (Signed)
Pt to ED with c/o pain and swelling to left leg.  Pt was seen here several days ago and dx with a torn muscle.  Pt st's pain and swelling has increased since then

## 2018-09-28 ENCOUNTER — Emergency Department (HOSPITAL_COMMUNITY): Payer: PPO

## 2018-09-28 DIAGNOSIS — R600 Localized edema: Secondary | ICD-10-CM | POA: Diagnosis not present

## 2018-09-28 LAB — BASIC METABOLIC PANEL
Anion gap: 10 (ref 5–15)
BUN: 21 mg/dL (ref 8–23)
CO2: 24 mmol/L (ref 22–32)
Calcium: 9 mg/dL (ref 8.9–10.3)
Chloride: 107 mmol/L (ref 98–111)
Creatinine, Ser: 1.42 mg/dL — ABNORMAL HIGH (ref 0.61–1.24)
GFR calc Af Amer: 53 mL/min — ABNORMAL LOW (ref >60)
GFR calc non Af Amer: 46 mL/min — ABNORMAL LOW (ref >60)
Glucose, Bld: 97 mg/dL (ref 70–99)
Potassium: 4.2 mmol/L (ref 3.5–5.1)
Sodium: 141 mmol/L (ref 135–145)

## 2018-09-28 LAB — CBC
HCT: 31.5 % — ABNORMAL LOW (ref 39.0–52.0)
Hemoglobin: 9.8 g/dL — ABNORMAL LOW (ref 13.0–17.0)
MCH: 26.5 pg (ref 26.0–34.0)
MCHC: 31.1 g/dL (ref 30.0–36.0)
MCV: 85.1 fL (ref 80.0–100.0)
Platelets: 325 10*3/uL (ref 150–400)
RBC: 3.7 MIL/uL — ABNORMAL LOW (ref 4.22–5.81)
RDW: 15.6 % — ABNORMAL HIGH (ref 11.5–15.5)
WBC: 9.8 10*3/uL (ref 4.0–10.5)
nRBC: 0 % (ref 0.0–0.2)

## 2018-09-28 MED ORDER — IOHEXOL 300 MG/ML  SOLN
100.0000 mL | Freq: Once | INTRAMUSCULAR | Status: AC | PRN
  Administered 2018-09-28: 11:00:00 100 mL via INTRAVENOUS

## 2018-09-28 NOTE — Discharge Instructions (Signed)
CT shows minimal increase in size in the hematoma within the left thigh muscle as expected.  It is very normal for gravity to bring some of the swelling down towards the foot and ankle.  Elevating the leg or wearing compression stockings can help with the swelling.  It is also not common for some of that bruising to move down the leg as well.    Monitor the leg for worsening swelling or if the left thigh becomes tense to palpation, he begins to complain of increasing pain, numbness or difficulty moving.  If this occurs return to the emergency department, otherwise follow-up with your primary care doctor.    Hemoglobin with slight change, continue to monitor with PCP.  At this point it seems safe to continue with blood thinner.

## 2018-09-28 NOTE — ED Provider Notes (Signed)
[facility] EMERGENCY DEPARTMENT Provider Note   CSN: [id number] Arrival date & time: 09/27/18  2114     History   Chief Complaint Chief Complaint  Patient presents with   Leg Pain    HPI Nicholas [name] is a 85 y.o. male.     Nicholas [name] is a 85 y.o. male with a history of TIAs, currently on Eliquis, CHF, hypertension, CKD, asthma and arthritis, who presents to the ED for worsening swelling to the left leg.  Patient was seen in the emergency department on 9/13 and diagnosed with a hematoma of the left thigh, thought to be related to muscle injury from a fall the previous week.  Patient's grandson is at the bedside whom he lives with, grandson reports that they followed up a few days later with PCP, at that time he had slightly increased bruising.  PCP advised them to keep a close eye on the leg and monitor for worsening swelling, they wanted to keep the patient on his Eliquis if possible given high risk for stroke.  Over the past week the son has noted that the swelling has progressed down the leg into the foot and ankle and bruising has also progressed down the leg a bit.  Patient reports that he still has pain and soreness over the left medial thigh but pain has not worsened significantly, he denies numbness tingling or weakness.  He continues to be ambulatory with slight limp per grandson. No additional falls or trauma.     Past Medical History:  Diagnosis Date   Anemia    none recent   Arthritis    Asthma    Chronic kidney disease    ckd stage 3   Family history of adverse reaction to anesthesia    daughter has ponv    Glaucoma    Hypertension     Patient Active Problem List   Diagnosis Date Noted   Pneumonia due to COVID-19 virus 07/19/2018   COVID-19 virus infection 07/18/2018   Renal insufficiency 11/27/2017   Hyperlipidemia 11/02/2017   TIA (transient ischemic attack) 11/01/2017   CHF (congestive heart failure) ([place]) 11/01/2017     HTN (hypertension) 11/01/2017    Past Surgical History:  Procedure Laterality Date   COLONOSCOPY WITH PROPOFOL N/A 02/03/2015   Procedure: COLONOSCOPY WITH PROPOFOL;  Surgeon: Arta Silence, MD;  Location: [facility];  Service: Endoscopy;  Laterality: N/A;   KNEE ARTHROSCOPY Left    ROTATOR CUFF REPAIR Right    SHOULDER ARTHROSCOPY Left         Home Medications    Prior to Admission medications   Medication Sig Start Date End Date Taking? Authorizing Provider  acetaminophen (TYLENOL) 500 MG tablet Take 500-1,000 mg by mouth every 8 (eight) hours as needed (for pain or headaches).     [provider]  albuterol (PROVENTIL HFA;VENTOLIN HFA) 108 (90 Base) MCG/ACT inhaler Inhale 2 puffs into the lungs every 4 (four) hours as needed.     [provider]  allopurinol (ZYLOPRIM) 100 MG tablet Take 100 mg by mouth daily.    [provider]  apixaban (ELIQUIS) 2.5 MG TABS tablet Take 1 tablet (2.5 mg total) by mouth 2 (two) times daily. 07/21/18   Neva Seat, MD  bisoprolol (ZEBETA) 5 MG tablet Take 2.5 mg by mouth every morning.     [provider]  Calcium Carb-Cholecalciferol (CALCIUM 600+D3 PO) Take 2 tablets by mouth daily.    [provider]  Fluticasone-Salmeterol (ADVAIR) 250-50 MCG/DOSE AEPB Inhale 1 puff into the lungs 2 (two) times daily.    [provider]  HYDROcodone-acetaminophen (NORCO/VICODIN) 5-325 MG tablet Take 1 tablet by mouth every 4 (four) hours as needed. 09/22/18   Isla Pence, MD  latanoprost (XALATAN) 0.005 % ophthalmic solution Place 1 drop into both eyes at bedtime. 07/26/17   [provider]  Multiple Vitamin (MULTIVITAMIN WITH MINERALS) TABS tablet Take 1 tablet by mouth every morning.    [provider]  Omega-3 Fatty Acids (FISH OIL) 1000 MG CAPS Take 1,000 mg by mouth daily.    [provider]  pantoprazole (PROTONIX) 20 MG tablet Take 1 tablet (20 mg total) by  mouth daily. 04/01/18   Garvin Fila, MD    Family History Family History  Problem Relation Age of Onset   Hypertension Mother    Hypertension Father     Social History Social History   Tobacco Use   Smoking status: Former Smoker   Smokeless tobacco: Never Used   Tobacco comment: quit 35 yrs ago  Substance Use Topics   Alcohol use: No   Drug use: No     Allergies   Penicillins   Review of Systems Review of Systems  Constitutional: Negative for chills and fever.  Respiratory: Negative for cough and shortness of breath.   Cardiovascular: Positive for leg swelling. Negative for chest pain.  Gastrointestinal: Negative for abdominal pain, nausea and vomiting.  Musculoskeletal: Positive for arthralgias and myalgias.  Skin: Positive for color change.  Neurological: Negative for weakness and numbness.     Physical Exam Updated Vital Signs BP (!) 132/59 (BP Location: Left Arm)    Pulse 79    Temp 98.5 F (36.9 C) (Oral)    Resp 20    Ht 6' (1.829 m)    Wt 84.8 kg    SpO2 96%    BMI 25.36 kg/m   Physical Exam Vitals signs and nursing note reviewed.  Constitutional:      General: He is not in acute distress.    Appearance: Normal appearance. He is well-developed and normal weight. He is not diaphoretic.  HENT:     Head: Normocephalic and atraumatic.  Eyes:     General:        Right eye: No discharge.        Left eye: No discharge.  Cardiovascular:     Rate and Rhythm: Normal rate and regular rhythm.     Heart sounds: Normal heart sounds.  Pulmonary:     Effort: Pulmonary effort is normal. No respiratory distress.     Breath sounds: Normal breath sounds.     Comments: Respirations equal and unlabored, patient able to speak in full sentences, lungs clear to auscultation bilaterally Abdominal:     General: Abdomen is flat. Bowel sounds are normal.     Palpations: Abdomen is soft.     Tenderness: There is no abdominal tenderness.  Musculoskeletal:      Comments: Left leg with large bruise noted over the inner thigh with palpable tenderness, compartment is soft, not warm to the touch, no overlying erythema or cellulitis.  There is some swelling extending into the calf and ankle with slight bruising as well, distal pulses 2+, lower extremity warm and well perfused.  Normal sensation and strength.  Skin:    General: Skin is warm and dry.  Neurological:     Mental Status: He is alert and oriented to person, place, and time.  Coordination: Coordination normal.  Psychiatric:        Mood and Affect: Mood normal.        Behavior: Behavior normal.      ED Treatments / Results  Labs (all labs ordered are listed, but only abnormal results are displayed) Labs Reviewed  CBC - Abnormal; Notable for the following components:      Result Value   RBC 3.70 (*)    Hemoglobin 9.8 (*)    HCT 31.5 (*)    RDW 15.6 (*)    All other components within normal limits  BASIC METABOLIC PANEL - Abnormal; Notable for the following components:   Creatinine, Ser 1.42 (*)    GFR calc non Af Amer 46 (*)    GFR calc Af Amer 53 (*)    All other components within normal limits    EKG None  Radiology Ct Tibia Fibula Left W Contrast  Result Date: 09/28/2018 CLINICAL DATA:  Persistent left thigh swelling which has worsened compared with 09/22/2018. EXAM: CT OF THE LOWER RIGHT FEMUR WITH CONTRAST CT OF THE LOWER RIGHT TIBIA/FIBULA WITH CONTRAST TECHNIQUE: Multidetector CT imaging of the lower right extremity was performed according to the standard protocol following intravenous contrast administration. COMPARISON:  09/22/2018 CONTRAST:  177mL OMNIPAQUE IOHEXOL 300 MG/ML  SOLN FINDINGS: Bones/Joint/Cartilage No fracture or dislocation. Normal alignment. No joint effusion. No periosteal reaction or bone destruction. No aggressive osseous lesion. Degenerative disease with disc height loss at L4-5 and L5-S1 with bilateral facet arthropathy. Mild tricompartmental joint  space narrowing of left knee consistent with osteoarthritis. Ankle joint spaces are maintained. Subtalar joints are normal. Ligaments Ligaments are suboptimally evaluated by CT. Muscles and Tendons Ill-defined expansion of the vastus medialis muscle most consistent with an intramuscular hematoma which appears similar in appearance compared with 09/22/2018. Remainder the muscles demonstrate no focal abnormality. Quadriceps and patellar tendon are intact. Flexor, extensor, peroneal and Achilles tendons are grossly intact. Soft tissue No fluid collection or hematoma. No soft tissue mass. Soft tissue edema along the lateral aspect of the left thigh, circumferentially around the left lower leg, ankle and foot. Peripheral vascular atherosclerotic disease. Diverticulosis without evidence of diverticulitis. IMPRESSION: 1.  No acute osseous injury of the left femur. 2.  No acute osseous injury of the left tibia and fibula. 3. Stable expansion of the left vastus medialis muscle most consistent with an intramuscular hematoma. The dimensions of the hematoma are difficult to delineate on the CT scan. If there is further need for characterization, recommend an MRI of the left femur. 4. Nonspecific soft tissue edema along the lateral aspect of the left thigh and circumferentially around the left lower leg, ankle and foot likely reflecting reactive edema. Electronically Signed   By: Kathreen Devoid   On: 09/28/2018 12:10   Ct Femur Left W Contrast  Result Date: 09/28/2018 CLINICAL DATA:  Persistent left thigh swelling which has worsened compared with 09/22/2018. EXAM: CT OF THE LOWER RIGHT FEMUR WITH CONTRAST CT OF THE LOWER RIGHT TIBIA/FIBULA WITH CONTRAST TECHNIQUE: Multidetector CT imaging of the lower right extremity was performed according to the standard protocol following intravenous contrast administration. COMPARISON:  09/22/2018 CONTRAST:  115mL OMNIPAQUE IOHEXOL 300 MG/ML  SOLN FINDINGS: Bones/Joint/Cartilage No  fracture or dislocation. Normal alignment. No joint effusion. No periosteal reaction or bone destruction. No aggressive osseous lesion. Degenerative disease with disc height loss at L4-5 and L5-S1 with bilateral facet arthropathy. Mild tricompartmental joint space narrowing of left knee consistent with osteoarthritis. Ankle joint spaces  are maintained. Subtalar joints are normal. Ligaments Ligaments are suboptimally evaluated by CT. Muscles and Tendons Ill-defined expansion of the vastus medialis muscle most consistent with an intramuscular hematoma which appears similar in appearance compared with 09/22/2018. Remainder the muscles demonstrate no focal abnormality. Quadriceps and patellar tendon are intact. Flexor, extensor, peroneal and Achilles tendons are grossly intact. Soft tissue No fluid collection or hematoma. No soft tissue mass. Soft tissue edema along the lateral aspect of the left thigh, circumferentially around the left lower leg, ankle and foot. Peripheral vascular atherosclerotic disease. Diverticulosis without evidence of diverticulitis. IMPRESSION: 1.  No acute osseous injury of the left femur. 2.  No acute osseous injury of the left tibia and fibula. 3. Stable expansion of the left vastus medialis muscle most consistent with an intramuscular hematoma. The dimensions of the hematoma are difficult to delineate on the CT scan. If there is further need for characterization, recommend an MRI of the left femur. 4. Nonspecific soft tissue edema along the lateral aspect of the left thigh and circumferentially around the left lower leg, ankle and foot likely reflecting reactive edema. Electronically Signed   By: Kathreen Devoid   On: 09/28/2018 12:10    Procedures Procedures (including critical care time)  Medications Ordered in ED Medications  iohexol (OMNIPAQUE) 300 MG/ML solution 100 mL (100 mLs Intravenous Contrast Given 09/28/18 1119)     Initial Impression / Assessment and Plan / ED Course  I  have reviewed the triage vital signs and the nursing notes.  Pertinent labs & imaging results that were available during my care of the patient were reviewed by me and considered in my medical decision making (see chart for details).  85 year old male recently diagnosed with hematoma left posterior thigh muscle returning today due to increasing swelling.  Reports pain is present but not significantly worsened.  He has not had any fevers, normal vitals here today.  Family was concerned due to swelling progressing down the leg especially that since patient is on Eliquis.  On exam the thigh is soft to palpation, distal pulses intact and normal sensation and strength without signs for compartment syndrome, I suspect that this is likely reactive edema traveling down the leg due to swelling but I have low suspicion for continued active bleeding within the muscle.  Given patient is on blood thinners will check basic labs to assess hemoglobin.  Hemoglobin has dropped one-point since 6 days previous when he was seen.  Creatinine is at baseline, 1.42 with GFR of 46.  Had shared decision-making discussion with patient and son, given 1 point drop in hemoglobin they would like to get imaging to ensure there is no significant increase in hematoma, during previous visit on 9/13 if hematoma was suspected they recommended getting CT with contrast to better identify.  Will get CT with contrast of the left lower extremity.  CT shows stable and expected expansion of the left vastus medialis muscle most consistent with intramuscular hematoma dimensions are difficult to delineate but it is not significantly changed when compared to previous CT.  There is some nonspecific soft tissue edema in the thigh and circumferentially around the lower leg and ankle, likely reactive edema from intramuscular hematoma.  This reactive edema is to be expected, and I am reassured that there is not significant increase in size of hematoma,  discussed these results with patient and son, encouraged elevation of the leg or compression stocking to help with swelling and close follow-up with PCP.  Gave patient strict  return precautions to watch out for like increasing swelling specifically over the thigh if it becomes tense or increasingly painful, changes in sensation or discoloration of the leg.  They expressed understanding and agreement with plan.  Discharged home in good condition.  Patient discussed with Dr. Sedonia Small, who saw patient as well and agrees with plan.   Final Clinical Impressions(s) / ED Diagnoses   Final diagnoses:  Hematoma of leg, left, subsequent encounter    ED Discharge Orders    None       Jacqlyn Larsen, [place] 09/28/18 1648    Maudie Flakes, MD 09/30/18 [phone]

## 2018-10-07 DIAGNOSIS — N179 Acute kidney failure, unspecified: Secondary | ICD-10-CM | POA: Diagnosis not present

## 2018-10-07 DIAGNOSIS — Z8709 Personal history of other diseases of the respiratory system: Secondary | ICD-10-CM | POA: Diagnosis not present

## 2018-10-07 DIAGNOSIS — N183 Chronic kidney disease, stage 3 (moderate): Secondary | ICD-10-CM | POA: Diagnosis not present

## 2018-10-07 DIAGNOSIS — N2581 Secondary hyperparathyroidism of renal origin: Secondary | ICD-10-CM | POA: Diagnosis not present

## 2018-10-07 DIAGNOSIS — E785 Hyperlipidemia, unspecified: Secondary | ICD-10-CM | POA: Diagnosis not present

## 2018-10-07 DIAGNOSIS — G459 Transient cerebral ischemic attack, unspecified: Secondary | ICD-10-CM | POA: Diagnosis not present

## 2018-10-07 DIAGNOSIS — I5030 Unspecified diastolic (congestive) heart failure: Secondary | ICD-10-CM | POA: Diagnosis not present

## 2018-10-07 DIAGNOSIS — N189 Chronic kidney disease, unspecified: Secondary | ICD-10-CM | POA: Diagnosis not present

## 2018-10-07 DIAGNOSIS — D631 Anemia in chronic kidney disease: Secondary | ICD-10-CM | POA: Diagnosis not present

## 2018-11-20 DIAGNOSIS — E785 Hyperlipidemia, unspecified: Secondary | ICD-10-CM | POA: Diagnosis not present

## 2018-11-20 DIAGNOSIS — N189 Chronic kidney disease, unspecified: Secondary | ICD-10-CM | POA: Diagnosis not present

## 2018-11-20 DIAGNOSIS — I5022 Chronic systolic (congestive) heart failure: Secondary | ICD-10-CM | POA: Diagnosis not present

## 2018-11-20 DIAGNOSIS — I10 Essential (primary) hypertension: Secondary | ICD-10-CM | POA: Diagnosis not present

## 2018-11-20 DIAGNOSIS — I480 Paroxysmal atrial fibrillation: Secondary | ICD-10-CM | POA: Diagnosis not present

## 2018-11-20 DIAGNOSIS — I679 Cerebrovascular disease, unspecified: Secondary | ICD-10-CM | POA: Diagnosis not present

## 2018-11-20 DIAGNOSIS — I4510 Unspecified right bundle-branch block: Secondary | ICD-10-CM | POA: Diagnosis not present

## 2018-11-20 DIAGNOSIS — Q049 Congenital malformation of brain, unspecified: Secondary | ICD-10-CM | POA: Diagnosis not present

## 2018-11-20 DIAGNOSIS — D6869 Other thrombophilia: Secondary | ICD-10-CM | POA: Diagnosis not present

## 2018-11-20 DIAGNOSIS — R739 Hyperglycemia, unspecified: Secondary | ICD-10-CM | POA: Diagnosis not present

## 2018-11-20 DIAGNOSIS — Z0000 Encounter for general adult medical examination without abnormal findings: Secondary | ICD-10-CM | POA: Diagnosis not present

## 2018-11-20 DIAGNOSIS — I259 Chronic ischemic heart disease, unspecified: Secondary | ICD-10-CM | POA: Diagnosis not present

## 2018-11-28 ENCOUNTER — Encounter: Payer: Self-pay | Admitting: Adult Health

## 2018-11-28 ENCOUNTER — Ambulatory Visit: Payer: PPO | Admitting: Adult Health

## 2018-11-28 ENCOUNTER — Other Ambulatory Visit: Payer: Self-pay

## 2018-11-28 VITALS — BP 140/68 | HR 56 | Temp 97.4°F | Ht 69.0 in | Wt 193.0 lb

## 2018-11-28 DIAGNOSIS — I480 Paroxysmal atrial fibrillation: Secondary | ICD-10-CM

## 2018-11-28 DIAGNOSIS — G459 Transient cerebral ischemic attack, unspecified: Secondary | ICD-10-CM

## 2018-11-28 DIAGNOSIS — I10 Essential (primary) hypertension: Secondary | ICD-10-CM | POA: Diagnosis not present

## 2018-11-28 DIAGNOSIS — E785 Hyperlipidemia, unspecified: Secondary | ICD-10-CM | POA: Diagnosis not present

## 2018-11-28 NOTE — Patient Instructions (Signed)
Continue Eliquis (apixaban) daily  and omega-3 for secondary stroke prevention  Continue to follow up with PCP regarding cholesterol and blood pressure management   Continue to follow with cardiology for atrial fibrillation and Eliquis management  Continue to monitor blood pressure at home  Maintain strict control of hypertension with blood pressure goal below 130/90, diabetes with hemoglobin A1c goal below 6.5% and cholesterol with LDL cholesterol (bad cholesterol) goal below 70 mg/dL. I also advised the patient to eat a healthy diet with plenty of whole grains, cereals, fruits and vegetables, exercise regularly and maintain ideal body weight.         Thank you for coming to see [place] at [facility]. I hope we have been able to provide you high quality care today.  You may receive a patient satisfaction survey over the next few weeks. We would appreciate your feedback and comments so that we may continue to improve ourselves and the health of our patients.

## 2018-11-28 NOTE — Progress Notes (Signed)
[facility] [address] [place]. [place] [address] [phone]       OFFICE FOLLOW UP NOTE  [name] Date of Birth:  [birth year] Medical Record Number:  [id number]   Referring MD:   Christa See  Reason for Referral: TIA  Chief Complaint  Patient presents with   Transient Ischemic Attack    3 month FU, grandson- Richard, "doing well, did have 1 fall w/hematoma behind knee"     HPI:   Update 11/28/2018: [name] is a 85 year old male who is being seen today for stroke follow-up accompanied by his grandson.  He has been stable from a stroke standpoint since prior visit without new or reoccurring stroke/TIA symptoms.  He continues on Eliquis for atrial fibrillation and secondary stroke prevention without side effects of bleeding or bruising.  Blood pressure today 140/68.  No further concerns at this time.  08/28/2018 update: [name] is a 85 year old male who is being seen today for stroke follow-up accompanied by his daughter.  He did present to the ED on 07/15/2018 with complaints of feeling off balance and dizziness with intermittent headache over 2 to 3 days.  Initially presented to urgent care who advised to be evaluated in ER.  MRI negative for acute findings.  Lab work unremarkable.  Discharged home in stable condition.  He returned on 07/18/2018 after a fall with expressive aphasia.  He was found to have new onset A. fib.  Neurology consulted with the stroke work-up completed with diagnosis of probable TIA without acute findings on MRI.  MRA and 2D echo unremarkable.  Recommended initiating Eliquis due to new onset atrial fibrillation.  COVID testing positive though asymptomatic.  HTN and HLD stable.  He was discharged home with recommendations of home health PT/OT.  He has been doing well since discharge without recurring stroke/TIA symptoms.  He continues on Eliquis without bleeding or bruising.  Plans on scheduling follow-up visit with previously  established cardiologist as prior visit canceled per daughter.  Blood pressure today 143/58.  No further concerns at this time.    Initial visit 12/10/2017 PS: [name] is a pleasant 85 year old Caucasian male seen today for initial office consultation visit for dizziness and TIA.  He is accompanied by his grandson Nicholas [name] and history is obtained from them and review of electronic medical records.  I personally reviewed imaging films.  He is 85 year old male with past medical history for hypertension, asthma, chronic kidney disease and recent diagnosis of congestive heart failure who presented on 11/01/2017 to [facility] emergency room with complaints of severe headache, dizziness, transient right hand weakness with accompanying multifocal symptoms of worsening fatigue, shortness of breath, increased pedal edema and worsening kidney function.  He has been taking diuretics and blood pressure medications for elevated blood pressure.  He had previously been seen at urgent care in [place] a week ago with somewhat similar but less prominent symptoms.  He was diagnosed as congestive heart failure at that time and started on diuretic and blood pressure medication.  On exam he was found to have congestive heart failure but he did undergo an MRI scan of the brain which I personally reviewed on 11/01/2017 which was unremarkable.  CT scan of the head was also unremarkable.  Carotid ultrasound showed no significant extracranial stenosis.  Transthoracic echo showed normal ejection fraction but abnormal left ventricular relaxation consistent with grade 1 diastolic dysfunction.  The patient states that for several hours that they was off balance dizzy walking  like a drunk and he had transient right hand weakness for little while.  He denies any other history of stroke or TIAs in the past.  He denies any accompanying diplopia or loss of vision but did complain of mild nausea and vomiting.  There is no history of migraine  headache seizures or loss of consciousness.  Since discharge from the ER patient has not had any further episodes of dizziness right hand weakness.  He was previously on aspirin 81 mg the dose of which was increased to 325 mg.  He states his blood pressure has been doing better.  Patient is living with his grandson.  He is independent in activities of daily living.  Is significantly hard of hearing.  He states that he had a somewhat similar episode in March 2019 when he developed episode of dizziness with cold clammy skin and sweating.  He was seen at [facility] and evaluated by tele-stroke neurologist.  MRI scan of the brain at that time was also negative.  Patient has not had CT angiogram or MR angiograms done during any of his these 3 ER visits.       ROS:   14 system review of systems is positive for no concerns only and all other systems negative  PMH:  Past Medical History:  Diagnosis Date   Anemia    none recent   Arthritis    Asthma    Chronic kidney disease    ckd stage 3   Family history of adverse reaction to anesthesia    daughter has ponv    Hypertension Hyperlipidemia Congestive heart failure     Social History:  Social History   Socioeconomic History   Marital status: Married    Spouse name: Not on file   Number of children: 5   Years of education: 8   Highest education level: Not on file  Occupational History   Occupation: Retired   Scientist, product/process development strain: Not on file   Food insecurity    Worry: Not on file    Inability: Not on Lexicographer needs    Medical: Not on file    Non-medical: Not on file  Tobacco Use   Smoking status: Former Smoker   Smokeless tobacco: Never Used   Tobacco comment: quit 35 yrs ago  Substance and Sexual Activity   Alcohol use: No   Drug use: No   Sexual activity: Not on file  Lifestyle   Physical activity    Days per week: Not on file    Minutes per session: Not on  file   Stress: Not on file  Relationships   Social connections    Talks on phone: Not on file    Gets together: Not on file    Attends religious service: Not on file    Active member of club or organization: Not on file    Attends meetings of clubs or organizations: Not on file    Relationship status: Not on file   Intimate partner violence    Fear of current or ex partner: Not on file    Emotionally abused: Not on file    Physically abused: Not on file    Forced sexual activity: Not on file  Other Topics Concern   Not on file  Social History Narrative   11/28/18 Lives with grandson, [name]    Caffeine use: daily   Right handed     Medications:   Current Outpatient Medications  on File Prior to Visit  Medication Sig Dispense Refill   acetaminophen (TYLENOL) 500 MG tablet Take 500-1,000 mg by mouth every 8 (eight) hours as needed (for pain or headaches).      albuterol (PROVENTIL HFA;VENTOLIN HFA) 108 (90 Base) MCG/ACT inhaler Inhale 2 puffs into the lungs every 4 (four) hours as needed.      allopurinol (ZYLOPRIM) 100 MG tablet Take 100 mg by mouth daily.     apixaban (ELIQUIS) 2.5 MG TABS tablet Take 1 tablet (2.5 mg total) by mouth 2 (two) times daily. 60 tablet 0   bisoprolol (ZEBETA) 5 MG tablet Take 2.5 mg by mouth every morning.      Calcium Carb-Cholecalciferol (CALCIUM 600+D3 PO) Take 2 tablets by mouth daily.     Fluticasone-Salmeterol (ADVAIR) 250-50 MCG/DOSE AEPB Inhale 1 puff into the lungs 2 (two) times daily.     latanoprost (XALATAN) 0.005 % ophthalmic solution Place 1 drop into both eyes at bedtime.     Multiple Vitamin (MULTIVITAMIN WITH MINERALS) TABS tablet Take 1 tablet by mouth every morning.     Omega-3 Fatty Acids (FISH OIL) 1000 MG CAPS Take 1,000 mg by mouth daily.     pantoprazole (PROTONIX) 20 MG tablet Take 1 tablet (20 mg total) by mouth daily. 30 tablet 3   No current facility-administered medications on file prior to visit.      Allergies:   Allergies  Allergen Reactions   Penicillins Hives    Childhood allergy Has patient had a PCN reaction causing immediate rash, facial/tongue/throat swelling, SOB or lightheadedness with hypotension: Yes Has patient had a PCN reaction causing severe rash involving mucus membranes or skin necrosis: Yes Has patient had a PCN reaction that required hospitalization: Yes Has patient had a PCN reaction occurring within the last 10 years: No If all of the above answers are "NO", then may proceed with Cephalosporin use.     Physical Exam  Today's Vitals   11/28/18 0952  BP: 140/68  Pulse: (!) 56  Temp: (!) 97.4 F (36.3 C)  Weight: 193 lb (87.5 kg)  Height: 5\' 9"  (1.753 m)   Body mass index is 28.5 kg/m.   General: frail pleasant elderly caucasian male, seated, in no evident distress Head: head normocephalic and atraumatic.   Neck: supple with no carotid or supraclavicular bruits Cardiovascular: regular rate and rhythm, no murmurs Musculoskeletal: no deformity Skin:  no rash/petichiae Vascular:  Normal pulses all extremities  Neurologic Exam Mental Status: Awake and fully alert. Oriented to place and time. Recent and remote memory intact. Attention span, concentration and fund of knowledge appropriate. Mood and affect appropriate.  Cranial Nerves: Pupils equal, briskly reactive to light. Extraocular movements full without nystagmus. Visual fields full to confrontation. Hearing significantly diminished bilaterally. Facial sensation intact. Face, tongue, palate moves normally and symmetrically.  Motor: Normal bulk and tone. Normal strength in all tested extremity muscles. Sensory.: intact to touch , pinprick , position and vibratory sensation.  Coordination: Rapid alternating movements normal in all extremities. Finger-to-nose and heel-to-shin performed accurately bilaterally. Gait and Station: Arises from chair without difficulty. Stance is normal. Gait demonstrates  normal stride length and balance .  Reflexes: 1+ and symmetric. Toes downgoing.          IMAGING:  Dg Elbow Complete Right 07/18/2018 IMPRESSION:  No acute abnormality.   Ct Head Wo Contrast 07/18/2018 IMPRESSION:  1. No acute intracranial pathology.   Ct Head Wo Contrast 07/15/2018 IMPRESSION:  1.  No acute intracranial pathology.  2. 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    Mr Brain Wo Contrast 07/16/2018 IMPRESSION:  1. No acute intracranial abnormality or significant interval change. No focal etiology to explain ataxia.  2. Stable atrophy and white matter disease.  3. Mega cisterna magna again noted.  4. Acute sinusitis.   Dg Chest Port 1 View  07/18/2018 IMPRESSION:  1. Cardiomegaly with mild vascular congestion.  2. Rounded airspace opacity at the right lung base, not well appreciated on prior exam. Attention on follow-up examinations is recommended.   Dg Chest Port 1 View 07/16/2018 IMPRESSION:  1. Mild pulmonary vascular congestion and low lung volumes.  2. Aortic atherosclerosis.   Dg Hip Unilat W Or Wo Pelvis 2-3 Views Left 07/18/2018 IMPRESSION:  No acute abnormality.    Transthoracic Echocardiogram  07/20/2018 IMPRESSIONS 1. The left ventricle has normal systolic function, with an ejection fraction of 55-60%. The cavity size was normal. Left ventricular diastolic Doppler parameters are indeterminate. 2. The mitral valve is abnormal. Mild thickening of the mitral valve leaflet. There is mild mitral annular calcification present. 3. The aortic valve was not well visualized. No stenosis of the aortic valve. 4. The interatrial septum was not assessed.     ASSESSMENT: 85 year old Caucasian male with episodes of transient dizziness, gait ataxia and headache possibly vertebrobasilar TIAs in 10/2017 and 07/2018 with recent admission finding new onset A. fib therefore initiated Eliquis.  Vascular risk factors of new onset  A. fib, hypertension, hyperlipidemia, CHF and age.  He also had positive COVID-19 testing on 07/18/2018 asymptomatic.  Continues to do well from a neurological standpoint without residual deficits or reoccurring symptoms     PLAN: 1. TIA: Continue Eliquis (apixaban) daily  and omega-3 for secondary stroke prevention. Maintain strict control of hypertension with blood pressure goal below 130/90, diabetes with hemoglobin A1c goal below 6.5% and cholesterol with LDL cholesterol (bad cholesterol) goal below 70 mg/dL.  I also advised the patient to eat a healthy diet with plenty of whole grains, cereals, fruits and vegetables, exercise regularly with at least 30 minutes of continuous activity daily and maintain ideal body weight. 2. HTN: Advised to continue current treatment regimen.  Today's BP 140/68.  Advised to continue to monitor at home along with continued follow-up with PCP for management 3. HLD: Advised to continue current treatment regimen along with continued follow-up with PCP for future prescribing and monitoring of lipid panel 4. Atrial fibrillation: Continue Eliquis and ongoing follow-up with cardiology.   Continues to be stable from a stroke standpoint recommend follow-up as needed   Greater than 50% of time during this 25 minute visit was spent on counseling,explanation of diagnosis of recurrent TIAs, reviewing risk factor management of atrial fibrillation, HTN and HLD, planning of further management, discussion with patient and family and coordination of care  Frann Rider, [facility]  [facility] [address] [place], [place] [address]  Phone [phone] Fax [phone] Note: This document was prepared with digital dictation and possible smart phrase technology. Any transcriptional errors that result from this process are unintentional.

## 2018-11-29 NOTE — Progress Notes (Signed)
I agree with the above plan 

## 2018-12-13 ENCOUNTER — Other Ambulatory Visit: Payer: Self-pay

## 2018-12-13 ENCOUNTER — Encounter (HOSPITAL_COMMUNITY): Payer: Self-pay | Admitting: Emergency Medicine

## 2018-12-13 ENCOUNTER — Emergency Department (HOSPITAL_COMMUNITY)
Admission: EM | Admit: 2018-12-13 | Discharge: 2018-12-14 | Disposition: A | Discharge status: Home / Self Care | Payer: PPO | Attending: Emergency Medicine | Admitting: Emergency Medicine

## 2018-12-13 ENCOUNTER — Emergency Department (HOSPITAL_COMMUNITY): Payer: PPO

## 2018-12-13 DIAGNOSIS — Z87891 Personal history of nicotine dependence: Secondary | ICD-10-CM | POA: Diagnosis not present

## 2018-12-13 DIAGNOSIS — N183 Chronic kidney disease, stage 3 unspecified: Secondary | ICD-10-CM | POA: Diagnosis not present

## 2018-12-13 DIAGNOSIS — I509 Heart failure, unspecified: Secondary | ICD-10-CM | POA: Diagnosis not present

## 2018-12-13 DIAGNOSIS — G4489 Other headache syndrome: Secondary | ICD-10-CM | POA: Diagnosis not present

## 2018-12-13 DIAGNOSIS — I130 Hypertensive heart and chronic kidney disease with heart failure and stage 1 through stage 4 chronic kidney disease, or unspecified chronic kidney disease: Secondary | ICD-10-CM | POA: Insufficient documentation

## 2018-12-13 DIAGNOSIS — Z79899 Other long term (current) drug therapy: Secondary | ICD-10-CM | POA: Insufficient documentation

## 2018-12-13 DIAGNOSIS — R519 Headache, unspecified: Secondary | ICD-10-CM | POA: Diagnosis not present

## 2018-12-13 NOTE — ED Triage Notes (Signed)
Pt reports woke this morning with severe headache. No neuro deficits noted. Hx of prev stroke. VSS. NAD at present. Pt also c/o dry heaves. No vomiting noted.

## 2018-12-14 NOTE — ED Provider Notes (Signed)
[facility] EMERGENCY DEPARTMENT Provider Note   CSN: [id number] Arrival date & time: 12/13/18  1234     History   Chief Complaint Chief Complaint  Patient presents with  . Headache    HPI Nicholas [name] is a 85 y.o. male.     The history is provided by the patient and a relative.  Headache Pain location:  Generalized Onset quality:  Gradual Timing:  Constant Progression:  Resolved Chronicity:  New Relieved by:  Nothing Worsened by:  Nothing Associated symptoms: nausea   Associated symptoms: no blurred vision, no fever, no focal weakness, no numbness, no visual change, no vomiting and no weakness    Patient with history of paroxysmal atrial fibrillation on Eliquis, history of previous stroke presents with headache.  Patient reports he woke up with a headache and had dry heaving.  No fevers or vomiting.  No focal weakness.  No slurred speech.  His son took him to his PCP who directed him to an urgent care.  Urgent care advised him to go to the ER for CT imaging.  Patient does have a history of TIA/CVA per son.  They report that his TIAs are typically preceded by a headache and they were concerned this was a sign of an impending TIA Patient is on Eliquis and no missed dosing Past Medical History:  Diagnosis Date  . Anemia    none recent  . Arthritis   . Asthma   . Chronic kidney disease    ckd stage 3  . Family history of adverse reaction to anesthesia    daughter has ponv   . Glaucoma   . Hypertension     Patient Active Problem List   Diagnosis Date Noted  . Pneumonia due to COVID-19 virus 07/19/2018  . COVID-19 virus infection 07/18/2018  . Renal insufficiency 11/27/2017  . Hyperlipidemia 11/02/2017  . TIA (transient ischemic attack) 11/01/2017  . CHF (congestive heart failure) ([place]) 11/01/2017  . HTN (hypertension) 11/01/2017    Past Surgical History:  Procedure Laterality Date  . COLONOSCOPY WITH PROPOFOL N/A 02/03/2015   Procedure:  COLONOSCOPY WITH PROPOFOL;  Surgeon: Arta Silence, MD;  Location: [facility] ENDOSCOPY;  Service: Endoscopy;  Laterality: N/A;  . KNEE ARTHROSCOPY Left   . ROTATOR CUFF REPAIR Right   . SHOULDER ARTHROSCOPY Left         Home Medications    Prior to Admission medications   Medication Sig Start Date End Date Taking? Authorizing Provider  acetaminophen (TYLENOL) 500 MG tablet Take 500-1,000 mg by mouth every 8 (eight) hours as needed (for pain or headaches).     [provider]  albuterol (PROVENTIL HFA;VENTOLIN HFA) 108 (90 Base) MCG/ACT inhaler Inhale 2 puffs into the lungs every 4 (four) hours as needed.     [provider]  allopurinol (ZYLOPRIM) 100 MG tablet Take 100 mg by mouth daily.    [provider]  apixaban (ELIQUIS) 2.5 MG TABS tablet Take 1 tablet (2.5 mg total) by mouth 2 (two) times daily. 07/21/18   Neva Seat, MD  bisoprolol (ZEBETA) 5 MG tablet Take 2.5 mg by mouth every morning.     [provider]  Calcium Carb-Cholecalciferol (CALCIUM 600+D3 PO) Take 2 tablets by mouth daily.    [provider]  Fluticasone-Salmeterol (ADVAIR) 250-50 MCG/DOSE AEPB Inhale 1 puff into the lungs 2 (two) times daily.    [provider]  latanoprost (XALATAN) 0.005 % ophthalmic solution Place 1 drop into both eyes  at bedtime. 07/26/17   [provider]  Multiple Vitamin (MULTIVITAMIN WITH MINERALS) TABS tablet Take 1 tablet by mouth every morning.    [provider]  Omega-3 Fatty Acids (FISH OIL) 1000 MG CAPS Take 1,000 mg by mouth daily.    [provider]  pantoprazole (PROTONIX) 20 MG tablet Take 1 tablet (20 mg total) by mouth daily. 04/01/18   Garvin Fila, MD    Family History Family History  Problem Relation Age of Onset  . Hypertension Mother   . Hypertension Father     Social History Social History   Tobacco Use  . Smoking status: Former Research scientist (life sciences)  . Smokeless tobacco: Never Used  . Tobacco  comment: quit 35 yrs ago  Substance Use Topics  . Alcohol use: No  . Drug use: No     Allergies   Penicillins   Review of Systems Review of Systems  Constitutional: Negative for fever.  Eyes: Negative for blurred vision and visual disturbance.  Gastrointestinal: Positive for nausea. Negative for vomiting.  Neurological: Positive for headaches. Negative for focal weakness, speech difficulty, weakness and numbness.  Psychiatric/Behavioral: Negative for confusion.  All other systems reviewed and are negative.    Physical Exam Updated Vital Signs BP (!) 149/64 (BP Location: Right Arm)   Pulse (!) 51   Temp 97.7 F (36.5 C) (Oral)   Resp 16   Ht 1.753 m (5\' 9" )   Wt 87.5 kg   SpO2 98%   BMI 28.50 kg/m   Physical Exam CONSTITUTIONAL: Elderly, no acute distress HEAD: Normocephalic/atraumatic EYES: EOMI/PERRL, no nystagmus, no ptosis ENMT: Mucous membranes moist NECK: supple no meningeal signs, no bruits SPINE/BACK:entire spine nontender CV: Irregular, murmurs noted LUNGS: Lungs are clear to auscultation bilaterally, no apparent distress ABDOMEN: soft, nontender, no rebound or guarding GU:no cva tenderness NEURO:Awake/alert, face symmetric, no arm or leg drift is noted Equal 5/5 strength with shoulder abduction, elbow flex/extension, wrist flex/extension in upper extremities and equal hand grips bilaterally Equal 5/5 strength with hip flexion,knee flex/extension, foot dorsi/plantar flexion Cranial nerves 3/4/5/6/07/17/08/11/12 tested and intact No past pointing Sensation to light touch intact in all extremities EXTREMITIES: pulses normal, full ROM SKIN: warm, color normal PSYCH: no abnormalities of mood noted, alert and oriented to situation Patient reports he is able to ambulate ED Treatments / Results  Labs (all labs ordered are listed, but only abnormal results are displayed) Labs Reviewed - No data to display  EKG None  Radiology Ct Head Wo Contrast   Result Date: 12/13/2018 CLINICAL DATA:  85 year old male with headache. EXAM: CT HEAD WITHOUT CONTRAST TECHNIQUE: Contiguous axial images were obtained from the base of the skull through the vertex without intravenous contrast. COMPARISON:  Head CT dated 07/18/2018. FINDINGS: Brain: The ventricles and sulci appropriate size for patient's age. Minimal periventricular and deep white matter chronic microvascular ischemic changes noted. There is no acute intracranial hemorrhage. No mass effect or midline shift. 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 Vascular: No hyperdense vessel or unexpected calcification. Skull: Normal. Negative for fracture or focal lesion. Sinuses/Orbits: Diffuse mucoperiosteal thickening of paranasal sinuses. The mastoid air cells are clear. Other: None IMPRESSION: 1. No acute intracranial hemorrhage. 2. 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 Electronically Signed   By: Anner Crete M.D.   On: 12/13/2018 18:57    Procedures Procedures   Medications Ordered in ED Medications - No data to display   Initial Impression /  Assessment and Plan / ED Course  I have reviewed the triage vital signs and the nursing notes.  Pertinent   imaging results that were available during my care of the patient were reviewed by me and considered in my medical decision making (see chart for details).       12:17 AM Patient presents with a headache that he woke up with.  He never had any focal neurologic deficits.  His headache has resolved spontaneously.  I discussed CT head findings with son and patient.  No acute findings noted.  Patient has no neuro deficits here Patient and son are requesting discharge home. This is a reasonable plan.  We discussed return precautions Final Clinical Impressions(s) / ED Diagnoses   Final diagnoses:  Other headache syndrome    ED Discharge Orders    None        Ripley Fraise, MD 12/14/18 [phone]

## 2018-12-14 NOTE — Discharge Instructions (Addendum)

## 2019-04-30 DIAGNOSIS — N2581 Secondary hyperparathyroidism of renal origin: Secondary | ICD-10-CM | POA: Diagnosis not present

## 2019-04-30 DIAGNOSIS — I5030 Unspecified diastolic (congestive) heart failure: Secondary | ICD-10-CM | POA: Diagnosis not present

## 2019-04-30 DIAGNOSIS — N179 Acute kidney failure, unspecified: Secondary | ICD-10-CM | POA: Diagnosis not present

## 2019-04-30 DIAGNOSIS — D631 Anemia in chronic kidney disease: Secondary | ICD-10-CM | POA: Diagnosis not present

## 2019-04-30 DIAGNOSIS — G459 Transient cerebral ischemic attack, unspecified: Secondary | ICD-10-CM | POA: Diagnosis not present

## 2019-04-30 DIAGNOSIS — N183 Chronic kidney disease, stage 3 unspecified: Secondary | ICD-10-CM | POA: Diagnosis not present

## 2019-04-30 DIAGNOSIS — Z8709 Personal history of other diseases of the respiratory system: Secondary | ICD-10-CM | POA: Diagnosis not present

## 2019-04-30 DIAGNOSIS — N189 Chronic kidney disease, unspecified: Secondary | ICD-10-CM | POA: Diagnosis not present

## 2019-04-30 DIAGNOSIS — E785 Hyperlipidemia, unspecified: Secondary | ICD-10-CM | POA: Diagnosis not present

## 2019-05-27 DIAGNOSIS — J449 Chronic obstructive pulmonary disease, unspecified: Secondary | ICD-10-CM | POA: Diagnosis not present

## 2019-05-27 DIAGNOSIS — D6869 Other thrombophilia: Secondary | ICD-10-CM | POA: Diagnosis not present

## 2019-05-27 DIAGNOSIS — N189 Chronic kidney disease, unspecified: Secondary | ICD-10-CM | POA: Diagnosis not present

## 2019-05-27 DIAGNOSIS — M109 Gout, unspecified: Secondary | ICD-10-CM | POA: Diagnosis not present

## 2019-05-27 DIAGNOSIS — I480 Paroxysmal atrial fibrillation: Secondary | ICD-10-CM | POA: Diagnosis not present

## 2019-05-27 DIAGNOSIS — I5022 Chronic systolic (congestive) heart failure: Secondary | ICD-10-CM | POA: Diagnosis not present

## 2019-05-27 DIAGNOSIS — E785 Hyperlipidemia, unspecified: Secondary | ICD-10-CM | POA: Diagnosis not present

## 2019-05-27 DIAGNOSIS — I10 Essential (primary) hypertension: Secondary | ICD-10-CM | POA: Diagnosis not present

## 2019-05-27 DIAGNOSIS — M159 Polyosteoarthritis, unspecified: Secondary | ICD-10-CM | POA: Diagnosis not present

## 2019-05-27 DIAGNOSIS — Q049 Congenital malformation of brain, unspecified: Secondary | ICD-10-CM | POA: Diagnosis not present

## 2019-05-27 DIAGNOSIS — I259 Chronic ischemic heart disease, unspecified: Secondary | ICD-10-CM | POA: Diagnosis not present

## 2019-05-27 DIAGNOSIS — I679 Cerebrovascular disease, unspecified: Secondary | ICD-10-CM | POA: Diagnosis not present

## 2019-06-13 DIAGNOSIS — N183 Chronic kidney disease, stage 3 unspecified: Secondary | ICD-10-CM | POA: Diagnosis not present

## 2019-06-20 DIAGNOSIS — H02839 Dermatochalasis of unspecified eye, unspecified eyelid: Secondary | ICD-10-CM | POA: Diagnosis not present

## 2019-06-20 DIAGNOSIS — H01009 Unspecified blepharitis unspecified eye, unspecified eyelid: Secondary | ICD-10-CM | POA: Diagnosis not present

## 2019-06-20 DIAGNOSIS — H25013 Cortical age-related cataract, bilateral: Secondary | ICD-10-CM | POA: Diagnosis not present

## 2019-06-20 DIAGNOSIS — H11153 Pinguecula, bilateral: Secondary | ICD-10-CM | POA: Diagnosis not present

## 2019-06-20 DIAGNOSIS — H401132 Primary open-angle glaucoma, bilateral, moderate stage: Secondary | ICD-10-CM | POA: Diagnosis not present

## 2019-06-20 DIAGNOSIS — H2513 Age-related nuclear cataract, bilateral: Secondary | ICD-10-CM | POA: Diagnosis not present

## 2019-06-20 DIAGNOSIS — D3131 Benign neoplasm of right choroid: Secondary | ICD-10-CM | POA: Diagnosis not present

## 2019-08-05 ENCOUNTER — Other Ambulatory Visit: Payer: Self-pay

## 2019-08-18 DIAGNOSIS — M159 Polyosteoarthritis, unspecified: Secondary | ICD-10-CM | POA: Diagnosis not present

## 2019-08-18 DIAGNOSIS — Z6827 Body mass index (BMI) 27.0-27.9, adult: Secondary | ICD-10-CM | POA: Diagnosis not present

## 2019-08-18 DIAGNOSIS — M109 Gout, unspecified: Secondary | ICD-10-CM | POA: Diagnosis not present

## 2019-08-18 DIAGNOSIS — N1832 Chronic kidney disease, stage 3b: Secondary | ICD-10-CM | POA: Diagnosis not present

## 2019-08-18 DIAGNOSIS — E663 Overweight: Secondary | ICD-10-CM | POA: Diagnosis not present

## 2019-08-18 DIAGNOSIS — M25561 Pain in right knee: Secondary | ICD-10-CM | POA: Diagnosis not present

## 2019-10-04 DIAGNOSIS — Z23 Encounter for immunization: Secondary | ICD-10-CM | POA: Diagnosis not present

## 2019-11-11 DIAGNOSIS — G459 Transient cerebral ischemic attack, unspecified: Secondary | ICD-10-CM | POA: Diagnosis not present

## 2019-11-11 DIAGNOSIS — N2581 Secondary hyperparathyroidism of renal origin: Secondary | ICD-10-CM | POA: Diagnosis not present

## 2019-11-11 DIAGNOSIS — E785 Hyperlipidemia, unspecified: Secondary | ICD-10-CM | POA: Diagnosis not present

## 2019-11-11 DIAGNOSIS — N183 Chronic kidney disease, stage 3 unspecified: Secondary | ICD-10-CM | POA: Diagnosis not present

## 2019-11-11 DIAGNOSIS — Z8709 Personal history of other diseases of the respiratory system: Secondary | ICD-10-CM | POA: Diagnosis not present

## 2019-11-11 DIAGNOSIS — N179 Acute kidney failure, unspecified: Secondary | ICD-10-CM | POA: Diagnosis not present

## 2019-11-11 DIAGNOSIS — D631 Anemia in chronic kidney disease: Secondary | ICD-10-CM | POA: Diagnosis not present

## 2019-11-11 DIAGNOSIS — I5030 Unspecified diastolic (congestive) heart failure: Secondary | ICD-10-CM | POA: Diagnosis not present

## 2019-11-11 DIAGNOSIS — N189 Chronic kidney disease, unspecified: Secondary | ICD-10-CM | POA: Diagnosis not present

## 2019-11-24 DIAGNOSIS — M1A09X0 Idiopathic chronic gout, multiple sites, without tophus (tophi): Secondary | ICD-10-CM | POA: Diagnosis not present

## 2019-11-24 DIAGNOSIS — K2960 Other gastritis without bleeding: Secondary | ICD-10-CM | POA: Diagnosis not present

## 2019-11-24 DIAGNOSIS — R7302 Impaired glucose tolerance (oral): Secondary | ICD-10-CM | POA: Diagnosis not present

## 2019-11-24 DIAGNOSIS — I480 Paroxysmal atrial fibrillation: Secondary | ICD-10-CM | POA: Diagnosis not present

## 2019-11-24 DIAGNOSIS — Z0000 Encounter for general adult medical examination without abnormal findings: Secondary | ICD-10-CM | POA: Diagnosis not present

## 2019-11-24 DIAGNOSIS — M1991 Primary osteoarthritis, unspecified site: Secondary | ICD-10-CM | POA: Diagnosis not present

## 2019-11-24 DIAGNOSIS — Z79899 Other long term (current) drug therapy: Secondary | ICD-10-CM | POA: Diagnosis not present

## 2019-11-24 DIAGNOSIS — D6869 Other thrombophilia: Secondary | ICD-10-CM | POA: Diagnosis not present

## 2019-11-24 DIAGNOSIS — L858 Other specified epidermal thickening: Secondary | ICD-10-CM | POA: Diagnosis not present

## 2019-11-24 DIAGNOSIS — N1832 Chronic kidney disease, stage 3b: Secondary | ICD-10-CM | POA: Diagnosis not present

## 2019-11-24 DIAGNOSIS — E785 Hyperlipidemia, unspecified: Secondary | ICD-10-CM | POA: Diagnosis not present

## 2019-11-24 DIAGNOSIS — I10 Essential (primary) hypertension: Secondary | ICD-10-CM | POA: Diagnosis not present

## 2020-04-07 DIAGNOSIS — E785 Hyperlipidemia, unspecified: Secondary | ICD-10-CM | POA: Diagnosis not present

## 2020-04-07 DIAGNOSIS — I480 Paroxysmal atrial fibrillation: Secondary | ICD-10-CM | POA: Diagnosis not present

## 2020-04-07 DIAGNOSIS — J449 Chronic obstructive pulmonary disease, unspecified: Secondary | ICD-10-CM | POA: Diagnosis not present

## 2020-04-12 DIAGNOSIS — H25813 Combined forms of age-related cataract, bilateral: Secondary | ICD-10-CM | POA: Diagnosis not present

## 2020-04-12 DIAGNOSIS — H401130 Primary open-angle glaucoma, bilateral, stage unspecified: Secondary | ICD-10-CM | POA: Diagnosis not present

## 2020-04-12 DIAGNOSIS — H02831 Dermatochalasis of right upper eyelid: Secondary | ICD-10-CM | POA: Diagnosis not present

## 2020-04-30 DIAGNOSIS — H401132 Primary open-angle glaucoma, bilateral, moderate stage: Secondary | ICD-10-CM | POA: Diagnosis not present

## 2020-05-08 DIAGNOSIS — E785 Hyperlipidemia, unspecified: Secondary | ICD-10-CM | POA: Diagnosis not present

## 2020-05-08 DIAGNOSIS — I480 Paroxysmal atrial fibrillation: Secondary | ICD-10-CM | POA: Diagnosis not present

## 2020-05-08 DIAGNOSIS — J449 Chronic obstructive pulmonary disease, unspecified: Secondary | ICD-10-CM | POA: Diagnosis not present

## 2020-05-24 DIAGNOSIS — I5022 Chronic systolic (congestive) heart failure: Secondary | ICD-10-CM | POA: Diagnosis not present

## 2020-05-24 DIAGNOSIS — E785 Hyperlipidemia, unspecified: Secondary | ICD-10-CM | POA: Diagnosis not present

## 2020-05-24 DIAGNOSIS — G720 Drug-induced myopathy: Secondary | ICD-10-CM | POA: Diagnosis not present

## 2020-05-24 DIAGNOSIS — I10 Essential (primary) hypertension: Secondary | ICD-10-CM | POA: Diagnosis not present

## 2020-05-24 DIAGNOSIS — Z79899 Other long term (current) drug therapy: Secondary | ICD-10-CM | POA: Diagnosis not present

## 2020-05-24 DIAGNOSIS — I4510 Unspecified right bundle-branch block: Secondary | ICD-10-CM | POA: Diagnosis not present

## 2020-05-24 DIAGNOSIS — T466X5A Adverse effect of antihyperlipidemic and antiarteriosclerotic drugs, initial encounter: Secondary | ICD-10-CM | POA: Diagnosis not present

## 2020-05-24 DIAGNOSIS — I480 Paroxysmal atrial fibrillation: Secondary | ICD-10-CM | POA: Diagnosis not present

## 2020-05-24 DIAGNOSIS — D6869 Other thrombophilia: Secondary | ICD-10-CM | POA: Diagnosis not present

## 2020-05-24 DIAGNOSIS — R7301 Impaired fasting glucose: Secondary | ICD-10-CM | POA: Diagnosis not present

## 2020-05-24 DIAGNOSIS — I25119 Atherosclerotic heart disease of native coronary artery with unspecified angina pectoris: Secondary | ICD-10-CM | POA: Diagnosis not present

## 2020-05-24 DIAGNOSIS — N1832 Chronic kidney disease, stage 3b: Secondary | ICD-10-CM | POA: Diagnosis not present

## 2020-05-27 DIAGNOSIS — D631 Anemia in chronic kidney disease: Secondary | ICD-10-CM | POA: Diagnosis not present

## 2020-05-27 DIAGNOSIS — N2581 Secondary hyperparathyroidism of renal origin: Secondary | ICD-10-CM | POA: Diagnosis not present

## 2020-05-27 DIAGNOSIS — G459 Transient cerebral ischemic attack, unspecified: Secondary | ICD-10-CM | POA: Diagnosis not present

## 2020-05-27 DIAGNOSIS — Z8709 Personal history of other diseases of the respiratory system: Secondary | ICD-10-CM | POA: Diagnosis not present

## 2020-05-27 DIAGNOSIS — N183 Chronic kidney disease, stage 3 unspecified: Secondary | ICD-10-CM | POA: Diagnosis not present

## 2020-05-27 DIAGNOSIS — I5030 Unspecified diastolic (congestive) heart failure: Secondary | ICD-10-CM | POA: Diagnosis not present

## 2020-05-27 DIAGNOSIS — E785 Hyperlipidemia, unspecified: Secondary | ICD-10-CM | POA: Diagnosis not present

## 2020-05-27 DIAGNOSIS — N179 Acute kidney failure, unspecified: Secondary | ICD-10-CM | POA: Diagnosis not present

## 2020-06-15 DIAGNOSIS — Z01818 Encounter for other preprocedural examination: Secondary | ICD-10-CM | POA: Diagnosis not present

## 2020-06-15 DIAGNOSIS — H401112 Primary open-angle glaucoma, right eye, moderate stage: Secondary | ICD-10-CM | POA: Diagnosis not present

## 2020-06-15 DIAGNOSIS — H2511 Age-related nuclear cataract, right eye: Secondary | ICD-10-CM | POA: Diagnosis not present

## 2020-06-15 DIAGNOSIS — H401132 Primary open-angle glaucoma, bilateral, moderate stage: Secondary | ICD-10-CM | POA: Diagnosis not present

## 2020-06-15 DIAGNOSIS — H25813 Combined forms of age-related cataract, bilateral: Secondary | ICD-10-CM | POA: Diagnosis not present

## 2020-06-15 DIAGNOSIS — H401130 Primary open-angle glaucoma, bilateral, stage unspecified: Secondary | ICD-10-CM | POA: Diagnosis not present

## 2020-06-15 IMAGING — DX DG HIP (WITH OR WITHOUT PELVIS) 2-3V LEFT
3 series · 3 of 3 positions shown · non-contrast
Comparison: None.

CLINICAL DATA: Left hip pain secondary to a fall today.

EXAM:
DG HIP (WITH OR WITHOUT PELVIS) 2-3V LEFT

[pelvis ap]
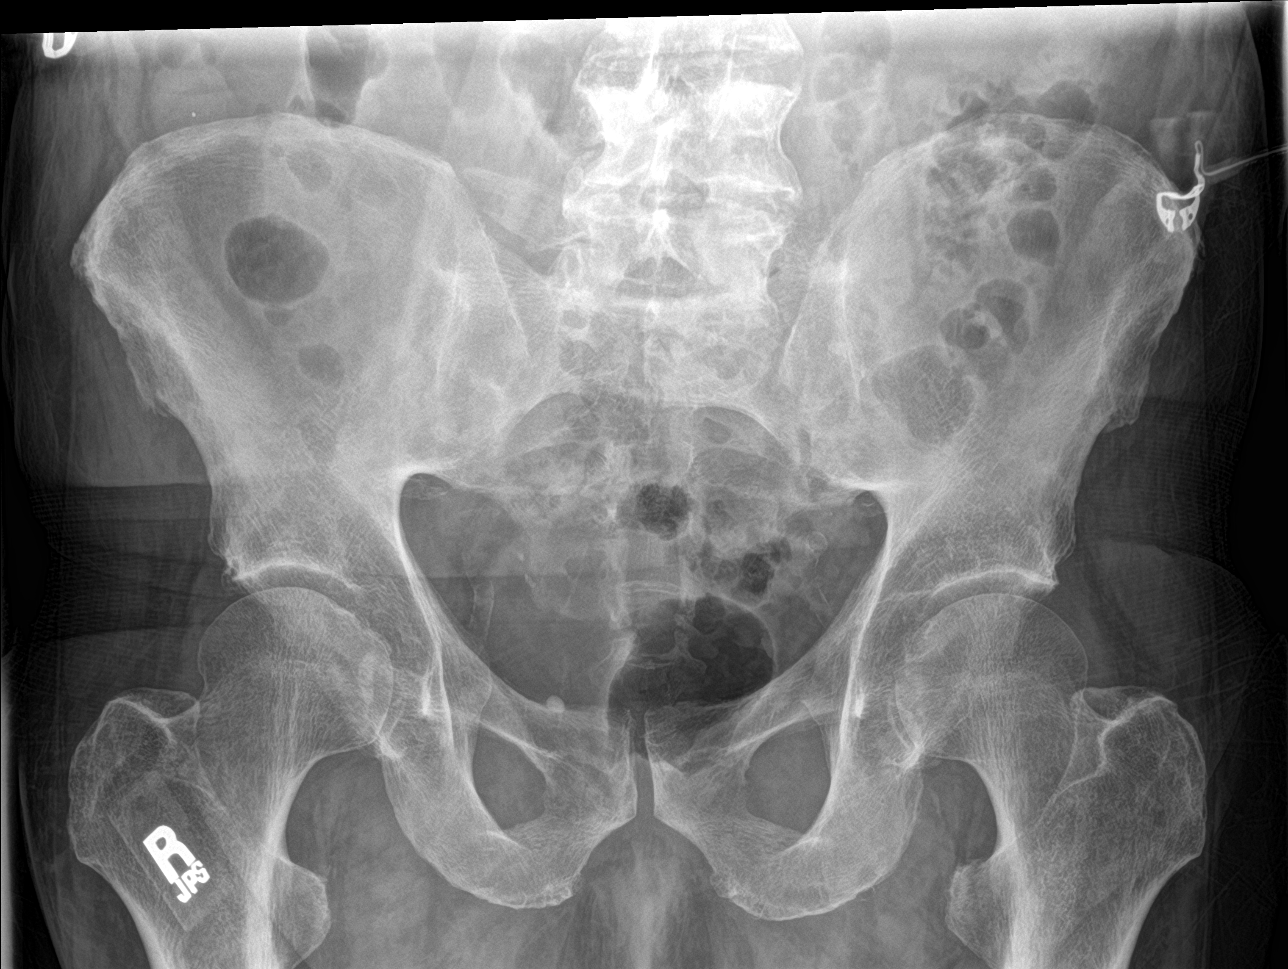

[hip ap]
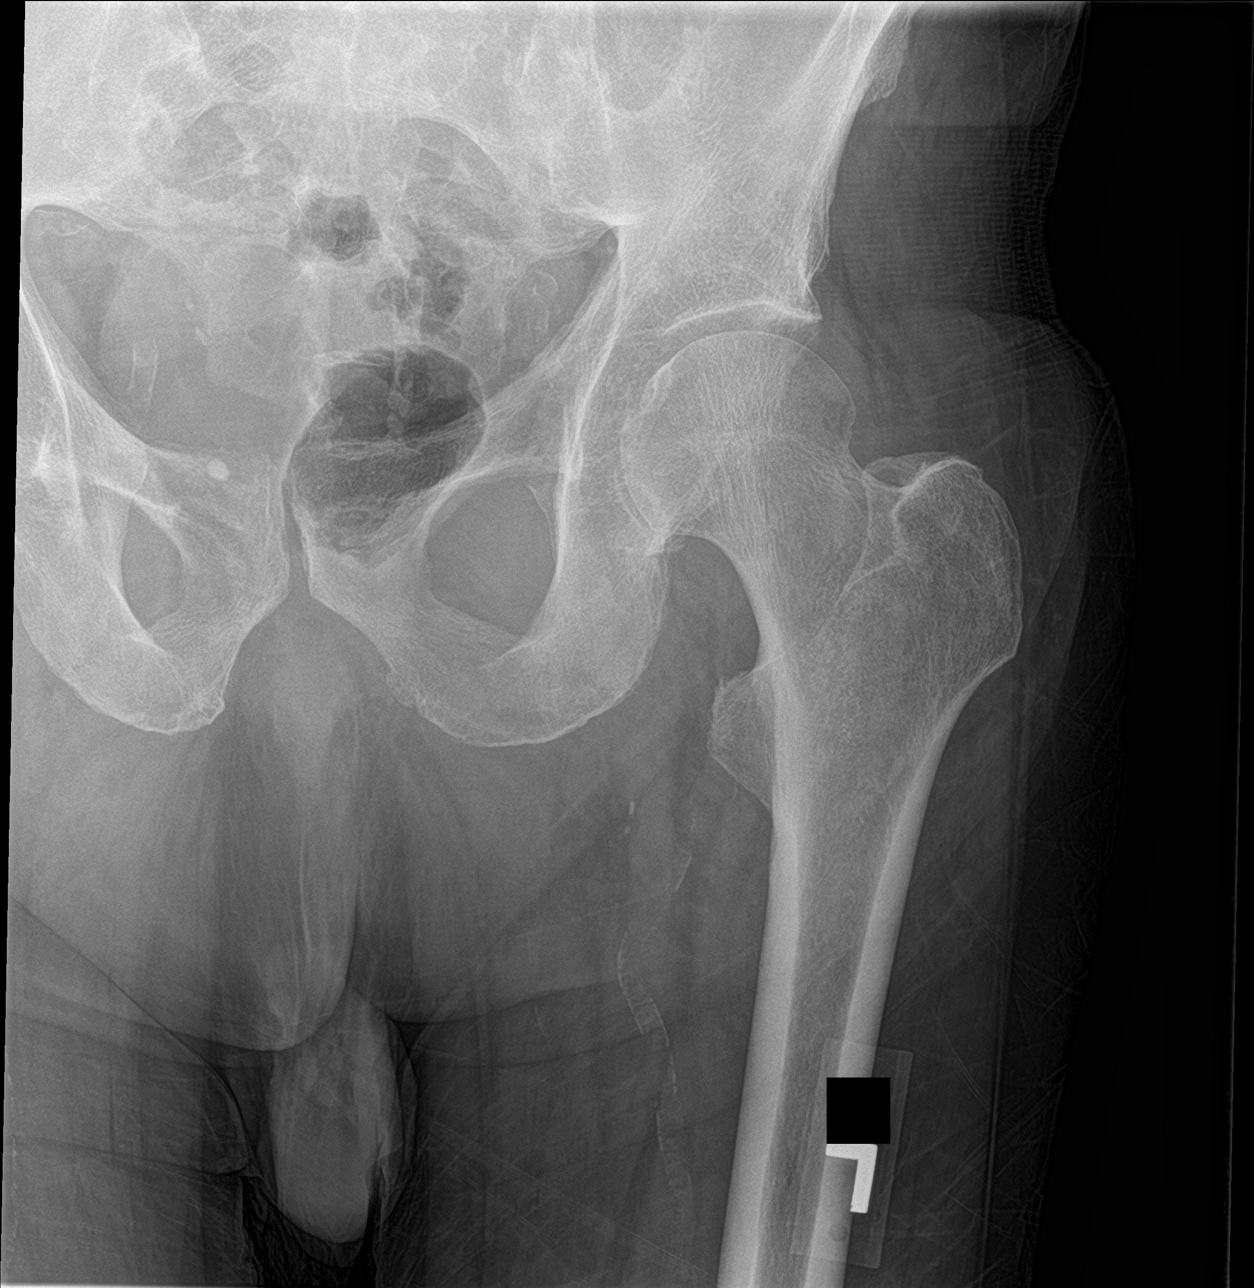

[hip lat]
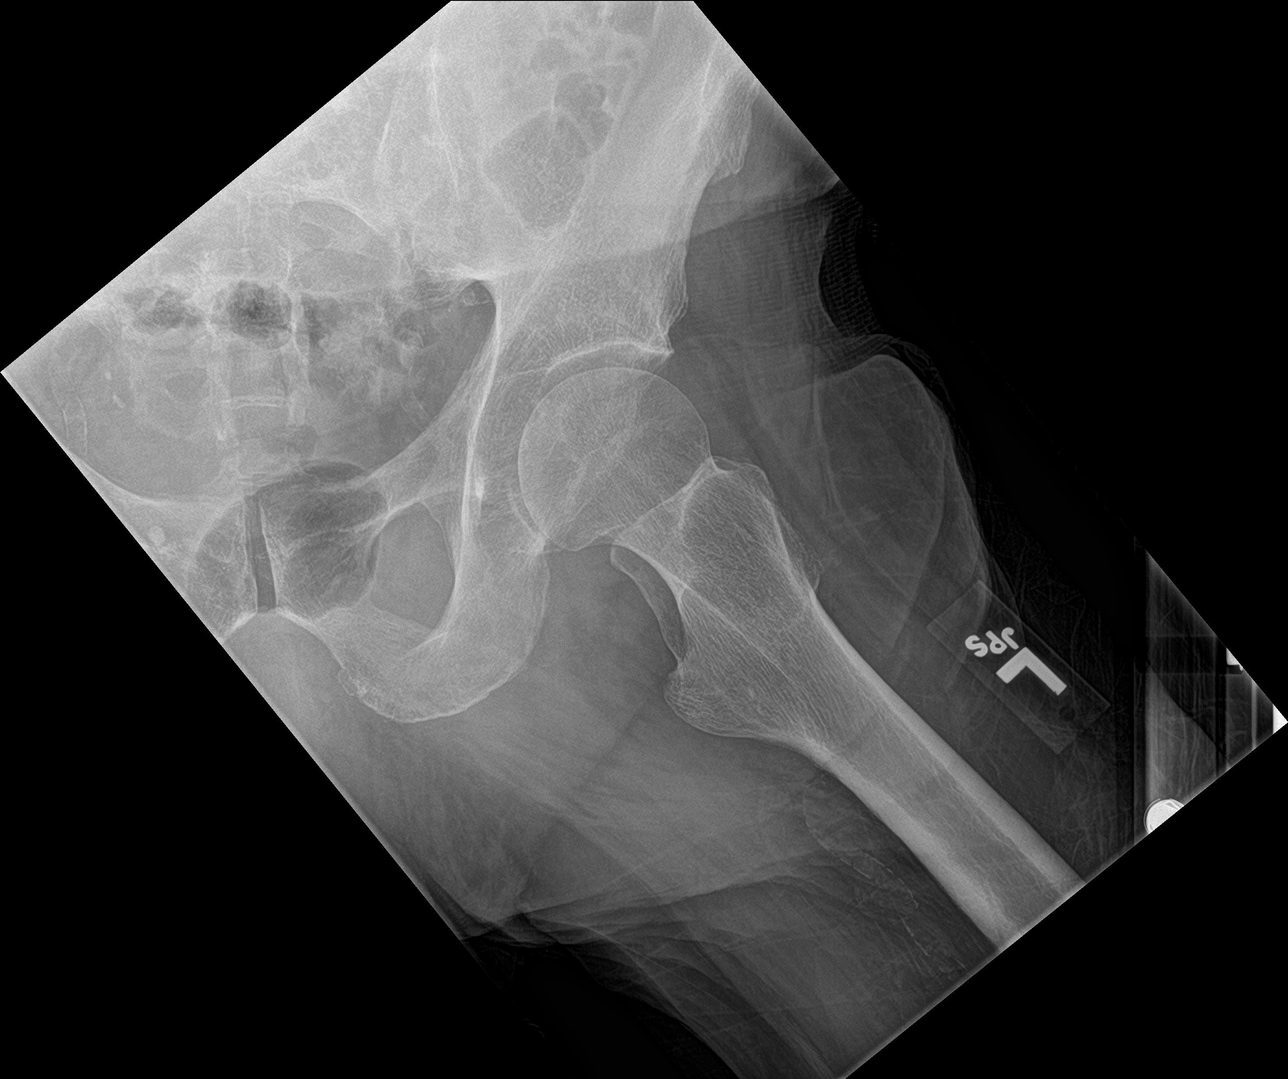

[3 of 3 positions shown; findings below may reference images not displayed]

FINDINGS: There is no fracture or dislocation. The sacroiliac joints appear to
be fused. Visualized bowel gas pattern is normal.
IMPRESSION: No acute abnormality.

## 2020-06-15 IMAGING — DX RIGHT ELBOW - COMPLETE 3+ VIEW
4 series · 4 of 4 positions shown · non-contrast
Comparison: None.

CLINICAL DATA: Right elbow pain after a fall today. Laceration.

EXAM:
RIGHT ELBOW - COMPLETE 3+ VIEW

[elbow ap]
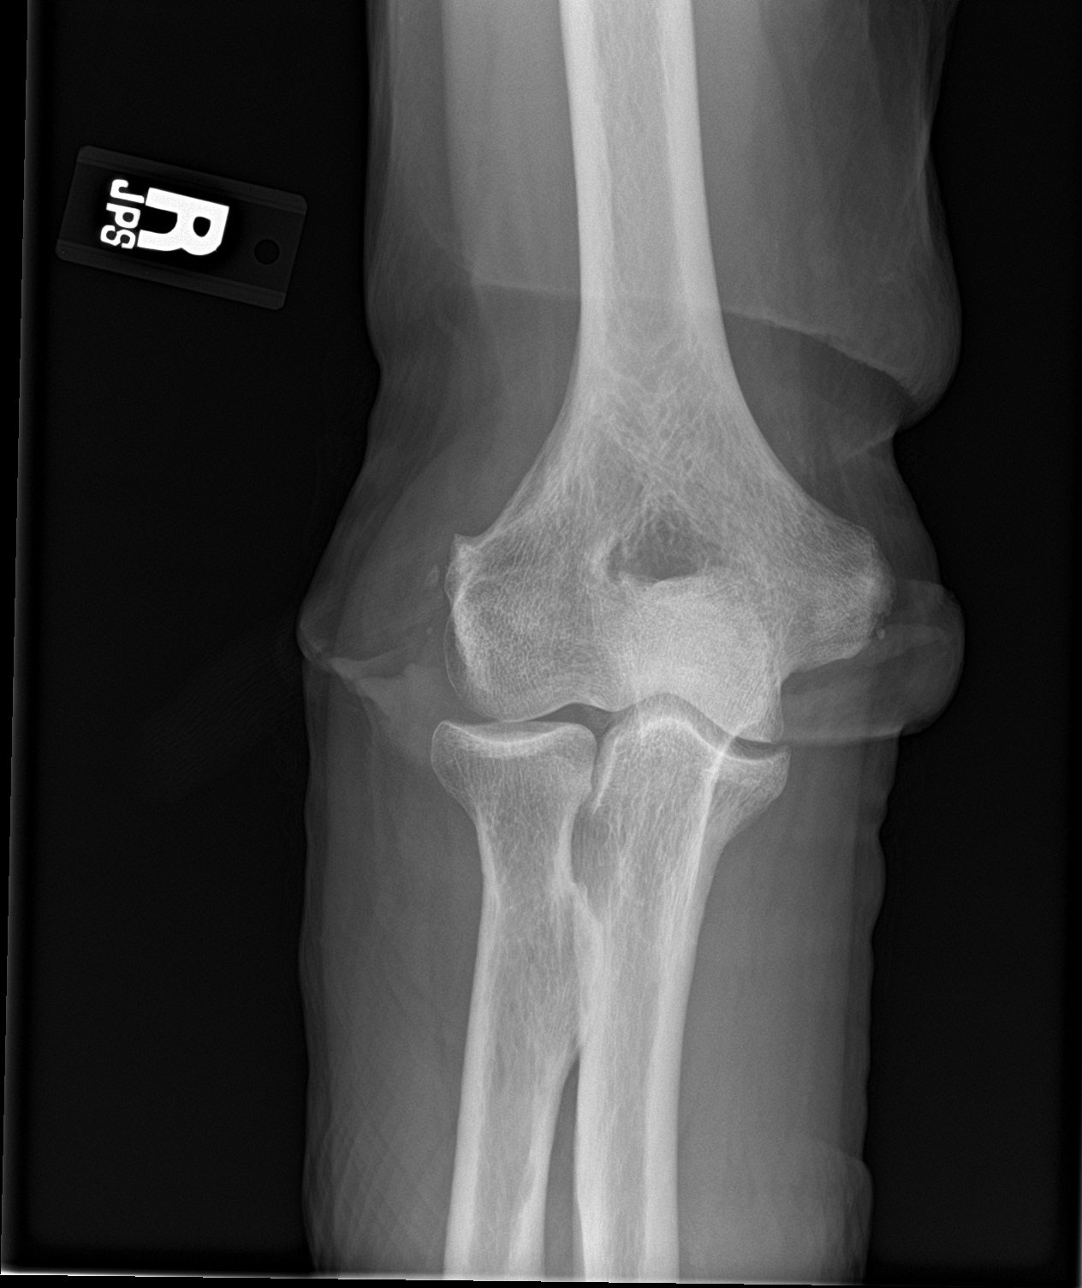

[elbow obl (1 of 2)]
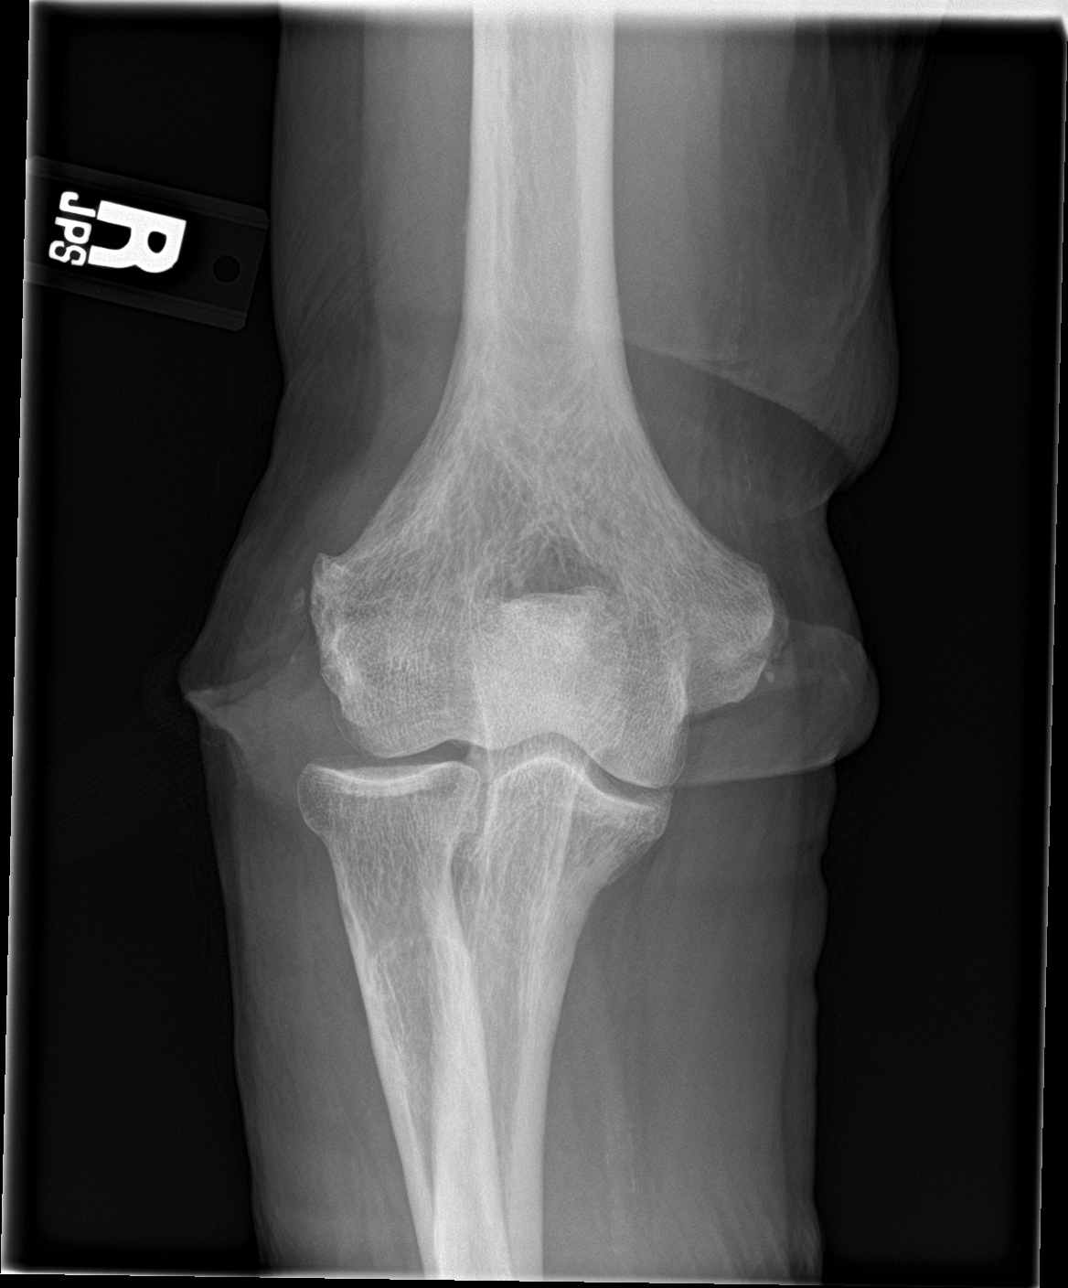

[elbow obl (2 of 2)]
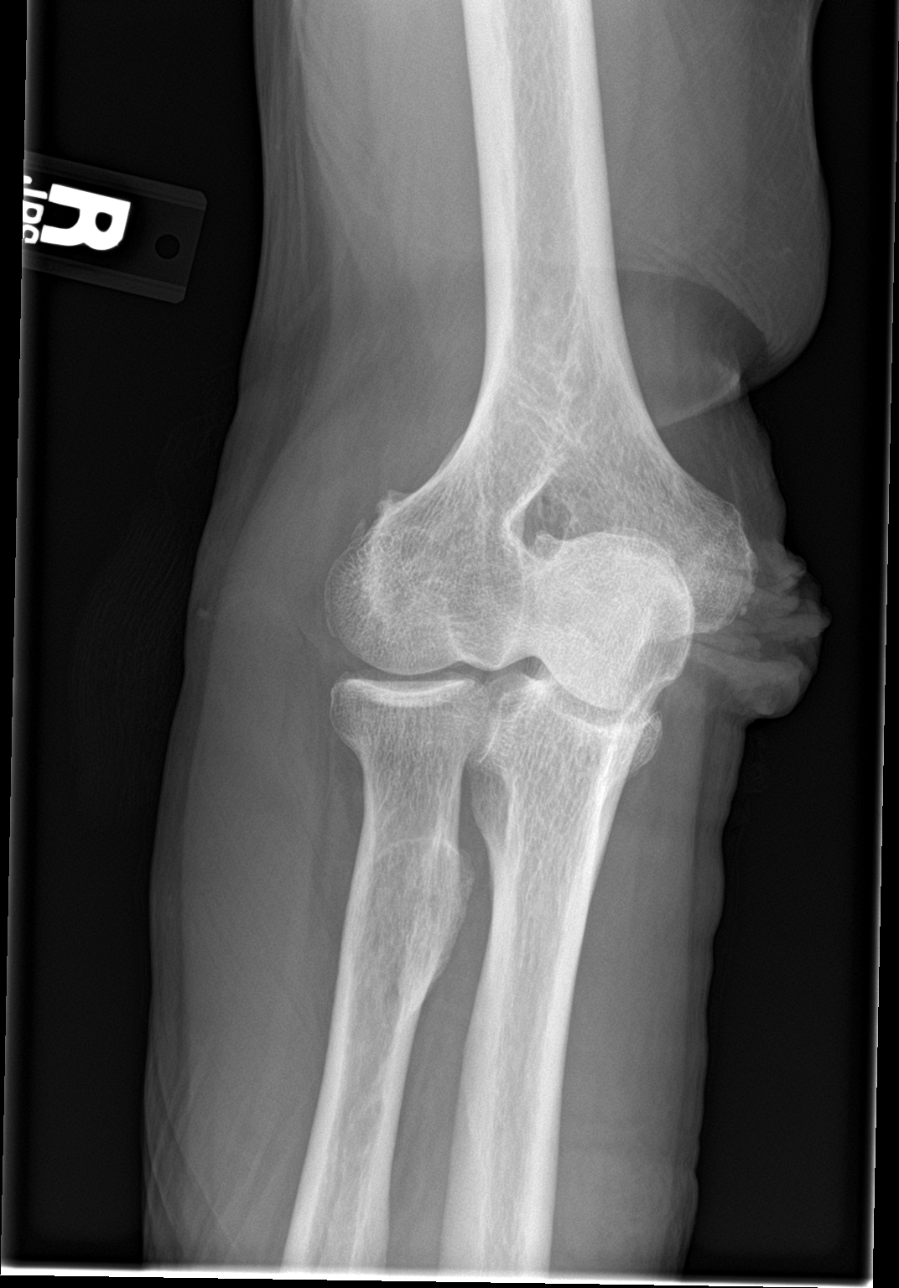

[elbow lat]
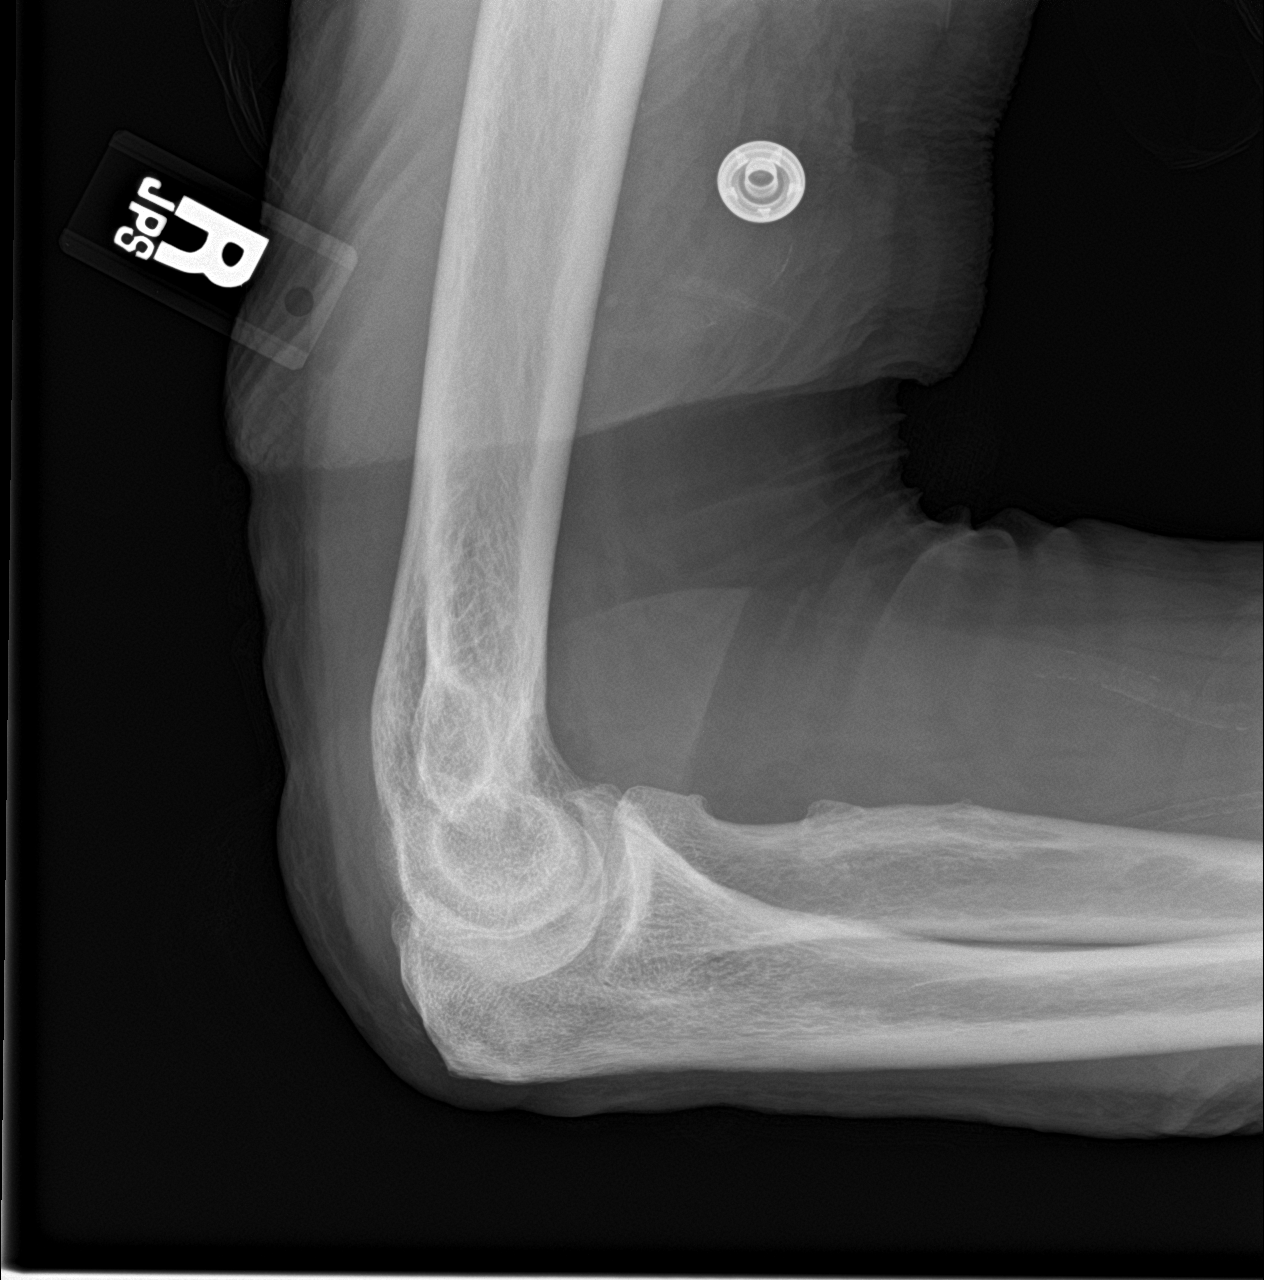

[4 of 4 positions shown; findings below may reference images not displayed]

FINDINGS: There is no fracture or dislocation. Slight calcific tendinopathy in
the origins of the common extensor and common flexor tendons from
the distal humeral condyles. No joint effusion
IMPRESSION: No acute abnormality.

## 2020-07-05 DIAGNOSIS — H401122 Primary open-angle glaucoma, left eye, moderate stage: Secondary | ICD-10-CM | POA: Diagnosis not present

## 2020-07-05 DIAGNOSIS — H2512 Age-related nuclear cataract, left eye: Secondary | ICD-10-CM | POA: Diagnosis not present

## 2020-07-05 DIAGNOSIS — H25812 Combined forms of age-related cataract, left eye: Secondary | ICD-10-CM | POA: Diagnosis not present

## 2020-08-17 DIAGNOSIS — H04123 Dry eye syndrome of bilateral lacrimal glands: Secondary | ICD-10-CM | POA: Diagnosis not present

## 2020-09-20 ENCOUNTER — Emergency Department (HOSPITAL_COMMUNITY)
Admission: EM | Admit: 2020-09-20 | Discharge: 2020-09-21 | Disposition: A | Discharge status: Home / Self Care | Payer: PPO | Attending: Emergency Medicine | Admitting: Emergency Medicine

## 2020-09-20 ENCOUNTER — Emergency Department (HOSPITAL_COMMUNITY): Payer: PPO

## 2020-09-20 ENCOUNTER — Encounter (HOSPITAL_COMMUNITY): Payer: Self-pay | Admitting: Emergency Medicine

## 2020-09-20 ENCOUNTER — Other Ambulatory Visit: Payer: Self-pay

## 2020-09-20 DIAGNOSIS — R0602 Shortness of breath: Secondary | ICD-10-CM | POA: Diagnosis not present

## 2020-09-20 DIAGNOSIS — Z8616 Personal history of COVID-19: Secondary | ICD-10-CM | POA: Diagnosis not present

## 2020-09-20 DIAGNOSIS — E860 Dehydration: Secondary | ICD-10-CM | POA: Insufficient documentation

## 2020-09-20 DIAGNOSIS — R001 Bradycardia, unspecified: Secondary | ICD-10-CM | POA: Diagnosis not present

## 2020-09-20 DIAGNOSIS — Z7901 Long term (current) use of anticoagulants: Secondary | ICD-10-CM | POA: Diagnosis not present

## 2020-09-20 DIAGNOSIS — R197 Diarrhea, unspecified: Secondary | ICD-10-CM | POA: Diagnosis not present

## 2020-09-20 DIAGNOSIS — I480 Paroxysmal atrial fibrillation: Secondary | ICD-10-CM | POA: Diagnosis not present

## 2020-09-20 DIAGNOSIS — I130 Hypertensive heart and chronic kidney disease with heart failure and stage 1 through stage 4 chronic kidney disease, or unspecified chronic kidney disease: Secondary | ICD-10-CM | POA: Insufficient documentation

## 2020-09-20 DIAGNOSIS — Z7951 Long term (current) use of inhaled steroids: Secondary | ICD-10-CM | POA: Insufficient documentation

## 2020-09-20 DIAGNOSIS — R42 Dizziness and giddiness: Secondary | ICD-10-CM | POA: Diagnosis not present

## 2020-09-20 DIAGNOSIS — R519 Headache, unspecified: Secondary | ICD-10-CM | POA: Diagnosis not present

## 2020-09-20 DIAGNOSIS — J45909 Unspecified asthma, uncomplicated: Secondary | ICD-10-CM | POA: Insufficient documentation

## 2020-09-20 DIAGNOSIS — I517 Cardiomegaly: Secondary | ICD-10-CM | POA: Diagnosis not present

## 2020-09-20 DIAGNOSIS — Z87891 Personal history of nicotine dependence: Secondary | ICD-10-CM | POA: Insufficient documentation

## 2020-09-20 DIAGNOSIS — R5382 Chronic fatigue, unspecified: Secondary | ICD-10-CM | POA: Diagnosis not present

## 2020-09-20 DIAGNOSIS — Z79899 Other long term (current) drug therapy: Secondary | ICD-10-CM | POA: Diagnosis not present

## 2020-09-20 DIAGNOSIS — N183 Chronic kidney disease, stage 3 unspecified: Secondary | ICD-10-CM | POA: Diagnosis not present

## 2020-09-20 DIAGNOSIS — Z20828 Contact with and (suspected) exposure to other viral communicable diseases: Secondary | ICD-10-CM | POA: Diagnosis not present

## 2020-09-20 DIAGNOSIS — I499 Cardiac arrhythmia, unspecified: Secondary | ICD-10-CM | POA: Diagnosis not present

## 2020-09-20 DIAGNOSIS — I509 Heart failure, unspecified: Secondary | ICD-10-CM | POA: Diagnosis not present

## 2020-09-20 LAB — CBC
HCT: 36.6 % — ABNORMAL LOW (ref 39.0–52.0)
Hemoglobin: 10.5 g/dL — ABNORMAL LOW (ref 13.0–17.0)
MCH: 21.5 pg — ABNORMAL LOW (ref 26.0–34.0)
MCHC: 28.7 g/dL — ABNORMAL LOW (ref 30.0–36.0)
MCV: 75 fL — ABNORMAL LOW (ref 80.0–100.0)
Platelets: 350 10*3/uL (ref 150–400)
RBC: 4.88 MIL/uL (ref 4.22–5.81)
RDW: 16.3 % — ABNORMAL HIGH (ref 11.5–15.5)
WBC: 11 10*3/uL — ABNORMAL HIGH (ref 4.0–10.5)
nRBC: 0 % (ref 0.0–0.2)

## 2020-09-20 LAB — BASIC METABOLIC PANEL
Anion gap: 8 (ref 5–15)
BUN: 22 mg/dL (ref 8–23)
CO2: 24 mmol/L (ref 22–32)
Calcium: 8.9 mg/dL (ref 8.9–10.3)
Chloride: 110 mmol/L (ref 98–111)
Creatinine, Ser: 1.94 mg/dL — ABNORMAL HIGH (ref 0.61–1.24)
GFR, Estimated: 34 mL/min — ABNORMAL LOW (ref >60)
Glucose, Bld: 96 mg/dL (ref 70–99)
Potassium: 4.7 mmol/L (ref 3.5–5.1)
Sodium: 142 mmol/L (ref 135–145)

## 2020-09-20 LAB — TROPONIN I (HIGH SENSITIVITY): Troponin I (High Sensitivity): 16 ng/L (ref <18)

## 2020-09-20 NOTE — ED Triage Notes (Addendum)
Patient from home, son states that he was dizzy earlier in the day, went to an UC and had an ekg that was not normal for him.  Patient does have an history of afib.  Patient is HOH, CAOx4, denies any chest pain or shortness of breath at this time.  Patient did have episode of tachycardia with hypotension earlier with son.

## 2020-09-21 LAB — TROPONIN I (HIGH SENSITIVITY): Troponin I (High Sensitivity): 14 ng/L (ref <18)

## 2020-09-21 NOTE — ED Notes (Signed)
Post ambulation vitals.  138/87 (102), 129 HR, 17 RR, 96% RA  Pt ambulated in hall with steady gait. No complaints of dizziness/lightheadedness, denies N/V.

## 2020-09-21 NOTE — ED Provider Notes (Signed)
[facility] EMERGENCY DEPARTMENT Provider Note   CSN: [id number] Arrival date & time: 09/20/20  1749     History Chief Complaint  Patient presents with   Abnormal ECG    Nicholas [name] is a 87 y.o. male.  The history is provided by the patient and a relative.  Dizziness Quality:  Lightheadedness Severity:  Moderate Onset quality:  Gradual Timing:  Intermittent Progression:  Improving Chronicity:  New Relieved by:  Nothing Worsened by:  Nothing Associated symptoms: diarrhea and headaches   Associated symptoms: no chest pain and no vomiting   Associated symptoms comment:  Chronic shortness of breath Patient presents with family.  Patient has a history of paroxysmal atrial fibrillation, chronic kidney disease, TIAs presents with dizziness and headache.  Patient told his family around 1:30 PM yesterday that he was feeling dizziness.  His family members checked his vitals and had an increased heart rate and low blood pressure.  There has been no missed medications or additional medications given.  He does report slight increase in his typical diarrhea.  No vomiting.  No chest pain.  No focal weakness.  No syncope.  He was seen in urgent care and was told to go to the ER for an abnormal EKG as they thought he might need a pacemaker.      Past Medical History:  Diagnosis Date   Anemia    none recent   Arthritis    Asthma    Chronic kidney disease    ckd stage 3   Family history of adverse reaction to anesthesia    daughter has ponv    Glaucoma    Hypertension     Patient Active Problem List   Diagnosis Date Noted   Pneumonia due to COVID-19 virus 07/19/2018   COVID-19 virus infection 07/18/2018   Renal insufficiency 11/27/2017   Hyperlipidemia 11/02/2017   TIA (transient ischemic attack) 11/01/2017   CHF (congestive heart failure) ([place]) 11/01/2017   HTN (hypertension) 11/01/2017    Past Surgical History:  Procedure Laterality Date   COLONOSCOPY  WITH PROPOFOL N/A 02/03/2015   Procedure: COLONOSCOPY WITH PROPOFOL;  Surgeon: Arta Silence, MD;  Location: [facility];  Service: Endoscopy;  Laterality: N/A;   KNEE ARTHROSCOPY Left    ROTATOR CUFF REPAIR Right    SHOULDER ARTHROSCOPY Left        Family History  Problem Relation Age of Onset   Hypertension Mother    Hypertension Father     Social History   Tobacco Use   Smoking status: Former   Smokeless tobacco: Never   Tobacco comments:    quit 35 yrs ago  Substance Use Topics   Alcohol use: No   Drug use: No    Home Medications Prior to Admission medications   Medication Sig Start Date End Date Taking? Authorizing Provider  acetaminophen (TYLENOL) 500 MG tablet Take 500-1,000 mg by mouth every 8 (eight) hours as needed (for pain or headaches).     [provider]  albuterol (PROVENTIL HFA;VENTOLIN HFA) 108 (90 Base) MCG/ACT inhaler Inhale 2 puffs into the lungs every 4 (four) hours as needed.     [provider]  allopurinol (ZYLOPRIM) 100 MG tablet Take 100 mg by mouth daily.    [provider]  apixaban (ELIQUIS) 2.5 MG TABS tablet Take 1 tablet (2.5 mg total) by mouth 2 (two) times daily. 07/21/18   Marcelyn Bruins, MD  bisoprolol (ZEBETA) 5 MG tablet Take 2.5 mg by mouth  every morning.     [provider]  Calcium Carb-Cholecalciferol (CALCIUM 600+D3 PO) Take 2 tablets by mouth daily.    [provider]  Fluticasone-Salmeterol (ADVAIR) 250-50 MCG/DOSE AEPB Inhale 1 puff into the lungs 2 (two) times daily.    [provider]  latanoprost (XALATAN) 0.005 % ophthalmic solution Place 1 drop into both eyes at bedtime. 07/26/17   [provider]  Multiple Vitamin (MULTIVITAMIN WITH MINERALS) TABS tablet Take 1 tablet by mouth every morning.    [provider]  Omega-3 Fatty Acids (FISH OIL) 1000 MG CAPS Take 1,000 mg by mouth daily.    [provider]  pantoprazole (PROTONIX) 20 MG tablet  Take 1 tablet (20 mg total) by mouth daily. 04/01/18   Garvin Fila, MD    Allergies    Penicillins  Review of Systems   Review of Systems  Constitutional:  Positive for fatigue. Negative for fever.  Respiratory:  Negative for cough.   Cardiovascular:  Negative for chest pain.  Gastrointestinal:  Positive for diarrhea. Negative for vomiting.  Neurological:  Positive for dizziness and headaches. Negative for syncope.  All other systems reviewed and are negative.  Physical Exam Updated Vital Signs BP 111/64   Pulse 93   Temp (!) 97.3 F (36.3 C) (Oral)   Resp 17   Ht 1.753 m (5\' 9" )   Wt 91.2 kg   SpO2 96%   BMI 29.68 kg/m   Physical Exam CONSTITUTIONAL: Elderly, no acute distress HEAD: Normocephalic/atraumatic EYES: EOMI/PERRL, no nystagmus,  no ptosis ENMT: Mucous membranes moist NECK: supple no meningeal signs CV: Irregular, no loud murmurs LUNGS: Lungs are clear to auscultation bilaterally, no apparent distress ABDOMEN: soft, nontender, no rebound or guarding GU:no cva tenderness NEURO:Awake/alert, face symmetric, no arm or leg drift is noted Equal 5/5 strength with shoulder abduction, elbow flex/extension,  Equal 5/5 strength with hip flexion,knee flex/extension, foot dorsi/plantar flexion Cranial nerves 3/4/5/6/07/17/08/11/12 tested and intact No past pointing Sensation to light touch intact in all extremities EXTREMITIES: pulses normal, full ROM SKIN: warm, color normal PSYCH: no abnormalities of mood noted  ED Results / Procedures / Treatments   Labs (all labs ordered are listed, but only abnormal results are displayed) Labs Reviewed  BASIC METABOLIC PANEL - Abnormal; Notable for the following components:      Result Value   Creatinine, Ser 1.94 (*)    GFR, Estimated 34 (*)    All other components within normal limits  CBC - Abnormal; Notable for the following components:   WBC 11.0 (*)    Hemoglobin 10.5 (*)    HCT 36.6 (*)    MCV 75.0 (*)    MCH  21.5 (*)    MCHC 28.7 (*)    RDW 16.3 (*)    All other components within normal limits  TROPONIN I (HIGH SENSITIVITY)  TROPONIN I (HIGH SENSITIVITY)    EKG EKG Interpretation  Date/Time:  Tuesday September 21 2020 03:33:37 EDT Ventricular Rate:  127 PR Interval:  60 QRS Duration: 142 QT Interval:  354 QTC Calculation: 515 R Axis:   238 Text Interpretation: Sinus tachycardia Right bundle branch block Confirmed by Ripley Fraise [phone]) on 09/21/2020 3:47:37 AM      Radiology DG Chest 2 View  Result Date: 09/20/2020 CLINICAL DATA:  Bradycardia, dizziness EXAM: CHEST - 2 VIEW COMPARISON:  09/22/2018 FINDINGS: Lungs are clear.  No pleural effusion or pneumothorax. Mild cardiomegaly.  Thoracic aortic atherosclerosis. Right shoulder arthroplasty. IMPRESSION: Mild cardiomegaly. No  evidence of acute cardiopulmonary disease. Electronically Signed   By: Julian Hy M.D.   On: 09/20/2020 21:04    Procedures Procedures   Medications Ordered in ED Medications - No data to display  ED Course  I have reviewed the triage vital signs and the nursing notes.  Pertinent labs & imaging results that were available during my care of the patient were reviewed by me and considered in my medical decision making (see chart for details).    MDM Rules/Calculators/A&P                           Patient presents with reported dizziness and abnormal EKG. When I reviewed the EKG from the outside facility, it likely is consistent with atrial fibrillation with a right bundle branch block. No obvious signs of AV block.  It is poor quality which limits the evaluation. At rest, patient is asymptomatic.  He has no focal neurodeficits while in the bed.  However he did have an isolated episode of hypotension just prior to getting to the room.  Will check orthostatics and have him ambulate.  Patient does have mild AKI. 5:02 AM Pt improved He ambulated without difficulty Vitals have  improved Suspicion that patient is having runs of afib that could be contributing to his dizziness Pt denied any focal weakness No syncope Pt likely mild dehydration from recent diarrhea Patient is requesting discharge home.  I have placed a referral for cardiology as he may need better heart rate control.  He will also need PCP follow-up to ensure his AKI is improved  Final Clinical Impression(s) / ED Diagnoses Final diagnoses:  Paroxysmal atrial fibrillation ([place])  Dehydration    Rx / DC Orders ED Discharge Orders          Ordered    Ambulatory referral to Cardiology        09/21/20 0429             Ripley Fraise, MD 09/21/20 [phone]

## 2020-09-22 DIAGNOSIS — J45909 Unspecified asthma, uncomplicated: Secondary | ICD-10-CM | POA: Insufficient documentation

## 2020-09-22 DIAGNOSIS — D649 Anemia, unspecified: Secondary | ICD-10-CM | POA: Insufficient documentation

## 2020-09-22 DIAGNOSIS — Z8489 Family history of other specified conditions: Secondary | ICD-10-CM | POA: Insufficient documentation

## 2020-09-22 DIAGNOSIS — M1990 Unspecified osteoarthritis, unspecified site: Secondary | ICD-10-CM | POA: Insufficient documentation

## 2020-09-22 DIAGNOSIS — H409 Unspecified glaucoma: Secondary | ICD-10-CM | POA: Insufficient documentation

## 2020-09-24 DIAGNOSIS — I10 Essential (primary) hypertension: Secondary | ICD-10-CM | POA: Diagnosis not present

## 2020-09-24 DIAGNOSIS — Z1331 Encounter for screening for depression: Secondary | ICD-10-CM | POA: Diagnosis not present

## 2020-09-24 DIAGNOSIS — E663 Overweight: Secondary | ICD-10-CM | POA: Diagnosis not present

## 2020-09-24 DIAGNOSIS — I25119 Atherosclerotic heart disease of native coronary artery with unspecified angina pectoris: Secondary | ICD-10-CM | POA: Diagnosis not present

## 2020-09-24 DIAGNOSIS — Z6828 Body mass index (BMI) 28.0-28.9, adult: Secondary | ICD-10-CM | POA: Diagnosis not present

## 2020-09-24 DIAGNOSIS — D6869 Other thrombophilia: Secondary | ICD-10-CM | POA: Diagnosis not present

## 2020-09-24 DIAGNOSIS — N1832 Chronic kidney disease, stage 3b: Secondary | ICD-10-CM | POA: Diagnosis not present

## 2020-09-24 DIAGNOSIS — I5022 Chronic systolic (congestive) heart failure: Secondary | ICD-10-CM | POA: Diagnosis not present

## 2020-09-24 DIAGNOSIS — I480 Paroxysmal atrial fibrillation: Secondary | ICD-10-CM | POA: Diagnosis not present

## 2020-10-06 DIAGNOSIS — M25561 Pain in right knee: Secondary | ICD-10-CM | POA: Diagnosis not present

## 2020-10-06 DIAGNOSIS — E663 Overweight: Secondary | ICD-10-CM | POA: Diagnosis not present

## 2020-10-06 DIAGNOSIS — D6869 Other thrombophilia: Secondary | ICD-10-CM | POA: Diagnosis not present

## 2020-10-06 DIAGNOSIS — M1991 Primary osteoarthritis, unspecified site: Secondary | ICD-10-CM | POA: Diagnosis not present

## 2020-10-06 DIAGNOSIS — I480 Paroxysmal atrial fibrillation: Secondary | ICD-10-CM | POA: Diagnosis not present

## 2020-10-06 DIAGNOSIS — Z6828 Body mass index (BMI) 28.0-28.9, adult: Secondary | ICD-10-CM | POA: Diagnosis not present

## 2020-10-06 NOTE — Progress Notes (Signed)
Cardiology Office Note:    Date:  10/07/2020   ID:  [name], DOB 1933-05-06, MRN [id number]  PCP:  Street, [name], MD  Cardiologist:  Shirlee More, MD   Referring MD: Ripley Fraise, MD he has microcytic anemia as he had iron studies checked  ASSESSMENT:    1. Hypertensive heart disease with heart failure and stage 3b chronic kidney disease, unspecified heart failure type ([place])   2. Atypical atrial flutter (H[facility])   3. Chronic anticoagulation    PLAN:    In order of problems listed above:  Nicholas [name] is not doing well he has atypical atrial flutter with rapid rate respiratory decompensation with bronchospasm as well as heart failure.  I will discontinue beta-blocker and transition him to a rate limiting calcium channel blocker and start a low-dose diuretic to consciously with his CKD.  If he continues to do badly I think he would benefit from attempted cardioversion we will recheck an echocardiogram.  At this time I think he requires hospitalization.  He will continue his current anticoagulant I will ask his primary care physician to research that he has been evaluated for iron deficiency with anticoagulation microcytic anemia.  Next appointment 2 weeks   Medication Adjustments/Labs and Tests Ordered: Current medicines are reviewed at length with the patient today.  Concerns regarding medicines are outlined above.  Orders Placed This Encounter  Procedures   EKG 12-Lead   No orders of the defined types were placed in this encounter.    Chief Complaint  Patient presents with   Follow-up   Atrial Fibrillation    History of Present Illness:    Nicholas [name] is a 87 y.o. hypertension and CKD male who is being seen today for the evaluation of atrial fibrillation and flutter at the request of Ripley Fraise, MD. He was seen by my partner Dr. Geraldo Pitter November 2019 for congestive heart failure other diagnoses included hypertension CKD and history of TIA with atrial  fibrillation documented July 2020.  He was not anticoagulated at that time because of history of falls. He was discharged from [facility] 11/03/2017 with a discharge diagnosis of TIA. He was seen in [name] ED 09/21/2020 referred by urgent care with concern he could require pacemaker.  His EKG initially showed atrial fibrillation with right bundle branch block and subsequent EKG showed a regular rhythm and rate of 127 bpm felt to be sinus tachycardia more consistent with atypical atrial flutter.  He was discharged from the emergency room and was not initiated on anticoagulation laboratory studies showed a potassium 4.7 sodium 142 creatinine 1.94 GFR 34 cc hemoglobin 10.5 microcytic indices platelets 350,000 his high-sensitivity troponin was normal chest x-ray showed mild cardiomegaly no acute changes. Echocardiogram from 04/09/2005 10/2018 showed normal left ventricular function EF 55 to 60% right ventricle is normal in size function and pulmonary artery pressure he had mild mitral annular calcification otherwise normal.  His daughter is present participates in evaluation decision making Prior to his ED visit he was doing quite well he was active and is having no cardiovascular symptoms of palpitation edema or shortness of breath. Since that visit he has struggled he short of breath with activities his legs are swollen fortunately no orthopnea or PND his heart rates are variable as low as 60-80 but at x130 or greater and he remains in atypical atrial flutter with a rapid correction. He has been on long-term anticoagulation with reduced dose Eliquis age kidney disease and has had  no clinical bleeding but he has microcytic anemia. He has had no recurrent neurologic event They are very disappointed quality of their ED visit. Past Medical History:  Diagnosis Date   Anemia    none recent   Arthritis    Asthma    Chronic kidney disease    ckd stage 3   Family history of adverse reaction to  anesthesia    daughter has ponv    Glaucoma    Hypertension     Past Surgical History:  Procedure Laterality Date   COLONOSCOPY WITH PROPOFOL N/A 02/03/2015   Procedure: COLONOSCOPY WITH PROPOFOL;  Surgeon: Arta Silence, MD;  Location: [facility];  Service: Endoscopy;  Laterality: N/A;   KNEE ARTHROSCOPY Left    ROTATOR CUFF REPAIR Right    SHOULDER ARTHROSCOPY Left     Current Medications: Current Meds  Medication Sig   acetaminophen (TYLENOL) 500 MG tablet Take 500-1,000 mg by mouth every 8 (eight) hours as needed (for pain or headaches).    albuterol (PROVENTIL HFA;VENTOLIN HFA) 108 (90 Base) MCG/ACT inhaler Inhale 2 puffs into the lungs every 4 (four) hours as needed.    allopurinol (ZYLOPRIM) 100 MG tablet Take 100 mg by mouth daily.   amLODipine (NORVASC) 5 MG tablet Take 5 mg by mouth daily.   apixaban (ELIQUIS) 2.5 MG TABS tablet Take 1 tablet (2.5 mg total) by mouth 2 (two) times daily.   bisoprolol (ZEBETA) 5 MG tablet Take 5 mg by mouth daily.   Calcium Carb-Cholecalciferol (CALCIUM 600+D3 PO) Take 2 tablets by mouth daily.   Fluticasone-Salmeterol (ADVAIR) 250-50 MCG/DOSE AEPB Inhale 1 puff into the lungs 2 (two) times daily.   latanoprost (XALATAN) 0.005 % ophthalmic solution Place 1 drop into both eyes at bedtime.   Multiple Vitamin (MULTIVITAMIN WITH MINERALS) TABS tablet Take 1 tablet by mouth every morning.   Omega-3 Fatty Acids (FISH OIL) 1000 MG CAPS Take 1,000 mg by mouth daily.   pantoprazole (PROTONIX) 20 MG tablet Take 1 tablet (20 mg total) by mouth daily.     Allergies:   Penicillins   Social History   Socioeconomic History   Marital status: Widowed    Spouse name: Not on file   Number of children: 5   Years of education: 8   Highest education level: Not on file  Occupational History   Occupation: Retired   Tobacco Use   Smoking status: Former   Smokeless tobacco: Never   Tobacco comments:    quit 35 yrs ago  Substance and Sexual Activity    Alcohol use: No   Drug use: No   Sexual activity: Not on file  Other Topics Concern   Not on file  Social History Narrative   11/28/18 Lives with grandson, [name].    Caffeine use: daily   Right handed    Social Determinants of Health   Financial Resource Strain: Not on file  Food Insecurity: Not on file  Transportation Needs: Not on file  Physical Activity: Not on file  Stress: Not on file  Social Connections: Not on file     Family History: The patient's family history includes Hypertension in his father and mother.  ROS:   ROS Please see the history of present illness.     All other systems reviewed and are negative.  EKGs/Labs/Other Studies Reviewed:    The following studies were reviewed today:   EKG:  EKG is  ordered today.  The ekg ordered today is personally reviewed and demonstrates atypical  atrial flutter 2 1 conduction rate 130 bpm  Recent Labs: 09/20/2020: BUN 22; Creatinine, Ser 1.94; Hemoglobin 10.5; Platelets 350; Potassium 4.7; Sodium 142  Recent Lipid Panel    Component Value Date/Time   CHOL 111 07/20/2018 0623   CHOL 149 12/10/2017 1157   TRIG 61 07/20/2018 0623   HDL 37 (L) 07/20/2018 0623   HDL 40 12/10/2017 1157   CHOLHDL 3.0 07/20/2018 0623   VLDL 12 07/20/2018 0623   LDLCALC 62 07/20/2018 0623   LDLCALC 73 12/10/2017 1157    Physical Exam:    VS:  BP 128/80 (BP Location: Left Arm, Patient Position: Sitting)   Pulse (!) 131   Ht 5\' 9"  (1.753 m)   Wt 208 lb 12.8 oz (94.7 kg)   SpO2 96%   BMI 30.83 kg/m     Wt Readings from Last 3 Encounters:  10/07/20 208 lb 12.8 oz (94.7 kg)  09/21/20 201 lb (91.2 kg)  12/13/18 193 lb (87.5 kg)     GEN: He looks frail he is breathless at rest  HEENT: Normal NECK: Mild NVD; No carotid bruits LYMPHATICS: No lymphadenopathy CARDIAC: Rapid irregular rhythm distant heart sounds  RESPIRATORY: Diminished breath sounds prolonged expiration he has expiratory wheezing received a steroid injection  yesterday ABDOMEN: Soft, non-tender, non-distended MUSCULOSKELETAL: 2-3+ bilateral lower extremity pitting edema appearance is not typical for calcium channel blocker edema; No deformity  SKIN: Warm and dry NEUROLOGIC:  Alert and oriented x 3 PSYCHIATRIC:  Normal affect     Signed, Shirlee More, MD  10/07/2020 10:38 AM    [place]

## 2020-10-07 ENCOUNTER — Encounter: Payer: Self-pay | Admitting: Cardiology

## 2020-10-07 ENCOUNTER — Ambulatory Visit: Payer: PPO | Admitting: Cardiology

## 2020-10-07 ENCOUNTER — Other Ambulatory Visit: Payer: Self-pay

## 2020-10-07 VITALS — BP 128/80 | HR 131 | Ht 69.0 in | Wt 208.8 lb

## 2020-10-07 DIAGNOSIS — I484 Atypical atrial flutter: Secondary | ICD-10-CM | POA: Diagnosis not present

## 2020-10-07 DIAGNOSIS — R0600 Dyspnea, unspecified: Secondary | ICD-10-CM | POA: Diagnosis not present

## 2020-10-07 DIAGNOSIS — I130 Hypertensive heart and chronic kidney disease with heart failure and stage 1 through stage 4 chronic kidney disease, or unspecified chronic kidney disease: Secondary | ICD-10-CM

## 2020-10-07 DIAGNOSIS — N1832 Chronic kidney disease, stage 3b: Secondary | ICD-10-CM | POA: Diagnosis not present

## 2020-10-07 DIAGNOSIS — Z7901 Long term (current) use of anticoagulants: Secondary | ICD-10-CM | POA: Diagnosis not present

## 2020-10-07 DIAGNOSIS — R0609 Other forms of dyspnea: Secondary | ICD-10-CM

## 2020-10-07 MED ORDER — FUROSEMIDE 20 MG PO TABS: 20.0000 mg | ORAL_TABLET | Freq: Every day | ORAL | 3 refills | Status: DC

## 2020-10-07 MED ORDER — DILTIAZEM HCL 30 MG PO TABS: 30.0000 mg | ORAL_TABLET | Freq: Three times a day (TID) | ORAL | 3 refills | Status: DC

## 2020-10-07 NOTE — Patient Instructions (Addendum)
Medication Instructions:  Your physician has recommended you make the following change in your medication:  STOP: Zebeta START: Cardizem 30 mg take one tablet by mouth three times daily.  START: Furosemide 20 mg take one tablet by mouth daily.  STOP: Amlodipine *If you need a refill on your cardiac medications before your next appointment, please call your pharmacy*   Lab Work: Your physician recommends that you return for lab work in: 1 week BMP, ProBNP If you have labs (blood work) drawn today and your tests are completely normal, you will receive your results only by: [place] (if you have [place]) OR A paper copy in the mail If you have any lab test that is abnormal or we need to change your treatment, we will call you to review the results.   Testing/Procedures: Your physician has requested that you have an echocardiogram. Echocardiography is a painless test that uses sound waves to create images of your heart. It provides your doctor with information about the size and shape of your heart and how well your heart's chambers and valves are working. This procedure takes approximately one hour. There are no restrictions for this procedure.    Follow-Up: At [facility], you and your health needs are our priority.  As part of our continuing mission to provide you with exceptional heart care, we have created designated Provider Care Teams.  These Care Teams include your primary Cardiologist (physician) and Advanced Practice Providers (APPs -  Physician Assistants and Nurse Practitioners) who all work together to provide you with the care you need, when you need it.  We recommend signing up for the patient portal called "MyChart".  Sign up information is provided on this After Visit Summary.  MyChart is used to connect with patients for Virtual Visits (Telemedicine).  Patients are able to view lab/test results, encounter notes, upcoming appointments, etc.  Non-urgent messages can be  sent to your provider as well.   To learn more about what you can do with MyChart, go to [email or web address].    Your next appointment:   2 week(s)  The format for your next appointment:   In Person  Provider:   Shirlee More, MD   Other Instructions

## 2020-10-08 DIAGNOSIS — E785 Hyperlipidemia, unspecified: Secondary | ICD-10-CM | POA: Diagnosis not present

## 2020-10-08 DIAGNOSIS — I10 Essential (primary) hypertension: Secondary | ICD-10-CM | POA: Diagnosis not present

## 2020-10-15 DIAGNOSIS — Z23 Encounter for immunization: Secondary | ICD-10-CM | POA: Diagnosis not present

## 2020-10-15 DIAGNOSIS — N1832 Chronic kidney disease, stage 3b: Secondary | ICD-10-CM | POA: Diagnosis not present

## 2020-10-15 DIAGNOSIS — I130 Hypertensive heart and chronic kidney disease with heart failure and stage 1 through stage 4 chronic kidney disease, or unspecified chronic kidney disease: Secondary | ICD-10-CM | POA: Diagnosis not present

## 2020-10-15 DIAGNOSIS — R0600 Dyspnea, unspecified: Secondary | ICD-10-CM | POA: Diagnosis not present

## 2020-10-15 DIAGNOSIS — I484 Atypical atrial flutter: Secondary | ICD-10-CM | POA: Diagnosis not present

## 2020-10-16 LAB — BASIC METABOLIC PANEL
BUN/Creatinine Ratio: 12 (ref 10–24)
BUN: 21 mg/dL (ref 8–27)
CO2: 21 mmol/L (ref 20–29)
Calcium: 8.7 mg/dL (ref 8.6–10.2)
Chloride: 106 mmol/L (ref 96–106)
Creatinine, Ser: 1.82 mg/dL — ABNORMAL HIGH (ref 0.76–1.27)
Glucose: 107 mg/dL — ABNORMAL HIGH (ref 70–99)
Potassium: 4 mmol/L (ref 3.5–5.2)
Sodium: 142 mmol/L (ref 134–144)
eGFR: 36 mL/min/{1.73_m2} — ABNORMAL LOW (ref >59)

## 2020-10-16 LAB — PRO B NATRIURETIC PEPTIDE: NT-Pro BNP: 5768 pg/mL — ABNORMAL HIGH (ref 0–486)

## 2020-10-18 ENCOUNTER — Telehealth: Payer: Self-pay

## 2020-10-18 NOTE — Telephone Encounter (Signed)
-----   Message from Richardo Priest, MD sent at 10/17/2020 11:37 AM EDT ----- Stable CKD and potassium  I increased diuretic last visit

## 2020-10-18 NOTE — Telephone Encounter (Signed)
Spoke with patient regarding results and recommendation.  Patient verbalizes understanding and is agreeable to plan of care. Advised patient to call back with any issues or concerns.  

## 2020-10-22 DIAGNOSIS — Z23 Encounter for immunization: Secondary | ICD-10-CM | POA: Diagnosis not present

## 2020-10-25 ENCOUNTER — Ambulatory Visit (INDEPENDENT_AMBULATORY_CARE_PROVIDER_SITE_OTHER): Payer: PPO

## 2020-10-25 ENCOUNTER — Other Ambulatory Visit: Payer: Self-pay

## 2020-10-25 DIAGNOSIS — R0609 Other forms of dyspnea: Secondary | ICD-10-CM | POA: Diagnosis not present

## 2020-10-25 DIAGNOSIS — I484 Atypical atrial flutter: Secondary | ICD-10-CM

## 2020-10-25 DIAGNOSIS — N1832 Chronic kidney disease, stage 3b: Secondary | ICD-10-CM | POA: Diagnosis not present

## 2020-10-25 DIAGNOSIS — I130 Hypertensive heart and chronic kidney disease with heart failure and stage 1 through stage 4 chronic kidney disease, or unspecified chronic kidney disease: Secondary | ICD-10-CM | POA: Diagnosis not present

## 2020-10-25 LAB — ECHOCARDIOGRAM COMPLETE
AR max vel: 1.52 cm2
AV Area VTI: 1.82 cm2
AV Area mean vel: 1.43 cm2
AV Mean grad: 17 mmHg
AV Peak grad: 28 mmHg
Ao pk vel: 2.65 m/s
Area-P 1/2: 8.25 cm2
Calc EF: 37.9 %
S' Lateral: 3.9 cm
Single Plane A2C EF: 39.5 %
Single Plane A4C EF: 37.4 %

## 2020-10-26 ENCOUNTER — Telehealth: Payer: Self-pay

## 2020-10-26 NOTE — Telephone Encounter (Signed)
-----   Message from Richardo Priest, MD sent at 10/26/2020  8:12 AM EDT ----- Heart muscle function is decreased I started him on a diuretic continue the same

## 2020-10-26 NOTE — Progress Notes (Signed)
Cardiology Office Note:    Date:  10/27/2020   ID:  Nicholas [name], DOB February 17, 1933, MRN [id number]  PCP:  Street, Nicholas Mt, MD  Cardiologist:  Shirlee More, MD    Referring MD: [address] Nicholas [name], *    ASSESSMENT:    1. Atypical atrial flutter (Nicholas [name])   2. Chronic anticoagulation   3. 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  With his CKD and anemia recheck those labs and with exophthalmos check his thyroid test regarding hypothyroidism.  If ineffective load with amiodarone and repeat cardioversion. Improved he has no evidence of fluid overload once sinus rhythm has been resumed consider directed drug therapy SGLT2 inhibitor and Entresto.   Next appointment: 4 weeks   Medication Adjustments/Labs and Tests Ordered: Current medicines are reviewed at length with the patient today.  Concerns regarding medicines are outlined above.  No orders of the defined types were placed in this encounter.  No orders of the defined types were placed in this encounter.   Chief Complaint  Patient presents with   Follow-up   Atrial Flutter   Congestive Heart Failure    History of Present Illness:    Nicholas [name] is a 87 y.o. male with a hx of hypertension with chronic kidney disease stage IIIb heart failure with mildly reduced ejection fraction 40 to 45% chronic anticoagulation and atrial flutter.  He was last seen 10/07/2020 with decompensated heart failure bronchospasm rapid ventricular rate beta-blocker was discontinued and transition to a rate limiting calcium channel blocker and initiated discussion of cardioversion if  he did not respond to medical therapy.  He was also found to have microcytic anemia Note to his primary care doctor regarding whether has been evaluated for iron deficiency that contributes to heart failure.Marland KitchenHe was seen in Nicholas [name], ED 09/21/2020 referred by urgent care with concern he could require pacemaker.  His EKG initially showed atrial fibrillation with right bundle branch block and subsequent EKG showed a regular rhythm and rate of 127 bpm felt to be sinus tachycardia more consistent with atypical atrial flutter.  He was discharged from the emergency room and was not initiated on anticoagulation laboratory studies showed a potassium 4.7 sodium 142 creatinine 1.94 GFR 34 cc hemoglobin 10.5 microcytic indices platelets 350,000 his high-sensitivity troponin was normal chest x-ray showed mild cardiomegaly no acute changes.  Echocardiogram from 04/09/2005 10/2018 showed normal left ventricular function EF 55 to 60% right ventricle is normal in size function and pulmonary artery pressure he had mild mitral annular calcification otherwise normal. Compliance with diet, lifestyle and medications: Yes  His daughter is present reports is present decision making and evaluation. I reviewed the echo findings with him. He is closely supervised at home his heart rates are now running in the range of 110 to 120 bpm His bronchospasm is improved He is less short of breath but is still breathless even ambulating in room to room in the home no edema orthopnea. Both the patient and his daughter feel that he is not the same than he was before the atrial flutter they understand he has a tachycardia induced cardiomyopathy continues to have rapid heart rate in 1 to pursue restoration  of sinus rhythm. I reviewed the options risk and benefits the plan cardioversion next week with informed consent if ineffective initiate amiodarone.  I think with atypical atrial flutter and his age I be hesitant to refer him for catheter  ablation at this time His meds are supervises been compliant with his reduced dose anticoagulant because of age and CKD  Echo 10/25/2020:  1. Rapid atrial fibrillation/ flutter. Left ventricular ejection  fraction, by estimation, is 40 to 45%. The left ventricle has mildly  decreased function. The left ventricle has no regional wall motion  abnormalities. There is moderate concentric left  ventricular hypertrophy. Left ventricular diastolic parameters are  indeterminate.   2. Right ventricular systolic function is normal. The right ventricular  size is normal. There is moderately elevated pulmonary artery systolic  pressure.   3. Left atrial size was moderately dilated.   4. The mitral valve is normal in structure. Mild mitral valve  regurgitation. No evidence of mitral stenosis.   5. The aortic valve is tricuspid. There is mild calcification of the  aortic valve. There is mild thickening of the aortic valve. Aortic valve  regurgitation is mild. Mild aortic valve stenosis.   6. There is moderate dilatation of the ascending aorta, measuring 39 mm.   7. The inferior vena cava is dilated in size with <50% respiratory  variability, suggesting right atrial pressure of 15 mmHg.   Component Ref Range & Units 12 d ago   NT-Pro BNP 0 - 486 pg/mL 5,768 High     Past Medical History:  Diagnosis Date   Anemia    none recent   Arthritis    Asthma    Chronic kidney disease    ckd stage 3   Family history of adverse reaction to anesthesia    daughter has ponv    Glaucoma    Hypertension     Past Surgical History:  Procedure Laterality Date   COLONOSCOPY WITH PROPOFOL N/A 02/03/2015   Procedure: COLONOSCOPY WITH PROPOFOL;  Surgeon: Arta Silence, MD;  Location: [facility];  Service: Endoscopy;  Laterality: N/A;   KNEE ARTHROSCOPY Left    ROTATOR CUFF REPAIR Right    SHOULDER ARTHROSCOPY Left     Current Medications: Current Meds  Medication Sig   acetaminophen (TYLENOL) 500 MG  tablet Take 500-1,000 mg by mouth every 8 (eight) hours as needed (for pain or headaches).    albuterol (PROVENTIL HFA;VENTOLIN HFA) 108 (90 Base) MCG/ACT inhaler Inhale 2 puffs into the lungs every 4 (four) hours as needed for wheezing or shortness of breath.   allopurinol (ZYLOPRIM) 100 MG tablet Take 100 mg by mouth daily.   amLODipine (NORVASC) 5 MG tablet Take 5 mg by mouth daily.   apixaban (ELIQUIS) 2.5 MG TABS tablet Take 1 tablet (2.5 mg total) by mouth 2 (two) times daily.   Calcium Carb-Cholecalciferol (CALCIUM 600+D3 PO) Take 2 tablets by mouth daily.   diltiazem (CARDIZEM) 30 MG tablet Take 1 tablet (30 mg total) by mouth 3 (three) times daily.   Fluticasone-Salmeterol (ADVAIR) 250-50 MCG/DOSE AEPB Inhale 1 puff into the lungs 2 (two) times daily.   furosemide (LASIX) 20 MG tablet Take 1 tablet (20 mg total) by mouth daily.   latanoprost (XALATAN) 0.005 % ophthalmic solution Place 1 drop into both eyes at bedtime.   Multiple Vitamin (MULTIVITAMIN WITH MINERALS) TABS tablet Take 1 tablet by mouth every morning.   Omega-3 Fatty Acids (FISH OIL) 1000 MG CAPS Take 1,000 mg by mouth daily.  pantoprazole (PROTONIX) 20 MG tablet Take 1 tablet (20 mg total) by mouth daily.     Allergies:   Penicillins   Social History   Socioeconomic History   Marital status: Widowed    Spouse name: Not on file   Number of children: 5   Years of education: 8   Highest education level: Not on file  Occupational History   Occupation: Retired   Tobacco Use   Smoking status: Former   Smokeless tobacco: Never   Tobacco comments:    quit 35 yrs ago  Substance and Sexual Activity   Alcohol use: No   Drug use: No   Sexual activity: Not on file  Other Topics Concern   Not on file  Social History Narrative   11/28/18 Lives with grandson, [name].    Caffeine use: daily   Right handed    Social Determinants of Health   Financial Resource Strain: Not on file  Food Insecurity: Not on file   Transportation Needs: Not on file  Physical Activity: Not on file  Stress: Not on file  Social Connections: Not on file     Family History: The patient's family history includes Hypertension in his father and mother. ROS:   Please see the history of present illness.    All other systems reviewed and are negative.  EKGs/Labs/Other Studies Reviewed:    The following studies were reviewed today:  EKG:  EKG ordered today and personally reviewed.  The ekg ordered today demonstrates atrial flutter ventricular rate 139 bpm.  Recent Labs: 09/20/2020: Hemoglobin 10.5; Platelets 350 10/15/2020: BUN 21; Creatinine, Ser 1.82; NT-Pro BNP 5,768; Potassium 4.0; Sodium 142  Recent Lipid Panel    Component Value Date/Time   CHOL 111 07/20/2018 0623   CHOL 149 12/10/2017 1157   TRIG 61 07/20/2018 0623   HDL 37 (L) 07/20/2018 0623   HDL 40 12/10/2017 1157   CHOLHDL 3.0 07/20/2018 0623   VLDL 12 07/20/2018 0623   LDLCALC 62 07/20/2018 0623   LDLCALC 73 12/10/2017 1157    Physical Exam:    VS:  BP 120/66   Pulse (!) 116   Wt 200 lb 3.2 oz (90.8 kg)   SpO2 95%   BMI 29.56 kg/m     Wt Readings from Last 3 Encounters:  10/27/20 200 lb 3.2 oz (90.8 kg)  10/07/20 208 lb 12.8 oz (94.7 kg)  09/21/20 201 lb (91.2 kg)     GEN:  Well nourished, well developed in no acute distress HEENT: Normal NECK: No JVD; No carotid bruits LYMPHATICS: No lymphadenopathy CARDIAC: Irregular rate and rhythm no murmurs, rubs, gallops RESPIRATORY:  Clear to auscultation without rales, wheezing or rhonchi  ABDOMEN: Soft, non-tender, non-distended MUSCULOSKELETAL:  No edema; No deformity  SKIN: Warm and dry NEUROLOGIC:  Alert and oriented x 3 PSYCHIATRIC:  Normal affect    Signed, Shirlee More, MD  10/27/2020 9:13 AM    [place]

## 2020-10-26 NOTE — Telephone Encounter (Signed)
Left message on patients voicemail to please return our call.   

## 2020-10-26 NOTE — Telephone Encounter (Signed)
Spoke with patient regarding results and recommendation.  Patient verbalizes understanding and is agreeable to plan of care. Advised patient to call back with any issues or concerns.  

## 2020-10-27 ENCOUNTER — Encounter: Payer: Self-pay | Admitting: Cardiology

## 2020-10-27 ENCOUNTER — Other Ambulatory Visit: Payer: Self-pay

## 2020-10-27 ENCOUNTER — Ambulatory Visit: Payer: PPO | Admitting: Cardiology

## 2020-10-27 VITALS — BP 120/66 | HR 116 | Wt 200.2 lb

## 2020-10-27 DIAGNOSIS — I5042 Chronic combined systolic (congestive) and diastolic (congestive) heart failure: Secondary | ICD-10-CM

## 2020-10-27 DIAGNOSIS — Z7901 Long term (current) use of anticoagulants: Secondary | ICD-10-CM

## 2020-10-27 DIAGNOSIS — N1832 Chronic kidney disease, stage 3b: Secondary | ICD-10-CM

## 2020-10-27 DIAGNOSIS — I484 Atypical atrial flutter: Secondary | ICD-10-CM

## 2020-10-27 DIAGNOSIS — I130 Hypertensive heart and chronic kidney disease with heart failure and stage 1 through stage 4 chronic kidney disease, or unspecified chronic kidney disease: Secondary | ICD-10-CM

## 2020-10-27 NOTE — Patient Instructions (Signed)
Medication Instructions:  Your physician recommends that you continue on your current medications as directed. Please refer to the Current Medication list given to you today.  *If you need a refill on your cardiac medications before your next appointment, please call your pharmacy*   Lab Work: Your physician recommends that you return for lab work in: TODAY BMP, CBC, ProBNP, TSH, T3, T4 If you have labs (blood work) drawn today and your tests are completely normal, you will receive your results only by: [place] (if you have [place]) OR A paper copy in the mail If you have any lab test that is abnormal or we need to change your treatment, we will call you to review the results.   Testing/Procedures: You are scheduled for a Cardioversion on _____ with Dr. Agustin Cree.  Please arrive at the [facility]  at _____ am/pm. (1 hour prior to procedure)  DIET: Nothing to eat or drink after midnight except a sip of water with medications (see medication instructions below)  FYI: For your safety, and to allow [place] to monitor your vital signs accurately during the surgery/procedure we request that   if you have artificial nails, gel coating, SNS etc. Please have those removed prior to your surgery/procedure. Not having the nail coverings /polish removed may result in cancellation or delay of your surgery/procedure.   Medication Instructions:  On the morning of the procedure take your cardizem and your eliquis.    Labs: YOUR LABS WERE COMPLETED TODAY 10/27/2020  You must have a responsible person to drive you home and stay in the waiting area during your procedure. Failure to do so could result in cancellation.  Bring your insurance cards.  *Special Note: Every effort is made to have your procedure done on time. Occasionally there are emergencies that occur at the hospital that may cause delays. Please be patient if a delay does occur.     Follow-Up: At [facility], you and your health needs are our priority.  As part of our continuing mission to provide you with exceptional heart care, we have created designated Provider Care Teams.  These Care Teams include your primary Cardiologist (physician) and Advanced Practice Providers (APPs -  Physician Assistants and Nurse Practitioners) who all work together to provide you with the care you need, when you need it.  We recommend signing up for the patient portal called "MyChart".  Sign up information is provided on this After Visit Summary.  MyChart is used to connect with patients for Virtual Visits (Telemedicine).  Patients are able to view lab/test results, encounter notes, upcoming appointments, etc.  Non-urgent messages can be sent to your provider as well.   To learn more about what you can do with MyChart, go to [email or web address].    Your next appointment:   4 week(s)  The format for your next appointment:   In Person  Provider:   Shirlee More, MD   Other Instructions

## 2020-10-28 ENCOUNTER — Telehealth: Payer: Self-pay | Admitting: Oncology

## 2020-10-28 ENCOUNTER — Telehealth: Payer: Self-pay

## 2020-10-28 DIAGNOSIS — D649 Anemia, unspecified: Secondary | ICD-10-CM

## 2020-10-28 LAB — BASIC METABOLIC PANEL
BUN/Creatinine Ratio: 13 (ref 10–24)
BUN: 25 mg/dL (ref 8–27)
CO2: 21 mmol/L (ref 20–29)
Calcium: 9 mg/dL (ref 8.6–10.2)
Chloride: 105 mmol/L (ref 96–106)
Creatinine, Ser: 1.89 mg/dL — ABNORMAL HIGH (ref 0.76–1.27)
Glucose: 97 mg/dL (ref 70–99)
Potassium: 4.5 mmol/L (ref 3.5–5.2)
Sodium: 142 mmol/L (ref 134–144)
eGFR: 35 mL/min/{1.73_m2} — ABNORMAL LOW (ref >59)

## 2020-10-28 LAB — CBC
Hematocrit: 31.1 % — ABNORMAL LOW (ref 37.5–51.0)
Hemoglobin: 9.2 g/dL — ABNORMAL LOW (ref 13.0–17.7)
MCH: 20.5 pg — ABNORMAL LOW (ref 26.6–33.0)
MCHC: 29.6 g/dL — ABNORMAL LOW (ref 31.5–35.7)
MCV: 69 fL — ABNORMAL LOW (ref 79–97)
Platelets: 356 10*3/uL (ref 150–450)
RBC: 4.48 x10E6/uL (ref 4.14–5.80)
RDW: 15 % (ref 11.6–15.4)
WBC: 8.8 10*3/uL (ref 3.4–10.8)

## 2020-10-28 LAB — TSH+T4F+T3FREE
Free T4: 1.32 ng/dL (ref 0.82–1.77)
T3, Free: 2.7 pg/mL (ref 2.0–4.4)
TSH: 2.82 u[IU]/mL (ref 0.450–4.500)

## 2020-10-28 LAB — PRO B NATRIURETIC PEPTIDE: NT-Pro BNP: 6081 pg/mL — ABNORMAL HIGH (ref 0–486)

## 2020-10-28 NOTE — Telephone Encounter (Signed)
I had a voicemail on my line from 8:22am with pt's son returning a call, seems like he was incorrectly forwarded to my line. Looks like you spoke with his daughter about 10 mins later, not sure if the son still needs to be called or not but just wanted to forward as an [place].

## 2020-10-28 NOTE — Telephone Encounter (Signed)
Spoke with patients daughter regarding results and recommendation.  She verbalizes understanding and is agreeable to plan of care. Advised for the patient to call back with any issues or concerns.

## 2020-10-28 NOTE — Telephone Encounter (Signed)
Scheduled appt per 10/19 referral. Spoke to pt's grandson who is aware of appt date and time.

## 2020-10-28 NOTE — Telephone Encounter (Signed)
-----   Message from Richardo Priest, MD sent at 10/28/2020  7:53 AM EDT ----- He is increasingly anemic I think we should cancel cardioversion He appears to be iron deficient I would like him seen by hematology at [facility] as quickly as possible and he will require diagnosis and treatment before I would do a cardioversion. If his hemoglobin drops further we need to stop his anticoagulant putting him at risk of stroke.

## 2020-10-28 NOTE — Telephone Encounter (Signed)
Tried calling the patients daughter as requested but it went to voicemail and her mailbox was full.   I called the home number and Nicholas [name] did answer but could not hear me and asked me to call his son.   I called his son and got his voicemail so I left a message for him to call us back.

## 2020-10-28 NOTE — Telephone Encounter (Signed)
-----   Message from Nicholas Priest, MD sent at 10/28/2020  7:53 AM EDT ----- He is increasingly anemic I think we should cancel cardioversion He appears to be iron deficient I would like him seen by hematology at [facility] as quickly as possible and he will require diagnosis and treatment before I would do a cardioversion. If his hemoglobin drops further we need to stop his anticoagulant putting him at risk of stroke.

## 2020-11-03 ENCOUNTER — Other Ambulatory Visit: Payer: Self-pay | Admitting: Oncology

## 2020-11-03 DIAGNOSIS — D508 Other iron deficiency anemias: Secondary | ICD-10-CM

## 2020-11-03 NOTE — Progress Notes (Signed)
[facility]  [address] [place],  [place]  [address] [phone]  Clinic Day:  11/04/2020  Referring physician: Emmaline Kluver, *   HISTORY OF PRESENT ILLNESS:  The patient is a 87 y.o. male who I was asked to consult upon for anemia.  Labs 1 week ago showed a low hemoglobin of 9.2, with a low MCV of 69.  In the past, his MCV levels were in the mid 80's.  The patient denies having any overt forms of blood loss to explain his anemia.  He claims to have had a colonoscopy 3-4 years ago, which apparently came back normal.  The patient claims he has been taking a fourth of an iron tablet every other day for the past few months.  He denies there being a family history of anemia or other hematologic disorders.    PAST MEDICAL HISTORY:   Past Medical History:  Diagnosis Date   Anemia    none recent   Arthritis    Asthma    Chronic kidney disease    ckd stage 3   Family history of adverse reaction to anesthesia    daughter has ponv    Glaucoma    Hypertension   Atrial fibrillation COPD  PAST SURGICAL HISTORY:   Past Surgical History:  Procedure Laterality Date   COLONOSCOPY WITH PROPOFOL N/A 02/03/2015   Procedure: COLONOSCOPY WITH PROPOFOL;  Surgeon: Arta Silence, MD;  Location: [facility];  Service: Endoscopy;  Laterality: N/A;   KNEE ARTHROSCOPY Left    ROTATOR CUFF REPAIR Right    SHOULDER ARTHROSCOPY Left     CURRENT MEDICATIONS:   Current Outpatient Medications  Medication Sig Dispense Refill   acetaminophen (TYLENOL) 500 MG tablet Take 500-1,000 mg by mouth every 8 (eight) hours as needed (for pain or headaches).      albuterol (PROVENTIL HFA;VENTOLIN HFA) 108 (90 Base) MCG/ACT inhaler Inhale 2 puffs into the lungs every 4 (four) hours as needed for wheezing or shortness of breath.     allopurinol (ZYLOPRIM) 100 MG tablet Take 100 mg by mouth daily.     amLODipine (NORVASC) 5 MG tablet Take 5 mg by mouth daily.      apixaban (ELIQUIS) 2.5 MG TABS tablet Take 1 tablet (2.5 mg total) by mouth 2 (two) times daily. 60 tablet 0   Calcium Carb-Cholecalciferol (CALCIUM 600+D3 PO) Take 2 tablets by mouth daily.     diltiazem (CARDIZEM) 30 MG tablet Take 1 tablet (30 mg total) by mouth 3 (three) times daily. 270 tablet 3   Fluticasone-Salmeterol (ADVAIR) 250-50 MCG/DOSE AEPB Inhale 1 puff into the lungs 2 (two) times daily.     furosemide (LASIX) 20 MG tablet Take 1 tablet (20 mg total) by mouth daily. 90 tablet 3   latanoprost (XALATAN) 0.005 % ophthalmic solution Place 1 drop into both eyes at bedtime.     Multiple Vitamin (MULTIVITAMIN WITH MINERALS) TABS tablet Take 1 tablet by mouth every morning.     Omega-3 Fatty Acids (FISH OIL) 1000 MG CAPS Take 1,000 mg by mouth daily.     pantoprazole (PROTONIX) 20 MG tablet Take 1 tablet (20 mg total) by mouth daily. 30 tablet 3   No current facility-administered medications for this visit.    ALLERGIES:   Allergies  Allergen Reactions   Penicillins Hives    Childhood allergy Has patient had a PCN reaction causing immediate rash, facial/tongue/throat swelling, SOB or lightheadedness with hypotension: Yes Has patient had a PCN  reaction causing severe rash involving mucus membranes or skin necrosis: Yes Has patient had a PCN reaction that required hospitalization: Yes Has patient had a PCN reaction occurring within the last 10 years: No If all of the above answers are "NO", then may proceed with Cephalosporin use.     FAMILY HISTORY:   Family History  Problem Relation Age of Onset   Hypertension Mother    Hypertension Father   His sister died from metastatic breast cancer.    SOCIAL HISTORY:  The patient was born and raised in [place].  He lives in [place].  He is widowed, with 6 children, multiple grand, great-grand, and great-great-grandchildren.  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  He smoked a pack of cigarettes daly x 27 years, but quit  smoking 40 years ago.  There is no history of alcohol abuse.    REVIEW OF SYSTEMS:  Review of Systems  Constitutional:  Positive for fatigue. Negative for fever and unexpected weight change.  HENT:   Positive for hearing loss.   Respiratory:  Positive for cough and shortness of breath. Negative for chest tightness and hemoptysis.   Cardiovascular:  Positive for palpitations. Negative for chest pain.  Gastrointestinal:  Positive for constipation. Negative for abdominal distention, abdominal pain, blood in stool, diarrhea, nausea and vomiting.  Genitourinary:  Negative for dysuria, frequency and hematuria.   Musculoskeletal:  Negative for arthralgias, back pain and myalgias.  Skin:  Negative for itching and rash.  Neurological:  Negative for dizziness, headaches and light-headedness.  Psychiatric/Behavioral:  Negative for depression and suicidal ideas. The patient is not nervous/anxious.     PHYSICAL EXAM:  Blood pressure 131/79, pulse (!) 136, temperature 97.8 F (36.6 C), resp. rate 16, height 5\' 9"  (1.753 m), weight 199 lb 12.8 oz (90.6 kg), SpO2 97 %. Wt Readings from Last 3 Encounters:  11/04/20 199 lb 12.8 oz (90.6 kg)  10/27/20 200 lb 3.2 oz (90.8 kg)  10/07/20 208 lb 12.8 oz (94.7 kg)   Body mass index is 29.51 kg/m. Performance status (ECOG): 1 - Symptomatic but completely ambulatory Physical Exam Constitutional:      Appearance: Normal appearance. He is not ill-appearing.  HENT:     Mouth/Throat:     Mouth: Mucous membranes are moist.     Pharynx: Oropharynx is clear. No oropharyngeal exudate or posterior oropharyngeal erythema.  Cardiovascular:     Rate and Rhythm: Normal rate and regular rhythm.     Heart sounds: No murmur heard.   No friction rub. No gallop.  Pulmonary:     Effort: Pulmonary effort is normal. No respiratory distress.     Breath sounds: Normal breath sounds. No wheezing, rhonchi or rales.  Abdominal:     General: Bowel sounds are normal. There is no  distension.     Palpations: Abdomen is soft. There is no mass.     Tenderness: There is no abdominal tenderness.  Musculoskeletal:        General: No swelling.     Right lower leg: No edema.     Left lower leg: No edema.  Lymphadenopathy:     Cervical: No cervical adenopathy.     Upper Body:     Right upper body: No supraclavicular or axillary adenopathy.     Left upper body: No supraclavicular or axillary adenopathy.     Lower Body: No right inguinal adenopathy. No left inguinal adenopathy.  Skin:    General: Skin is warm.  Coloration: Skin is not jaundiced.     Findings: No lesion or rash.  Neurological:     General: No focal deficit present.     Mental Status: He is alert and oriented to person, place, and time. Mental status is at baseline.  Psychiatric:        Mood and Affect: Mood normal.        Behavior: Behavior normal.        Thought Content: Thought content normal.   LABS:    Ref. Range 11/04/2020 10:16  Iron Latest Ref Range: 45 - 182 ug/dL 16 (L)  UIBC Latest Units: ug/dL 406  TIBC Latest Ref Range: 250 - 450 ug/dL 422  Saturation Ratios Latest Ref Range: 17.9 - 39.5 % 4 (L)  Ferritin Latest Ref Range: 24 - 336 ng/mL 5 (L)  Folate Latest Ref Range: >5.9 ng/mL 44.1  Vitamin B12 Latest Ref Range: 180 - 914 pg/mL 307   ASSESSMENT & PLAN:  An 87 y.o. male whose labs clearly reflect iron deficiency anemia.  I will arrange for him to receive IV iron over these next few weeks to rapidly bolster his iron stores and improve his hemoglobin. As his labs are in stark contrast to what they were 3 years ago, I do feel this gentleman needs to be re-evaluated by GI to ensure his iron deficiency anemia is not due to any ominous, occult GI tract disease.  The patient and his daughter voiced understanding of this, but want to think more about it before officially seeking a GI referral.  I will see him back in 3 months to see how well he responded to his upcoming IV iron.  The patient  understands all the plans discussed today and is in agreement with them.  I do appreciate Street, [name], * for his new consult.   Lavada Langsam Macarthur Critchley, MD

## 2020-11-04 ENCOUNTER — Other Ambulatory Visit: Payer: Self-pay | Admitting: Oncology

## 2020-11-04 ENCOUNTER — Encounter: Payer: Self-pay | Admitting: Oncology

## 2020-11-04 ENCOUNTER — Inpatient Hospital Stay: Payer: PPO

## 2020-11-04 ENCOUNTER — Other Ambulatory Visit: Payer: Self-pay

## 2020-11-04 ENCOUNTER — Inpatient Hospital Stay: Payer: PPO | Attending: Oncology | Admitting: Oncology

## 2020-11-04 DIAGNOSIS — D508 Other iron deficiency anemias: Secondary | ICD-10-CM | POA: Diagnosis not present

## 2020-11-04 DIAGNOSIS — Z79899 Other long term (current) drug therapy: Secondary | ICD-10-CM | POA: Insufficient documentation

## 2020-11-04 DIAGNOSIS — D509 Iron deficiency anemia, unspecified: Secondary | ICD-10-CM | POA: Insufficient documentation

## 2020-11-04 DIAGNOSIS — D649 Anemia, unspecified: Secondary | ICD-10-CM | POA: Diagnosis not present

## 2020-11-04 LAB — CBC AND DIFFERENTIAL
HCT: 32 — AB (ref 41–53)
Hemoglobin: 9.4 — AB (ref 13.5–17.5)
Neutrophils Absolute: 5.28
Platelets: 410 — AB (ref 150–399)
WBC: 9.6

## 2020-11-04 LAB — HEPATIC FUNCTION PANEL
ALT: 16 (ref 10–40)
AST: 24 (ref 14–40)
Alkaline Phosphatase: 121 (ref 25–125)
Bilirubin, Total: 0.4

## 2020-11-04 LAB — BASIC METABOLIC PANEL
BUN: 24 — AB (ref 4–21)
CO2: 23 — AB (ref 13–22)
Chloride: 110 — AB (ref 99–108)
Creatinine: 3 — AB (ref 0.6–1.3)
Glucose: 114
Potassium: 4.3 (ref 3.4–5.3)
Sodium: 143 (ref 137–147)

## 2020-11-04 LAB — FOLATE: Folate: 44.1 ng/mL (ref >5.9)

## 2020-11-04 LAB — COMPREHENSIVE METABOLIC PANEL
Albumin: 3.7 (ref 3.5–5.0)
Calcium: 8.5 — AB (ref 8.7–10.7)

## 2020-11-04 LAB — IRON AND TIBC
Iron: 16 ug/dL — ABNORMAL LOW (ref 45–182)
Saturation Ratios: 4 % — ABNORMAL LOW (ref 17.9–39.5)
TIBC: 422 ug/dL (ref 250–450)
UIBC: 406 ug/dL

## 2020-11-04 LAB — VITAMIN B12: Vitamin B-12: 307 pg/mL (ref 180–914)

## 2020-11-04 LAB — CBC: RBC: 4.73 (ref 3.87–5.11)

## 2020-11-04 LAB — FERRITIN: Ferritin: 5 ng/mL — ABNORMAL LOW (ref 24–336)

## 2020-11-05 ENCOUNTER — Encounter: Payer: Self-pay | Admitting: Oncology

## 2020-11-05 MED FILL — Ferumoxytol Inj 510 MG/17ML (30 MG/ML) (Elemental Fe): INTRAVENOUS | Qty: 17 | Status: AC

## 2020-11-08 ENCOUNTER — Other Ambulatory Visit: Payer: Self-pay

## 2020-11-08 ENCOUNTER — Inpatient Hospital Stay: Payer: PPO

## 2020-11-08 VITALS — BP 130/80 | HR 112 | Temp 98.6°F | Resp 18 | Ht 69.0 in | Wt 200.7 lb

## 2020-11-08 DIAGNOSIS — D509 Iron deficiency anemia, unspecified: Secondary | ICD-10-CM | POA: Diagnosis not present

## 2020-11-08 MED ORDER — SODIUM CHLORIDE 0.9 % IV SOLN
510.0000 mg | Freq: Once | INTRAVENOUS | Status: AC
  Administered 2020-11-08: 510 mg via INTRAVENOUS
  Filled 2020-11-08: qty 17

## 2020-11-08 MED ORDER — SODIUM CHLORIDE 0.9 % IV SOLN: Freq: Once | INTRAVENOUS | Status: AC

## 2020-11-08 NOTE — Patient Instructions (Signed)
[facility]  Discharge Instructions: Thank you for choosing [facility] to provide your oncology and hematology care.  If you have a lab appointment with the [facility], please go directly to the [facility] and check in at the registration area.   Wear comfortable clothing and clothing appropriate for easy access to any Portacath or PICC line.   We strive to give you quality time with your provider. You may need to reschedule your appointment if you arrive late (15 or more minutes).  Arriving late affects you and other patients whose appointments are after yours.  Also, if you miss three or more appointments without notifying the office, you may be dismissed from the clinic at the provider's discretion.      For prescription refill requests, have your pharmacy contact our office and allow 72 hours for refills to be completed.    Today you received the following chemotherapy and/or immunotherapy agents    To help prevent nausea and vomiting after your treatment, we encourage you to take your nausea medication as directed.  BELOW ARE SYMPTOMS THAT SHOULD BE REPORTED IMMEDIATELY: *FEVER GREATER THAN 100.4 F (38 C) OR HIGHER *CHILLS OR SWEATING *NAUSEA AND VOMITING THAT IS NOT CONTROLLED WITH YOUR NAUSEA MEDICATION *UNUSUAL SHORTNESS OF BREATH *UNUSUAL BRUISING OR BLEEDING *URINARY PROBLEMS (pain or burning when urinating, or frequent urination) *BOWEL PROBLEMS (unusual diarrhea, constipation, pain near the anus) TENDERNESS IN MOUTH AND THROAT WITH OR WITHOUT PRESENCE OF ULCERS (sore throat, sores in mouth, or a toothache) UNUSUAL RASH, SWELLING OR PAIN  UNUSUAL VAGINAL DISCHARGE OR ITCHING   Items with * indicate a potential emergency and should be followed up as soon as possible or go to the Emergency Department if any problems should occur.  Please show the CHEMOTHERAPY ALERT CARD or IMMUNOTHERAPY ALERT CARD at check-in to the Emergency  Department and triage nurse.  Should you have questions after your visit or need to cancel or reschedule your appointment, please contact [facility]  Dept: [phone]  and follow the prompts.  Office hours are 8:00 a.m. to 4:30 p.m. Monday - Friday. Please note that voicemails left after 4:00 p.m. may not be returned until the following business day.  We are closed weekends and major holidays. You have access to a nurse at all times for urgent questions. Please call the main number to the clinic Dept: [phone] and follow the prompts.  For any non-urgent questions, you may also contact your provider using MyChart. We now offer e-Visits for anyone 60 and older to request care online for non-urgent symptoms. For details visit [email or web address].   Also download the MyChart app! Go to the app store, search "MyChart", open the app, select [facility], and log in with your MyChart username and password.  Due to Covid, a mask is required upon entering the hospital/clinic. If you do not have a mask, one will be given to you upon arrival. For doctor visits, patients may have 1 support person aged 71 or older with them. For treatment visits, patients cannot have anyone with them due to current Covid guidelines and our immunocompromised population.   Ferumoxytol Injection What is this medication? FERUMOXYTOL (FER ue MOX i tol) treats low levels of iron in your body (iron deficiency anemia). Iron is a mineral that plays an important role in making red blood cells, which carry oxygen from your lungs to the rest of your body. This medicine may be used  for other purposes; ask your health care provider or pharmacist if you have questions. COMMON BRAND NAME(S): Feraheme What should I tell my care team before I take this medication? They need to know if you have any of these conditions: Anemia not caused by low iron levels High levels of iron in the blood Magnetic resonance  imaging (MRI) test scheduled An unusual or allergic reaction to iron, other medications, foods, dyes, or preservatives Pregnant or trying to get pregnant Breast-feeding How should I use this medication? This medication is for injection into a vein. It is given in a hospital or clinic setting. Talk to your care team the use of this medication in children. Special care may be needed. Overdosage: If you think you have taken too much of this medicine contact a poison control center or emergency room at once. NOTE: This medicine is only for you. Do not share this medicine with others. What if I miss a dose? It is important not to miss your dose. Call your care team if you are unable to keep an appointment. What may interact with this medication? Other iron products This list may not describe all possible interactions. Give your health care provider a list of all the medicines, herbs, non-prescription drugs, or dietary supplements you use. Also tell them if you smoke, drink alcohol, or use illegal drugs. Some items may interact with your medicine. What should I watch for while using this medication? Visit your care team regularly. Tell your care team if your symptoms do not start to get better or if they get worse. You may need blood work done while you are taking this medication. You may need to follow a special diet. Talk to your care team. Foods that contain iron include: whole grains/cereals, dried fruits, beans, or peas, leafy green vegetables, and organ meats (liver, kidney). What side effects may I notice from receiving this medication? Side effects that you should report to your care team as soon as possible: Allergic reactions-skin rash, itching, hives, swelling of the face, lips, tongue, or throat Low blood pressure-dizziness, feeling faint or lightheaded, blurry vision Shortness of breath Side effects that usually do not require medical attention (report to your care team if they continue  or are bothersome): Flushing Headache Joint pain Muscle pain Nausea Pain, redness, or irritation at injection site This list may not describe all possible side effects. Call your doctor for medical advice about side effects. You may report side effects to FDA at 1-800-FDA-1088. Where should I keep my medication? This medication is given in a hospital or clinic and will not be stored at home. NOTE: This sheet is a summary. It may not cover all possible information. If you have questions about this medicine, talk to your doctor, pharmacist, or health care provider.  2022 Elsevier/Gold Standard (2020-05-14 15:35:12)

## 2020-11-11 MED FILL — Ferumoxytol Inj 510 MG/17ML (30 MG/ML) (Elemental Fe): INTRAVENOUS | Qty: 17 | Status: AC

## 2020-11-12 ENCOUNTER — Inpatient Hospital Stay: Payer: PPO | Attending: Oncology

## 2020-11-12 ENCOUNTER — Other Ambulatory Visit: Payer: Self-pay

## 2020-11-12 VITALS — BP 121/86 | HR 97 | Temp 97.7°F | Resp 18 | Ht 69.0 in | Wt 204.0 lb

## 2020-11-12 DIAGNOSIS — D509 Iron deficiency anemia, unspecified: Secondary | ICD-10-CM | POA: Diagnosis not present

## 2020-11-12 DIAGNOSIS — Z79899 Other long term (current) drug therapy: Secondary | ICD-10-CM | POA: Insufficient documentation

## 2020-11-12 MED ORDER — SODIUM CHLORIDE 0.9 % IV SOLN: Freq: Once | INTRAVENOUS | Status: AC

## 2020-11-12 MED ORDER — SODIUM CHLORIDE 0.9 % IV SOLN
510.0000 mg | Freq: Once | INTRAVENOUS | Status: AC
  Administered 2020-11-12: 510 mg via INTRAVENOUS
  Filled 2020-11-12: qty 17

## 2020-11-12 NOTE — Progress Notes (Signed)
Discharged home, daughter with patient-

## 2020-11-12 NOTE — Patient Instructions (Signed)

## 2020-11-24 DIAGNOSIS — G720 Drug-induced myopathy: Secondary | ICD-10-CM | POA: Diagnosis not present

## 2020-11-24 DIAGNOSIS — Z79899 Other long term (current) drug therapy: Secondary | ICD-10-CM | POA: Diagnosis not present

## 2020-11-24 DIAGNOSIS — N1832 Chronic kidney disease, stage 3b: Secondary | ICD-10-CM | POA: Diagnosis not present

## 2020-11-24 DIAGNOSIS — I5022 Chronic systolic (congestive) heart failure: Secondary | ICD-10-CM | POA: Diagnosis not present

## 2020-11-24 DIAGNOSIS — D508 Other iron deficiency anemias: Secondary | ICD-10-CM | POA: Diagnosis not present

## 2020-11-24 DIAGNOSIS — I25119 Atherosclerotic heart disease of native coronary artery with unspecified angina pectoris: Secondary | ICD-10-CM | POA: Diagnosis not present

## 2020-11-24 DIAGNOSIS — I10 Essential (primary) hypertension: Secondary | ICD-10-CM | POA: Diagnosis not present

## 2020-11-24 DIAGNOSIS — D6869 Other thrombophilia: Secondary | ICD-10-CM | POA: Diagnosis not present

## 2020-11-24 DIAGNOSIS — I484 Atypical atrial flutter: Secondary | ICD-10-CM | POA: Diagnosis not present

## 2020-11-24 DIAGNOSIS — E785 Hyperlipidemia, unspecified: Secondary | ICD-10-CM | POA: Diagnosis not present

## 2020-11-24 DIAGNOSIS — I480 Paroxysmal atrial fibrillation: Secondary | ICD-10-CM | POA: Diagnosis not present

## 2020-11-24 DIAGNOSIS — Z0000 Encounter for general adult medical examination without abnormal findings: Secondary | ICD-10-CM | POA: Diagnosis not present

## 2020-11-25 ENCOUNTER — Ambulatory Visit: Payer: PPO | Admitting: Cardiology

## 2020-11-25 ENCOUNTER — Encounter: Payer: Self-pay | Admitting: Cardiology

## 2020-11-25 ENCOUNTER — Other Ambulatory Visit: Payer: Self-pay

## 2020-11-25 VITALS — BP 138/86 | HR 64 | Ht 69.0 in | Wt 208.0 lb

## 2020-11-25 DIAGNOSIS — I130 Hypertensive heart and chronic kidney disease with heart failure and stage 1 through stage 4 chronic kidney disease, or unspecified chronic kidney disease: Secondary | ICD-10-CM

## 2020-11-25 DIAGNOSIS — N1832 Chronic kidney disease, stage 3b: Secondary | ICD-10-CM

## 2020-11-25 DIAGNOSIS — D649 Anemia, unspecified: Secondary | ICD-10-CM | POA: Diagnosis not present

## 2020-11-25 DIAGNOSIS — I5042 Chronic combined systolic (congestive) and diastolic (congestive) heart failure: Secondary | ICD-10-CM | POA: Diagnosis not present

## 2020-11-25 DIAGNOSIS — I484 Atypical atrial flutter: Secondary | ICD-10-CM

## 2020-11-25 DIAGNOSIS — Z7901 Long term (current) use of anticoagulants: Secondary | ICD-10-CM

## 2020-11-25 NOTE — Progress Notes (Signed)
Cardiology Office Note:    Date:  11/25/2020   ID:  ZHION PEVEHOUSE, DOB 1933/09/27, MRN [id number]  PCP:  Street, [name], MD  Cardiologist:  Shirlee More, MD    Referring MD: [address], [name], *    ASSESSMENT:    1. Atypical atrial flutter ([place])   2. Chronic anticoagulation   3. 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 Anemia, unspecified type    PLAN:    In order of problems listed above:  Remains in atrial flutter with rapid uncontrolled rate beta-blockers are a poor choice with his underlying lung disease previous bronchospasm I would give him digoxin with his severe CKD we will continue his calcium channel blocker recheck an EKG in the office and continue his anticoagulant. His daughter will check to see if kidney function was looked at yesterday if not we will draw a BMP today Anemia is improved he has received 2 doses of IV iron If stable after follow-up with nephrology agrees to consider cardioversion the longer we delay the less likely there is little reason sinus rhythm.   Next appointment: First week of December   Medication Adjustments/Labs and Tests Ordered: Current medicines are reviewed at length with the patient today.  Concerns regarding medicines are outlined above.  No orders of the defined types were placed in this encounter.  No orders of the defined types were placed in this encounter.   Chief Complaint  Patient presents with   Follow-up   Atrial Flutter   Congestive Heart Failure   History of Present Illness:    Nicholas [name] is a 87 y.o. male with a hx of hypertension with chronic kidney disease stage IIIb heart failure with mildly reduced ejection fraction 40 to 45% atrial flutter last seen 10/27/2020.  Compliance with diet, lifestyle and medications: Yes  He has received RVR 120s hemoglobin yesterday 10.9 she is  unsure whether or not he has had kidney function rechecked.  He is off the diuretic his creatinine 3 weeks ago was 3.0 and his diuretic was discontinued. He is a little better but still short of breath with activities and edematous and quite weak no chest pain syncope or bleeding.  Remains on his anticoagulant.  He is scheduled to be seen by nephrology end of the month.  In preparation for cardioversion he is found to be severely anemic hemoglobin 8.8 and very microcytic MCV 69 he has iron deficiency anemia cardioversion was canceled and referred to hematology for treatment of iron deficiency anemia  Echo 10/25/2020:  1. Rapid atrial fibrillation/ flutter. Left ventricular ejection  fraction, by estimation, is 40 to 45%. The left ventricle has mildly  decreased function. The left ventricle has no regional wall motion  abnormalities. There is moderate concentric left  ventricular hypertrophy. Left ventricular diastolic parameters are  indeterminate.   2. Right ventricular systolic function is normal. The right ventricular  size is normal. There is moderately elevated pulmonary artery systolic  pressure.   3. Left atrial size was moderately dilated.   4. The mitral valve is normal in structure. Mild mitral valve  regurgitation. No evidence of mitral stenosis.   5. The aortic valve is tricuspid. There is mild calcification of the  aortic valve. There is mild thickening of the aortic valve. Aortic valve  regurgitation is mild. Mild aortic valve stenosis.   6. There is moderate dilatation of the  ascending aorta, measuring 39 mm.   7. The inferior vena cava is dilated in size with <50% respiratory  variability, suggesting right atrial pressure of 15 mmHg.    Component Ref Range & Units 12 d ago   NT-Pro BNP 0 - 486 pg/mL 5,768 High     Past Medical History:  Diagnosis Date   Anemia    none recent   Arthritis    Asthma    Chronic kidney disease    ckd stage 3   Family history of adverse  reaction to anesthesia    daughter has ponv    Glaucoma    Hypertension     Past Surgical History:  Procedure Laterality Date   COLONOSCOPY WITH PROPOFOL N/A 02/03/2015   Procedure: COLONOSCOPY WITH PROPOFOL;  Surgeon: Arta Silence, MD;  Location: [facility];  Service: Endoscopy;  Laterality: N/A;   KNEE ARTHROSCOPY Left    ROTATOR CUFF REPAIR Right    SHOULDER ARTHROSCOPY Left     Current Medications: Current Meds  Medication Sig   acetaminophen (TYLENOL) 500 MG tablet Take 500-1,000 mg by mouth every 8 (eight) hours as needed (for pain or headaches).    albuterol (PROVENTIL HFA;VENTOLIN HFA) 108 (90 Base) MCG/ACT inhaler Inhale 2 puffs into the lungs every 4 (four) hours as needed for wheezing or shortness of breath.   allopurinol (ZYLOPRIM) 100 MG tablet Take 100 mg by mouth daily.   amLODipine (NORVASC) 5 MG tablet Take 5 mg by mouth daily.   apixaban (ELIQUIS) 2.5 MG TABS tablet Take 1 tablet (2.5 mg total) by mouth 2 (two) times daily.   Calcium Carb-Cholecalciferol (CALCIUM 600+D3 PO) Take 2 tablets by mouth daily.   diltiazem (CARDIZEM) 30 MG tablet Take 1 tablet (30 mg total) by mouth 3 (three) times daily.   Fluticasone-Salmeterol (ADVAIR) 250-50 MCG/DOSE AEPB Inhale 1 puff into the lungs 2 (two) times daily.   latanoprost (XALATAN) 0.005 % ophthalmic solution Place 1 drop into both eyes at bedtime.   Multiple Vitamin (MULTIVITAMIN WITH MINERALS) TABS tablet Take 1 tablet by mouth every morning.   Omega-3 Fatty Acids (FISH OIL) 1000 MG CAPS Take 1,000 mg by mouth daily.   pantoprazole (PROTONIX) 20 MG tablet Take 1 tablet (20 mg total) by mouth daily.     Allergies:   Penicillins   Social History   Socioeconomic History   Marital status: Widowed    Spouse name: Not on file   Number of children: 5   Years of education: 8   Highest education level: Not on file  Occupational History   Occupation: Retired   Tobacco Use   Smoking status: Former   Smokeless  tobacco: Never   Tobacco comments:    quit 35 yrs ago  Substance and Sexual Activity   Alcohol use: No   Drug use: No   Sexual activity: Not on file  Other Topics Concern   Not on file  Social History Narrative   11/28/18 Lives with grandson, [name].    Caffeine use: daily   Right handed    Social Determinants of Health   Financial Resource Strain: Not on file  Food Insecurity: Not on file  Transportation Needs: Not on file  Physical Activity: Not on file  Stress: Not on file  Social Connections: Not on file     Family History: The patient's family history includes Hypertension in his father and mother. ROS:   Please see the history of present illness.    All other systems  reviewed and are negative.  EKGs/Labs/Other Studies Reviewed:    The following studies were reviewed today:  EKG:  EKG ordered today and personally reviewed.  The ekg ordered today demonstrates confirms atrial flutter with rate 130 bpm  Recent Labs: 10/27/2020: NT-Pro BNP 6,081; TSH 2.820 11/04/2020: ALT 16; BUN 24; Creatinine 3.0; Hemoglobin 9.4; Platelets 410; Potassium 4.3; Sodium 143  Recent Lipid Panel    Component Value Date/Time   CHOL 111 07/20/2018 0623   CHOL 149 12/10/2017 1157   TRIG 61 07/20/2018 0623   HDL 37 (L) 07/20/2018 0623   HDL 40 12/10/2017 1157   CHOLHDL 3.0 07/20/2018 0623   VLDL 12 07/20/2018 0623   LDLCALC 62 07/20/2018 0623   LDLCALC 73 12/10/2017 1157    Physical Exam:    VS:  BP 138/86   Pulse 64   Ht 5\' 9"  (1.753 m)   Wt 208 lb (94.3 kg)   SpO2 96%   BMI 30.72 kg/m     Wt Readings from Last 3 Encounters:  11/25/20 208 lb (94.3 kg)  11/12/20 204 lb (92.5 kg)  11/08/20 200 lb 11.5 oz (91 kg)     GEN: He looks chronically ill and debilitated although he is not short of breath at rest well nourished, well developed in no acute distress HEENT: Normal NECK: No JVD; No carotid bruits LYMPHATICS: No lymphadenopathy CARDIAC: Very rapid and irregular, his  pulses 130 bpm  RESPIRATORY:  Clear to auscultation without rales, wheezing or rhonchi  ABDOMEN: Soft, non-tender, non-distended MUSCULOSKELETAL: 2+ boggy around the knee edema may be due to his calcium channel blocker edema; No deformity  SKIN: Warm and dry NEUROLOGIC:  Alert and oriented x 3 PSYCHIATRIC:  Normal affect    Signed, Shirlee More, MD  11/25/2020 12:54 PM    [facility]

## 2020-11-25 NOTE — Patient Instructions (Signed)
Medication Instructions:  Your physician recommends that you continue on your current medications as directed. Please refer to the Current Medication list given to you today.  *If you need a refill on your cardiac medications before your next appointment, please call your pharmacy*   Lab Work: None If you have labs (blood work) drawn today and your tests are completely normal, you will receive your results only by: [facility] (if you have MyChart) OR A paper copy in the mail If you have any lab test that is abnormal or we need to change your treatment, we will call you to review the results.   Testing/Procedures: None   Follow-Up: At [facility], you and your health needs are our priority.  As part of our continuing mission to provide you with exceptional heart care, we have created designated Provider Care Teams.  These Care Teams include your primary Cardiologist (physician) and Advanced Practice Providers (APPs -  Physician Assistants and Nurse Practitioners) who all work together to provide you with the care you need, when you need it.  We recommend signing up for the patient portal called "MyChart".  Sign up information is provided on this After Visit Summary.  MyChart is used to connect with patients for Virtual Visits (Telemedicine).  Patients are able to view lab/test results, encounter notes, upcoming appointments, etc.  Non-urgent messages can be sent to your provider as well.   To learn more about what you can do with MyChart, go to [email or web address].    Your next appointment:   Already scheduled  The format for your next appointment:   In Person  Provider:   Shirlee More, MD\    Other Instructions

## 2020-12-08 DIAGNOSIS — G459 Transient cerebral ischemic attack, unspecified: Secondary | ICD-10-CM | POA: Diagnosis not present

## 2020-12-08 DIAGNOSIS — E785 Hyperlipidemia, unspecified: Secondary | ICD-10-CM | POA: Diagnosis not present

## 2020-12-08 DIAGNOSIS — N183 Chronic kidney disease, stage 3 unspecified: Secondary | ICD-10-CM | POA: Diagnosis not present

## 2020-12-08 DIAGNOSIS — I5030 Unspecified diastolic (congestive) heart failure: Secondary | ICD-10-CM | POA: Diagnosis not present

## 2020-12-08 DIAGNOSIS — Z8709 Personal history of other diseases of the respiratory system: Secondary | ICD-10-CM | POA: Diagnosis not present

## 2020-12-08 DIAGNOSIS — N2581 Secondary hyperparathyroidism of renal origin: Secondary | ICD-10-CM | POA: Diagnosis not present

## 2020-12-08 DIAGNOSIS — N179 Acute kidney failure, unspecified: Secondary | ICD-10-CM | POA: Diagnosis not present

## 2020-12-08 DIAGNOSIS — D631 Anemia in chronic kidney disease: Secondary | ICD-10-CM | POA: Diagnosis not present

## 2020-12-15 ENCOUNTER — Ambulatory Visit: Payer: PPO | Admitting: Cardiology

## 2020-12-15 ENCOUNTER — Other Ambulatory Visit: Payer: Self-pay

## 2020-12-15 ENCOUNTER — Encounter: Payer: Self-pay | Admitting: Cardiology

## 2020-12-15 VITALS — BP 120/80 | HR 110 | Ht 69.0 in | Wt 210.0 lb

## 2020-12-15 DIAGNOSIS — D649 Anemia, unspecified: Secondary | ICD-10-CM | POA: Diagnosis not present

## 2020-12-15 DIAGNOSIS — I130 Hypertensive heart and chronic kidney disease with heart failure and stage 1 through stage 4 chronic kidney disease, or unspecified chronic kidney disease: Secondary | ICD-10-CM | POA: Diagnosis not present

## 2020-12-15 DIAGNOSIS — I4510 Unspecified right bundle-branch block: Secondary | ICD-10-CM | POA: Diagnosis not present

## 2020-12-15 DIAGNOSIS — Z7901 Long term (current) use of anticoagulants: Secondary | ICD-10-CM | POA: Diagnosis not present

## 2020-12-15 DIAGNOSIS — I5042 Chronic combined systolic (congestive) and diastolic (congestive) heart failure: Secondary | ICD-10-CM | POA: Diagnosis not present

## 2020-12-15 DIAGNOSIS — N1832 Chronic kidney disease, stage 3b: Secondary | ICD-10-CM

## 2020-12-15 DIAGNOSIS — I484 Atypical atrial flutter: Secondary | ICD-10-CM

## 2020-12-15 NOTE — Patient Instructions (Signed)
Medication Instructions:  Your physician recommends that you continue on your current medications as directed. Please refer to the Current Medication list given to you today.  Use over the counter Miralax for constipation.  *If you need a refill on your cardiac medications before your next appointment, please call your pharmacy*   Lab Work: Your physician recommends that you have a BMET, Pro BNP and CBC today in the office.  If you have labs (blood work) drawn today and your tests are completely normal, you will receive your results only by: [place] (if you have MyChart) OR A paper copy in the mail If you have any lab test that is abnormal or we need to change your treatment, we will call you to review the results.   Testing/Procedures: Your physician has recommended that you have a Cardioversion (DCCV). Electrical Cardioversion uses a jolt of electricity to your heart either through paddles or wired patches attached to your chest. This is a controlled, usually prescheduled, procedure. Defibrillation is done under light anesthesia in the hospital, and you usually go home the day of the procedure. This is done to get your heart back into a normal rhythm. You are not awake for the procedure.   Diagnosis: atrial flutter  DATE AND TIME OF PROCEDURE: 12/24/20 at 7:30  Please register at the Admitting Department at: 6:00.  DO NOT EAT OR DRINK ANYTHING after midnight prior to your procedure.  You should take your medications as usual with a sip of water.  If you are diabetic, DO NOT TAKE YOUR DIABETIC MEDICATIONS the morning OF THE PROCEDURE.  DO NOT STOP or miss any doses of your Eliquis. If you miss a dose, please let us know as soon as possible.  You will need someone with you to drive you home after the procedure.    Follow-Up: At [facility], you and your health needs are our priority.  As part of our continuing mission to provide you with exceptional heart care, we have  created designated Provider Care Teams.  These Care Teams include your primary Cardiologist (physician) and Advanced Practice Providers (APPs -  Physician Assistants and Nurse Practitioners) who all work together to provide you with the care you need, when you need it.  We recommend signing up for the patient portal called "MyChart".  Sign up information is provided on this After Visit Summary.  MyChart is used to connect with patients for Virtual Visits (Telemedicine).  Patients are able to view lab/test results, encounter notes, upcoming appointments, etc.  Non-urgent messages can be sent to your provider as well.   To learn more about what you can do with MyChart, go to [email or web address].    Your next appointment:   6 week(s)  The format for your next appointment:   In Person  Provider:   Shirlee More, MD   Other Instructions  Electrical Cardioversion Electrical cardioversion is the delivery of a jolt of electricity to restore a normal rhythm to the heart. A rhythm that is too fast or is not regular keeps the heart from pumping well. In this procedure, sticky patches or metal paddles are placed on the chest to deliver electricity to the heart from a device. This procedure may be done in an emergency if: There is low or no blood pressure as a result of the heart rhythm. Normal rhythm must be restored as fast as possible to protect the brain and heart from further damage. It may save a life. This may also be  a scheduled procedure for irregular or fast heart rhythms that are not immediately life-threatening. Tell a health care provider about: Any allergies you have. All medicines you are taking, including vitamins, herbs, eye drops, creams, and over-the-counter medicines. Any problems you or family members have had with anesthetic medicines. Any blood disorders you have. Any surgeries you have had. Any medical conditions you have. Whether you are pregnant or may be  pregnant. What are the risks? Generally, this is a safe procedure. However, problems may occur, including: Allergic reactions to medicines. A blood clot that breaks free and travels to other parts of your body. The possible return of an abnormal heart rhythm within hours or days after the procedure. Your heart stopping (cardiac arrest). This is rare. What happens before the procedure? Medicines Your health care provider may have you start taking: Blood-thinning medicines (anticoagulants) so your blood does not clot as easily. Medicines to help stabilize your heart rate and rhythm. Ask your health care provider about: Changing or stopping your regular medicines. This is especially important if you are taking diabetes medicines or blood thinners. Taking medicines such as aspirin and ibuprofen. These medicines can thin your blood. Do not take these medicines unless your health care provider tells you to take them. Taking over-the-counter medicines, vitamins, herbs, and supplements. General instructions Follow instructions from your health care provider about eating or drinking restrictions. Plan to have someone take you home from the hospital or clinic. If you will be going home right after the procedure, plan to have someone with you for 24 hours. Ask your health care provider what steps will be taken to help prevent infection. These may include washing your skin with a germ-killing soap. What happens during the procedure? An IV will be inserted into one of your veins. Sticky patches (electrodes) or metal paddles may be placed on your chest. You will be given a medicine to help you relax (sedative). An electrical shock will be delivered. The procedure may vary among health care providers and hospitals.    What can I expect after the procedure? Your blood pressure, heart rate, breathing rate, and blood oxygen level will be monitored until you leave the hospital or clinic. Your heart rhythm  will be watched to make sure it does not change. You may have some redness on the skin where the shocks were given. Follow these instructions at home: Do not drive for 24 hours if you were given a sedative during your procedure. Take over-the-counter and prescription medicines only as told by your health care provider. Ask your health care provider how to check your pulse. Check it often. Rest for 48 hours after the procedure or as told by your health care provider. Avoid or limit your caffeine use as told by your health care provider. Keep all follow-up visits as told by your health care provider. This is important. Contact a health care provider if: You feel like your heart is beating too quickly or your pulse is not regular. You have a serious muscle cramp that does not go away. Get help right away if: You have discomfort in your chest. You are dizzy or you feel faint. You have trouble breathing or you are short of breath. Your speech is slurred. You have trouble moving an arm or leg on one side of your body. Your fingers or toes turn cold or blue. Summary Electrical cardioversion is the delivery of a jolt of electricity to restore a normal rhythm to the heart. This procedure  may be done right away in an emergency or may be a scheduled procedure if the condition is not an emergency. Generally, this is a safe procedure. After the procedure, check your pulse often as told by your health care provider. This information is not intended to replace advice given to you by your health care provider. Make sure you discuss any questions you have with your health care provider. Document Revised: 07/29/2018 Document Reviewed: 07/29/2018 Elsevier Patient Education  [place].

## 2020-12-15 NOTE — Addendum Note (Signed)
Addended by: Truddie Hidden on: 12/15/2020 11:24 AM   Modules accepted: Orders

## 2020-12-15 NOTE — H&P (View-Only) (Signed)
Cardiology Office Note:    Date:  12/15/2020   ID:  Nicholas [name], DOB [birth year], MRN [id number]  PCP:  Street, [name], MD  Cardiologist:  Shirlee More, MD    Referring MD: [address] [name], *    ASSESSMENT:    1. Atypical atrial flutter ([place])   2. Chronic anticoagulation   3. 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 Anemia, unspecified type   5. RBBB    PLAN:    In order of problems listed above:  Remains in atrial flutter is iron deficiency anemia has been addressed his CKD has been evaluated by Dr. Justin Mend presently off diuretic and to try to improve his functional status outpatient cardioversion for his atypical atrial flutter.  He has been compliant with his diuretic and is clinically stable.  He is initially seen for me with Atrial flutter 10/07/2020. Continues anticoagulation Stable at this point time he is not on a loop diuretic because of marked deterioration in kidney function.  He is on calcium channel blocker may be responsible for his edema however his recent N-terminal proBNP 1 month ago was severely elevated at 6081. He has received IV iron we will recheck a CBC as well as renal function prior to outpatient cardioversion family request [facility]   Next appointment: 6 weeks   Medication Adjustments/Labs and Tests Ordered: Current medicines are reviewed at length with the patient today.  Concerns regarding medicines are outlined above.  No orders of the defined types were placed in this encounter.  No orders of the defined types were placed in this encounter.   Chief Complaint  Patient presents with   Follow-up   Atrial Flutter     COVID-19 positive test (U07.1, COVID-19) with Acute Respiratory Distress Syndrome (ARDS) (J80, ARDS) (If respiratory failure or sepsis present, add as separate assessment)  He has been seen by  nephrology in [facility] Dr. Edrick Oh and presently is off diuretic    History of Present Illness:    Nicholas [name] is a 87 y.o. male with a hx of hypertension with chronic kidney disease stage IIIb heart failure with mildly reduced ejection fraction 40 to 45% atrial flutter  11/25/2020 last seen. He was seen in [name] ED 09/21/2020 referred by urgent care with concern he could require pacemaker.  His EKG initially showed atrial fibrillation with right bundle branch block and subsequent EKG showed a regular rhythm and rate of 127 bpm felt to be sinus tachycardia more consistent with atypical atrial flutter.  He was discharged from the emergency room and was not initiated on anticoagulation laboratory studies showed a potassium 4.7 sodium 142 creatinine 1.94 GFR 34 cc hemoglobin 10.5 microcytic indices platelets 350,000 his high-sensitivity troponin was normal chest x-ray showed mild cardiomegaly no acute changes. Echocardiogram from 04/09/2005 10/2018 showed normal left ventricular function EF 55 to 60% right ventricle is normal in size function and pulmonary artery pressure he had mild mitral annular calcification otherwise normal.  Compliance with diet, lifestyle and medications: yes  He was seen by Dr. Justin Mend nephrology and is presently off his diuretic with severe CKD His weights at home have been stable and he has his usual shortness of breath with more than minor activity He is not having edema orthopnea palpitation or syncope He has been maintained on his anticoagulant without interruption. His anemia has been treated with IV iron We  will recheck an EKG in the office and if remains in atrial flutter set him up for outpatient cardioversion family request [facility]. Initially we canceled cardioversion with his anemia until he received parenteral iron and held on cardioversion pending nephrology evaluation. Past Medical History:  Diagnosis Date   Anemia    none recent    Arthritis    Asthma    Chronic kidney disease    ckd stage 3   Family history of adverse reaction to anesthesia    daughter has ponv    Glaucoma    Hypertension     Past Surgical History:  Procedure Laterality Date   COLONOSCOPY WITH PROPOFOL N/A 02/03/2015   Procedure: COLONOSCOPY WITH PROPOFOL;  Surgeon: Arta Silence, MD;  Location: [facility];  Service: Endoscopy;  Laterality: N/A;   KNEE ARTHROSCOPY Left    ROTATOR CUFF REPAIR Right    SHOULDER ARTHROSCOPY Left     Current Medications: Current Meds  Medication Sig   acetaminophen (TYLENOL) 500 MG tablet Take 500-1,000 mg by mouth every 8 (eight) hours as needed (for pain or headaches).    albuterol (PROVENTIL HFA;VENTOLIN HFA) 108 (90 Base) MCG/ACT inhaler Inhale 2 puffs into the lungs every 4 (four) hours as needed for wheezing or shortness of breath.   allopurinol (ZYLOPRIM) 100 MG tablet Take 100 mg by mouth daily.   amLODipine (NORVASC) 5 MG tablet Take 5 mg by mouth daily.   apixaban (ELIQUIS) 2.5 MG TABS tablet Take 1 tablet (2.5 mg total) by mouth 2 (two) times daily.   Calcium Carb-Cholecalciferol (CALCIUM 600+D3 PO) Take 2 tablets by mouth daily.   diltiazem (CARDIZEM) 30 MG tablet Take 1 tablet (30 mg total) by mouth 3 (three) times daily.   Fluticasone-Salmeterol (ADVAIR) 250-50 MCG/DOSE AEPB Inhale 1 puff into the lungs 2 (two) times daily.   latanoprost (XALATAN) 0.005 % ophthalmic solution Place 1 drop into both eyes at bedtime.   Multiple Vitamin (MULTIVITAMIN WITH MINERALS) TABS tablet Take 1 tablet by mouth every morning.   Omega-3 Fatty Acids (FISH OIL) 1000 MG CAPS Take 1,000 mg by mouth daily.   pantoprazole (PROTONIX) 20 MG tablet Take 1 tablet (20 mg total) by mouth daily.     Allergies:   Penicillins   Social History   Socioeconomic History   Marital status: Widowed    Spouse name: Not on file   Number of children: 5   Years of education: 8   Highest education level: Not on file   Occupational History   Occupation: Retired   Tobacco Use   Smoking status: Former   Smokeless tobacco: Never   Tobacco comments:    quit 35 yrs ago  Substance and Sexual Activity   Alcohol use: No   Drug use: No   Sexual activity: Not on file  Other Topics Concern   Not on file  Social History Narrative   11/28/18 Lives with grandson, [name].    Caffeine use: daily   Right handed    Social Determinants of Health   Financial Resource Strain: Not on file  Food Insecurity: Not on file  Transportation Needs: Not on file  Physical Activity: Not on file  Stress: Not on file  Social Connections: Not on file     Family History: The patient's family history includes Hypertension in his father and mother. ROS:   Please see the history of present illness.    All other systems reviewed and are negative.  EKGs/Labs/Other Studies Reviewed:  The following studies were reviewed today:  EKG:  EKG ordered today and personally reviewed.  The ekg ordered today demonstrates continued atypical atrial flutter with variable AV block right bundle branch block morphology rapid rate 127 bpm occasional PVCs  Recent Labs: 10/27/2020: NT-Pro BNP 6,081; TSH 2.820 11/04/2020: ALT 16; BUN 24; Creatinine 3.0; Hemoglobin 9.4; Platelets 410; Potassium 4.3; Sodium 143  Recent Lipid Panel    Component Value Date/Time   CHOL 111 07/20/2018 0623   CHOL 149 12/10/2017 1157   TRIG 61 07/20/2018 0623   HDL 37 (L) 07/20/2018 0623   HDL 40 12/10/2017 1157   CHOLHDL 3.0 07/20/2018 0623   VLDL 12 07/20/2018 0623   LDLCALC 62 07/20/2018 0623   LDLCALC 73 12/10/2017 1157    Physical Exam:    VS:  BP 120/80 (BP Location: Right Arm, Patient Position: Sitting, Cuff Size: Normal)    Pulse (!) 110    Ht 5\' 9"  (1.753 m)    Wt 210 lb (95.3 kg)    SpO2 94%    BMI 31.01 kg/m     Wt Readings from Last 3 Encounters:  12/15/20 210 lb (95.3 kg)  11/25/20 208 lb (94.3 kg)  11/12/20 204 lb (92.5 kg)      GEN: He looks improved from his previous visits looks his age somewhat chronically ill well nourished, well developed in no acute distress HEENT: Normal NECK: No JVD; No carotid bruits LYMPHATICS: No lymphadenopathy CARDIAC: Rapid irregular heart rhythm RRR, no murmurs, rubs, gallops RESPIRATORY:  Clear to auscultation without rales, wheezing or rhonchi  ABDOMEN: Soft, non-tender, non-distended MUSCULOSKELETAL: +1 edema bilaterally he is on a calcium channel blocker edema; No deformity  SKIN: Warm and dry NEUROLOGIC:  Alert and oriented x 3 PSYCHIATRIC:  Normal affect    Signed, Shirlee More, MD  12/15/2020 10:38 AM    [place]

## 2020-12-15 NOTE — Addendum Note (Signed)
Addended by: Darrel Reach on: 12/15/2020 12:25 PM   Modules accepted: Orders

## 2020-12-15 NOTE — Progress Notes (Signed)
Cardiology Office Note:    Date:  12/15/2020   ID:  [name], DOB 06/25/1933, MRN [id number]  PCP:  Street, [name], MD  Cardiologist:  Shirlee More, MD    Referring MD: [address] [name], *    ASSESSMENT:    1. Atypical atrial flutter ([place])   2. Chronic anticoagulation   3. 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 Anemia, unspecified type   5. RBBB    PLAN:    In order of problems listed above:  Remains in atrial flutter is iron deficiency anemia has been addressed his CKD has been evaluated by Dr. Justin Mend presently off diuretic and to try to improve his functional status outpatient cardioversion for his atypical atrial flutter.  He has been compliant with his diuretic and is clinically stable.  He is initially seen for me with Atrial flutter 10/07/2020. Continues anticoagulation Stable at this point time he is not on a loop diuretic because of marked deterioration in kidney function.  He is on calcium channel blocker may be responsible for his edema however his recent N-terminal proBNP 1 month ago was severely elevated at 6081. He has received IV iron we will recheck a CBC as well as renal function prior to outpatient cardioversion family request [facility]   Next appointment: 6 weeks   Medication Adjustments/Labs and Tests Ordered: Current medicines are reviewed at length with the patient today.  Concerns regarding medicines are outlined above.  No orders of the defined types were placed in this encounter.  No orders of the defined types were placed in this encounter.   Chief Complaint  Patient presents with   Follow-up   Atrial Flutter     COVID-19 positive test (U07.1, COVID-19) with Acute Respiratory Distress Syndrome (ARDS) (J80, ARDS) (If respiratory failure or sepsis present, add as separate assessment)  He has been seen by  nephrology in [facility] Dr. Edrick Oh and presently is off diuretic    History of Present Illness:    Nicholas [name] is a 87 y.o. male with a hx of hypertension with chronic kidney disease stage IIIb heart failure with mildly reduced ejection fraction 40 to 45% atrial flutter  11/25/2020 last seen. He was seen in [name] ED 09/21/2020 referred by urgent care with concern he could require pacemaker.  His EKG initially showed atrial fibrillation with right bundle branch block and subsequent EKG showed a regular rhythm and rate of 127 bpm felt to be sinus tachycardia more consistent with atypical atrial flutter.  He was discharged from the emergency room and was not initiated on anticoagulation laboratory studies showed a potassium 4.7 sodium 142 creatinine 1.94 GFR 34 cc hemoglobin 10.5 microcytic indices platelets 350,000 his high-sensitivity troponin was normal chest x-ray showed mild cardiomegaly no acute changes. Echocardiogram from 04/09/2005 10/2018 showed normal left ventricular function EF 55 to 60% right ventricle is normal in size function and pulmonary artery pressure he had mild mitral annular calcification otherwise normal.  Compliance with diet, lifestyle and medications: yes  He was seen by Dr. Justin Mend nephrology and is presently off his diuretic with severe CKD His weights at home have been stable and he has his usual shortness of breath with more than minor activity He is not having edema orthopnea palpitation or syncope He has been maintained on his anticoagulant without interruption. His anemia has been treated with IV iron We  will recheck an EKG in the office and if remains in atrial flutter set him up for outpatient cardioversion family request [facility]. Initially we canceled cardioversion with his anemia until he received parenteral iron and held on cardioversion pending nephrology evaluation. Past Medical History:  Diagnosis Date   Anemia    none recent    Arthritis    Asthma    Chronic kidney disease    ckd stage 3   Family history of adverse reaction to anesthesia    daughter has ponv    Glaucoma    Hypertension     Past Surgical History:  Procedure Laterality Date   COLONOSCOPY WITH PROPOFOL N/A 02/03/2015   Procedure: COLONOSCOPY WITH PROPOFOL;  Surgeon: Arta Silence, MD;  Location: [facility];  Service: Endoscopy;  Laterality: N/A;   KNEE ARTHROSCOPY Left    ROTATOR CUFF REPAIR Right    SHOULDER ARTHROSCOPY Left     Current Medications: Current Meds  Medication Sig   acetaminophen (TYLENOL) 500 MG tablet Take 500-1,000 mg by mouth every 8 (eight) hours as needed (for pain or headaches).    albuterol (PROVENTIL HFA;VENTOLIN HFA) 108 (90 Base) MCG/ACT inhaler Inhale 2 puffs into the lungs every 4 (four) hours as needed for wheezing or shortness of breath.   allopurinol (ZYLOPRIM) 100 MG tablet Take 100 mg by mouth daily.   amLODipine (NORVASC) 5 MG tablet Take 5 mg by mouth daily.   apixaban (ELIQUIS) 2.5 MG TABS tablet Take 1 tablet (2.5 mg total) by mouth 2 (two) times daily.   Calcium Carb-Cholecalciferol (CALCIUM 600+D3 PO) Take 2 tablets by mouth daily.   diltiazem (CARDIZEM) 30 MG tablet Take 1 tablet (30 mg total) by mouth 3 (three) times daily.   Fluticasone-Salmeterol (ADVAIR) 250-50 MCG/DOSE AEPB Inhale 1 puff into the lungs 2 (two) times daily.   latanoprost (XALATAN) 0.005 % ophthalmic solution Place 1 drop into both eyes at bedtime.   Multiple Vitamin (MULTIVITAMIN WITH MINERALS) TABS tablet Take 1 tablet by mouth every morning.   Omega-3 Fatty Acids (FISH OIL) 1000 MG CAPS Take 1,000 mg by mouth daily.   pantoprazole (PROTONIX) 20 MG tablet Take 1 tablet (20 mg total) by mouth daily.     Allergies:   Penicillins   Social History   Socioeconomic History   Marital status: Widowed    Spouse name: Not on file   Number of children: 5   Years of education: 8   Highest education level: Not on file   Occupational History   Occupation: Retired   Tobacco Use   Smoking status: Former   Smokeless tobacco: Never   Tobacco comments:    quit 35 yrs ago  Substance and Sexual Activity   Alcohol use: No   Drug use: No   Sexual activity: Not on file  Other Topics Concern   Not on file  Social History Narrative   11/28/18 Lives with grandson, [name].    Caffeine use: daily   Right handed    Social Determinants of Health   Financial Resource Strain: Not on file  Food Insecurity: Not on file  Transportation Needs: Not on file  Physical Activity: Not on file  Stress: Not on file  Social Connections: Not on file     Family History: The patient's family history includes Hypertension in his father and mother. ROS:   Please see the history of present illness.    All other systems reviewed and are negative.  EKGs/Labs/Other Studies Reviewed:  The following studies were reviewed today:  EKG:  EKG ordered today and personally reviewed.  The ekg ordered today demonstrates continued atypical atrial flutter with variable AV block right bundle branch block morphology rapid rate 127 bpm occasional PVCs  Recent Labs: 10/27/2020: NT-Pro BNP 6,081; TSH 2.820 11/04/2020: ALT 16; BUN 24; Creatinine 3.0; Hemoglobin 9.4; Platelets 410; Potassium 4.3; Sodium 143  Recent Lipid Panel    Component Value Date/Time   CHOL 111 07/20/2018 0623   CHOL 149 12/10/2017 1157   TRIG 61 07/20/2018 0623   HDL 37 (L) 07/20/2018 0623   HDL 40 12/10/2017 1157   CHOLHDL 3.0 07/20/2018 0623   VLDL 12 07/20/2018 0623   LDLCALC 62 07/20/2018 0623   LDLCALC 73 12/10/2017 1157    Physical Exam:    VS:  BP 120/80 (BP Location: Right Arm, Patient Position: Sitting, Cuff Size: Normal)   Pulse (!) 110   Ht 5\' 9"  (1.753 m)   Wt 210 lb (95.3 kg)   SpO2 94%   BMI 31.01 kg/m     Wt Readings from Last 3 Encounters:  12/15/20 210 lb (95.3 kg)  11/25/20 208 lb (94.3 kg)  11/12/20 204 lb (92.5 kg)      GEN: He looks improved from his previous visits looks his age somewhat chronically ill well nourished, well developed in no acute distress HEENT: Normal NECK: No JVD; No carotid bruits LYMPHATICS: No lymphadenopathy CARDIAC: Rapid irregular heart rhythm RRR, no murmurs, rubs, gallops RESPIRATORY:  Clear to auscultation without rales, wheezing or rhonchi  ABDOMEN: Soft, non-tender, non-distended MUSCULOSKELETAL: +1 edema bilaterally he is on a calcium channel blocker edema; No deformity  SKIN: Warm and dry NEUROLOGIC:  Alert and oriented x 3 PSYCHIATRIC:  Normal affect    Signed, Shirlee More, MD  12/15/2020 10:38 AM    [place]

## 2020-12-16 ENCOUNTER — Telehealth: Payer: Self-pay

## 2020-12-16 LAB — CBC WITH DIFFERENTIAL/PLATELET
Basophils Absolute: 0.1 10*3/uL (ref 0.0–0.2)
Basos: 1 %
EOS (ABSOLUTE): 0.3 10*3/uL (ref 0.0–0.4)
Eos: 3 %
Hematocrit: 40.7 % (ref 37.5–51.0)
Hemoglobin: 11.9 g/dL — ABNORMAL LOW (ref 13.0–17.7)
Immature Grans (Abs): 0 10*3/uL (ref 0.0–0.1)
Immature Granulocytes: 1 %
Lymphocytes Absolute: 2.7 10*3/uL (ref 0.7–3.1)
Lymphs: 31 %
MCH: 21.9 pg — ABNORMAL LOW (ref 26.6–33.0)
MCHC: 29.2 g/dL — ABNORMAL LOW (ref 31.5–35.7)
MCV: 75 fL — ABNORMAL LOW (ref 79–97)
Monocytes Absolute: 0.7 10*3/uL (ref 0.1–0.9)
Monocytes: 9 %
Neutrophils Absolute: 4.8 10*3/uL (ref 1.4–7.0)
Neutrophils: 55 %
Platelets: 299 10*3/uL (ref 150–450)
RBC: 5.44 x10E6/uL (ref 4.14–5.80)
RDW: 21.1 % — ABNORMAL HIGH (ref 11.6–15.4)
WBC: 8.6 10*3/uL (ref 3.4–10.8)

## 2020-12-16 LAB — BASIC METABOLIC PANEL
BUN/Creatinine Ratio: 10 (ref 10–24)
BUN: 19 mg/dL (ref 8–27)
CO2: 22 mmol/L (ref 20–29)
Calcium: 9.3 mg/dL (ref 8.6–10.2)
Chloride: 109 mmol/L — ABNORMAL HIGH (ref 96–106)
Creatinine, Ser: 1.87 mg/dL — ABNORMAL HIGH (ref 0.76–1.27)
Glucose: 76 mg/dL (ref 70–99)
Potassium: 4.6 mmol/L (ref 3.5–5.2)
Sodium: 146 mmol/L — ABNORMAL HIGH (ref 134–144)
eGFR: 35 mL/min/{1.73_m2} — ABNORMAL LOW (ref >59)

## 2020-12-16 LAB — PRO B NATRIURETIC PEPTIDE: NT-Pro BNP: 18617 pg/mL — ABNORMAL HIGH (ref 0–486)

## 2020-12-16 NOTE — Telephone Encounter (Signed)
Left message on patients voicemail to please return our call.   

## 2020-12-16 NOTE — Telephone Encounter (Signed)
-----   Message from Richardo Priest, MD sent at 12/16/2020  7:53 AM EST ----- Kidney function and hemoglobin are much better.  Okay for cardioversion

## 2020-12-16 NOTE — Telephone Encounter (Signed)
Spoke with patient regarding results and recommendation.  Patient verbalizes understanding and is agreeable to plan of care. Advised patient to call back with any issues or concerns.  

## 2020-12-21 ENCOUNTER — Encounter (HOSPITAL_COMMUNITY): Payer: Self-pay | Admitting: Cardiology

## 2020-12-21 NOTE — Progress Notes (Signed)
Attempted to obtain medical history via telephone, unable to reach at this time. I left a voicemail to return pre surgical testing department's phone call.  

## 2020-12-24 ENCOUNTER — Ambulatory Visit (HOSPITAL_COMMUNITY): Payer: PPO | Admitting: Certified Registered Nurse Anesthetist

## 2020-12-24 ENCOUNTER — Encounter (HOSPITAL_COMMUNITY): Payer: Self-pay | Admitting: Cardiology

## 2020-12-24 ENCOUNTER — Other Ambulatory Visit: Payer: Self-pay

## 2020-12-24 ENCOUNTER — Inpatient Hospital Stay (HOSPITAL_COMMUNITY)
Admission: RE | Admit: 2020-12-24 | Discharge: 2020-12-30 | DRG: 291 | Disposition: A | Discharge status: Home / Self Care | Payer: PPO | Attending: Internal Medicine | Admitting: Internal Medicine

## 2020-12-24 ENCOUNTER — Encounter (HOSPITAL_COMMUNITY)
Admission: RE | Disposition: A | Discharge status: Home / Self Care | Payer: Self-pay | Source: Home / Self Care | Attending: Internal Medicine

## 2020-12-24 ENCOUNTER — Observation Stay (HOSPITAL_COMMUNITY): Payer: PPO

## 2020-12-24 DIAGNOSIS — I509 Heart failure, unspecified: Secondary | ICD-10-CM | POA: Diagnosis not present

## 2020-12-24 DIAGNOSIS — N1832 Chronic kidney disease, stage 3b: Secondary | ICD-10-CM | POA: Diagnosis present

## 2020-12-24 DIAGNOSIS — M79673 Pain in unspecified foot: Secondary | ICD-10-CM

## 2020-12-24 DIAGNOSIS — I130 Hypertensive heart and chronic kidney disease with heart failure and stage 1 through stage 4 chronic kidney disease, or unspecified chronic kidney disease: Secondary | ICD-10-CM | POA: Diagnosis not present

## 2020-12-24 DIAGNOSIS — I110 Hypertensive heart disease with heart failure: Secondary | ICD-10-CM | POA: Diagnosis not present

## 2020-12-24 DIAGNOSIS — M79672 Pain in left foot: Secondary | ICD-10-CM

## 2020-12-24 DIAGNOSIS — D509 Iron deficiency anemia, unspecified: Secondary | ICD-10-CM | POA: Diagnosis not present

## 2020-12-24 DIAGNOSIS — Z6831 Body mass index (BMI) 31.0-31.9, adult: Secondary | ICD-10-CM | POA: Diagnosis not present

## 2020-12-24 DIAGNOSIS — Z7901 Long term (current) use of anticoagulants: Secondary | ICD-10-CM | POA: Diagnosis not present

## 2020-12-24 DIAGNOSIS — I4891 Unspecified atrial fibrillation: Secondary | ICD-10-CM | POA: Diagnosis present

## 2020-12-24 DIAGNOSIS — I10 Essential (primary) hypertension: Secondary | ICD-10-CM | POA: Diagnosis present

## 2020-12-24 DIAGNOSIS — I5043 Acute on chronic combined systolic (congestive) and diastolic (congestive) heart failure: Secondary | ICD-10-CM | POA: Diagnosis not present

## 2020-12-24 DIAGNOSIS — M1990 Unspecified osteoarthritis, unspecified site: Secondary | ICD-10-CM | POA: Diagnosis present

## 2020-12-24 DIAGNOSIS — I5042 Chronic combined systolic (congestive) and diastolic (congestive) heart failure: Secondary | ICD-10-CM | POA: Diagnosis present

## 2020-12-24 DIAGNOSIS — E440 Moderate protein-calorie malnutrition: Secondary | ICD-10-CM | POA: Insufficient documentation

## 2020-12-24 DIAGNOSIS — I484 Atypical atrial flutter: Secondary | ICD-10-CM | POA: Diagnosis not present

## 2020-12-24 DIAGNOSIS — I517 Cardiomegaly: Secondary | ICD-10-CM | POA: Diagnosis not present

## 2020-12-24 DIAGNOSIS — Z8249 Family history of ischemic heart disease and other diseases of the circulatory system: Secondary | ICD-10-CM | POA: Diagnosis not present

## 2020-12-24 DIAGNOSIS — Z20822 Contact with and (suspected) exposure to covid-19: Secondary | ICD-10-CM | POA: Diagnosis present

## 2020-12-24 DIAGNOSIS — Z79899 Other long term (current) drug therapy: Secondary | ICD-10-CM

## 2020-12-24 DIAGNOSIS — N179 Acute kidney failure, unspecified: Secondary | ICD-10-CM | POA: Diagnosis present

## 2020-12-24 DIAGNOSIS — Z96611 Presence of right artificial shoulder joint: Secondary | ICD-10-CM | POA: Diagnosis present

## 2020-12-24 DIAGNOSIS — E663 Overweight: Secondary | ICD-10-CM | POA: Diagnosis present

## 2020-12-24 DIAGNOSIS — D631 Anemia in chronic kidney disease: Secondary | ICD-10-CM | POA: Diagnosis not present

## 2020-12-24 DIAGNOSIS — I4519 Other right bundle-branch block: Secondary | ICD-10-CM | POA: Diagnosis present

## 2020-12-24 DIAGNOSIS — I4892 Unspecified atrial flutter: Secondary | ICD-10-CM | POA: Diagnosis present

## 2020-12-24 DIAGNOSIS — Z7951 Long term (current) use of inhaled steroids: Secondary | ICD-10-CM

## 2020-12-24 DIAGNOSIS — E785 Hyperlipidemia, unspecified: Secondary | ICD-10-CM | POA: Diagnosis present

## 2020-12-24 DIAGNOSIS — I5023 Acute on chronic systolic (congestive) heart failure: Secondary | ICD-10-CM | POA: Diagnosis present

## 2020-12-24 DIAGNOSIS — J449 Chronic obstructive pulmonary disease, unspecified: Secondary | ICD-10-CM | POA: Diagnosis present

## 2020-12-24 DIAGNOSIS — E1122 Type 2 diabetes mellitus with diabetic chronic kidney disease: Secondary | ICD-10-CM | POA: Diagnosis not present

## 2020-12-24 DIAGNOSIS — Z8616 Personal history of COVID-19: Secondary | ICD-10-CM | POA: Diagnosis not present

## 2020-12-24 DIAGNOSIS — H9193 Unspecified hearing loss, bilateral: Secondary | ICD-10-CM | POA: Diagnosis present

## 2020-12-24 DIAGNOSIS — Z6827 Body mass index (BMI) 27.0-27.9, adult: Secondary | ICD-10-CM

## 2020-12-24 DIAGNOSIS — M1A9XX0 Chronic gout, unspecified, without tophus (tophi): Secondary | ICD-10-CM | POA: Diagnosis not present

## 2020-12-24 DIAGNOSIS — Z87891 Personal history of nicotine dependence: Secondary | ICD-10-CM

## 2020-12-24 DIAGNOSIS — H409 Unspecified glaucoma: Secondary | ICD-10-CM | POA: Diagnosis not present

## 2020-12-24 DIAGNOSIS — Z8673 Personal history of transient ischemic attack (TIA), and cerebral infarction without residual deficits: Secondary | ICD-10-CM

## 2020-12-24 DIAGNOSIS — I5041 Acute combined systolic (congestive) and diastolic (congestive) heart failure: Secondary | ICD-10-CM | POA: Diagnosis present

## 2020-12-24 DIAGNOSIS — N184 Chronic kidney disease, stage 4 (severe): Secondary | ICD-10-CM | POA: Diagnosis present

## 2020-12-24 DIAGNOSIS — Z880 Allergy status to penicillin: Secondary | ICD-10-CM

## 2020-12-24 LAB — BASIC METABOLIC PANEL
Anion gap: 9 (ref 5–15)
BUN: 23 mg/dL (ref 8–23)
CO2: 21 mmol/L — ABNORMAL LOW (ref 22–32)
Calcium: 8.8 mg/dL — ABNORMAL LOW (ref 8.9–10.3)
Chloride: 113 mmol/L — ABNORMAL HIGH (ref 98–111)
Creatinine, Ser: 2.19 mg/dL — ABNORMAL HIGH (ref 0.61–1.24)
GFR, Estimated: 29 mL/min — ABNORMAL LOW (ref >60)
Glucose, Bld: 93 mg/dL (ref 70–99)
Potassium: 4.5 mmol/L (ref 3.5–5.1)
Sodium: 143 mmol/L (ref 135–145)

## 2020-12-24 LAB — CBC
HCT: 38.5 % — ABNORMAL LOW (ref 39.0–52.0)
Hemoglobin: 11.4 g/dL — ABNORMAL LOW (ref 13.0–17.0)
MCH: 22.7 pg — ABNORMAL LOW (ref 26.0–34.0)
MCHC: 29.6 g/dL — ABNORMAL LOW (ref 30.0–36.0)
MCV: 76.5 fL — ABNORMAL LOW (ref 80.0–100.0)
Platelets: 273 10*3/uL (ref 150–400)
RBC: 5.03 MIL/uL (ref 4.22–5.81)
RDW: 21.9 % — ABNORMAL HIGH (ref 11.5–15.5)
WBC: 9.2 10*3/uL (ref 4.0–10.5)
nRBC: 0 % (ref 0.0–0.2)

## 2020-12-24 LAB — RESP PANEL BY RT-PCR (FLU A&B, COVID) ARPGX2
Influenza A by PCR: NEGATIVE
Influenza B by PCR: NEGATIVE
SARS Coronavirus 2 by RT PCR: NEGATIVE

## 2020-12-24 LAB — MAGNESIUM: Magnesium: 2.1 mg/dL (ref 1.7–2.4)

## 2020-12-24 LAB — BRAIN NATRIURETIC PEPTIDE: B Natriuretic Peptide: 1084.1 pg/mL — ABNORMAL HIGH (ref 0.0–100.0)

## 2020-12-24 SURGERY — CANCELLED PROCEDURE

## 2020-12-24 MED ORDER — ALBUTEROL SULFATE (2.5 MG/3ML) 0.083% IN NEBU: 2.5000 mg | INHALATION_SOLUTION | RESPIRATORY_TRACT | Status: DC | PRN

## 2020-12-24 MED ORDER — BISACODYL 5 MG PO TBEC: 5.0000 mg | DELAYED_RELEASE_TABLET | Freq: Every day | ORAL | Status: DC | PRN

## 2020-12-24 MED ORDER — AMIODARONE IV BOLUS ONLY 150 MG/100ML: 150.0000 mg | Freq: Once | INTRAVENOUS | Status: DC

## 2020-12-24 MED ORDER — ENSURE ENLIVE PO LIQD
237.0000 mL | Freq: Two times a day (BID) | ORAL | Status: DC
Start: 2020-12-24 — End: 2020-12-30
  Administered 2020-12-24 – 2020-12-30 (×10): 237 mL via ORAL

## 2020-12-24 MED ORDER — SODIUM CHLORIDE 0.9 % IV SOLN: INTRAVENOUS | Status: DC

## 2020-12-24 MED ORDER — ADULT MULTIVITAMIN W/MINERALS CH
1.0000 | ORAL_TABLET | Freq: Every day | ORAL | Status: DC
  Administered 2020-12-24 – 2020-12-30 (×7): 1 via ORAL
  Filled 2020-12-24 (×7): qty 1

## 2020-12-24 MED ORDER — DEXTROSE 5 % IV SOLN: 150.0000 mg | Freq: Once | INTRAVENOUS | Status: DC

## 2020-12-24 MED ORDER — DOCUSATE SODIUM 100 MG PO CAPS
100.0000 mg | ORAL_CAPSULE | Freq: Two times a day (BID) | ORAL | Status: DC
  Administered 2020-12-24 – 2020-12-29 (×10): 100 mg via ORAL
  Filled 2020-12-24 (×13): qty 1

## 2020-12-24 MED ORDER — LATANOPROST 0.005 % OP SOLN
1.0000 [drp] | Freq: Every day | OPHTHALMIC | Status: DC
  Administered 2020-12-25 – 2020-12-29 (×6): 1 [drp] via OPHTHALMIC
  Filled 2020-12-24 (×2): qty 2.5

## 2020-12-24 MED ORDER — AMIODARONE LOAD VIA INFUSION
150.0000 mg | Freq: Once | INTRAVENOUS | Status: DC
  Filled 2020-12-24: qty 83.34

## 2020-12-24 MED ORDER — ACETAMINOPHEN 325 MG PO TABS
650.0000 mg | ORAL_TABLET | Freq: Four times a day (QID) | ORAL | Status: DC | PRN
  Administered 2020-12-28: 22:00:00 650 mg via ORAL
  Filled 2020-12-24: qty 2

## 2020-12-24 MED ORDER — BUDESONIDE 0.25 MG/2ML IN SUSP: 0.2500 mg | Freq: Two times a day (BID) | RESPIRATORY_TRACT | Status: DC | PRN

## 2020-12-24 MED ORDER — HYDRALAZINE HCL 20 MG/ML IJ SOLN: 5.0000 mg | INTRAMUSCULAR | Status: DC | PRN

## 2020-12-24 MED ORDER — ONDANSETRON HCL 4 MG/2ML IJ SOLN: 4.0000 mg | Freq: Four times a day (QID) | INTRAMUSCULAR | Status: DC | PRN

## 2020-12-24 MED ORDER — AMIODARONE HCL IN DEXTROSE 360-4.14 MG/200ML-% IV SOLN
30.0000 mg/h | INTRAVENOUS | Status: DC
  Administered 2020-12-24 – 2020-12-28 (×9): 30 mg/h via INTRAVENOUS
  Filled 2020-12-24 (×8): qty 200

## 2020-12-24 MED ORDER — AMIODARONE LOAD VIA INFUSION
150.0000 mg | Freq: Once | INTRAVENOUS | Status: DC
  Administered 2020-12-24: 150 mg via INTRAVENOUS
  Filled 2020-12-24: qty 83.34

## 2020-12-24 MED ORDER — AMIODARONE HCL IN DEXTROSE 360-4.14 MG/200ML-% IV SOLN
60.0000 mg/h | INTRAVENOUS | Status: DC
  Administered 2020-12-24: 60 mg/h via INTRAVENOUS
  Filled 2020-12-24: qty 200

## 2020-12-24 MED ORDER — MOMETASONE FURO-FORMOTEROL FUM 200-5 MCG/ACT IN AERO
2.0000 | INHALATION_SPRAY | Freq: Two times a day (BID) | RESPIRATORY_TRACT | Status: DC
  Administered 2020-12-24 – 2020-12-29 (×10): 2 via RESPIRATORY_TRACT
  Filled 2020-12-24: qty 8.8

## 2020-12-24 MED ORDER — ACETAMINOPHEN 500 MG PO TABS
500.0000 mg | ORAL_TABLET | Freq: Three times a day (TID) | ORAL | Status: DC | PRN
Start: 2020-12-24 — End: 2020-12-24

## 2020-12-24 MED ORDER — ACETAMINOPHEN 650 MG RE SUPP: 650.0000 mg | Freq: Four times a day (QID) | RECTAL | Status: DC | PRN

## 2020-12-24 MED ORDER — ONDANSETRON HCL 4 MG PO TABS: 4.0000 mg | ORAL_TABLET | Freq: Four times a day (QID) | ORAL | Status: DC | PRN

## 2020-12-24 MED ORDER — OXYCODONE HCL 5 MG PO TABS
5.0000 mg | ORAL_TABLET | ORAL | Status: DC | PRN
  Administered 2020-12-26 – 2020-12-27 (×2): 5 mg via ORAL
  Filled 2020-12-24 (×2): qty 1

## 2020-12-24 MED ORDER — FUROSEMIDE 10 MG/ML IJ SOLN
40.0000 mg | Freq: Two times a day (BID) | INTRAMUSCULAR | Status: DC
  Administered 2020-12-24 (×2): 40 mg via INTRAVENOUS
  Filled 2020-12-24 (×3): qty 4

## 2020-12-24 MED ORDER — POLYETHYLENE GLYCOL 3350 17 G PO PACK
17.0000 g | PACK | Freq: Every day | ORAL | Status: DC | PRN
  Administered 2020-12-28: 18:00:00 17 g via ORAL
  Filled 2020-12-24: qty 1

## 2020-12-24 MED ORDER — ALLOPURINOL 100 MG PO TABS
100.0000 mg | ORAL_TABLET | Freq: Every day | ORAL | Status: DC
  Administered 2020-12-24 – 2020-12-30 (×7): 100 mg via ORAL
  Filled 2020-12-24 (×7): qty 1

## 2020-12-24 MED ORDER — AMLODIPINE BESYLATE 5 MG PO TABS
5.0000 mg | ORAL_TABLET | Freq: Every day | ORAL | Status: DC
  Administered 2020-12-25 – 2020-12-30 (×6): 5 mg via ORAL
  Filled 2020-12-24 (×6): qty 1

## 2020-12-24 MED ORDER — PANTOPRAZOLE SODIUM 20 MG PO TBEC
20.0000 mg | DELAYED_RELEASE_TABLET | Freq: Every day | ORAL | Status: DC
  Administered 2020-12-24 – 2020-12-30 (×7): 20 mg via ORAL
  Filled 2020-12-24 (×8): qty 1

## 2020-12-24 MED ORDER — MORPHINE SULFATE (PF) 2 MG/ML IV SOLN: 2.0000 mg | INTRAVENOUS | Status: DC | PRN

## 2020-12-24 MED ORDER — APIXABAN 2.5 MG PO TABS
2.5000 mg | ORAL_TABLET | Freq: Two times a day (BID) | ORAL | Status: DC
  Administered 2020-12-24 – 2020-12-30 (×12): 2.5 mg via ORAL
  Filled 2020-12-24 (×12): qty 1

## 2020-12-24 NOTE — Progress Notes (Signed)
Heart Failure Nurse Navigator Progress Note  Scheduled hospital f/u for HV TOC clinic 1/3 @ 11AM. Gave paperwork to patient. Brief assessment/interview performed.   No immediate social needs noted. Has private transportation, self or family. Family at bedside, live nearby.   Explained benefits of Heart & Vascular Transitions of Care Clinic appointment, patient agreeable.   Pricilla Holm, MSN, RN Heart Failure Nurse Navigator [phone]

## 2020-12-24 NOTE — Progress Notes (Signed)
Mobility Specialist Progress Note:   12/24/20 1530  Mobility  Activity Ambulated in hall  Level of Assistance Contact guard assist, steadying assist  Assistive Device None  Distance Ambulated (ft) 100 ft  Mobility Ambulated with assistance in hallway  Mobility Response Tolerated well  Mobility performed by Mobility specialist  $Mobility charge 1 Mobility   Pt received in bed willing to participate in mobility. No complaints of pain. Pt returned to bed with call bell I reach and all needs met.   [facility] Public librarian Phone [phone] Secondary Phone [phone]

## 2020-12-24 NOTE — Progress Notes (Signed)
Pt presented to endoscopy today for direct current cardioversion. Procedure cancelled by Dr Johney Frame due to fluid overload.  Debarah Crape, BSN, RN

## 2020-12-24 NOTE — ED Provider Notes (Signed)
[facility] EMERGENCY DEPARTMENT Provider Note   CSN: [id number] Arrival date & time: 12/24/20  0850     History Chief Complaint  Patient presents with   Shortness of Breath    Nicholas [name] is a 87 y.o. male with PMHx HTN, CKD, and CHF with EF 40-45% who presents to the ED today with complaint of SOB.  Per chart review patient was initially scheduled for cardioversion today however on arrival found to be significantly fluid overloaded and cardiology requested that hospitalist admit to medicine for CHF exacerbation and IV diuresis.  Patient was brought down to the ED at that time.  His daughter is at bedside.  Reports that he has been short of breath for a prolonged period of time however has recently worsened over the past week.  She does mention that he was on a fluid pill however was recently taken off due to worsening kidney function.  Also complains of worsening swelling in his legs.  He denies any chest pain.   Recent pro BNP 1 month ago severely elevated at 6081.  Repeat on 12/07 at 18,617.   The history is provided by the patient, a relative and medical records.      Past Medical History:  Diagnosis Date   Anemia    none recent   Arthritis    Asthma    Chronic kidney disease    ckd stage 3   Family history of adverse reaction to anesthesia    daughter has ponv    Glaucoma    Hypertension     Patient Active Problem List   Diagnosis Date Noted   Acute on chronic combined systolic and diastolic CHF (congestive heart failure) ([place]) 12/24/2020   Iron deficiency anemia 11/04/2020   Anemia 09/22/2020   Arthritis 09/22/2020   Asthma 09/22/2020   Family history of adverse reaction to anesthesia 09/22/2020   Glaucoma 09/22/2020   Pneumonia due to COVID-19 virus 07/19/2018   COVID-19 virus infection 07/18/2018   Renal insufficiency 11/27/2017   Hyperlipidemia 11/02/2017   TIA (transient ischemic attack) 11/01/2017   CHF (congestive heart failure)  ([place]) 11/01/2017   HTN (hypertension) 11/01/2017    Past Surgical History:  Procedure Laterality Date   COLONOSCOPY WITH PROPOFOL N/A 02/03/2015   Procedure: COLONOSCOPY WITH PROPOFOL;  Surgeon: Arta Silence, MD;  Location: [facility] END[facility]COPY;  Service: Endoscopy;  Laterality: N/A;   KNEE ARTHROSCOPY Left    ROTATOR CUFF REPAIR Right    SHOULDER ARTHROSCOPY Left        Family History  Problem Relation Age of Onset   Hypertension Mother    Hypertension Father     Social History   Tobacco Use   Smoking status: Former    Packs/day: 1.00    Years: 30.00    Pack years: 30.00    Types: Cigarettes   Smokeless tobacco: Never   Tobacco comments:    quit 35 yrs ago  Substance Use Topics   Alcohol use: No   Drug use: No    Home Medications Prior to Admission medications   Medication Sig Start Date End Date Taking? Authorizing Provider  acetaminophen (TYLENOL) 500 MG tablet Take 500-1,000 mg by mouth every 8 (eight) hours as needed (for pain or headaches).    Yes [provider]  albuterol (PROVENTIL HFA;VENTOLIN HFA) 108 (90 Base) MCG/ACT inhaler Inhale 2 puffs into the lungs every 4 (four) hours as needed for wheezing or shortness of breath.   Yes [provider]  allopurinol (ZYLOPRIM) 100 MG tablet Take 100 mg by mouth daily.   Yes [provider]  amLODipine (NORVASC) 5 MG tablet Take 5 mg by mouth daily. 10/11/20  Yes [provider]  apixaban (ELIQUIS) 2.5 MG TABS tablet Take 1 tablet (2.5 mg total) by mouth 2 (two) times daily. 07/21/18  Yes Marcelyn Bruins, MD  Calcium Carb-Cholecalciferol (CALCIUM 600+D3 PO) Take 2 tablets by mouth daily.   Yes [provider]  diltiazem (CARDIZEM) 30 MG tablet Take 1 tablet (30 mg total) by mouth 3 (three) times daily. 10/07/20  Yes Richardo Priest, MD  Fluticasone-Salmeterol (ADVAIR) 250-50 MCG/DOSE AEPB Inhale 1 puff into the lungs 2 (two) times daily.   Yes [provider]   latanoprost (XALATAN) 0.005 % ophthalmic solution Place 1 drop into both eyes at bedtime. 07/26/17  Yes [provider]  Multiple Vitamin (MULTIVITAMIN WITH MINERALS) TABS tablet Take 1 tablet by mouth every morning.   Yes [provider]  Omega-3 Fatty Acids (FISH OIL) 1000 MG CAPS Take 1,000 mg by mouth daily.   Yes [provider]  pantoprazole (PROTONIX) 20 MG tablet Take 1 tablet (20 mg total) by mouth daily. 04/01/18  Yes Garvin Fila, MD    Allergies    Penicillins  Review of Systems   Review of Systems  Constitutional:  Negative for fever.  Respiratory:  Positive for shortness of breath.   Cardiovascular:  Positive for leg swelling. Negative for chest pain.  All other systems reviewed and are negative.  Physical Exam Updated Vital Signs BP 139/87    Pulse (!) 122    Temp 97.7 F (36.5 C) (Oral)    Resp 18    Ht 5\' 9"  (1.753 m)    Wt 95.3 kg    SpO2 98%    BMI 31.01 kg/m   Physical Exam Vitals and nursing note reviewed.  Constitutional:      Appearance: He is not ill-appearing.  HENT:     Head: Normocephalic and atraumatic.  Eyes:     Conjunctiva/sclera: Conjunctivae normal.  Cardiovascular:     Rate and Rhythm: Tachycardia present. Rhythm irregularly irregular.  Pulmonary:     Effort: Pulmonary effort is normal.     Breath sounds: Wheezing present. No rhonchi or rales.  Abdominal:     Palpations: Abdomen is soft.     Tenderness: There is no abdominal tenderness.  Musculoskeletal:     Cervical back: Neck supple.  Skin:    General: Skin is warm and dry.  Neurological:     Mental Status: He is alert.    ED Results / Procedures / Treatments   Labs (all labs ordered are listed, but only abnormal results are displayed) Labs Reviewed  CBC - Abnormal; Notable for the following components:      Result Value   Hemoglobin 11.4 (*)    HCT 38.5 (*)    MCV 76.5 (*)    MCH 22.7 (*)    MCHC 29.6 (*)    RDW 21.9 (*)    All other  components within normal limits  RESP PANEL BY RT-PCR (FLU A&B, COVID) ARPGX2  BASIC METABOLIC PANEL  MAGNESIUM  BRAIN NATRIURETIC PEPTIDE    EKG None  Radiology No results found.  Procedures Procedures   Medications Ordered in ED Medications  furosemide (LASIX) injection 40 mg (40 mg Intravenous Given 12/24/20 0917)  apixaban (ELIQUIS) tablet 2.5 mg (has no administration in time range)  pantoprazole (PROTONIX) EC tablet 20  mg (20 mg Oral Given 12/24/20 0917)  amiodarone (NEXTERONE) IV bolus only 150 mg/100 mL (has no administration in time range)  amiodarone (CORDARONE) 150 mg in dextrose 5 % 100 mL bolus (has no administration in time range)  allopurinol (ZYLOPRIM) tablet 100 mg (has no administration in time range)  amLODipine (NORVASC) tablet 5 mg (has no administration in time range)  mometasone-formoterol (DULERA) 200-5 MCG/ACT inhaler 2 puff (has no administration in time range)  latanoprost (XALATAN) 0.005 % ophthalmic solution 1 drop (has no administration in time range)  acetaminophen (TYLENOL) tablet 650 mg (has no administration in time range)    Or  acetaminophen (TYLENOL) suppository 650 mg (has no administration in time range)  oxyCODONE (Oxy IR/ROXICODONE) immediate release tablet 5 mg (has no administration in time range)  morphine 2 MG/ML injection 2 mg (has no administration in time range)  docusate sodium (COLACE) capsule 100 mg (has no administration in time range)  polyethylene glycol (MIRALAX / GLYCOLAX) packet 17 g (has no administration in time range)  bisacodyl (DULCOLAX) EC tablet 5 mg (has no administration in time range)  ondansetron (ZOFRAN) tablet 4 mg (has no administration in time range)    Or  ondansetron (ZOFRAN) injection 4 mg (has no administration in time range)  hydrALAZINE (APRESOLINE) injection 5 mg (has no administration in time range)    ED Course  I have reviewed the triage vital signs and the nursing notes.  Pertinent labs &  imaging results that were available during my care of the patient were reviewed by me and considered in my medical decision making (see chart for details).    MDM Rules/Calculators/A&P                          87 year old male who presents to the ED today from Endo after cardioversion canceled due to worsening BNP and concern for CHF exacerbation.  Discussed case with Dr. Inda Merlin who was consulted from upstairs for admission.  She was unaware that he was in the ER and will come see him with plans for admission.  Admission orders have been placed by her.  On exam patient is tachycardic with audible wheezing.  He is satting 98% on room air however.  Otherwise he is stable and denies any chest pain.  We will plan for repeat labs at this time including CBC, BMP MP, BMP, mag as well as COVID testing.   This note was prepared using Dragon voice recognition software and may include unintentional dictation errors due to the inherent limitations of voice recognition software.     Final Clinical Impression(s) / ED Diagnoses Final diagnoses:  Acute on chronic congestive heart failure, unspecified heart failure type [facility])  Atrial fibrillation, unspecified type [facility])    Rx / DC Orders ED Discharge Orders          Ordered    Amb Referral to HF Clinic        12/24/20 1015             Eustaquio Maize, PA-C 12/24/20 1032    Kommor, Hepz[place], MD 12/24/20 1610

## 2020-12-24 NOTE — Consult Note (Signed)
Cardiology Consultation:   Patient ID: Nicholas [name] MRN: [id number]; DOB: Jan 24, 1933  Admit date: 12/24/2020 Date of Consult: 12/24/2020  PCP:  Street, [name], MD   [facility] Providers Cardiologist:  None   {   Patient Profile:   Nicholas [name] is a 87 y.o. male with a hx of HTN, CKD stage IIIb, COPD and heart failure with mildly reduced LVEF who is being seen 12/24/2020 for the evaluation of volume overload and aflutter with RVR at the request of Dr. Lorin Mercy.  History of Present Illness:   Nicholas [name] is a 87 year old male with history as detailed above who is followed by Dr. Bettina Gavia as an out-patient. He has newly diagnosed HFrEF with LVEF 40-45% (previously 55-60%) as well as Aflutter. He was last seen by Dr. Bettina Gavia on 12/15/20 where he was noted to be in [place] with RVR with planned DCCV today. Labs at that visit notable for NT-proBNP 18617 from 6000 in 10/2020. Notably his diuretic (lasix 20mg  daily) was being held for AKI on CKD.   Today, the patient presented for planned DCCV as was noted to be overloaded on examination with elevated JVD and audible wheezing. He reports he has been feeling progressively more SOB and has been having L>R LE edema. Also notes that his weight has been steadily rising over the past couple of months (was 210lbs today; states he was previously about 194lbs). Tele with Aflutter with RVR. Given that he was decompensated from a HF standpoint, the decision was made to admit for IV diuresis prior to DCCV.    Past Medical History:  Diagnosis Date   Anemia    none recent   Arthritis    Asthma    Chronic kidney disease    ckd stage 3   Family history of adverse reaction to anesthesia    daughter has ponv    Glaucoma    Hypertension     Past Surgical History:  Procedure Laterality Date   COLONOSCOPY WITH PROPOFOL N/A 02/03/2015   Procedure: COLONOSCOPY WITH PROPOFOL;  Surgeon: Arta Silence, MD;  Location: [facility];  Service:  Endoscopy;  Laterality: N/A;   KNEE ARTHROSCOPY Left    ROTATOR CUFF REPAIR Right    SHOULDER ARTHROSCOPY Left      Home Medications:  Prior to Admission medications   Medication Sig Start Date End Date Taking? Authorizing Provider  acetaminophen (TYLENOL) 500 MG tablet Take 500-1,000 mg by mouth every 8 (eight) hours as needed (for pain or headaches).    Yes [provider]  albuterol (PROVENTIL HFA;VENTOLIN HFA) 108 (90 Base) MCG/ACT inhaler Inhale 2 puffs into the lungs every 4 (four) hours as needed for wheezing or shortness of breath.   Yes [provider]  allopurinol (ZYLOPRIM) 100 MG tablet Take 100 mg by mouth daily.   Yes [provider]  amLODipine (NORVASC) 5 MG tablet Take 5 mg by mouth daily. 10/11/20  Yes [provider]  apixaban (ELIQUIS) 2.5 MG TABS tablet Take 1 tablet (2.5 mg total) by mouth 2 (two) times daily. 07/21/18  Yes Marcelyn Bruins, MD  Calcium Carb-Cholecalciferol (CALCIUM 600+D3 PO) Take 2 tablets by mouth daily.   Yes [provider]  diltiazem (CARDIZEM) 30 MG tablet Take 1 tablet (30 mg total) by mouth 3 (three) times daily. 10/07/20  Yes Richardo Priest, MD  Fluticasone-Salmeterol (ADVAIR) 250-50 MCG/DOSE AEPB Inhale 1 puff into the lungs 2 (two) times daily.   Yes [provider]  latanoprost (XALATAN) 0.005 % ophthalmic solution Place 1 drop into both eyes at bedtime. 07/26/17  Yes [provider]  Multiple Vitamin (MULTIVITAMIN WITH MINERALS) TABS tablet Take 1 tablet by mouth every morning.   Yes [provider]  Omega-3 Fatty Acids (FISH OIL) 1000 MG CAPS Take 1,000 mg by mouth daily.   Yes [provider]  pantoprazole (PROTONIX) 20 MG tablet Take 1 tablet (20 mg total) by mouth daily. 04/01/18  Yes Garvin Fila, MD    Inpatient Medications: Scheduled Meds:  amiodarone  150 mg Intravenous Once   apixaban  2.5 mg Oral BID   furosemide  40 mg Intravenous BID    pantoprazole  20 mg Oral Daily   Continuous Infusions:   PRN Meds: acetaminophen, albuterol, budesonide (PULMICORT) nebulizer solution  Allergies:    Allergies  Allergen Reactions   Penicillins Hives    Childhood allergy Has patient had a PCN reaction causing immediate rash, facial/tongue/throat swelling, SOB or lightheadedness with hypotension: Yes Has patient had a PCN reaction causing severe rash involving mucus membranes or skin necrosis: Yes Has patient had a PCN reaction that required hospitalization: Yes Has patient had a PCN reaction occurring within the last 10 years: No If all of the above answers are "NO", then may proceed with Cephalosporin use.     Social History:   Social History   Socioeconomic History   Marital status: Widowed    Spouse name: Not on file   Number of children: 5   Years of education: 8   Highest education level: Not on file  Occupational History   Occupation: Retired   Tobacco Use   Smoking status: Former   Smokeless tobacco: Never   Tobacco comments:    quit 35 yrs ago  Substance and Sexual Activity   Alcohol use: No   Drug use: No   Sexual activity: Not on file  Other Topics Concern   Not on file  Social History Narrative   11/28/18 Lives with grandson, [name].    Caffeine use: daily   Right handed    Social Determinants of Health   Financial Resource Strain: Not on file  Food Insecurity: Not on file  Transportation Needs: Not on file  Physical Activity: Not on file  Stress: Not on file  Social Connections: Not on file  Intimate Partner Violence: Not on file    Family History:    Family History  Problem Relation Age of Onset   Hypertension Mother    Hypertension Father      ROS:  Please see the history of present illness.  Review of Systems  Constitutional:  Positive for malaise/fatigue. Negative for chills and fever.  HENT:  Positive for hearing loss.   Respiratory:  Positive for shortness of breath and  wheezing.   Cardiovascular:  Positive for palpitations, orthopnea and leg swelling. Negative for chest pain.  Gastrointestinal:  Negative for blood in stool and melena.  Genitourinary:  Negative for hematuria.  Neurological:  Negative for dizziness and loss of consciousness.   All other ROS reviewed and negative.     Physical Exam/Data:   Vitals:   12/24/20 0659 12/24/20 0703 12/24/20 0902 12/24/20 0905  BP: (!) 129/91  139/87   Pulse: (!) 122  (!) 122   Resp: 19  18   Temp:  97.7 F (36.5 C) 97.7 F (36.5 C)   TempSrc:  Oral Oral   SpO2: 96%  98%   Weight: 95.3 kg   95.3  kg  Height: 5\' 9"  (1.753 m)   5\' 9"  (1.753 m)   No intake or output data in the 24 hours ending 12/24/20 0907 Last 3 Weights 12/24/2020 12/24/2020 12/15/2020  Weight (lbs) 210 lb 210 lb 1.6 oz 210 lb  Weight (kg) 95.255 kg 95.3 kg 95.255 kg     Body mass index is 31.01 kg/m.  General:  Elderly male, hard of hearing, speaking in short sentences HEENT: normal Neck: JVD to the ear Vascular: No carotid bruits; Distal pulses 2+ bilaterally Cardiac:  Regular, tachycardic, 2/6 harsh systolic murmur RUSB Lungs:  Diminished, expiratory wheezing Abd: soft, nontender, no hepatomegaly  Ext: 1+ pitting edema with L>R Musculoskeletal:  No deformities, BUE and BLE strength normal and equal Skin: warm and dry  Neuro:  CNs 2-12 intact, no focal abnormalities noted Psych:  Normal affect   EKG:  The EKG was personally reviewed and demonstrates: Aflutter with RVR with occasional PVCs Telemetry:  Telemetry was personally reviewed and demonstrates:  Aflutter with RVR with occasional PVCs  Relevant CV Studies: TTE 10/26/18: IMPRESSIONS   1. Rapid atrial fibrillation/ flutter. Left ventricular ejection  fraction, by estimation, is 40 to 45%. The left ventricle has mildly  decreased function. The left ventricle has no regional wall motion  abnormalities. There is moderate concentric left  ventricular hypertrophy. Left  ventricular diastolic parameters are  indeterminate.   2. Right ventricular systolic function is normal. The right ventricular  size is normal. There is moderately elevated pulmonary artery systolic  pressure.   3. Left atrial size was moderately dilated.   4. The mitral valve is normal in structure. Mild mitral valve  regurgitation. No evidence of mitral stenosis.   5. The aortic valve is tricuspid. There is mild calcification of the  aortic valve. There is mild thickening of the aortic valve. Aortic valve  regurgitation is mild. Mild aortic valve stenosis.   6. There is moderate dilatation of the ascending aorta, measuring 39 mm.   7. The inferior vena cava is dilated in size with <50% respiratory  variability, suggesting right atrial pressure of 15 mmHg.   Laboratory Data:  High Sensitivity Troponin:  No results for input(s): TROPONINIHS in the last 720 hours.   ChemistryNo results for input(s): NA, K, CL, CO2, GLUCOSE, BUN, CREATININE, CALCIUM, MG, GFRNONAA, GFRAA, ANIONGAP in the last 168 hours.  No results for input(s): PROT, ALBUMIN, AST, ALT, ALKPHOS, BILITOT in the last 168 hours. Lipids No results for input(s): CHOL, TRIG, HDL, LABVLDL, LDLCALC, CHOLHDL in the last 168 hours.  HematologyNo results for input(s): WBC, RBC, HGB, HCT, MCV, MCH, MCHC, RDW, PLT in the last 168 hours. Thyroid No results for input(s): TSH, FREET4 in the last 168 hours.  BNPNo results for input(s): BNP, PROBNP in the last 168 hours.  DDimer No results for input(s): DDIMER in the last 168 hours.   Radiology/Studies:  No results found.   Assessment and Plan:   #Acute on Chronic HFrEF exacerbation: Patient presents with worsening dyspnea and volume overload with BNP 18600 from 6000 consistent with acute on chronic HFrEF exacerbation. TTE in 10/2020 with LVF 40-45%, mild RASP, moderate LAE, mild MR, mild AS, RAP 15. Notably, his home diuretic has been held due to AKI on CKD. Likely patient  decompensated in the setting of aflutter with RVR and holding diuresis. Will plan for IV diuresis prior to DCCV. -Start lasix 40mg  IV BID and monitor response (home lasix dose is 20mg ) -Start metop 25mg  XL daily -Check BMET  to assess renal function today -Will add GDMT as able (entresto, spiro, farxiga) pending renal function -Monitor I/Os and daily weights -Low Na diet  #Aflutter with RVR: CHADs-vasc 5. HR 120s currently. On apixaban for [facility] without skipping doses. Plan for DCCV once more euvolemic. -Start amiodarone bolus +gtt -Start metop 25mg  XL daily -Stop home dilt due to reduced EF -Continue home apixaban 2.5mg  BID (dose adjusted for age and renal function) -Plan for DCCV once more clinically euvolemic  #CKD stage IIIB: Follows with renal as out-patient. Cr down from 3 in 10/27 to 1.87 on 12/7. Baseline ~1.7-1.9. -Check BMET as above -Diuresis as above  #COPD: Audibly wheezing on exam today in the setting of volume overload.  -Continue home inhalers -Management per primary  #HTN: BP in 120s this AM.  -Start metop as above -Will add GDMT as able for HFrEF as above   Risk Assessment/Risk Scores:        New York Heart Association (NYHA) Functional Class NYHA Class III  CHA2DS2-VASc Score = 5  {This indicates a 7.2% annual risk of stroke. The patient's score is based upon: CHF History: 1 HTN History: 1 Diabetes History: 0 Stroke History: 0 Vascular Disease History: 1 Age Score: 2 Gender Score: 0     For questions or updates, please contact [facility] Please consult [email or web address] for contact info under    Signed, Freada Bergeron, MD  12/24/2020 9:07 AM

## 2020-12-24 NOTE — Interval H&P Note (Signed)
History and Physical Interval Note:  12/24/2020 7:36 AM  Nicholas [name]  has presented today for surgery, with the diagnosis of AFLUTTER.  The various methods of treatment have been discussed with the patient and family. After consideration of risks, benefits and other options for treatment, the patient has consented to  Procedure(s): CARDIOVERSION (N/A) as a surgical intervention.  The patient's history has been reviewed, patient examined, no change in status, stable for surgery.  I have reviewed the patient's chart and labs.  Questions were answered to the patient's satisfaction.     Freada Bergeron

## 2020-12-24 NOTE — ED Triage Notes (Signed)
Pt coming from Endo for SOB and elevated BNP. RN states pt was there for outpatient cardioversion but labs were noted and sent to ED.

## 2020-12-24 NOTE — Evaluation (Signed)
Occupational Therapy Evaluation Patient Details Name: Nicholas [name] MRN: [id number] DOB: August 18, 1933 Today's Date: 12/24/2020   History of Present Illness Pt is an 87 y/o male admitted 12/16 for cardioversion, however, cardioversion was cancelled as the pt was volume overloaded. PMH includes HTN, CKD, CHF, and a fib.   Clinical Impression   Pt functions independently at home with his grandson, but has slowed down since he developed afib. Pt presents with mild balance deficits and decreased activity tolerance. Pt likely to progress well, recommending HHOT to address IADL and shower DME in pt's home.      Recommendations for follow up therapy are one component of a multi-disciplinary discharge planning process, led by the attending physician.  Recommendations may be updated based on patient status, additional functional criteria and insurance authorization.   Follow Up Recommendations  Home health OT    Assistance Recommended at Discharge Intermittent Supervision/Assistance  Functional Status Assessment  Patient has had a recent decline in their functional status and demonstrates the ability to make significant improvements in function in a reasonable and predictable amount of time.  Equipment Recommendations  Tub/shower seat (if pt agrees)    Recommendations for Other Services       Precautions / Restrictions Precautions Precautions: Fall      Mobility Bed Mobility Overal bed mobility: Needs Assistance Bed Mobility: Supine to Sit;Sit to Supine     Supine to sit: Supervision Sit to supine: Supervision   General bed mobility comments: no physical assist, HOB up    Transfers Overall transfer level: Needs assistance Equipment used: None Transfers: Sit to/from Stand Sit to Stand: Min guard           General transfer comment: no physical assist, min guard for safety      Balance Overall balance assessment: Needs assistance   Sitting balance-Leahy Scale: Good      Standing balance support: No upper extremity supported;During functional activity Standing balance-Leahy Scale: Fair                             ADL either performed or assessed with clinical judgement   ADL Overall ADL's : Needs assistance/impaired Eating/Feeding: Independent;Sitting   Grooming: Min guard;Standing;Wash/dry hands   Upper Body Bathing: Set up;Sitting   Lower Body Bathing: Min guard;Sit to/from stand   Upper Body Dressing : Set up;Sitting   Lower Body Dressing: Min guard;Sit to/from stand   Toilet Transfer: Min guard;Ambulation   Toileting- Clothing Manipulation and Hygiene: Min guard;Sit to/from stand       Functional mobility during ADLs: Min guard General ADL Comments: fatigues easily, HR 119 at rest     Vision Baseline Vision/History: 1 Wears glasses Patient Visual Report: No change from baseline       Perception     Praxis      Pertinent Vitals/Pain Pain Assessment: No/denies pain     Hand Dominance Right   Extremity/Trunk Assessment Upper Extremity Assessment Upper Extremity Assessment: Overall WFL for tasks assessed   Lower Extremity Assessment Lower Extremity Assessment: Defer to PT evaluation   Cervical / Trunk Assessment Cervical / Trunk Assessment: Normal   Communication Communication Communication: HOH   Cognition Arousal/Alertness: Awake/alert Behavior During Therapy: WFL for tasks assessed/performed Overall Cognitive Status: Within Functional Limits for tasks assessed  General Comments: HOH, will not state if he doesn't hear     General Comments       Exercises     Shoulder Instructions      Home Living Family/patient expects to be discharged to:: Private residence Living Arrangements: Other relatives Available Help at Discharge: Family;Available 24 hours/day Type of Home: House Home Access: Ramped entrance     Home Layout: One level     Bathroom  Shower/Tub: Teacher, early years/pre: Standard     Home Equipment: None          Prior Functioning/Environment Prior Level of Function : Independent/Modified Independent               ADLs Comments: per family, pt has become slightly more unstable with onset of afib        OT Problem List: Impaired balance (sitting and/or standing);Decreased activity tolerance;Cardiopulmonary status limiting activity      OT Treatment/Interventions: Self-care/ADL training;Energy conservation;Balance training;Patient/family education    OT Goals(Current goals can be found in the care plan section) Acute Rehab OT Goals OT Goal Formulation: With patient Time For Goal Achievement: 01/07/21 Potential to Achieve Goals: Good ADL Goals Additional ADL Goal #1: Pt will generalize energy conservation strategies in ADL and mobility. Additional ADL Goal #2: Pt will perform self care modified independently.  OT Frequency: Min 2X/week   Barriers to D/C:            Co-evaluation              AM-PAC OT "6 Clicks" Daily Activity     Outcome Measure Help from another person eating meals?: None Help from another person taking care of personal grooming?: A Little Help from another person toileting, which includes using toliet, bedpan, or urinal?: A Little Help from another person bathing (including washing, rinsing, drying)?: None Help from another person to put on and taking off regular upper body clothing?: A Little   6 Click Score: 17   End of Session    Activity Tolerance: Patient limited by fatigue Patient left: in bed;with call bell/phone within reach;with bed alarm set;with family/visitor present  OT Visit Diagnosis: Unsteadiness on feet (R26.81);Other (comment) (decreased activity tolerance)                Time: 3329-5188 OT Time Calculation (min): 22 min Charges:  OT General Charges $OT Visit: 1 Visit OT Evaluation $OT Eval Low Complexity: [place],  OTR/L Acute Rehabilitation Services Pager: [phone] Office: [phone]  Malka So 12/24/2020, 4:30 PM

## 2020-12-24 NOTE — Progress Notes (Signed)
Procedure cancelled as patient overloaded on exam with crackles, audible wheezing and elevated JVP. Will plan to admit for IV diuresis with DCCV once more euvolemic. Plan discussed with the patient and his daughter who are amenable.   Gwyndolyn Kaufman, MD

## 2020-12-24 NOTE — Progress Notes (Signed)
Initial Nutrition Assessment  DOCUMENTATION CODES:  Non-severe (moderate) malnutrition in context of chronic illness, Obesity unspecified  INTERVENTION:  Add Ensure Plus High Protein po BID, each supplement provides 350 kcal and 20 grams of protein.   Add MVI with minerals daily.  Encourage PO and supplement intake.  NUTRITION DIAGNOSIS:  Moderate Malnutrition related to chronic illness (CHF) as evidenced by moderate fat depletion, moderate muscle depletion.  GOAL:  Patient will meet greater than or equal to 90% of their needs  MONITOR:  PO intake, Supplement acceptance, Labs, Weight trends  REASON FOR ASSESSMENT:  Consult Assessment of nutrition requirement/status  ASSESSMENT:  87 yo male with a PMH of HTN; stage 3b CKD; chronic systolic CHF; atrial flutter presenting with SOB.  He was sent to the hospital for cardioversion. Admitted with volume overload.  Spoke with pt at bedside. Of note, pt very hard of hearing.  Pt reports not eating as much as he used to, but he eats at home. No matter how many different ways the RD asked, the patient would not elaborate on what he has been eating at home.  Per Epic, pt ate 50% of lunch today.  Nursing came in to assess patient's skin very soon into RD's interview.  Pt's weight has increased, per Epic, likely due to volume overload.  Of note, pt with moderate BLE edema.  Pt agreeable to trying Ensure for extra protein at this time.  Medications: reviewed; colace BID, Lasix BID, Protonix  Labs: reviewed  NUTRITION - FOCUSED PHYSICAL EXAM: Flowsheet Row Most Recent Value  Orbital Region Moderate depletion  Upper Arm Region Mild depletion  Thoracic and Lumbar Region No depletion  Buccal Region Moderate depletion  Temple Region Moderate depletion  Clavicle Bone Region Mild depletion  Clavicle and Acromion Bone Region Mild depletion  Scapular Bone Region Unable to assess  Dorsal Hand Mild depletion  Patellar Region Mild  depletion  Anterior Thigh Region Mild depletion  Posterior Calf Region Mild depletion  Edema (RD Assessment) Moderate  Hair Reviewed  Eyes Reviewed  Mouth Reviewed  Skin Reviewed  Nails Reviewed   Diet Order:   Diet Order             Diet Heart Room service appropriate? Yes; Fluid consistency: Thin; Fluid restriction: 1500 mL Fluid  Diet effective now                  EDUCATION NEEDS:  Education needs have been addressed  Skin:  Skin Assessment: Reviewed RN Assessment  Last BM:  12/24/20  Height:  Ht Readings from Last 1 Encounters:  12/24/20 5\' 10"  (1.778 m)   Weight:  Wt Readings from Last 1 Encounters:  12/24/20 95.3 kg   BMI:  Body mass index is 30.13 kg/m.  Estimated Nutritional Needs:  Kcal:  1850-2050 Protein:  115-130 grams Fluid:  >1.85 L  Derrel Nip, RD, LDN (she/her/hers) Clinical Inpatient Dietitian RD Pager/After-Hours/Weekend Pager # in [place]

## 2020-12-24 NOTE — Assessment & Plan Note (Signed)
-  Resume Norvasc tomorrow -Hold Cardizem while on Amio -Will cover with prn IV hydralazine

## 2020-12-24 NOTE — H&P (Signed)
History and Physical    Nicholas [name]:[id number] DOB: Mar 12, 1933 DOA: 12/24/2020  PCP: [name], [name], MD Consultants:  [facility] - oncology; [facility] - cardiology Patient coming from:  Home - lives with grandson and his wife; NOK: Daughter, Durenda Age, [phone]   Chief Complaint: SOB  HPI: Nicholas [name] is a 87 y.o. male with medical history significant of HTN; stage 3b CKD; chronic systolic CHF; atrial flutter presenting with SOB.  He was sent to the hospital for cardioversion.  However, his has too much fluid for it to be effective.  Some DOE.  No cough.  No orthopnea.  +PND.  Weight was 194 -> 211.  Patient was seen in Endo for cardioversion and Dr. Johney Frame requested [facility] admission for volume overload.      ED Course: Came in for scheduled cardioversion.  Overloaded, needs admission.  Pro-BNP elevated on 12/7.  Grossly overloaded.  In flutter with HR in 120s.  Will admit for CHF.  Review of Systems: As per HPI; otherwise review of systems reviewed and negative.   Ambulatory Status:  Ambulates without assistance  COVID Vaccine Status:   Complete plus boosters  Past Medical History:  Diagnosis Date   Anemia    none recent   Arthritis    Asthma    Chronic kidney disease    ckd stage 3   Family history of adverse reaction to anesthesia    daughter has ponv    Glaucoma    Hypertension     Past Surgical History:  Procedure Laterality Date   COLONOSCOPY WITH PROPOFOL N/A 02/03/2015   Procedure: COLONOSCOPY WITH PROPOFOL;  Surgeon: Arta Silence, MD;  Location: [facility] ENDOSCOPY;  Service: Endoscopy;  Laterality: N/A;   KNEE ARTHROSCOPY Left    ROTATOR CUFF REPAIR Right    SHOULDER ARTHROSCOPY Left     Social History   Socioeconomic History   Marital status: Widowed    Spouse name: Not on file   Number of children: 5   Years of education: 8   Highest education level: Not on file  Occupational History   Occupation: Retired   Tobacco Use   Smoking  status: Former    Packs/day: 1.00    Years: 30.00    Pack years: 30.00    Types: Cigarettes   Smokeless tobacco: Never   Tobacco comments:    quit 35 yrs ago  Substance and Sexual Activity   Alcohol use: No   Drug use: No   Sexual activity: Not on file  Other Topics Concern   Not on file  Social History Narrative   11/28/18 Lives with grandson, [name].    Caffeine use: daily   Right handed    Social Determinants of Health   Financial Resource Strain: Not on file  Food Insecurity: Not on file  Transportation Needs: Not on file  Physical Activity: Not on file  Stress: Not on file  Social Connections: Not on file  Intimate Partner Violence: Not on file    Allergies  Allergen Reactions   Penicillins Hives    Childhood allergy Has patient had a PCN reaction causing immediate rash, facial/tongue/throat swelling, SOB or lightheadedness with hypotension: Yes Has patient had a PCN reaction causing severe rash involving mucus membranes or skin necrosis: Yes Has patient had a PCN reaction that required hospitalization: Yes Has patient had a PCN reaction occurring within the last 10 years: No If all of the above answers are "NO", then may proceed with Cephalosporin use.  Family History  Problem Relation Age of Onset   Hypertension Mother    Hypertension Father     Prior to Admission medications   Medication Sig Start Date End Date Taking? Authorizing Provider  acetaminophen (TYLENOL) 500 MG tablet Take 500-1,000 mg by mouth every 8 (eight) hours as needed (for pain or headaches).    Yes [provider]  albuterol (PROVENTIL HFA;VENTOLIN HFA) 108 (90 Base) MCG/ACT inhaler Inhale 2 puffs into the lungs every 4 (four) hours as needed for wheezing or shortness of breath.   Yes [provider]  allopurinol (ZYLOPRIM) 100 MG tablet Take 100 mg by mouth daily.   Yes [provider]  amLODipine (NORVASC) 5 MG tablet Take 5 mg by mouth daily. 10/11/20   Yes [provider]  apixaban (ELIQUIS) 2.5 MG TABS tablet Take 1 tablet (2.5 mg total) by mouth 2 (two) times daily. 07/21/18  Yes Marcelyn Bruins, MD  Calcium Carb-Cholecalciferol (CALCIUM 600+D3 PO) Take 2 tablets by mouth daily.   Yes [provider]  diltiazem (CARDIZEM) 30 MG tablet Take 1 tablet (30 mg total) by mouth 3 (three) times daily. 10/07/20  Yes Richardo Priest, MD  Fluticasone-Salmeterol (ADVAIR) 250-50 MCG/DOSE AEPB Inhale 1 puff into the lungs 2 (two) times daily.   Yes [provider]  latanoprost (XALATAN) 0.005 % ophthalmic solution Place 1 drop into both eyes at bedtime. 07/26/17  Yes [provider]  Multiple Vitamin (MULTIVITAMIN WITH MINERALS) TABS tablet Take 1 tablet by mouth every morning.   Yes [provider]  Omega-3 Fatty Acids (FISH OIL) 1000 MG CAPS Take 1,000 mg by mouth daily.   Yes [provider]  pantoprazole (PROTONIX) 20 MG tablet Take 1 tablet (20 mg total) by mouth daily. 04/01/18  Yes Garvin Fila, MD    Physical Exam: Vitals:   12/24/20 0659 12/24/20 0703 12/24/20 0902 12/24/20 0905  BP: (!) 129/91  139/87   Pulse: (!) 122  (!) 122   Resp: 19  18   Temp:  97.7 F (36.5 C) 97.7 F (36.5 C)   TempSrc:  Oral Oral   SpO2: 96%  98%   Weight: 95.3 kg   95.3 kg  Height: 5\' 9"  (1.753 m)   5\' 9"  (1.753 m)     General:  Appears calm and comfortable and is in NAD, on RA Eyes:  PERRL, EOMI, normal lids, iris ENT:  grossly normal hearing, lips & tongue, mmm; artificial dentition Neck:  no LAD, masses or thyromegaly Cardiovascular:  Irregularly irregular with tachycardia, no m/r/g. 2+ L>R (chronically) LE edema.  Respiratory:   CTA bilaterally with no wheezes/rales/rhonchi.  Normal respiratory effort. Abdomen:  soft, NT, ND Skin:  no rash or induration seen on limited exam Musculoskeletal:  grossly normal tone BUE/BLE, good ROM, no bony abnormality Psychiatric:  grossly normal mood and affect,  speech fluent and appropriate, AOx3 Neurologic:  CN 2-12 grossly intact, moves all extremities in coordinated fashion    Radiological Exams on Admission: Independently reviewed - see discussion in A/P where applicable  No results found. CXR pending  EKG: Independently reviewed.  Afib with rate 121; PVCs; RBBB   Labs on Admission: I have personally reviewed the available labs and imaging studies at the time of the admission.  Pertinent labs:   BMP pending Unremarkable CBC Pro-BNP on 12/7 18617; 6081 on 10/19 BNP pending   Assessment/Plan Principal Problem:   Atrial fibrillation with RVR (HCC) Active Problems:   HTN (hypertension)  Hyperlipidemia   Stage 3b chronic kidney disease (CKD) (HCC)   Glaucoma   Acute on chronic combined systolic and diastolic CHF (congestive heart failure) (HCC)   * Atrial fibrillation with RVR ([place]) -Patient with known afib who presented today for cardioversion -He was in RVR with mild volume overload and so cardiology deferred procedure.  -Cardiology has started Amiodarone -Continue Eliquis -Management per cardiology  Acute on chronic combined systolic and diastolic CHF (congestive heart failure) ([place]) -Patient with known h/o chronic combined CHF - EF 40-45% in 10/2020 and prior h/o grade 1 diastolic dysfunction -He has 2+ LE edema and mild DOE with PND -CXR pending, but he is on room air and appears comfortable at rest -BNP pending -Will place in observation status with telemetry, as there are no current findings necessitating admission (hemodynamic instability, severe electrolyte abnormalities, cardiac arrhythmias, ACS, severe pulmonary edema requiring new O2 therapy, AMS) -Will give Lasix 40 mg IV BID  Glaucoma -Continue Latanoprost  Stage 3b chronic kidney disease (CKD) ([place]) -Baseline stage 3b CKD -Will check BMP today and tomorrow and monitor during diuresis  HTN (hypertension) -Resume Norvasc tomorrow -Hold Cardizem while on  Amio -Will cover with prn IV hydralazine     Note: This patient has been tested and is negative for the novel coronavirus COVID-19. He has been fully vaccinated against COVID-19.    DVT prophylaxis: Eliquis Code Status:  Full - confirmed with patient Family Communication: None present Disposition Plan:  The patient is from: home  Anticipated d/c is to: home, possibly with [facility] services   Anticipated d/c date will depend on clinical response to treatment, but possibly as early as tomorrow ifhe has excellent response to treatment  Patient is currently: acutely ill Consults called: Cardiology  Admission status: It is my clinical opinion that referral for OBSERVATION is reasonable and necessary in this patient based on the above information provided. The aforementioned taken together are felt to place the patient at high risk for further clinical deterioration. However it is anticipated that the patient may be medically stable for discharge from the hospital within 24 to 48 hours.     Karmen Bongo MD [facility]   How to contact the [facility] Attending or Consulting provider [place] or covering provider during after hours [place], for this patient?  Check the care team in [facility] and look for a) attending/consulting [facility] provider listed and b) the [facility] team listed Log into [email or web address] and use [place]'s universal password to access. If you do not have the password, please contact the hospital operator. Locate the [facility] provider you are looking for under [facility] and page to a number that you can be directly reached. If you still have difficulty reaching the provider, please page the [facility] (Director on Call) for the Hospitalists listed on amion for assistance.   12/24/2020, 10:38 AM

## 2020-12-24 NOTE — Assessment & Plan Note (Signed)
-  Patient with known h/o chronic combined CHF - EF 40-45% in 10/2020 and prior h/o grade 1 diastolic dysfunction -He has 2+ LE edema and mild DOE with PND -CXR pending, but he is on room air and appears comfortable at rest -BNP pending -Will place in observation status with telemetry, as there are no current findings necessitating admission (hemodynamic instability, severe electrolyte abnormalities, cardiac arrhythmias, ACS, severe pulmonary edema requiring new O2 therapy, AMS) -Will give Lasix 40 mg IV BID

## 2020-12-24 NOTE — Anesthesia Preprocedure Evaluation (Signed)
Anesthesia Evaluation  Patient identified by MRN, date of birth, ID band Patient awake    Reviewed: Allergy & Precautions, NPO status , Patient's Chart, lab work & pertinent test results  History of Anesthesia Complications Negative for: history of anesthetic complications  Airway Mallampati: II  TM Distance: >3 FB Neck ROM: Full    Dental  (+) Upper Dentures, Lower Dentures   Pulmonary asthma , former smoker,    Pulmonary exam normal        Cardiovascular hypertension, +CHF  + dysrhythmias Atrial Fibrillation  Rhythm:Irregular Rate:Tachycardia   Echo 10/25/20: EF 40-45%, no RWMA, mod LVH, nl RVSF, mod pulm HTN, mod LAE, mild MR, mild AR, mild AS (MG 17, AVA 1.82), ascending aorta 39 mm   Neuro/Psych TIA   GI/Hepatic negative GI ROS, Neg liver ROS,   Endo/Other  negative endocrine ROS  Renal/GU CRFRenal disease (CKD, Cr 1.87)  negative genitourinary   Musculoskeletal  (+) Arthritis ,   Abdominal   Peds  Hematology  (+) anemia ,   Anesthesia Other Findings   Reproductive/Obstetrics                            Anesthesia Physical Anesthesia Plan  ASA: 3  Anesthesia Plan: General   Post-op Pain Management: Minimal or no pain anticipated   Induction: Intravenous  PONV Risk Score and Plan: 2 and TIVA and Treatment may vary due to age or medical condition  Airway Management Planned: Mask  Additional Equipment: None  Intra-op Plan:   Post-operative Plan:   Informed Consent: I have reviewed the patients History and Physical, chart, labs and discussed the procedure including the risks, benefits and alternatives for the proposed anesthesia with the patient or authorized representative who has indicated his/her understanding and acceptance.       Plan Discussed with:   Anesthesia Plan Comments:         Anesthesia Quick Evaluation

## 2020-12-24 NOTE — Assessment & Plan Note (Signed)
-  Continue Latanoprost

## 2020-12-24 NOTE — ED Notes (Signed)
ED TO INPATIENT HANDOFF REPORT  ED Nurse Name and Phone #: Baxter Flattery, RN [phone]  S Name/Age/Gender Nicholas [name] 87 y.o. male Room/Bed: 029C/029C  Code Status   Code Status: Full Code  Home/SNF/Other Home Patient oriented to: self, place, time, and situation Is this baseline? Yes   Triage Complete: Triage complete  Chief Complaint Acute on chronic combined systolic and diastolic CHF (congestive heart failure) ([place]) [I50.43]  Triage Note Pt coming from Endo for SOB and elevated BNP. RN states pt was there for outpatient cardioversion but labs were noted and sent to ED.    Allergies Allergies  Allergen Reactions   Penicillins Hives    Childhood allergy Has patient had a PCN reaction causing immediate rash, facial/tongue/throat swelling, SOB or lightheadedness with hypotension: Yes Has patient had a PCN reaction causing severe rash involving mucus membranes or skin necrosis: Yes Has patient had a PCN reaction that required hospitalization: Yes Has patient had a PCN reaction occurring within the last 10 years: No If all of the above answers are "NO", then may proceed with Cephalosporin use.     Level of Care/Admitting Diagnosis ED Disposition     ED Disposition  Admit   Condition  --   Comment  Hospital Area: [place] [[id number]]  Level of Care: Telemetry Cardiac [[id number]]  May place patient in observation at [facility] or [place] if equivalent level of care is available:: No  Covid Evaluation: Asymptomatic Screening Protocol (No Symptoms)  Diagnosis: Acute on chronic combined systolic and diastolic CHF (congestive heart failure) [facility]) [[id number]]  Admitting Physician: Karmen Bongo [[id number]]  Attending Physician: Karmen Bongo [[id number]]          B Medical/Surgery History Past Medical History:  Diagnosis Date   Anemia    none recent   Arthritis    Asthma    Chronic kidney disease    ckd stage 3   Family history of adverse reaction to anesthesia     daughter has ponv    Glaucoma    Hypertension    Past Surgical History:  Procedure Laterality Date   COLONOSCOPY WITH PROPOFOL N/A 02/03/2015   Procedure: COLONOSCOPY WITH PROPOFOL;  Surgeon: Arta Silence, MD;  Location: [facility] END[facility]COPY;  Service: Endoscopy;  Laterality: N/A;   KNEE ARTHROSCOPY Left    ROTATOR CUFF REPAIR Right    SHOULDER ARTHROSCOPY Left      A IV Location/Drains/Wounds Patient Lines/Drains/Airways Status     Active Line/Drains/Airways     Name Placement date Placement time Site Days   Peripheral IV 12/24/20 20 G Left Arm 12/24/20  0711  Arm  less than 1            Intake/Output Last 24 hours  Intake/Output Summary (Last 24 hours) at 12/24/2020 1233 Last data filed at 12/24/2020 0909 Gross per 24 hour  Intake 19.67 ml  Output --  Net 19.67 ml    Labs/Imaging Results for orders placed or performed during the hospital encounter of 12/24/20 (from the past 48 hour(s))  Resp Panel by RT-PCR (Flu A&B, Covid) Nasopharyngeal Swab     Status: None   Collection Time: 12/24/20  8:56 AM   Specimen: Nasopharyngeal Swab; Nasopharyngeal(NP) swabs in vial transport medium  Result Value Ref Range   SARS Coronavirus 2 by RT PCR NEGATIVE NEGATIVE    Comment: (NOTE) SARS-CoV-2 target nucleic acids are NOT DETECTED.  The SARS-CoV-2 RNA is generally detectable in upper respiratory specimens during the acute phase of infection. The  lowest concentration of SARS-CoV-2 viral copies this assay can detect is 138 copies/mL. A negative result does not preclude SARS-Cov-2 infection and should not be used as the sole basis for treatment or other patient management decisions. A negative result may occur with  improper specimen collection/handling, submission of specimen other than nasopharyngeal swab, presence of viral mutation(s) within the areas targeted by this assay, and inadequate number of viral copies(<138 copies/mL). A negative result must be combined  with clinical observations, patient history, and epidemiological information. The expected result is Negative.  Fact Sheet for Patients:  EntrepreneurPulse.com.au  Fact Sheet for Healthcare Providers:  IncredibleEmployment.be  This test is no t yet approved or cleared by the [place] FDA and  has been authorized for detection and/or diagnosis of SARS-CoV-2 by FDA under an Emergency Use Authorization (EUA). This EUA will remain  in effect (meaning this test can be used) for the duration of the COVID-19 declaration under Section 564(b)(1) of the Act, 21 U.S.C.section 360bbb-3(b)(1), unless the authorization is terminated  or revoked sooner.       Influenza A by PCR NEGATIVE NEGATIVE   Influenza B by PCR NEGATIVE NEGATIVE    Comment: (NOTE) The Xpert Xpress SARS-CoV-2/FLU/RSV plus assay is intended as an aid in the diagnosis of influenza from Nasopharyngeal swab specimens and should not be used as a sole basis for treatment. Nasal washings and aspirates are unacceptable for Xpert Xpress SARS-CoV-2/FLU/RSV testing.  Fact Sheet for Patients: EntrepreneurPulse.com.au  Fact Sheet for Healthcare Providers: IncredibleEmployment.be  This test is not yet approved or cleared by the [place] FDA and has been authorized for detection and/or diagnosis of SARS-CoV-2 by FDA under an Emergency Use Authorization (EUA). This EUA will remain in effect (meaning this test can be used) for the duration of the COVID-19 declaration under Section 564(b)(1) of the Act, 21 U.S.C. section 360bbb-3(b)(1), unless the authorization is terminated or revoked.  Performed at [facility], [place] [address] [place], [place] [address]   Basic metabolic panel     Status: Abnormal   Collection Time: 12/24/20  9:40 AM  Result Value Ref Range   Sodium 143 135 - 145 mmol/L   Potassium 4.5 3.5 - 5.1 mmol/L   Chloride 113 (H) 98  - 111 mmol/L   CO2 21 (L) 22 - 32 mmol/L   Glucose, Bld 93 70 - 99 mg/dL    Comment: Glucose reference range applies only to samples taken after fasting for at least 8 hours.   BUN 23 8 - 23 mg/dL   Creatinine, Ser 2.19 (H) 0.61 - 1.24 mg/dL   Calcium 8.8 (L) 8.9 - 10.3 mg/dL   GFR, Estimated 29 (L) >60 mL/min    Comment: (NOTE) Calculated using the CKD-EPI Creatinine Equation (2021)    Anion gap 9 5 - 15    Comment: Performed at [place] [address] [place], [place] [address]  Magnesium     Status: None   Collection Time: 12/24/20  9:40 AM  Result Value Ref Range   Magnesium 2.1 1.7 - 2.4 mg/dL    Comment: Performed at [place] [address], [place] [address]  CBC     Status: Abnormal   Collection Time: 12/24/20  9:40 AM  Result Value Ref Range   WBC 9.2 4.0 - 10.5 K/uL   RBC 5.03 4.22 - 5.81 MIL/uL   Hemoglobin 11.4 (L) 13.0 - 17.0 g/dL   HCT 38.5 (L) 39.0 -  52.0 %   MCV 76.5 (L) 80.0 - 100.0 fL   MCH 22.7 (L) 26.0 - 34.0 pg   MCHC 29.6 (L) 30.0 - 36.0 g/dL   RDW 21.9 (H) 11.5 - 15.5 %   Platelets 273 150 - 400 K/uL   nRBC 0.0 0.0 - 0.2 %    Comment: Performed at [facility] [address] [place], [place] [address]  Brain natriuretic peptide     Status: Abnormal   Collection Time: 12/24/20  9:41 AM  Result Value Ref Range   B Natriuretic Peptide 1,084.1 (H) 0.0 - 100.0 pg/mL    Comment: Performed at [facility] [address] [place], [place] [address]   DG CHEST PORT 1 VIEW  Result Date: 12/24/2020 CLINICAL DATA:  Acute on chronic combined systolic and diastolic CHF. EXAM: PORTABLE CHEST 1 VIEW COMPARISON:  09/20/2020 FINDINGS: Single-view of the chest demonstrates densities at the right lung base which could be associated with volume loss or even pleural fluid. Heart is enlarged. Left lung is clear without overt pulmonary edema. Right shoulder replacement. IMPRESSION: 1. Right basilar chest densities. Findings could  represent volume loss and/or pleural fluid. 2. Cardiomegaly without pulmonary edema. Electronically Signed   By: Markus Daft M.D.   On: 12/24/2020 11:24    Pending Labs Unresulted Labs (From admission, onward)     Start     Ordered   12/25/20 5621  Basic metabolic panel  Tomorrow morning,   R        12/24/20 1015   12/25/20 0500  CBC  Tomorrow morning,   R        12/24/20 1015            Vitals/Pain Today's Vitals   12/24/20 1015 12/24/20 1100 12/24/20 1115 12/24/20 1145  BP: (!) 132/99 119/66    Pulse: (!) 123 (!) 121 (!) 121 (!) 120  Resp: (!) 23 (!) 22 (!) 25 (!) 27  Temp:      TempSrc:      SpO2: 97% 96% 98% 97%  Weight:      Height:      PainSc:        Isolation Precautions No active isolations  Medications Medications  furosemide (LASIX) injection 40 mg (40 mg Intravenous Given 12/24/20 0917)  apixaban (ELIQUIS) tablet 2.5 mg (has no administration in time range)  pantoprazole (PROTONIX) EC tablet 20 mg (20 mg Oral Given 12/24/20 0917)  allopurinol (ZYLOPRIM) tablet 100 mg (100 mg Oral Given 12/24/20 1127)  amLODipine (NORVASC) tablet 5 mg (has no administration in time range)  mometasone-formoterol (DULERA) 200-5 MCG/ACT inhaler 2 puff (2 puffs Inhalation Given 12/24/20 1128)  latanoprost (XALATAN) 0.005 % ophthalmic solution 1 drop (has no administration in time range)  acetaminophen (TYLENOL) tablet 650 mg (has no administration in time range)    Or  acetaminophen (TYLENOL) suppository 650 mg (has no administration in time range)  oxyCODONE (Oxy IR/ROXICODONE) immediate release tablet 5 mg (has no administration in time range)  morphine 2 MG/ML injection 2 mg (has no administration in time range)  docusate sodium (COLACE) capsule 100 mg (100 mg Oral Given 12/24/20 1127)  polyethylene glycol (MIRALAX / GLYCOLAX) packet 17 g (has no administration in time range)  bisacodyl (DULCOLAX) EC tablet 5 mg (has no administration in time range)  ondansetron (ZOFRAN)  tablet 4 mg (has no administration in time range)    Or  ondansetron (ZOFRAN) injection 4 mg (has no administration in time  range)  hydrALAZINE (APRESOLINE) injection 5 mg (has no administration in time range)  amiodarone (NEXTERONE) 1.8 mg/mL load via infusion 150 mg (150 mg Intravenous Not Given 12/24/20 1109)    Followed by  amiodarone (NEXTERONE PREMIX) 360-4.14 MG/200ML-% (1.8 mg/mL) IV infusion (60 mg/hr Intravenous New Bag/Given 12/24/20 1059)    Followed by  amiodarone (NEXTERONE PREMIX) 360-4.14 MG/200ML-% (1.8 mg/mL) IV infusion (has no administration in time range)    Mobility walks Low fall risk   Focused Assessments Cardiac Assessment Handoff:    Lab Results  Component Value Date   CKTOTAL 170 07/18/2018   Lab Results  Component Value Date   DDIMER 0.61 (H) 07/21/2018   Does the Patient currently have chest pain? No    R Recommendations: See Admitting Provider Note  Report given to:   Additional Notes:

## 2020-12-24 NOTE — Assessment & Plan Note (Signed)
-  Baseline stage 3b CKD -Will check BMP today and tomorrow and monitor during diuresis

## 2020-12-24 NOTE — Assessment & Plan Note (Signed)
-  Patient with known afib who presented today for cardioversion -He was in RVR with mild volume overload and so cardiology deferred procedure.  -Cardiology has started Amiodarone -Continue Eliquis -Management per cardiology

## 2020-12-24 NOTE — Evaluation (Signed)
Physical Therapy Evaluation Patient Details Name: Nicholas [name] MRN: [id number] DOB: 01-Sep-1933 Today's Date: 12/24/2020  History of Present Illness  Pt is an 87 y/o male admitted 12/16 for cardioversion, however, cardioversion was cancelled as the pt was volume overloaded. PMH includes HTN, CKD, CHF, and a fib.  Clinical Impression  Pt admitted secondary to problem above with deficits below. Pt requiring min to min guard A For mobility tasks. Increased SOB noted; oxygen sats WFL on RA. HR in low 120s throughout. Anticipate pt will progress well as symptoms improve. Reports family can assist as needed at d/c. Will continue to follow acutely.        Recommendations for follow up therapy are one component of a multi-disciplinary discharge planning process, led by the attending physician.  Recommendations may be updated based on patient status, additional functional criteria and insurance authorization.  Follow Up Recommendations Home health PT    Assistance Recommended at Discharge Intermittent Supervision/Assistance  Functional Status Assessment Patient has had a recent decline in their functional status and demonstrates the ability to make significant improvements in function in a reasonable and predictable amount of time.  Equipment Recommendations  Other (comment) (TBD)    Recommendations for Other Services       Precautions / Restrictions Precautions Precautions: Fall Restrictions Weight Bearing Restrictions: No      Mobility  Bed Mobility Overal bed mobility: Needs Assistance Bed Mobility: Supine to Sit;Sit to Supine     Supine to sit: Min assist Sit to supine: Supervision   General bed mobility comments: Min A for trunk elevation to come to sitting    Transfers Overall transfer level: Needs assistance Equipment used: 1 person hand held assist Transfers: Sit to/from Stand Sit to Stand: Min assist           General transfer comment: Required assist for  steadying to stand from stretcher    Ambulation/Gait Ambulation/Gait assistance: Min assist;Min guard Gait Distance (Feet): 10 Feet Assistive device: 1 person hand held assist Gait Pattern/deviations: Step-through pattern;Decreased stride length Gait velocity: Decreased     General Gait Details: Ambulated short distance in ED room. Min guard to min A For steadying. HR in low 120s throughout. DOE at 3/4.  Stairs            Wheelchair Mobility    Modified Rankin (Stroke Patients Only)       Balance Overall balance assessment: Needs assistance Sitting-balance support: No upper extremity supported;Feet supported Sitting balance-Leahy Scale: Good     Standing balance support: Single extremity supported Standing balance-Leahy Scale: Fair Standing balance comment: Able to maintain static standing without UE support                             Pertinent Vitals/Pain Pain Assessment: No/denies pain    Home Living Family/patient expects to be discharged to:: Private residence Living Arrangements: Other relatives (nephew) Available Help at Discharge: Family;Available 24 hours/day Type of Home: House Home Access: Ramped entrance       Home Layout: One level Home Equipment: None      Prior Function Prior Level of Function : Independent/Modified Independent                     Hand Dominance        Extremity/Trunk Assessment   Upper Extremity Assessment Upper Extremity Assessment: Defer to OT evaluation    Lower Extremity Assessment Lower Extremity Assessment: Generalized weakness  Cervical / Trunk Assessment Cervical / Trunk Assessment: Normal  Communication   Communication: HOH  Cognition Arousal/Alertness: Awake/alert Behavior During Therapy: WFL for tasks assessed/performed Overall Cognitive Status: Within Functional Limits for tasks assessed                                          General Comments General  comments (skin integrity, edema, etc.): Pt's daughter present throughout    Exercises     Assessment/Plan    PT Assessment Patient needs continued PT services  PT Problem List Decreased strength;Decreased balance;Decreased activity tolerance;Decreased mobility;Cardiopulmonary status limiting activity       PT Treatment Interventions Gait training;Stair training;Functional mobility training;Therapeutic activities;Therapeutic exercise;Balance training;Patient/family education    PT Goals (Current goals can be found in the Care Plan section)  Acute Rehab PT Goals Patient Stated Goal: to feel better PT Goal Formulation: With patient Time For Goal Achievement: 01/07/21 Potential to Achieve Goals: Good    Frequency Min 3X/week   Barriers to discharge        Co-evaluation               AM-PAC PT "6 Clicks" Mobility  Outcome Measure Help needed turning from your back to your side while in a flat bed without using bedrails?: A Little Help needed moving from lying on your back to sitting on the side of a flat bed without using bedrails?: A Little Help needed moving to and from a bed to a chair (including a wheelchair)?: A Little Help needed standing up from a chair using your arms (e.g., wheelchair or bedside chair)?: A Little Help needed to walk in hospital room?: A Little Help needed climbing 3-5 steps with a railing? : A Little 6 Click Score: 18    End of Session Equipment Utilized During Treatment: Gait belt Activity Tolerance: Patient limited by fatigue Patient left: in bed;with call bell/phone within reach (on stretcher in ED) Nurse Communication: Mobility status PT Visit Diagnosis: Other abnormalities of gait and mobility (R26.89);Muscle weakness (generalized) (M62.81)    Time: 9480-1655 PT Time Calculation (min) (ACUTE ONLY): 16 min   Charges:   PT Evaluation $PT Eval Moderate Complexity: 1 Mod          Reuel Derby, PT, DPT  Acute Rehabilitation Services   Pager: [phone] Office: [phone]   Rudean Hitt 12/24/2020, 1:44 PM

## 2020-12-25 DIAGNOSIS — H409 Unspecified glaucoma: Secondary | ICD-10-CM | POA: Diagnosis present

## 2020-12-25 DIAGNOSIS — M1A9XX0 Chronic gout, unspecified, without tophus (tophi): Secondary | ICD-10-CM | POA: Diagnosis present

## 2020-12-25 DIAGNOSIS — E1122 Type 2 diabetes mellitus with diabetic chronic kidney disease: Secondary | ICD-10-CM | POA: Diagnosis not present

## 2020-12-25 DIAGNOSIS — Z79899 Other long term (current) drug therapy: Secondary | ICD-10-CM | POA: Diagnosis not present

## 2020-12-25 DIAGNOSIS — E440 Moderate protein-calorie malnutrition: Secondary | ICD-10-CM | POA: Diagnosis not present

## 2020-12-25 DIAGNOSIS — I4519 Other right bundle-branch block: Secondary | ICD-10-CM | POA: Diagnosis present

## 2020-12-25 DIAGNOSIS — I5043 Acute on chronic combined systolic (congestive) and diastolic (congestive) heart failure: Secondary | ICD-10-CM | POA: Diagnosis not present

## 2020-12-25 DIAGNOSIS — N1832 Chronic kidney disease, stage 3b: Secondary | ICD-10-CM | POA: Diagnosis present

## 2020-12-25 DIAGNOSIS — Z6831 Body mass index (BMI) 31.0-31.9, adult: Secondary | ICD-10-CM | POA: Diagnosis not present

## 2020-12-25 DIAGNOSIS — Z8616 Personal history of COVID-19: Secondary | ICD-10-CM | POA: Diagnosis not present

## 2020-12-25 DIAGNOSIS — I4891 Unspecified atrial fibrillation: Secondary | ICD-10-CM | POA: Diagnosis not present

## 2020-12-25 DIAGNOSIS — Z7951 Long term (current) use of inhaled steroids: Secondary | ICD-10-CM | POA: Diagnosis not present

## 2020-12-25 DIAGNOSIS — N179 Acute kidney failure, unspecified: Secondary | ICD-10-CM | POA: Diagnosis not present

## 2020-12-25 DIAGNOSIS — Z20822 Contact with and (suspected) exposure to covid-19: Secondary | ICD-10-CM | POA: Diagnosis not present

## 2020-12-25 DIAGNOSIS — J449 Chronic obstructive pulmonary disease, unspecified: Secondary | ICD-10-CM | POA: Diagnosis present

## 2020-12-25 DIAGNOSIS — D509 Iron deficiency anemia, unspecified: Secondary | ICD-10-CM | POA: Diagnosis not present

## 2020-12-25 DIAGNOSIS — I4892 Unspecified atrial flutter: Secondary | ICD-10-CM | POA: Diagnosis present

## 2020-12-25 DIAGNOSIS — I484 Atypical atrial flutter: Secondary | ICD-10-CM | POA: Diagnosis not present

## 2020-12-25 DIAGNOSIS — M1990 Unspecified osteoarthritis, unspecified site: Secondary | ICD-10-CM | POA: Diagnosis present

## 2020-12-25 DIAGNOSIS — H9193 Unspecified hearing loss, bilateral: Secondary | ICD-10-CM | POA: Diagnosis present

## 2020-12-25 DIAGNOSIS — E785 Hyperlipidemia, unspecified: Secondary | ICD-10-CM | POA: Diagnosis not present

## 2020-12-25 DIAGNOSIS — M79672 Pain in left foot: Secondary | ICD-10-CM | POA: Diagnosis not present

## 2020-12-25 DIAGNOSIS — Z8249 Family history of ischemic heart disease and other diseases of the circulatory system: Secondary | ICD-10-CM | POA: Diagnosis not present

## 2020-12-25 DIAGNOSIS — I130 Hypertensive heart and chronic kidney disease with heart failure and stage 1 through stage 4 chronic kidney disease, or unspecified chronic kidney disease: Secondary | ICD-10-CM | POA: Diagnosis not present

## 2020-12-25 DIAGNOSIS — Z7901 Long term (current) use of anticoagulants: Secondary | ICD-10-CM | POA: Diagnosis not present

## 2020-12-25 DIAGNOSIS — E663 Overweight: Secondary | ICD-10-CM | POA: Diagnosis present

## 2020-12-25 DIAGNOSIS — I5023 Acute on chronic systolic (congestive) heart failure: Secondary | ICD-10-CM | POA: Diagnosis not present

## 2020-12-25 LAB — CBC
HCT: 38.7 % — ABNORMAL LOW (ref 39.0–52.0)
Hemoglobin: 11.2 g/dL — ABNORMAL LOW (ref 13.0–17.0)
MCH: 21.9 pg — ABNORMAL LOW (ref 26.0–34.0)
MCHC: 28.9 g/dL — ABNORMAL LOW (ref 30.0–36.0)
MCV: 75.6 fL — ABNORMAL LOW (ref 80.0–100.0)
Platelets: 280 10*3/uL (ref 150–400)
RBC: 5.12 MIL/uL (ref 4.22–5.81)
RDW: 21.8 % — ABNORMAL HIGH (ref 11.5–15.5)
WBC: 8.1 10*3/uL (ref 4.0–10.5)
nRBC: 0 % (ref 0.0–0.2)

## 2020-12-25 LAB — BASIC METABOLIC PANEL
Anion gap: 9 (ref 5–15)
BUN: 26 mg/dL — ABNORMAL HIGH (ref 8–23)
CO2: 25 mmol/L (ref 22–32)
Calcium: 8.9 mg/dL (ref 8.9–10.3)
Chloride: 107 mmol/L (ref 98–111)
Creatinine, Ser: 2.45 mg/dL — ABNORMAL HIGH (ref 0.61–1.24)
GFR, Estimated: 25 mL/min — ABNORMAL LOW (ref >60)
Glucose, Bld: 96 mg/dL (ref 70–99)
Potassium: 4.1 mmol/L (ref 3.5–5.1)
Sodium: 141 mmol/L (ref 135–145)

## 2020-12-25 MED ORDER — FUROSEMIDE 10 MG/ML IJ SOLN
40.0000 mg | Freq: Once | INTRAMUSCULAR | Status: AC
  Administered 2020-12-25: 40 mg via INTRAVENOUS

## 2020-12-25 MED ORDER — FUROSEMIDE 10 MG/ML IJ SOLN
80.0000 mg | Freq: Two times a day (BID) | INTRAMUSCULAR | Status: DC
Start: 2020-12-25 — End: 2020-12-26
  Administered 2020-12-25 – 2020-12-26 (×2): 80 mg via INTRAVENOUS
  Filled 2020-12-25 (×2): qty 8

## 2020-12-25 NOTE — Progress Notes (Signed)
At 11:25 pm complains of shortness of breath. Called respiratory therapist and assessed the patient. Lung sound is clear and no wheezing. Placed patient on 3 Liters of O2. MD made aware.

## 2020-12-25 NOTE — TOC Initial Note (Signed)
Transition of Care [facility]) - Initial/Assessment Note    Patient Details  Name: Nicholas [name] MRN: [id number] Date of Birth: December 02, 1933  [facility] of [facility] CM/SW Contact:    Carles Collet, RN Phone Number: 12/25/2020, 1:08 PM  Clinical Narrative:             Damaris Schooner w patient and family at bedside. Patient is from home, lives w his grandson [name]. Richard states that PTA patient was independent in the home, no ambulatory DME  was needed. Richard expects that with some rest and his HR under control his activity tolerance will improve and patient will not need HH. He declines HH at this time.       Expected Discharge Plan: Home/Self Care Barriers to Discharge: Continued Medical Work up   Patient Goals and CMS Choice Patient states their goals for this hospitalization and ongoing recovery are:: return home CMS Medicare.gov Compare Post Acute Care list provided to:: Other (Comment Required) Choice offered to / list presented to : Adult Children  Expected Discharge Plan and Services Expected Discharge Plan: Home/Self Care   Discharge Planning Services: CM Consult                                          Prior Living Arrangements/Services   Lives with:: Adult Children                   Activities of Daily Living Home Assistive Devices/Equipment: None ADL Screening (condition at time of admission) Patient's cognitive ability adequate to safely complete daily activities?: Yes Is the patient deaf or have difficulty hearing?: Yes Does the patient have difficulty seeing, even when wearing glasses/contacts?: No Does the patient have difficulty concentrating, remembering, or making decisions?: No Patient able to express need for assistance with ADLs?: Yes Does the patient have difficulty dressing or bathing?: No Independently performs ADLs?: Yes (appropriate for developmental age) Does the patient have difficulty walking or climbing stairs?: No Weakness of Legs:  None Weakness of Arms/Hands: None  Permission Sought/Granted                  Emotional Assessment              Admission diagnosis:  Atypical atrial flutter ([place]) [I48.4] Acute on chronic combined systolic and diastolic CHF (congestive heart failure) ([place]) [I50.43] Atrial fibrillation, unspecified type ([place]) [I48.91] Acute on chronic congestive heart failure, unspecified heart failure type ([place]) [I50.9] Atrial flutter with rapid ventricular response ([place]) [I48.92] Patient Active Problem List   Diagnosis Date Noted   Malnutrition of moderate degree 12/25/2020   Atrial flutter with rapid ventricular response ([place]) 12/25/2020   Acute on chronic combined systolic and diastolic CHF (congestive heart failure) ([place]) 12/24/2020   Atrial fibrillation with RVR ([place]) 12/24/2020   Iron deficiency anemia 11/04/2020   Anemia 09/22/2020   Arthritis 09/22/2020   Asthma 09/22/2020   Family history of adverse reaction to anesthesia 09/22/2020   Glaucoma 09/22/2020   Pneumonia due to COVID-19 virus 07/19/2018   COVID-19 virus infection 07/18/2018   Stage 3b chronic kidney disease (CKD) ([place]) 11/27/2017   Hyperlipidemia 11/02/2017   TIA (transient ischemic attack) 11/01/2017   CHF (congestive heart failure) ([place]) 11/01/2017   HTN (hypertension) 11/01/2017   PCP:  Street, [name], MD Pharmacy:   [place] [address] [place]  [facility] [place] [address] Phone: [phone] Fax: [phone]     Social Determinants of Health (SDOH) Interventions Food Insecurity Interventions: Intervention Not Indicated Financial Strain Interventions: Intervention Not Indicated Housing Interventions: Intervention Not Indicated Transportation Interventions: Intervention Not Indicated  Readmission Risk Interventions No flowsheet data found.

## 2020-12-25 NOTE — Progress Notes (Signed)
PROGRESS NOTE    Nicholas [name]  GYI:[id number] DOB: 07-Oct-1933 DOA: 12/24/2020 PCP: [name], [name], MD   Chief Complaint  Patient presents with   Shortness of Breath  Brief Narrative/Hospital Course: Nicholas [name], 87 y.o. male with PMH of  HTN; stage 3b CKD; chronic systolic CHF EF 35-00% previously 55-60%; atrial flutter last seen by Dr. Traci Sermon on 12/7 noted to be a flutter with RVR and plan for DCCV 12/16 but noted to be overloaded with fluid on exam with elevated JVD heart evaluated and progressive shortness of breath and left more than right lower extremity edema, weight of 210 lb previously 194 lb.  Patient was seen in Endo for cardioversion and Dr. Johney Frame requested [facility] admission for volume overload.   Patient was a flutter with RVR in 120s, placed on IV Lasix, amiodarone infusion and admitted, cardiology was consulted   Subjective: Seen and examined this morning.  Grandson at the bedside Overnight afebrile heart rate in 110s blood pressure stable On IV amiodarone and IV Lasix. Shortness of breath much improved, has mild audible wheezing, has lower leg edema left side overall much improved. Is on room air.  Assessment & Plan:  Acute on chronic systolic CHF: BNP in 9381, was in fluid overload with JVD shortness of breath and cardioversion deferred.  Continue IV Lasix monitor intake output, daily weight, continue low-salt diet, plan per cardiology-for GDMT once renal function stable Net IO Since Admission: -2,440.52 mL [12/25/20 0831]  Filed Weights   12/24/20 0659 12/24/20 0905 12/25/20 0000  Weight: 95.3 kg 95.3 kg 93.7 kg     A flutter with RVR: Continue amiodarone drip, DOAC-Home Eliquis, monitor closely may need cardioversion once euvolemic, will defer to cardiology  HTN: Blood pressure well controlled on amlodipine.  Stage 3b chronic kidney disease with mild AKI: Creatinine was seen 3 in October baseline 1.7-1.9, slightly uptrending -await for cardiology plan  for IV Lasix held this morning by nursing staff, monitor closely while on diuresis. Recent Labs  Lab 12/24/20 0940 12/25/20 0209  BUN 23 26*  CREATININE 2.19* 2.45*    COPD continue Dulera, bronchodilators actively wheezing on admission Glaucoma: Continue eyedrops Malnutrition of moderate degree: Augment diet as tolerated Overweight with BMI 29: Outpatient PC follow-up and weight loss DVT prophylaxis: apixaban (ELIQUIS) tablet 2.5 mg Start: 12/24/20 2200 Code Status:   Code Status: Full Code Family Communication: plan of care discussed with patient at bedside. Status is: Admitted as observation Patient remains hospitalized for ongoing IV diuresis rate control with IV meds and Disposition: Currently not medically stable for discharge. Anticipated Disposition: Home  Objective: Vitals last 24 hrs: Vitals:   12/24/20 2200 12/24/20 2300 12/25/20 0000 12/25/20 0506  BP:  121/79 (!) 117/94 (!) 115/91  Pulse: (!) 118 (!) 118 (!) 116 (!) 115  Resp: (!) 23 13 19 16   Temp:   (!) 97.5 F (36.4 C) (!) 97.5 F (36.4 C)  TempSrc:   Oral Oral  SpO2: 96% 95% 97% 97%  Weight:   93.7 kg   Height:       Weight change: -0.045 kg  Intake/Output Summary (Last 24 hours) at 12/25/2020 0826 Last data filed at 12/25/2020 8299 Gross per 24 hour  Intake 609.48 ml  Output 3050 ml  Net -2440.52 ml   Net IO Since Admission: -2,440.52 mL [12/25/20 0826]   Physical Examination: General exam: AA0x3, weak,older than stated age. HEENT:Oral mucosa moist, Ear/Nose WNL grossly,dentition normal. Respiratory system: B/l diminished with wheezing , no  use of accessory muscle, non tender. Cardiovascular system: S1 & S2 +,No JVD. Gastrointestinal system: Abdomen soft, NT,ND, BS+. Nervous System:Alert, awake, moving extremities. Extremities: edema LLE > RLE, distal peripheral pulses palpable.  Skin: No rashes, no icterus. MSK: Normal muscle bulk, tone, power.  Medications reviewed:  Scheduled Meds:   allopurinol  100 mg Oral Daily   amiodarone  150 mg Intravenous Once   amLODipine  5 mg Oral Daily   apixaban  2.5 mg Oral BID   docusate sodium  100 mg Oral BID   feeding supplement  237 mL Oral BID BM   furosemide  40 mg Intravenous BID   latanoprost  1 drop Both Eyes QHS   mometasone-formoterol  2 puff Inhalation BID   multivitamin with minerals  1 tablet Oral Daily   pantoprazole  20 mg Oral Daily   Continuous Infusions:  amiodarone 30 mg/hr (12/25/20 0219)   Diet Order             Diet Heart Room service appropriate? Yes; Fluid consistency: Thin; Fluid restriction: 1500 mL Fluid  Diet effective now                   Nutrition Problem: Moderate Malnutrition Etiology: chronic illness (CHF) Signs/Symptoms: moderate fat depletion, moderate muscle depletion Interventions: Ensure Enlive (each supplement provides 350kcal and 20 grams of protein), MVI  Weight change: -0.045 kg  Wt Readings from Last 3 Encounters:  12/25/20 93.7 kg  12/15/20 95.3 kg  11/25/20 94.3 kg     Consultants:see note  Procedures:see note Antimicrobials: Anti-infectives (From admission, onward)    None      Culture/Microbiology    Component Value Date/Time   SDES URINE, RANDOM 07/18/2018 1145   SPECREQUEST NONE 07/18/2018 1145   CULT  07/18/2018 1145    NO GROWTH Performed at [facility], [place] [address], [place], [place] [address]    REPTSTATUS 07/19/2018 FINAL 07/18/2018 1145    Other culture-see note  Unresulted Labs (From admission, onward)    None     Data Reviewed: I have personally reviewed following labs and imaging studies CBC: Recent Labs  Lab 12/24/20 0940 12/25/20 0209  WBC 9.2 8.1  HGB 11.4* 11.2*  HCT 38.5* 38.7*  MCV 76.5* 75.6*  PLT 273 270   Basic Metabolic Panel: Recent Labs  Lab 12/24/20 0940 12/25/20 0209  NA 143 141  K 4.5 4.1  CL 113* 107  CO2 21* 25  GLUCOSE 93 96  BUN 23 26*  CREATININE 2.19* 2.45*  CALCIUM 8.8* 8.9  MG 2.1  --     GFR: Estimated Creatinine Clearance: 25.3 mL/min (A) (by C-G formula based on SCr of 2.45 mg/dL (H)). Liver Function Tests: No results for input(s): AST, ALT, ALKPHOS, BILITOT, PROT, ALBUMIN in the last 168 hours. No results for input(s): LIPASE, AMYLASE in the last 168 hours. No results for input(s): AMMONIA in the last 168 hours. Coagulation Profile: No results for input(s): INR, PROTIME in the last 168 hours. Cardiac Enzymes: No results for input(s): CKTOTAL, CKMB, CKMBINDEX, TROPONINI in the last 168 hours. BNP (last 3 results) Recent Labs    10/15/20 0841 10/27/20 0936 12/15/20 1127  PROBNP 5,768* 6,081* 18,617*   HbA1C: No results for input(s): HGBA1C in the last 72 hours. CBG: No results for input(s): GLUCAP in the last 168 hours. Lipid Profile: No results for input(s): CHOL, HDL, LDLCALC, TRIG, CHOLHDL, LDLDIRECT in the last 72 hours. Thyroid Function Tests: No results for input(s):  TSH, T4TOTAL, FREET4, T3FREE, THYROIDAB in the last 72 hours. Anemia Panel: No results for input(s): VITAMINB12, FOLATE, FERRITIN, TIBC, IRON, RETICCTPCT in the last 72 hours. Sepsis Labs: No results for input(s): PROCALCITON, LATICACIDVEN in the last 168 hours.  Recent Results (from the past 240 hour(s))  Resp Panel by RT-PCR (Flu A&B, Covid) Nasopharyngeal Swab     Status: None   Collection Time: 12/24/20  8:56 AM   Specimen: Nasopharyngeal Swab; Nasopharyngeal(NP) swabs in vial transport medium  Result Value Ref Range Status   SARS Coronavirus 2 by RT PCR NEGATIVE NEGATIVE Final    Comment: (NOTE) SARS-CoV-2 target nucleic acids are NOT DETECTED.  The SARS-CoV-2 RNA is generally detectable in upper respiratory specimens during the acute phase of infection. The lowest concentration of SARS-CoV-2 viral copies this assay can detect is 138 copies/mL. A negative result does not preclude SARS-Cov-2 infection and should not be used as the sole basis for treatment or other patient  management decisions. A negative result may occur with  improper specimen collection/handling, submission of specimen other than nasopharyngeal swab, presence of viral mutation(s) within the areas targeted by this assay, and inadequate number of viral copies(<138 copies/mL). A negative result must be combined with clinical observations, patient history, and epidemiological information. The expected result is Negative.  Fact Sheet for Patients:  EntrepreneurPulse.com.au  Fact Sheet for Healthcare Providers:  IncredibleEmployment.be  This test is no t yet approved or cleared by the [place] FDA and  has been authorized for detection and/or diagnosis of SARS-CoV-2 by FDA under an Emergency Use Authorization (EUA). This EUA will remain  in effect (meaning this test can be used) for the duration of the COVID-19 declaration under Section 564(b)(1) of the Act, 21 U.S.C.section 360bbb-3(b)(1), unless the authorization is terminated  or revoked sooner.       Influenza A by PCR NEGATIVE NEGATIVE Final   Influenza B by PCR NEGATIVE NEGATIVE Final    Comment: (NOTE) The Xpert Xpress SARS-CoV-2/FLU/RSV plus assay is intended as an aid in the diagnosis of influenza from Nasopharyngeal swab specimens and should not be used as a sole basis for treatment. Nasal washings and aspirates are unacceptable for Xpert Xpress SARS-CoV-2/FLU/RSV testing.  Fact Sheet for Patients: EntrepreneurPulse.com.au  Fact Sheet for Healthcare Providers: IncredibleEmployment.be  This test is not yet approved or cleared by the [place] FDA and has been authorized for detection and/or diagnosis of SARS-CoV-2 by FDA under an Emergency Use Authorization (EUA). This EUA will remain in effect (meaning this test can be used) for the duration of the COVID-19 declaration under Section 564(b)(1) of the Act, 21 U.S.C. section 360bbb-3(b)(1),  unless the authorization is terminated or revoked.  Performed at [facility], [place] [address], [place], [place] [address]      Radiology Studies: DG CHEST PORT 1 VIEW  Result Date: 12/24/2020 CLINICAL DATA:  Acute on chronic combined systolic and diastolic CHF. EXAM: PORTABLE CHEST 1 VIEW COMPARISON:  09/20/2020 FINDINGS: Single-view of the chest demonstrates densities at the right lung base which could be associated with volume loss or even pleural fluid. Heart is enlarged. Left lung is clear without overt pulmonary edema. Right shoulder replacement. IMPRESSION: 1. Right basilar chest densities. Findings could represent volume loss and/or pleural fluid. 2. Cardiomegaly without pulmonary edema. Electronically Signed   By: Markus Daft M.D.   On: 12/24/2020 11:24     LOS: 0 days   Antonieta Pert, MD [facility]  12/25/2020, 8:26 AM

## 2020-12-25 NOTE — Progress Notes (Signed)
Progress Note  Patient Name: Nicholas [name] Date of Encounter: 12/25/2020  Primary Cardiologist: None   Subjective   Worsening SOB.  Lasix have been held related to AKI.  With this worsening orthopnea.  Inpatient Medications    Scheduled Meds:  allopurinol  100 mg Oral Daily   amiodarone  150 mg Intravenous Once   amLODipine  5 mg Oral Daily   apixaban  2.5 mg Oral BID   docusate sodium  100 mg Oral BID   feeding supplement  237 mL Oral BID BM   furosemide  40 mg Intravenous BID   latanoprost  1 drop Both Eyes QHS   mometasone-formoterol  2 puff Inhalation BID   multivitamin with minerals  1 tablet Oral Daily   pantoprazole  20 mg Oral Daily   Continuous Infusions:  amiodarone 30 mg/hr (12/25/20 0219)   PRN Meds: acetaminophen **OR** acetaminophen, bisacodyl, hydrALAZINE, morphine injection, ondansetron **OR** ondansetron (ZOFRAN) IV, oxyCODONE, polyethylene glycol   Vital Signs    Vitals:   12/24/20 2300 12/25/20 0000 12/25/20 0506 12/25/20 0838  BP: 121/79 (!) 117/94 (!) 115/91 106/78  Pulse: (!) 118 (!) 116 (!) 115 (!) 116  Resp: 13 19 16 18   Temp:  (!) 97.5 F (36.4 C) (!) 97.5 F (36.4 C) 97.6 F (36.4 C)  TempSrc:  Oral Oral Oral  SpO2: 95% 97% 97% 92%  Weight:  93.7 kg    Height:        Intake/Output Summary (Last 24 hours) at 12/25/2020 1156 Last data filed at 12/25/2020 1038 Gross per 24 hour  Intake 589.81 ml  Output 3700 ml  Net -3110.19 ml   Filed Weights   12/24/20 0659 12/24/20 0905 12/25/20 0000  Weight: 95.3 kg 95.3 kg 93.7 kg    Telemetry    AFL RVR rates 116 - Personally Reviewed  ECG    No new - Personally Reviewed  Physical Exam   Gen: Mild distress, elderly male Neck: JVD to mid neck, no carotid bruit Cardiac: No Rubs or Gallops, IRIR +2 radial pulses Respiratory: Coarse rales bilaterally GI: Soft, nontender, non-distended  MS: bilateral pitting edema;  moves all extremities Integument: Skin feels warm Neuro:  At  time of evaluation, alert and oriented to person/place/time/situation  Psych: Normal affect, patient feels poorly   Labs    Chemistry Recent Labs  Lab 12/24/20 0940 12/25/20 0209  NA 143 141  K 4.5 4.1  CL 113* 107  CO2 21* 25  GLUCOSE 93 96  BUN 23 26*  CREATININE 2.19* 2.45*  CALCIUM 8.8* 8.9  GFRNONAA 29* 25*  ANIONGAP 9 9     Hematology Recent Labs  Lab 12/24/20 0940 12/25/20 0209  WBC 9.2 8.1  RBC 5.03 5.12  HGB 11.4* 11.2*  HCT 38.5* 38.7*  MCV 76.5* 75.6*  MCH 22.7* 21.9*  MCHC 29.6* 28.9*  RDW 21.9* 21.8*  PLT 273 280    Cardiac EnzymesNo results for input(s): TROPONINI in the last 168 hours. No results for input(s): TROPIPOC in the last 168 hours.   BNP Recent Labs  Lab 12/24/20 0941  BNP 1,084.1*     DDimer No results for input(s): DDIMER in the last 168 hours.   Radiology    DG CHEST PORT 1 VIEW  Result Date: 12/24/2020 CLINICAL DATA:  Acute on chronic combined systolic and diastolic CHF. EXAM: PORTABLE CHEST 1 VIEW COMPARISON:  09/20/2020 FINDINGS: Single-view of the chest demonstrates densities at the right lung base which could be associated  with volume loss or even pleural fluid. Heart is enlarged. Left lung is clear without overt pulmonary edema. Right shoulder replacement. IMPRESSION: 1. Right basilar chest densities. Findings could represent volume loss and/or pleural fluid. 2. Cardiomegaly without pulmonary edema. Electronically Signed   By: Markus Daft M.D.   On: 12/24/2020 11:24    Patient Profile     87 y.o. male with decompendated HF, AKI and AFL  Assessment & Plan    AFL RVR CHADSVASC 5 HTN AKI on CKD Stage IIIb COPD HFrEF (40-45%) in the setting of new AFL RVR - AKI continues to worse; despite this patient appears volume overloaded - Will unfortunately escalate his Diuretics; we have discussed the risks of this with family (80 IV) - continue Mearl Latin and Farxiga - if worsening 12/26/20 may consider IV milrinone -  consulted AHF  - eliquis today; would transition to heparin 12/26/20 if need RHC - on amiodarone bolus and drip with persistent tachycadia - will tolerate higher heart rates given HF; with AKI would not start dig  For questions or updates, please contact [facility] Please consult [email or web address] for contact info under Cardiology/STEMI.      Signed, Werner Lean, MD  12/25/2020, 11:56 AM

## 2020-12-26 LAB — HEPATIC FUNCTION PANEL
ALT: 19 U/L (ref 0–44)
AST: 28 U/L (ref 15–41)
Albumin: 3 g/dL — ABNORMAL LOW (ref 3.5–5.0)
Alkaline Phosphatase: 92 U/L (ref 38–126)
Bilirubin, Direct: 0.1 mg/dL (ref 0.0–0.2)
Indirect Bilirubin: 0.6 mg/dL (ref 0.3–0.9)
Total Bilirubin: 0.7 mg/dL (ref 0.3–1.2)
Total Protein: 5.4 g/dL — ABNORMAL LOW (ref 6.5–8.1)

## 2020-12-26 LAB — CBC
HCT: 38.6 % — ABNORMAL LOW (ref 39.0–52.0)
Hemoglobin: 11.7 g/dL — ABNORMAL LOW (ref 13.0–17.0)
MCH: 22.2 pg — ABNORMAL LOW (ref 26.0–34.0)
MCHC: 30.3 g/dL (ref 30.0–36.0)
MCV: 73.2 fL — ABNORMAL LOW (ref 80.0–100.0)
Platelets: 308 10*3/uL (ref 150–400)
RBC: 5.27 MIL/uL (ref 4.22–5.81)
RDW: 21.5 % — ABNORMAL HIGH (ref 11.5–15.5)
WBC: 10.8 10*3/uL — ABNORMAL HIGH (ref 4.0–10.5)
nRBC: 0 % (ref 0.0–0.2)

## 2020-12-26 LAB — BASIC METABOLIC PANEL
Anion gap: 11 (ref 5–15)
BUN: 29 mg/dL — ABNORMAL HIGH (ref 8–23)
CO2: 24 mmol/L (ref 22–32)
Calcium: 8.7 mg/dL — ABNORMAL LOW (ref 8.9–10.3)
Chloride: 102 mmol/L (ref 98–111)
Creatinine, Ser: 2.31 mg/dL — ABNORMAL HIGH (ref 0.61–1.24)
GFR, Estimated: 27 mL/min — ABNORMAL LOW (ref >60)
Glucose, Bld: 97 mg/dL (ref 70–99)
Potassium: 3.8 mmol/L (ref 3.5–5.1)
Sodium: 137 mmol/L (ref 135–145)

## 2020-12-26 LAB — LACTIC ACID, PLASMA: Lactic Acid, Venous: 1 mmol/L (ref 0.5–1.9)

## 2020-12-26 LAB — BRAIN NATRIURETIC PEPTIDE: B Natriuretic Peptide: 1213.1 pg/mL — ABNORMAL HIGH (ref 0.0–100.0)

## 2020-12-26 MED ORDER — FUROSEMIDE 10 MG/ML IJ SOLN
80.0000 mg | Freq: Two times a day (BID) | INTRAMUSCULAR | Status: AC
  Administered 2020-12-26 – 2020-12-27 (×2): 80 mg via INTRAVENOUS
  Filled 2020-12-26 (×2): qty 8

## 2020-12-26 NOTE — Progress Notes (Signed)
PROGRESS NOTE    Nicholas [name]  RXV:[id number] DOB: 29-Aug-1933 DOA: 12/24/2020 PCP: [name], [name], MD   Chief Complaint  Patient presents with   Shortness of Breath  Brief Narrative/Hospital Course: Nicholas [name], 87 y.o. male with PMH of  HTN; stage 3b CKD; chronic systolic CHF EF 50-93% previously 55-60%; atrial flutter last seen by Dr. Traci Sermon on 12/7 noted to be a flutter with RVR and plan for DCCV 12/16 but noted to be overloaded with fluid on exam with elevated JVD heart evaluated and progressive shortness of breath and left more than right lower extremity edema, weight of 210 lb previously 194 lb.  Patient was seen in Endo for cardioversion and Dr. Johney Frame requested [facility] admission for volume overload.   Patient was a flutter with RVR in 120s, placed on IV Lasix, amiodarone infusion and admitted, cardiology was consulted   Subjective: Seen and examined this morning, he feels much improved.  Not wheezing. Overnight afebrile heart rate 110s Creatinine slightly better, BNP up 2671>2458 Still appears hypervolemic Had good urine output 3850 ml, weight down at 200 pounds from 206, on admit 210 lb.  Assessment & Plan:  Acute on chronic systolic CHFIn the setting of a flutter RVR: Appears fluid overloaded currently with elevated BNP, edematous legs, continue to optimize heart rate with IV amiodarone.continue IV Lasix creatinine seems to be better today, appreciate cardiology input on board overall following closely.  Continue to monitor intake output, daily weight,  cont low-salt diet.GDMT-Entresto Aldactone Wilder Glade not started 2/2 AKI0- defer to  cardiology Net IO Since Admission: -6,780.58 mL [12/26/20 0915]  Filed Weights   12/25/20 0000 12/26/20 0000 12/26/20 0500  Weight: 93.7 kg 91 kg 91 kg     A flutter with RVR: Continue amiodarone drip, DOAC-Home Eliquis, monitor closely may need cardioversion once euvolemic, will defer to cardiology  HTN: Blood pressure well  controlled on amlodipine, diuresis.  CKD stage IIIb w/ mild AKI: Creatinine was in 3s in October baseline 1.7-1.9, up to 2.4, now improving creat , cont iv lasix. Monitor bmp daily. Recent Labs  Lab 12/24/20 0940 12/25/20 0209 12/26/20 0404  BUN 23 26* 29*  CREATININE 2.19* 2.45* 2.31*  COPD:continue Dulera, bronchodilators actively wheezing on admission Glaucoma: Continue eyedrops Malnutrition of moderate degree: Augment diet as tolerated Overweight with BMI 29: Outpatient PC follow-up and weight loss  DVT prophylaxis: apixaban (ELIQUIS) tablet 2.5 mg Start: 12/24/20 2200 Code Status:   Code Status: Full Code Family Communication: plan of care discussed with patient at bedside. Status is: Inpatient Patient remains hospitalized for ongoing IV diuresis rate control with IV meds and Disposition: Currently not medically stable for discharge. Anticipated Disposition: Home  Objective: Vitals last 24 hrs: Vitals:   12/26/20 0500 12/26/20 0709 12/26/20 0800 12/26/20 0909  BP:   125/78   Pulse:  86    Resp:      Temp:      TempSrc:      SpO2:    96%  Weight: 91 kg     Height:       Weight change: -4.264 kg  Intake/Output Summary (Last 24 hours) at 12/26/2020 0915 Last data filed at 12/26/2020 0850 Gross per 24 hour  Intake 1009.94 ml  Output 5350 ml  Net -4340.06 ml   Net IO Since Admission: -6,780.58 mL [12/26/20 0915]   Physical Examination: General exam: AAOx 3, elderly, not in distress HEENT:Oral mucosa moist, Ear/Nose WNL grossly, dentition normal. Respiratory system: bilaterally diminished breath sounds no  wheezing, no use of accessory muscle Cardiovascular system: S1 & S2 +, No JVD,. Gastrointestinal system: Abdomen soft,NT,ND, BS+ Nervous System:Alert, awake, moving extremities and grossly nonfocal Extremities: Bilateral lower ankle edema,distal peripheral pulses palpable.  Skin: No rashes,no icterus. MSK: Normal muscle bulk,tone, power   Medications  reviewed:  Scheduled Meds:  allopurinol  100 mg Oral Daily   amiodarone  150 mg Intravenous Once   amLODipine  5 mg Oral Daily   apixaban  2.5 mg Oral BID   docusate sodium  100 mg Oral BID   feeding supplement  237 mL Oral BID BM   furosemide  80 mg Intravenous BID   latanoprost  1 drop Both Eyes QHS   mometasone-formoterol  2 puff Inhalation BID   multivitamin with minerals  1 tablet Oral Daily   pantoprazole  20 mg Oral Daily   Continuous Infusions:  amiodarone 30 mg/hr (12/26/20 0335)   Diet Order             Diet Heart Room service appropriate? Yes; Fluid consistency: Thin; Fluid restriction: 1500 mL Fluid  Diet effective now                   Nutrition Problem: Moderate Malnutrition Etiology: chronic illness (CHF) Signs/Symptoms: moderate fat depletion, moderate muscle depletion Interventions: Ensure Enlive (each supplement provides 350kcal and 20 grams of protein), MVI  Weight change: -4.264 kg  Wt Readings from Last 3 Encounters:  12/26/20 91 kg  12/15/20 95.3 kg  11/25/20 94.3 kg     Consultants:see note  Procedures:see note Antimicrobials: Anti-infectives (From admission, onward)    None      Culture/Microbiology    Component Value Date/Time   SDES URINE, RANDOM 07/18/2018 1145   SPECREQUEST NONE 07/18/2018 1145   CULT  07/18/2018 1145    NO GROWTH Performed at [facility], [place] [address] [place], [place] [address]    REPTSTATUS 07/19/2018 FINAL 07/18/2018 1145    Other culture-see note  Unresulted Labs (From admission, onward)     Start     Ordered   12/26/20 6767  Basic metabolic panel  Daily,   R     Question:  Specimen collection method  Answer:  Lab=Lab collect   12/25/20 1016          Data Reviewed: I have personally reviewed following labs and imaging studies CBC: Recent Labs  Lab 12/24/20 0940 12/25/20 0209 12/26/20 0404  WBC 9.2 8.1 10.8*  HGB 11.4* 11.2* 11.7*  HCT 38.5* 38.7* 38.6*  MCV 76.5* 75.6* 73.2*   PLT 273 280 209   Basic Metabolic Panel: Recent Labs  Lab 12/24/20 0940 12/25/20 0209 12/26/20 0404  NA 143 141 137  K 4.5 4.1 3.8  CL 113* 107 102  CO2 21* 25 24  GLUCOSE 93 96 97  BUN 23 26* 29*  CREATININE 2.19* 2.45* 2.31*  CALCIUM 8.8* 8.9 8.7*  MG 2.1  --   --    GFR: Estimated Creatinine Clearance: 26.5 mL/min (A) (by C-G formula based on SCr of 2.31 mg/dL (H)). Liver Function Tests: Recent Labs  Lab 12/26/20 0404  AST 28  ALT 19  ALKPHOS 92  BILITOT 0.7  PROT 5.4*  ALBUMIN 3.0*   No results for input(s): LIPASE, AMYLASE in the last 168 hours. No results for input(s): AMMONIA in the last 168 hours. Coagulation Profile: No results for input(s): INR, PROTIME in the last 168 hours. Cardiac Enzymes: No results for input(s): CKTOTAL, CKMB, CKMBINDEX,  TROPONINI in the last 168 hours. BNP (last 3 results) Recent Labs    10/15/20 0841 10/27/20 0936 12/15/20 1127  PROBNP 5,768* 6,081* 18,617*   HbA1C: No results for input(s): HGBA1C in the last 72 hours. CBG: No results for input(s): GLUCAP in the last 168 hours. Lipid Profile: No results for input(s): CHOL, HDL, LDLCALC, TRIG, CHOLHDL, LDLDIRECT in the last 72 hours. Thyroid Function Tests: No results for input(s): TSH, T4TOTAL, FREET4, T3FREE, THYROIDAB in the last 72 hours. Anemia Panel: No results for input(s): VITAMINB12, FOLATE, FERRITIN, TIBC, IRON, RETICCTPCT in the last 72 hours. Sepsis Labs: Recent Labs  Lab 12/26/20 0404  LATICACIDVEN 1.0    Recent Results (from the past 240 hour(s))  Resp Panel by RT-PCR (Flu A&B, Covid) Nasopharyngeal Swab     Status: None   Collection Time: 12/24/20  8:56 AM   Specimen: Nasopharyngeal Swab; Nasopharyngeal(NP) swabs in vial transport medium  Result Value Ref Range Status   SARS Coronavirus 2 by RT PCR NEGATIVE NEGATIVE Final    Comment: (NOTE) SARS-CoV-2 target nucleic acids are NOT DETECTED.  The SARS-CoV-2 RNA is generally detectable in upper  respiratory specimens during the acute phase of infection. The lowest concentration of SARS-CoV-2 viral copies this assay can detect is 138 copies/mL. A negative result does not preclude SARS-Cov-2 infection and should not be used as the sole basis for treatment or other patient management decisions. A negative result may occur with  improper specimen collection/handling, submission of specimen other than nasopharyngeal swab, presence of viral mutation(s) within the areas targeted by this assay, and inadequate number of viral copies(<138 copies/mL). A negative result must be combined with clinical observations, patient history, and epidemiological information. The expected result is Negative.  Fact Sheet for Patients:  EntrepreneurPulse.com.au  Fact Sheet for Healthcare Providers:  IncredibleEmployment.be  This test is no t yet approved or cleared by the [place] FDA and  has been authorized for detection and/or diagnosis of SARS-CoV-2 by FDA under an Emergency Use Authorization (EUA). This EUA will remain  in effect (meaning this test can be used) for the duration of the COVID-19 declaration under Section 564(b)(1) of the Act, 21 U.S.C.section 360bbb-3(b)(1), unless the authorization is terminated  or revoked sooner.       Influenza A by PCR NEGATIVE NEGATIVE Final   Influenza B by PCR NEGATIVE NEGATIVE Final    Comment: (NOTE) The Xpert Xpress SARS-CoV-2/FLU/RSV plus assay is intended as an aid in the diagnosis of influenza from Nasopharyngeal swab specimens and should not be used as a sole basis for treatment. Nasal washings and aspirates are unacceptable for Xpert Xpress SARS-CoV-2/FLU/RSV testing.  Fact Sheet for Patients: EntrepreneurPulse.com.au  Fact Sheet for Healthcare Providers: IncredibleEmployment.be  This test is not yet approved or cleared by the [place] FDA and has been  authorized for detection and/or diagnosis of SARS-CoV-2 by FDA under an Emergency Use Authorization (EUA). This EUA will remain in effect (meaning this test can be used) for the duration of the COVID-19 declaration under Section 564(b)(1) of the Act, 21 U.S.C. section 360bbb-3(b)(1), unless the authorization is terminated or revoked.  Performed at [facility], [place] [address], [place], [place] [address]      Radiology Studies: DG CHEST PORT 1 VIEW  Result Date: 12/24/2020 CLINICAL DATA:  Acute on chronic combined systolic and diastolic CHF. EXAM: PORTABLE CHEST 1 VIEW COMPARISON:  09/20/2020 FINDINGS: Single-view of the chest demonstrates densities at the right lung base which could be associated with  volume loss or even pleural fluid. Heart is enlarged. Left lung is clear without overt pulmonary edema. Right shoulder replacement. IMPRESSION: 1. Right basilar chest densities. Findings could represent volume loss and/or pleural fluid. 2. Cardiomegaly without pulmonary edema. Electronically Signed   By: Markus Daft M.D.   On: 12/24/2020 11:24     LOS: 1 day   Antonieta Pert, MD [facility]  12/26/2020, 9:15 AM

## 2020-12-26 NOTE — Progress Notes (Signed)
Progress Note  Patient Name: Nicholas [name] Date of Encounter: 12/26/2020  Primary Cardiologist: None   Subjective   Significant output on diuretics; heart rates have improved.  Creatinine has come down.  Patient feels better but has some aches in his legs.  Inpatient Medications    Scheduled Meds:  allopurinol  100 mg Oral Daily   amiodarone  150 mg Intravenous Once   amLODipine  5 mg Oral Daily   apixaban  2.5 mg Oral BID   docusate sodium  100 mg Oral BID   feeding supplement  237 mL Oral BID BM   furosemide  80 mg Intravenous BID   latanoprost  1 drop Both Eyes QHS   mometasone-formoterol  2 puff Inhalation BID   multivitamin with minerals  1 tablet Oral Daily   pantoprazole  20 mg Oral Daily   Continuous Infusions:  amiodarone 30 mg/hr (12/26/20 0335)   PRN Meds: acetaminophen **OR** acetaminophen, bisacodyl, hydrALAZINE, morphine injection, ondansetron **OR** ondansetron (ZOFRAN) IV, oxyCODONE, polyethylene glycol   Vital Signs    Vitals:   12/26/20 0000 12/26/20 0400 12/26/20 0500 12/26/20 0709  BP: 118/69 112/74    Pulse: (!) 118 (!) 115  86  Resp: 18 19    Temp: 97.8 F (36.6 C) (!) 97.4 F (36.3 C)    TempSrc: Oral Oral    SpO2: 93% 96%    Weight: 91 kg  91 kg   Height:        Intake/Output Summary (Last 24 hours) at 12/26/2020 0749 Last data filed at 12/26/2020 0452 Gross per 24 hour  Intake 789.94 ml  Output 5350 ml  Net -4560.06 ml   Filed Weights   12/25/20 0000 12/26/20 0000 12/26/20 0500  Weight: 93.7 kg 91 kg 91 kg    Telemetry    AF RVR- Personally Reviewed  ECG    No new - Personally Reviewed  Physical Exam   Gen: No distress, elderly amel   Neck: JVD mid neck Cardiac: No Rubs or Gallops, holosystolic murmur Murmur, IRIR tachycardia, +2 radial pulses Respiratory: Clear to auscultation bilaterally, normal effort, normal  respiratory rate GI: Soft, nontender, non-distended  MS: no edema;  moves all  extremities Integument: Skin feels warm Neuro:  At time of evaluation, alert and oriented to person/place/time/situation  Psych: Normal affect, patient feels better    Labs    Chemistry Recent Labs  Lab 12/24/20 0940 12/25/20 0209 12/26/20 0404  NA 143 141 137  K 4.5 4.1 3.8  CL 113* 107 102  CO2 21* 25 24  GLUCOSE 93 96 97  BUN 23 26* 29*  CREATININE 2.19* 2.45* 2.31*  CALCIUM 8.8* 8.9 8.7*  PROT  --   --  5.4*  ALBUMIN  --   --  3.0*  AST  --   --  28  ALT  --   --  19  ALKPHOS  --   --  92  BILITOT  --   --  0.7  GFRNONAA 29* 25* 27*  ANIONGAP 9 9 11      Hematology Recent Labs  Lab 12/24/20 0940 12/25/20 0209 12/26/20 0404  WBC 9.2 8.1 10.8*  RBC 5.03 5.12 5.27  HGB 11.4* 11.2* 11.7*  HCT 38.5* 38.7* 38.6*  MCV 76.5* 75.6* 73.2*  MCH 22.7* 21.9* 22.2*  MCHC 29.6* 28.9* 30.3  RDW 21.9* 21.8* 21.5*  PLT 273 280 308    Cardiac EnzymesNo results for input(s): TROPONINI in the last 168 hours. No results  for input(s): TROPIPOC in the last 168 hours.   BNP Recent Labs  Lab 12/24/20 0941 12/26/20 0404  BNP 1,084.1* 1,213.1*     DDimer No results for input(s): DDIMER in the last 168 hours.   Radiology    DG CHEST PORT 1 VIEW  Result Date: 12/24/2020 CLINICAL DATA:  Acute on chronic combined systolic and diastolic CHF. EXAM: PORTABLE CHEST 1 VIEW COMPARISON:  09/20/2020 FINDINGS: Single-view of the chest demonstrates densities at the right lung base which could be associated with volume loss or even pleural fluid. Heart is enlarged. Left lung is clear without overt pulmonary edema. Right shoulder replacement. IMPRESSION: 1. Right basilar chest densities. Findings could represent volume loss and/or pleural fluid. 2. Cardiomegaly without pulmonary edema. Electronically Signed   By: Markus Daft M.D.   On: 12/24/2020 11:24    Patient Profile     87 y.o. male with decompendated HF, AKI and AFL  Assessment & Plan    AFL RVR CHADSVASC 5 HTN AKI on CKD  Stage IIIb (improving) COPD HFrEF (40-45%) in the setting of new AFL RVR - continue IV diuretics (2 more doses of lasix 80 IV BID) - continue Mearl Latin and Farxiga; no BB until better compensated - do not plan RHC at this time - potential DCCV 12/20 or later if continued improvement - on amiodarone  drip with persistent tachycardia will not transition to PO at this time - cautiously optimistic about his improvement - discussed at length with family  For questions or updates, please contact [facility] Please consult [email or web address] for contact info under Cardiology/STEMI.      Signed, Werner Lean, MD  12/26/2020, 7:49 AM

## 2020-12-26 NOTE — Progress Notes (Signed)
Occupational Therapy Treatment Patient Details Name: Nicholas [name] MRN: [id number] DOB: [birth year] Today's Date: 12/26/2020   History of present illness Pt is an 87 y/o male admitted 12/16 for cardioversion, however, cardioversion was cancelled as the pt was volume overloaded. PMH includes HTN, CKD, CHF, and a fib.   OT comments  Nicholas [name] is progressing well towards his acute OT goals. Attempted to educate on energy conservation however pt is severely HOH and could not verbalized understanding of strategies, he would benefit from a written hand out. Overall pt was mod I for bed mobility and min guard - supervision for functional ambulation and ADLs without AD, however pt did push the IV pole this session. He completed ~5 minutes OOB functional activity on 2L 02, HR elevated to 118 and had increased RR. He continues to benefit from OT acutely. D/c recommendation remains appropriate.    Recommendations for follow up therapy are one component of a multi-disciplinary discharge planning process, led by the attending physician.  Recommendations may be updated based on patient status, additional functional criteria and insurance authorization.    Follow Up Recommendations  Home health OT    Assistance Recommended at Discharge Intermittent Supervision/Assistance  Equipment Recommendations  Tub/shower seat       Precautions / Restrictions Precautions Precautions: Fall Restrictions Weight Bearing Restrictions: No       Mobility Bed Mobility Overal bed mobility: Needs Assistance Bed Mobility: Supine to Sit     Supine to sit: Modified independent (Device/Increase time)     General bed mobility comments: no physical assist, HOB up    Transfers Overall transfer level: Needs assistance Equipment used: None Transfers: Sit to/from Stand Sit to Stand: Supervision           General transfer comment: pt pushed IV pole when ambulating in the room. Good awareness of lines     Balance  Overall balance assessment: Needs assistance Sitting-balance support: No upper extremity supported;Feet supported Sitting balance-Leahy Scale: Good     Standing balance support: No upper extremity supported;During functional activity Standing balance-Leahy Scale: Fair           ADL either performed or assessed with clinical judgement   ADL Overall ADL's : Needs assistance/impaired Eating/Feeding: Independent;Sitting   Grooming: Standing;Min guard Grooming Details (indicate cue type and reason): at the sink             Lower Body Dressing: Min guard;Sit to/from stand Lower Body Dressing Details (indicate cue type and reason): donned shorts, mild LOB when standing to pull pants over hips Toilet Transfer: Min guard;Ambulation Toilet Transfer Details (indicate cue type and reason): no AD         Functional mobility during ADLs: Min guard General ADL Comments: quick to fatigue, noted mildly labored breathing at the end of the sessoin. SpO2 stable on 2L O2    Extremity/Trunk Assessment Upper Extremity Assessment Upper Extremity Assessment: Generalized weakness (4/5 throughout but overall WFL for tasks assessed)   Lower Extremity Assessment Lower Extremity Assessment: Defer to PT evaluation        Vision   Vision Assessment?: No apparent visual deficits Additional Comments: no change   Perception     Praxis      Cognition Arousal/Alertness: Awake/alert Behavior During Therapy: WFL for tasks assessed/performed Overall Cognitive Status: Within Functional Limits for tasks assessed             General Comments: HOH, will not state if he doesn't hear  Exercises     Shoulder Instructions       General Comments VSS on 2L [place].    Pertinent Vitals/ Pain       Pain Assessment: Faces Faces Pain Scale: Hurts a little bit Pain Location: L foot Pain Descriptors / Indicators: Discomfort Pain Intervention(s): Monitored during session   Frequency   Min 2X/week        Progress Toward Goals  OT Goals(current goals can now be found in the care plan section)  Progress towards OT goals: Progressing toward goals  Acute Rehab OT Goals OT Goal Formulation: With patient Time For Goal Achievement: 01/07/21 Potential to Achieve Goals: Good ADL Goals Additional ADL Goal #1: Pt will generalize energy conservation strategies in ADL and mobility. Additional ADL Goal #2: Pt will perform self care modified independently.  Plan Discharge plan remains appropriate       AM-PAC OT "6 Clicks" Daily Activity     Outcome Measure   Help from another person eating meals?: None Help from another person taking care of personal grooming?: A Little Help from another person toileting, which includes using toliet, bedpan, or urinal?: A Little Help from another person bathing (including washing, rinsing, drying)?: None Help from another person to put on and taking off regular upper body clothing?: A Little Help from another person to put on and taking off regular lower body clothing?: A Little 6 Click Score: 20    End of Session Equipment Utilized During Treatment: Gait belt;Oxygen  OT Visit Diagnosis: Unsteadiness on feet (R26.81);Other (comment)   Activity Tolerance Patient limited by fatigue   Patient Left in chair;with call bell/phone within reach   Nurse Communication Mobility status        Time: 8469-6295 OT Time Calculation (min): 21 min  Charges: OT General Charges $OT Visit: 1 Visit OT Treatments $Therapeutic Activity: 8-22 mins   Nichele Slawson A Roni Scow 12/26/2020, 12:36 PM

## 2020-12-26 NOTE — Plan of Care (Signed)
?  Problem: Education: ?Goal: Ability to demonstrate management of disease process will improve ?Outcome: Progressing ?  ?Problem: Activity: ?Goal: Capacity to carry out activities will improve ?Outcome: Progressing ?  ?Problem: Cardiac: ?Goal: Ability to achieve and maintain adequate cardiopulmonary perfusion will improve ?Outcome: Progressing ?  ?

## 2020-12-26 NOTE — Plan of Care (Signed)

## 2020-12-27 ENCOUNTER — Inpatient Hospital Stay (HOSPITAL_COMMUNITY): Payer: PPO

## 2020-12-27 LAB — BASIC METABOLIC PANEL
Anion gap: 12 (ref 5–15)
BUN: 38 mg/dL — ABNORMAL HIGH (ref 8–23)
CO2: 28 mmol/L (ref 22–32)
Calcium: 8.8 mg/dL — ABNORMAL LOW (ref 8.9–10.3)
Chloride: 96 mmol/L — ABNORMAL LOW (ref 98–111)
Creatinine, Ser: 2.55 mg/dL — ABNORMAL HIGH (ref 0.61–1.24)
GFR, Estimated: 24 mL/min — ABNORMAL LOW (ref >60)
Glucose, Bld: 105 mg/dL — ABNORMAL HIGH (ref 70–99)
Potassium: 3.8 mmol/L (ref 3.5–5.1)
Sodium: 136 mmol/L (ref 135–145)

## 2020-12-27 LAB — URIC ACID: Uric Acid, Serum: 9.4 mg/dL — ABNORMAL HIGH (ref 3.7–8.6)

## 2020-12-27 MED ORDER — METOPROLOL SUCCINATE ER 25 MG PO TB24
12.5000 mg | ORAL_TABLET | Freq: Every day | ORAL | Status: DC
  Administered 2020-12-27 – 2020-12-30 (×4): 12.5 mg via ORAL
  Filled 2020-12-27 (×4): qty 1

## 2020-12-27 NOTE — Progress Notes (Signed)
PROGRESS NOTE    Nicholas [name]  JJK:[id number] DOB: 01-Jun-1933 DOA: 12/24/2020 PCP: [name], [name], MD   Chief Complaint  Patient presents with   Shortness of Breath  Brief Narrative/Hospital Course: Nicholas [name], 87 y.o. male with PMH of  HTN; stage 3b CKD; chronic systolic CHF EF 37-16% previously 55-60%; atrial flutter last seen by Dr. Traci Sermon on 12/7 noted to be a flutter with RVR and plan for DCCV 12/16 but noted to be overloaded with fluid on exam with elevated JVD heart evaluated and progressive shortness of breath and left more than right lower extremity edema, weight of 210 lb previously 194 lb.  Patient was seen in Endo for cardioversion and Dr. Johney Frame requested [facility] admission for volume overload.   Patient was a flutter with RVR in 120s, placed on IV Lasix, amiodarone infusion and admitted, cardiology was consulted   Subjective: Seen and examined.  Patient feels much improved, family at the bedside Leg edema has improved Urine output 2850 ml, weight down 210> 206>200>194  lb.  Assessment & Plan:  Acute on chronic systolic CHFIn the setting of a flutter RVR: Presented with shortness of breath, A. fib RVR, elevated BNP, edematous legs, being managed with IV diuresis, rate control with IV amiodarone.on IV Lasix as per cardiology with close monitoring of renal function which is uptrending today-given his improvement in leg edema and swelling can probably hold off/decrease Lasix, defer to cardiology. Overload significantly better with net negative balance as below.  Continue to monitor intake output, daily weight,  cont low-salt diet.GDMT-Entresto Aldactone Wilder Glade not started 2/2 AKI defer to  cardiology.  Appreciate input Net IO Since Admission: -9,143.42 mL [12/27/20 1155]  Filed Weights   12/26/20 0000 12/26/20 0500 12/27/20 0325  Weight: 91 kg 91 kg 88 kg     A flutter with RVR: Continue amiodarone drip, DOAC-Home Eliquis,will defer to cardiology-for possible  cardioversion  HTN: Blood pressure well controlled on amlodipine, diuresis.  Continue to hold oral Cardizem  AKI on CKD stage IIIb: Creatinine was in 3s in October baseline 1.7-1.9, fluctuating in [place] 2.5 this morning unfortunately he continues to need  diuresis for CHF management monitor closely.  Recent Labs  Lab 12/24/20 0940 12/25/20 0209 12/26/20 0404 12/27/20 0154  BUN 23 26* 29* 38*  CREATININE 2.19* 2.45* 2.31* 2.55*  COPD:continue Dulera, bronchodilators, wheezing has improved.  Glaucoma: Continue home eyedrops Malnutrition of moderate degree: Augment diet as tolerated Overweight with BMI 29: Outpatient PC follow-up and weight loss  DVT prophylaxis: apixaban (ELIQUIS) tablet 2.5 mg Start: 12/24/20 2200 Code Status:   Code Status: Full Code Family Communication: plan of care discussed with patient at bedside. Status is: Inpatient Patient remains hospitalized for ongoing IV diuresis rate control with IV meds and Disposition: Currently not medically stable for discharge. Anticipated Disposition: Home  Objective: Vitals last 24 hrs: Vitals:   12/27/20 0909 12/27/20 0917 12/27/20 1050 12/27/20 1051  BP:   108/72   Pulse: (!) 109 62 64 61  Resp:    20  Temp:   97.9 F (36.6 C)   TempSrc:   Oral   SpO2:    92%  Weight:      Height:       Weight change: -2.994 kg  Intake/Output Summary (Last 24 hours) at 12/27/2020 1155 Last data filed at 12/27/2020 1052 Gross per 24 hour  Intake 687.16 ml  Output 3050 ml  Net -2362.84 ml   Net IO Since Admission: -9,143.42 mL [12/27/20 1155]  Physical Examination: General exam: AAOx 3, pleasant, elderly HEENT:Oral mucosa moist, Ear/Nose WNL grossly, dentition normal. Respiratory system: bilaterally diminished,, no use of accessory muscle Cardiovascular system: S1 & S2 +, No JVD,. Gastrointestinal system: Abdomen soft, NT,ND, BS+ Nervous System:Alert, awake, moving extremities and grossly nonfocal Extremities: no edema,  distal peripheral pulses palpable.  Skin: No rashes,no icterus. MSK: Normal muscle bulk,tone, power    Medications reviewed:  Scheduled Meds:  allopurinol  100 mg Oral Daily   amiodarone  150 mg Intravenous Once   amLODipine  5 mg Oral Daily   apixaban  2.5 mg Oral BID   docusate sodium  100 mg Oral BID   feeding supplement  237 mL Oral BID BM   latanoprost  1 drop Both Eyes QHS   mometasone-formoterol  2 puff Inhalation BID   multivitamin with minerals  1 tablet Oral Daily   pantoprazole  20 mg Oral Daily   Continuous Infusions:  amiodarone 30 mg/hr (12/27/20 0604)   Diet Order             Diet Heart Room service appropriate? Yes; Fluid consistency: Thin; Fluid restriction: 1500 mL Fluid  Diet effective now                   Nutrition Problem: Moderate Malnutrition Etiology: chronic illness (CHF) Signs/Symptoms: moderate fat depletion, moderate muscle depletion Interventions: Ensure Enlive (each supplement provides 350kcal and 20 grams of protein), MVI  Weight change: -2.994 kg  Wt Readings from Last 3 Encounters:  12/27/20 88 kg  12/15/20 95.3 kg  11/25/20 94.3 kg     Consultants:see note  Procedures:see note Antimicrobials: Anti-infectives (From admission, onward)    None      Culture/Microbiology    Component Value Date/Time   SDES URINE, RANDOM 07/18/2018 1145   SPECREQUEST NONE 07/18/2018 1145   CULT  07/18/2018 1145    NO GROWTH Performed at [facility], [place] [address], [place], [place] [address]    REPTSTATUS 07/19/2018 FINAL 07/18/2018 1145    Other culture-see note  Unresulted Labs (From admission, onward)     Start     Ordered   12/26/20 2706  Basic metabolic panel  Daily,   R     Question:  Specimen collection method  Answer:  Lab=Lab collect   12/25/20 1016          Data Reviewed: I have personally reviewed following labs and imaging studies CBC: Recent Labs  Lab 12/24/20 0940 12/25/20 0209 12/26/20 0404  WBC 9.2  8.1 10.8*  HGB 11.4* 11.2* 11.7*  HCT 38.5* 38.7* 38.6*  MCV 76.5* 75.6* 73.2*  PLT 273 280 237   Basic Metabolic Panel: Recent Labs  Lab 12/24/20 0940 12/25/20 0209 12/26/20 0404 12/27/20 0154  NA 143 141 137 136  K 4.5 4.1 3.8 3.8  CL 113* 107 102 96*  CO2 21* 25 24 28   GLUCOSE 93 96 97 105*  BUN 23 26* 29* 38*  CREATININE 2.19* 2.45* 2.31* 2.55*  CALCIUM 8.8* 8.9 8.7* 8.8*  MG 2.1  --   --   --    GFR: Estimated Creatinine Clearance: 23.7 mL/min (A) (by C-G formula based on SCr of 2.55 mg/dL (H)). Liver Function Tests: Recent Labs  Lab 12/26/20 0404  AST 28  ALT 19  ALKPHOS 92  BILITOT 0.7  PROT 5.4*  ALBUMIN 3.0*   No results for input(s): LIPASE, AMYLASE in the last 168 hours. No results for input(s): AMMONIA in the  last 168 hours. Coagulation Profile: No results for input(s): INR, PROTIME in the last 168 hours. Cardiac Enzymes: No results for input(s): CKTOTAL, CKMB, CKMBINDEX, TROPONINI in the last 168 hours. BNP (last 3 results) Recent Labs    10/15/20 0841 10/27/20 0936 12/15/20 1127  PROBNP 5,768* 6,081* 18,617*   HbA1C: No results for input(s): HGBA1C in the last 72 hours. CBG: No results for input(s): GLUCAP in the last 168 hours. Lipid Profile: No results for input(s): CHOL, HDL, LDLCALC, TRIG, CHOLHDL, LDLDIRECT in the last 72 hours. Thyroid Function Tests: No results for input(s): TSH, T4TOTAL, FREET4, T3FREE, THYROIDAB in the last 72 hours. Anemia Panel: No results for input(s): VITAMINB12, FOLATE, FERRITIN, TIBC, IRON, RETICCTPCT in the last 72 hours. Sepsis Labs: Recent Labs  Lab 12/26/20 0404  LATICACIDVEN 1.0    Recent Results (from the past 240 hour(s))  Resp Panel by RT-PCR (Flu A&B, Covid) Nasopharyngeal Swab     Status: None   Collection Time: 12/24/20  8:56 AM   Specimen: Nasopharyngeal Swab; Nasopharyngeal(NP) swabs in vial transport medium  Result Value Ref Range Status   SARS Coronavirus 2 by RT PCR NEGATIVE NEGATIVE  Final    Comment: (NOTE) SARS-CoV-2 target nucleic acids are NOT DETECTED.  The SARS-CoV-2 RNA is generally detectable in upper respiratory specimens during the acute phase of infection. The lowest concentration of SARS-CoV-2 viral copies this assay can detect is 138 copies/mL. A negative result does not preclude SARS-Cov-2 infection and should not be used as the sole basis for treatment or other patient management decisions. A negative result may occur with  improper specimen collection/handling, submission of specimen other than nasopharyngeal swab, presence of viral mutation(s) within the areas targeted by this assay, and inadequate number of viral copies(<138 copies/mL). A negative result must be combined with clinical observations, patient history, and epidemiological information. The expected result is Negative.  Fact Sheet for Patients:  EntrepreneurPulse.com.au  Fact Sheet for Healthcare Providers:  IncredibleEmployment.be  This test is no t yet approved or cleared by the [place] FDA and  has been authorized for detection and/or diagnosis of SARS-CoV-2 by FDA under an Emergency Use Authorization (EUA). This EUA will remain  in effect (meaning this test can be used) for the duration of the COVID-19 declaration under Section 564(b)(1) of the Act, 21 U.S.C.section 360bbb-3(b)(1), unless the authorization is terminated  or revoked sooner.       Influenza A by PCR NEGATIVE NEGATIVE Final   Influenza B by PCR NEGATIVE NEGATIVE Final    Comment: (NOTE) The Xpert Xpress SARS-CoV-2/FLU/RSV plus assay is intended as an aid in the diagnosis of influenza from Nasopharyngeal swab specimens and should not be used as a sole basis for treatment. Nasal washings and aspirates are unacceptable for Xpert Xpress SARS-CoV-2/FLU/RSV testing.  Fact Sheet for Patients: EntrepreneurPulse.com.au  Fact Sheet for Healthcare  Providers: IncredibleEmployment.be  This test is not yet approved or cleared by the [place] FDA and has been authorized for detection and/or diagnosis of SARS-CoV-2 by FDA under an Emergency Use Authorization (EUA). This EUA will remain in effect (meaning this test can be used) for the duration of the COVID-19 declaration under Section 564(b)(1) of the Act, 21 U.S.C. section 360bbb-3(b)(1), unless the authorization is terminated or revoked.  Performed at [facility], [place] [address] [place], [place] [address]      Radiology Studies: No results found.   LOS: 2 days   Antonieta Pert, MD [facility]  12/27/2020, 11:55 AM

## 2020-12-27 NOTE — Progress Notes (Signed)
Physical Therapy Treatment Patient Details Name: Nicholas [name] MRN: [id number] DOB: 1933/07/11 Today's Date: 12/27/2020   History of Present Illness Pt is an 87 y/o male admitted 12/16 for cardioversion, however, cardioversion was cancelled as the pt was volume overloaded. PMH includes HTN, CKD, CHF, and a fib.    PT Comments    Patient progressing some with ambulation distance, but some limitations due to L ankle pain.  Denies gout and reports may be due to weather change.  Patient's daughter present and supportive.  Feel he should be stable for home upon d/c with follow up HHPT.    Recommendations for follow up therapy are one component of a multi-disciplinary discharge planning process, led by the attending physician.  Recommendations may be updated based on patient status, additional functional criteria and insurance authorization.  Follow Up Recommendations  Home health PT     Assistance Recommended at Discharge Intermittent Supervision/Assistance  Equipment Recommendations  Other (comment) (TBA)    Recommendations for Other Services       Precautions / Restrictions Precautions Precautions: Fall     Mobility  Bed Mobility   Bed Mobility: Supine to Sit     Supine to sit: Modified independent (Device/Increase time)     General bed mobility comments: no physical assist, but assist for lines, HOB up    Transfers Overall transfer level: Needs assistance Equipment used: Rolling walker (2 wheels) Transfers: Sit to/from Stand Sit to Stand: Supervision           General transfer comment: shoes applied in sitting due to L ankle pain    Ambulation/Gait Ambulation/Gait assistance: Min guard Gait Distance (Feet): 150 Feet Assistive device: Rolling walker (2 wheels) Gait Pattern/deviations: Step-through pattern;Decreased stride length;Trunk flexed;Wide base of support;Antalgic       General Gait Details: heavily reliant on RW for ambulation, minguard for  balance/safety; walked around bed without walker due to tight space and leaned over to place hand on bed almost with LOB needing min A for safety   Stairs             Wheelchair Mobility    Modified Rankin (Stroke Patients Only)       Balance Overall balance assessment: Needs assistance Sitting-balance support: No upper extremity supported;Feet supported Sitting balance-Leahy Scale: Good     Standing balance support: No upper extremity supported;During functional activity   Standing balance comment: static balance without UE support, needs to have a hand on something when he walks                            Cognition Arousal/Alertness: Awake/alert Behavior During Therapy: WFL for tasks assessed/performed Overall Cognitive Status: Within Functional Limits for tasks assessed                                          Exercises      General Comments General comments (skin integrity, edema, etc.): on RA SpO2 not able to read with ambulation due to heavy UE support on walker, after removes hand SpO2 96%      Pertinent Vitals/Pain Faces Pain Scale: Hurts a little bit Pain Location: L foot Pain Descriptors / Indicators: Discomfort Pain Intervention(s): Monitored during session;Other (comment) (applied shoes)    Home Living  Prior Function            PT Goals (current goals can now be found in the care plan section) Progress towards PT goals: Progressing toward goals    Frequency    Min 3X/week      PT Plan Current plan remains appropriate    Co-evaluation              AM-PAC PT "6 Clicks" Mobility   Outcome Measure  Help needed turning from your back to your side while in a flat bed without using bedrails?: None Help needed moving from lying on your back to sitting on the side of a flat bed without using bedrails?: None Help needed moving to and from a bed to a chair (including a  wheelchair)?: A Little Help needed standing up from a chair using your arms (e.g., wheelchair or bedside chair)?: A Little Help needed to walk in hospital room?: A Little Help needed climbing 3-5 steps with a railing? : A Little 6 Click Score: 20    End of Session Equipment Utilized During Treatment: Gait belt Activity Tolerance: Patient tolerated treatment well Patient left: in chair;with call bell/phone within reach;with family/visitor present   PT Visit Diagnosis: Other abnormalities of gait and mobility (R26.89);Muscle weakness (generalized) (M62.81)     Time: 3016-0109 PT Time Calculation (min) (ACUTE ONLY): 25 min  Charges:  $Gait Training: 8-22 mins $Therapeutic Activity: 8-22 mins                     Magda Kiel, PT Acute Rehabilitation Services Pager:[phone] [phone] 12/27/2020    Reginia Naas 12/27/2020, 1:48 PM

## 2020-12-27 NOTE — Progress Notes (Signed)
Mobility Specialist Progress Note:   12/27/20 1623  Mobility  Activity Ambulated in hall  Level of Assistance Standby assist, set-up cues, supervision of patient - no hands on  Assistive Device Front wheel walker  Distance Ambulated (ft) 230 ft  Mobility Ambulated with assistance in hallway  Mobility Response Tolerated well  Mobility performed by Mobility specialist  $Mobility charge 1 Mobility   Pt received in bed willing to participate in mobility. No complaints of pain. Pt returned to bed with call bell in reach and all needs met.   [facility] Public librarian Phone [phone] Secondary Phone [phone]

## 2020-12-27 NOTE — Progress Notes (Signed)
Progress Note  Patient Name: Nicholas [name] Date of Encounter: 12/27/2020  Primary Cardiologist: None   Subjective   Patient seen and examined at his bedside.  His daughter was by the bedside when I arrived.  Inpatient Medications    Scheduled Meds:  allopurinol  100 mg Oral Daily   amiodarone  150 mg Intravenous Once   amLODipine  5 mg Oral Daily   apixaban  2.5 mg Oral BID   docusate sodium  100 mg Oral BID   feeding supplement  237 mL Oral BID BM   furosemide  80 mg Intravenous BID   latanoprost  1 drop Both Eyes QHS   mometasone-formoterol  2 puff Inhalation BID   multivitamin with minerals  1 tablet Oral Daily   pantoprazole  20 mg Oral Daily   Continuous Infusions:  amiodarone 30 mg/hr (12/27/20 0604)   PRN Meds: acetaminophen **OR** acetaminophen, bisacodyl, hydrALAZINE, morphine injection, ondansetron **OR** ondansetron (ZOFRAN) IV, oxyCODONE, polyethylene glycol   Vital Signs    Vitals:   12/27/20 0704 12/27/20 0902 12/27/20 0908 12/27/20 0909  BP:  130/89    Pulse: 66 (!) 113 (!) 110 (!) 109  Resp:  20    Temp:      TempSrc:      SpO2: 93% 94%    Weight:      Height:        Intake/Output Summary (Last 24 hours) at 12/27/2020 0911 Last data filed at 12/27/2020 0604 Gross per 24 hour  Intake 567.16 ml  Output 2850 ml  Net -2282.84 ml   Filed Weights   12/26/20 0000 12/26/20 0500 12/27/20 0325  Weight: 91 kg 91 kg 88 kg    Telemetry    Atrial fibrillation- Personally Reviewed  ECG    No new today- Personally Reviewed  Physical Exam   GEN: No acute distress.   Neck: No JVD Cardiac: RRR, no murmurs, rubs, or gallops.  Respiratory: Clear to auscultation bilaterally. GI: Soft, nontender, non-distended  MS: No edema; No deformity. Neuro:  Nonfocal  Psych: Normal affect   Labs    Chemistry Recent Labs  Lab 12/25/20 0209 12/26/20 0404 12/27/20 0154  NA 141 137 136  K 4.1 3.8 3.8  CL 107 102 96*  CO2 25 24 28   GLUCOSE 96 97  105*  BUN 26* 29* 38*  CREATININE 2.45* 2.31* 2.55*  CALCIUM 8.9 8.7* 8.8*  PROT  --  5.4*  --   ALBUMIN  --  3.0*  --   AST  --  28  --   ALT  --  19  --   ALKPHOS  --  92  --   BILITOT  --  0.7  --   GFRNONAA 25* 27* 24*  ANIONGAP 9 11 12      Hematology Recent Labs  Lab 12/24/20 0940 12/25/20 0209 12/26/20 0404  WBC 9.2 8.1 10.8*  RBC 5.03 5.12 5.27  HGB 11.4* 11.2* 11.7*  HCT 38.5* 38.7* 38.6*  MCV 76.5* 75.6* 73.2*  MCH 22.7* 21.9* 22.2*  MCHC 29.6* 28.9* 30.3  RDW 21.9* 21.8* 21.5*  PLT 273 280 308    Cardiac EnzymesNo results for input(s): TROPONINI in the last 168 hours. No results for input(s): TROPIPOC in the last 168 hours.   BNP Recent Labs  Lab 12/24/20 0941 12/26/20 0404  BNP 1,084.1* 1,213.1*     DDimer No results for input(s): DDIMER in the last 168 hours.   Radiology    No results  found.  Cardiac Studies   Tte 10/2020 IMPRESSIONS   1. Rapid atrial fibrillation/ flutter. Left ventricular ejection  fraction, by estimation, is 40 to 45%. The left ventricle has mildly  decreased function. The left ventricle has no regional wall motion  abnormalities. There is moderate concentric left  ventricular hypertrophy. Left ventricular diastolic parameters are  indeterminate.   2. Right ventricular systolic function is normal. The right ventricular  size is normal. There is moderately elevated pulmonary artery systolic  pressure.   3. Left atrial size was moderately dilated.   4. The mitral valve is normal in structure. Mild mitral valve  regurgitation. No evidence of mitral stenosis.   5. The aortic valve is tricuspid. There is mild calcification of the  aortic valve. There is mild thickening of the aortic valve. Aortic valve  regurgitation is mild. Mild aortic valve stenosis.   6. There is moderate dilatation of the ascending aorta, measuring 39 mm.   7. The inferior vena cava is dilated in size with <50% respiratory  variability, suggesting  right atrial pressure of 15 mmHg.   FINDINGS   Left Ventricle: Rapid atrial fibrillation/ flutter. Left ventricular  ejection fraction, by estimation, is 40 to 45%. The left ventricle has  mildly decreased function. The left ventricle has no regional wall motion  abnormalities. The left ventricular  internal cavity size was normal in size. There is moderate concentric left  ventricular hypertrophy. Left ventricular diastolic parameters are  indeterminate.   Right Ventricle: The right ventricular size is normal. No increase in  right ventricular wall thickness. Right ventricular systolic function is  normal. There is moderately elevated pulmonary artery systolic pressure.  The tricuspid regurgitant velocity is  3.00 m/s, and with an assumed right atrial pressure of 15 mmHg, the  estimated right ventricular systolic pressure is 02.7 mmHg.   Left Atrium: Left atrial size was moderately dilated.   Right Atrium: Right atrial size was normal in size.   Pericardium: There is no evidence of pericardial effusion.   Mitral Valve: The mitral valve is normal in structure. Mild mitral annular  calcification. Mild mitral valve regurgitation. No evidence of mitral  valve stenosis.   Tricuspid Valve: The tricuspid valve is normal in structure. Tricuspid  valve regurgitation is trivial. No evidence of tricuspid stenosis.   Aortic Valve: The aortic valve is tricuspid. There is mild calcification  of the aortic valve. There is mild thickening of the aortic valve. Aortic  valve regurgitation is mild. Mild aortic stenosis is present. Aortic valve  mean gradient measures 17.0  mmHg. Aortic valve peak gradient measures 28.0 mmHg. Aortic valve area, by  VTI measures 1.82 cm.   Pulmonic Valve: The pulmonic valve was normal in structure. Pulmonic valve  regurgitation is not visualized. No evidence of pulmonic stenosis.   Aorta: The aortic root is normal in size and structure and the aortic arch  was  not well visualized. There is moderate dilatation of the ascending  aorta, measuring 39 mm.   Venous: The pulmonary veins were not well visualized. The inferior vena  cava is dilated in size with less than 50% respiratory variability,  suggesting right atrial pressure of 15 mmHg.   IAS/Shunts: No atrial level shunt detected by color flow Doppler.   Patient Profile     87 y.o. male with a history of heart failure reduced ejection fraction, AKI and atrial flutter.  Assessment & Plan    Atrial fibrillation with rapid ventricular rate Hypertension Acute on chronic  CKD stage IIIb COPD Heart failure with reduced ejection fraction for  Clinical history appears to be improving slightly but can still benefit from IV diuretics but unfortunately his creatinine is trending up I will cut back on his Lasix 80 milligrams IV once daily for now Due to worsening kidney failure as his Lisabeth Register and spironolactone has been held. Okay to start low-dose beta-blocker Toprol-XL 12.5 mg. Could potentially consider cardioversion planned for Wednesday. Discussed with the patient daughter at the bedside.     For questions or updates, please contact [place] Please consult [email or web address] for contact info under Cardiology/STEMI.      Signed, Berniece Salines, DO  12/27/2020, 9:11 AM

## 2020-12-28 LAB — BASIC METABOLIC PANEL
Anion gap: 11 (ref 5–15)
BUN: 45 mg/dL — ABNORMAL HIGH (ref 8–23)
CO2: 29 mmol/L (ref 22–32)
Calcium: 8.3 mg/dL — ABNORMAL LOW (ref 8.9–10.3)
Chloride: 95 mmol/L — ABNORMAL LOW (ref 98–111)
Creatinine, Ser: 2.58 mg/dL — ABNORMAL HIGH (ref 0.61–1.24)
GFR, Estimated: 24 mL/min — ABNORMAL LOW (ref >60)
Glucose, Bld: 109 mg/dL — ABNORMAL HIGH (ref 70–99)
Potassium: 3.7 mmol/L (ref 3.5–5.1)
Sodium: 135 mmol/L (ref 135–145)

## 2020-12-28 MED ORDER — FUROSEMIDE 10 MG/ML IJ SOLN
80.0000 mg | Freq: Every day | INTRAMUSCULAR | Status: DC
  Administered 2020-12-28 – 2020-12-29 (×2): 80 mg via INTRAVENOUS
  Filled 2020-12-28 (×2): qty 8

## 2020-12-28 NOTE — Progress Notes (Signed)
Mobility Specialist Progress Note:   12/28/20 1402  Mobility  Activity Ambulated in hall  Level of Assistance Modified independent, requires aide device or extra time  Assistive Device Front wheel walker  Distance Ambulated (ft) 230 ft  Mobility Ambulated with assistance in hallway  Mobility Response Tolerated well  Mobility performed by Mobility specialist  $Mobility charge 1 Mobility   Pt received in bed willing to participate in mobility. No complaints of pain. Pt returned to bed with call bell in reach and all needs met.   [facility] Public librarian Phone [phone] Secondary Phone [phone]

## 2020-12-28 NOTE — Progress Notes (Signed)
Progress Note  Patient Name: Nicholas [name] Date of Encounter: 12/28/2020  Primary Cardiologist: None   Subjective   Patient seen and examined at his bedside.  His grandson was by the bedside. Inpatient Medications    Scheduled Meds:  allopurinol  100 mg Oral Daily   amiodarone  150 mg Intravenous Once   amLODipine  5 mg Oral Daily   apixaban  2.5 mg Oral BID   docusate sodium  100 mg Oral BID   feeding supplement  237 mL Oral BID BM   furosemide  80 mg Intravenous Daily   latanoprost  1 drop Both Eyes QHS   metoprolol succinate  12.5 mg Oral Daily   mometasone-formoterol  2 puff Inhalation BID   multivitamin with minerals  1 tablet Oral Daily   pantoprazole  20 mg Oral Daily   Continuous Infusions:  amiodarone 30 mg/hr (12/28/20 0639)   PRN Meds: acetaminophen **OR** acetaminophen, bisacodyl, hydrALAZINE, morphine injection, ondansetron **OR** ondansetron (ZOFRAN) IV, oxyCODONE, polyethylene glycol   Vital Signs    Vitals:   12/28/20 0839 12/28/20 1109 12/28/20 1118 12/28/20 1121  BP:  109/84    Pulse:  (!) 109 (!) 56 (!) 109  Resp:  20    Temp:  97.9 F (36.6 C)    TempSrc:  Oral    SpO2: 95% 95%    Weight:      Height:        Intake/Output Summary (Last 24 hours) at 12/28/2020 1135 Last data filed at 12/28/2020 3267 Gross per 24 hour  Intake 700 ml  Output 1050 ml  Net -350 ml   Filed Weights   12/26/20 0500 12/27/20 0325 12/28/20 0129  Weight: 91 kg 88 kg 88.5 kg    Telemetry    Atrial fibrillation- Personally Reviewed  ECG    No new today- Personally Reviewed  Physical Exam   GEN: No acute distress.   Neck: No JVD Cardiac: RRR, no murmurs, rubs, or gallops.  Respiratory: Clear to auscultation bilaterally. GI: Soft, nontender, non-distended  MS: No edema; No deformity. Neuro:  Nonfocal  Psych: Normal affect   Labs    Chemistry Recent Labs  Lab 12/26/20 0404 12/27/20 0154 12/28/20 0217  NA 137 136 135  K 3.8 3.8 3.7  CL  102 96* 95*  CO2 24 28 29   GLUCOSE 97 105* 109*  BUN 29* 38* 45*  CREATININE 2.31* 2.55* 2.58*  CALCIUM 8.7* 8.8* 8.3*  PROT 5.4*  --   --   ALBUMIN 3.0*  --   --   AST 28  --   --   ALT 19  --   --   ALKPHOS 92  --   --   BILITOT 0.7  --   --   GFRNONAA 27* 24* 24*  ANIONGAP 11 12 11      Hematology Recent Labs  Lab 12/24/20 0940 12/25/20 0209 12/26/20 0404  WBC 9.2 8.1 10.8*  RBC 5.03 5.12 5.27  HGB 11.4* 11.2* 11.7*  HCT 38.5* 38.7* 38.6*  MCV 76.5* 75.6* 73.2*  MCH 22.7* 21.9* 22.2*  MCHC 29.6* 28.9* 30.3  RDW 21.9* 21.8* 21.5*  PLT 273 280 308    Cardiac EnzymesNo results for input(s): TROPONINI in the last 168 hours. No results for input(s): TROPIPOC in the last 168 hours.   BNP Recent Labs  Lab 12/24/20 0941 12/26/20 0404  BNP 1,084.1* 1,213.1*     DDimer No results for input(s): DDIMER in the last 168 hours.  Radiology    DG Foot 2 Views Left  Result Date: 12/27/2020 CLINICAL DATA:  Left foot pain. EXAM: LEFT FOOT - 2 VIEW COMPARISON:  None. FINDINGS: There is no evidence for acute fracture or dislocation. Joint spaces are well maintained. There is mild hallux valgus. Peripheral vascular calcifications are present. There are minimal soft tissue calcifications adjacent to the medial aspect of the first metatarsal head. No underlying erosive change. IMPRESSION: 1. No acute bony abnormality. 2. Mild hallux valgus. 3. Mild soft tissue calcifications adjacent to the first metatarsal head without underlying osseous abnormality. These are nonspecific. Gout not excluded. Electronically Signed   By: Ronney Asters M.D.   On: 12/27/2020 16:08    Cardiac Studies   Tte 10/2020 IMPRESSIONS   1. Rapid atrial fibrillation/ flutter. Left ventricular ejection  fraction, by estimation, is 40 to 45%. The left ventricle has mildly  decreased function. The left ventricle has no regional wall motion  abnormalities. There is moderate concentric left  ventricular  hypertrophy. Left ventricular diastolic parameters are  indeterminate.   2. Right ventricular systolic function is normal. The right ventricular  size is normal. There is moderately elevated pulmonary artery systolic  pressure.   3. Left atrial size was moderately dilated.   4. The mitral valve is normal in structure. Mild mitral valve  regurgitation. No evidence of mitral stenosis.   5. The aortic valve is tricuspid. There is mild calcification of the  aortic valve. There is mild thickening of the aortic valve. Aortic valve  regurgitation is mild. Mild aortic valve stenosis.   6. There is moderate dilatation of the ascending aorta, measuring 39 mm.   7. The inferior vena cava is dilated in size with <50% respiratory  variability, suggesting right atrial pressure of 15 mmHg.   FINDINGS   Left Ventricle: Rapid atrial fibrillation/ flutter. Left ventricular  ejection fraction, by estimation, is 40 to 45%. The left ventricle has  mildly decreased function. The left ventricle has no regional wall motion  abnormalities. The left ventricular  internal cavity size was normal in size. There is moderate concentric left  ventricular hypertrophy. Left ventricular diastolic parameters are  indeterminate.   Right Ventricle: The right ventricular size is normal. No increase in  right ventricular wall thickness. Right ventricular systolic function is  normal. There is moderately elevated pulmonary artery systolic pressure.  The tricuspid regurgitant velocity is  3.00 m/s, and with an assumed right atrial pressure of 15 mmHg, the  estimated right ventricular systolic pressure is 89.2 mmHg.   Left Atrium: Left atrial size was moderately dilated.   Right Atrium: Right atrial size was normal in size.   Pericardium: There is no evidence of pericardial effusion.   Mitral Valve: The mitral valve is normal in structure. Mild mitral annular  calcification. Mild mitral valve regurgitation. No evidence  of mitral  valve stenosis.   Tricuspid Valve: The tricuspid valve is normal in structure. Tricuspid  valve regurgitation is trivial. No evidence of tricuspid stenosis.   Aortic Valve: The aortic valve is tricuspid. There is mild calcification  of the aortic valve. There is mild thickening of the aortic valve. Aortic  valve regurgitation is mild. Mild aortic stenosis is present. Aortic valve  mean gradient measures 17.0  mmHg. Aortic valve peak gradient measures 28.0 mmHg. Aortic valve area, by  VTI measures 1.82 cm.   Pulmonic Valve: The pulmonic valve was normal in structure. Pulmonic valve  regurgitation is not visualized. No evidence of pulmonic stenosis.  Aorta: The aortic root is normal in size and structure and the aortic arch  was not well visualized. There is moderate dilatation of the ascending  aorta, measuring 39 mm.   Venous: The pulmonary veins were not well visualized. The inferior vena  cava is dilated in size with less than 50% respiratory variability,  suggesting right atrial pressure of 15 mmHg.   IAS/Shunts: No atrial level shunt detected by color flow Doppler.   Patient Profile     87 y.o. male with a history of heart failure reduced ejection fraction, AKI and atrial flutter.  Assessment & Plan    Atrial fibrillation with rapid ventricular rate Hypertension Acute on chronic CKD stage IIIb COPD Heart failure with reduced ejection fraction for  Unfortunately his creatinine continues to increase.  We transition the patient to 80 mg Lasix IV daily yesterday.  But I think for now we will be beneficial to hold his Lasix dosing tomorrow. Due to worsening kidney failure as his Lisabeth Register and spironolactone has been held. He tolerated the low-dose beta-blocker Toprol-XL 12.5 mg that was started yesterday.  Continue this. Plan for cardioversion on Thursday morning this has been scheduled.  We will keep the patient n.p.o. past midnight tomorrow night.    Discussed with grandson at bedside.    For questions or updates, please contact [place] Please consult [email or web address] for contact info under Cardiology/STEMI.      Signed, Berniece Salines, DO  12/28/2020, 11:35 AM

## 2020-12-28 NOTE — Progress Notes (Signed)
PROGRESS NOTE    Nicholas [name]  OZY:[id number] DOB: January 21, 1933 DOA: 12/24/2020 PCP: [name], [name], MD   Chief Complaint  Patient presents with   Shortness of Breath  Brief Narrative/Hospital Course: Nicholas [name], 87 y.o. male with PMH of  HTN; stage 3b CKD; chronic systolic CHF EF 04-88% previously 55-60%; atrial flutter last seen by Dr. Traci Sermon on 12/7 noted to be a flutter with RVR and plan for DCCV 12/16 but noted to be overloaded with fluid on exam with elevated JVD heart evaluated and progressive shortness of breath and left more than right lower extremity edema, weight of 210 lb previously 194 lb.  Patient was seen in Endo for cardioversion and Dr. Johney Frame requested [facility] admission for volume overload.   Patient was a flutter with RVR in 120s, placed on IV Lasix, amiodarone infusion and admitted, cardiology was consulted Patient was managed aggressive IV diuresis, IV admitted and treated. Notable diuresis with improvement in her shortness of breath, leg edema  Subjective: Seen this am grandson at bedside Feels well Overnight no fever heart rate in 90s to 110. Tolerating nasal cannula 2l, blood pressure 90s to 100 Urine output slowed down after cutting down Lasix  to 1050 ml, weight down 210> 206>200>194> slightly up 195 lb.  Lft foot pain yesterday now resolved.  Assessment & Plan:  Acute on chronic systolic CHFIn the setting of a flutter RVR: Presented with shortness of breath, A. fib RVR, elevated BNP, edematous legs, being managed with IV diuresis, rate control with IV amiodarone.on IV Lasix - changed to 80 mg iv daily.Overall leg edema has improved with net negative balance and improvement in weight as below. Continue to monitor intake output, daily weight,  cont low-salt diet.GDMT-Entresto Aldactone Wilder Glade not started 2/2 AKI defer to  cardiology.  Appreciate input Net IO Since Admission: -9,493.42 mL [12/28/20 0928]  Filed Weights   12/26/20 0500 12/27/20 0325  12/28/20 0129  Weight: 91 kg 88 kg 88.5 kg     A flutter with RVR rate fairly stable, on amiodarone drip, Eliquis.  Noted plan for cardioversion Wednesday.  HTN: BP is soft 90-100.  On amlodipine 5 mg, can hold, also on Lasix. Continue to hold oral Cardizem  AKI on CKD stage IIIb: Creatinine was in 3s in October baseline 1.7-1.9, fluctuating in in 2.5 -monitor closely given ongoing need for diuresis.  Recent Labs  Lab 12/24/20 0940 12/25/20 0209 12/26/20 0404 12/27/20 0154 12/28/20 0217  BUN 23 26* 29* 38* 45*  CREATININE 2.19* 2.45* 2.31* 2.55* 2.58*  COPD: Stable, continue Dulera, bronchodilators, wheezing has improved-which I suspect was due to CHF.  Glaucoma: Continue home eyedrops Malnutrition of moderate degree: Augment diet as tolerated Left foot pain suspecting gout flareup uric acid high in 9s, x-ray mild soft tissue calcification to the first metatarsal head nonspecific, gout not excluded.  Continue allopurinol, pain control, consider colchicine if pain again-this am no pain non tender on exam.  Overweight with BMI 29: Outpatient PC follow-up and weight loss  DVT prophylaxis: apixaban (ELIQUIS) tablet 2.5 mg Start: 12/24/20 2200 Code Status:   Code Status: Full Code Family Communication: plan of care discussed with patient and grandson- at bedside. Status is: Inpatient Patient remains hospitalized for ongoing IV diuresis rate control with IV meds and Disposition: Currently not medically stable for discharge. Anticipated Disposition: Home once rh better and okay w/ cardio- possible cardioversion I nam  Objective: Vitals last 24 hrs: Vitals:   12/28/20 0647 12/28/20 0700 12/28/20 0819 12/28/20  0839  BP:      Pulse: 93  61   Resp: 17 20    Temp:      TempSrc:      SpO2:    95%  Weight:      Height:       Weight change: 0.544 kg  Intake/Output Summary (Last 24 hours) at 12/28/2020 2841 Last data filed at 12/28/2020 3244 Gross per 24 hour  Intake 700 ml   Output 1250 ml  Net -550 ml   Net IO Since Admission: -9,493.42 mL [12/28/20 0928]   Physical Examination: General exam: AAOx 3, elderly older than stated age, weak appearing. HEENT:Oral mucosa moist, Ear/Nose WNL grossly, dentition normal. Respiratory system: bilaterally diminished BS, no use of accessory muscle Cardiovascular system: S1 & S2 +, No JVD,. Gastrointestinal system: Abdomen soft, NT,ND, BS+ Nervous System:Alert, awake, moving extremities and grossly nonfocal Extremities: No edema, distal peripheral pulses palpable.  Skin: No rashes,no icterus. MSK: Normal muscle bulk,tone, power   Medications reviewed:  Scheduled Meds:  allopurinol  100 mg Oral Daily   amiodarone  150 mg Intravenous Once   amLODipine  5 mg Oral Daily   apixaban  2.5 mg Oral BID   docusate sodium  100 mg Oral BID   feeding supplement  237 mL Oral BID BM   furosemide  80 mg Intravenous Daily   latanoprost  1 drop Both Eyes QHS   metoprolol succinate  12.5 mg Oral Daily   mometasone-formoterol  2 puff Inhalation BID   multivitamin with minerals  1 tablet Oral Daily   pantoprazole  20 mg Oral Daily   Continuous Infusions:  amiodarone 30 mg/hr (12/28/20 0102)   Diet Order             Diet Heart Room service appropriate? Yes; Fluid consistency: Thin; Fluid restriction: 1500 mL Fluid  Diet effective now                   Nutrition Problem: Moderate Malnutrition Etiology: chronic illness (CHF) Signs/Symptoms: moderate fat depletion, moderate muscle depletion Interventions: Ensure Enlive (each supplement provides 350kcal and 20 grams of protein), MVI  Weight change: 0.544 kg  Wt Readings from Last 3 Encounters:  12/28/20 88.5 kg  12/15/20 95.3 kg  11/25/20 94.3 kg     Consultants:see note  Procedures:see note Antimicrobials: Anti-infectives (From admission, onward)    None      Culture/Microbiology    Component Value Date/Time   SDES URINE, RANDOM 07/18/2018 1145    SPECREQUEST NONE 07/18/2018 1145   CULT  07/18/2018 1145    NO GROWTH Performed at [facility], [place] [address], [place], [place] [address]    REPTSTATUS 07/19/2018 FINAL 07/18/2018 1145    Other culture-see note  Unresulted Labs (From admission, onward)    None     Data Reviewed: I have personally reviewed following labs and imaging studies CBC: Recent Labs  Lab 12/24/20 0940 12/25/20 0209 12/26/20 0404  WBC 9.2 8.1 10.8*  HGB 11.4* 11.2* 11.7*  HCT 38.5* 38.7* 38.6*  MCV 76.5* 75.6* 73.2*  PLT 273 280 644   Basic Metabolic Panel: Recent Labs  Lab 12/24/20 0940 12/25/20 0209 12/26/20 0404 12/27/20 0154 12/28/20 0217  NA 143 141 137 136 135  K 4.5 4.1 3.8 3.8 3.7  CL 113* 107 102 96* 95*  CO2 21* 25 24 28 29   GLUCOSE 93 96 97 105* 109*  BUN 23 26* 29* 38* 45*  CREATININE 2.19* 2.45*  2.31* 2.55* 2.58*  CALCIUM 8.8* 8.9 8.7* 8.8* 8.3*  MG 2.1  --   --   --   --    GFR: Estimated Creatinine Clearance: 23.4 mL/min (A) (by C-G formula based on SCr of 2.58 mg/dL (H)). Liver Function Tests: Recent Labs  Lab 12/26/20 0404  AST 28  ALT 19  ALKPHOS 92  BILITOT 0.7  PROT 5.4*  ALBUMIN 3.0*   No results for input(s): LIPASE, AMYLASE in the last 168 hours. No results for input(s): AMMONIA in the last 168 hours. Coagulation Profile: No results for input(s): INR, PROTIME in the last 168 hours. Cardiac Enzymes: No results for input(s): CKTOTAL, CKMB, CKMBINDEX, TROPONINI in the last 168 hours. BNP (last 3 results) Recent Labs    10/15/20 0841 10/27/20 0936 12/15/20 1127  PROBNP 5,768* 6,081* 18,617*   HbA1C: No results for input(s): HGBA1C in the last 72 hours. CBG: No results for input(s): GLUCAP in the last 168 hours. Lipid Profile: No results for input(s): CHOL, HDL, LDLCALC, TRIG, CHOLHDL, LDLDIRECT in the last 72 hours. Thyroid Function Tests: No results for input(s): TSH, T4TOTAL, FREET4, T3FREE, THYROIDAB in the last 72 hours. Anemia  Panel: No results for input(s): VITAMINB12, FOLATE, FERRITIN, TIBC, IRON, RETICCTPCT in the last 72 hours. Sepsis Labs: Recent Labs  Lab 12/26/20 0404  LATICACIDVEN 1.0    Recent Results (from the past 240 hour(s))  Resp Panel by RT-PCR (Flu A&B, Covid) Nasopharyngeal Swab     Status: None   Collection Time: 12/24/20  8:56 AM   Specimen: Nasopharyngeal Swab; Nasopharyngeal(NP) swabs in vial transport medium  Result Value Ref Range Status   SARS Coronavirus 2 by RT PCR NEGATIVE NEGATIVE Final    Comment: (NOTE) SARS-CoV-2 target nucleic acids are NOT DETECTED.  The SARS-CoV-2 RNA is generally detectable in upper respiratory specimens during the acute phase of infection. The lowest concentration of SARS-CoV-2 viral copies this assay can detect is 138 copies/mL. A negative result does not preclude SARS-Cov-2 infection and should not be used as the sole basis for treatment or other patient management decisions. A negative result may occur with  improper specimen collection/handling, submission of specimen other than nasopharyngeal swab, presence of viral mutation(s) within the areas targeted by this assay, and inadequate number of viral copies(<138 copies/mL). A negative result must be combined with clinical observations, patient history, and epidemiological information. The expected result is Negative.  Fact Sheet for Patients:  EntrepreneurPulse.com.au  Fact Sheet for Healthcare Providers:  IncredibleEmployment.be  This test is no t yet approved or cleared by the [place] FDA and  has been authorized for detection and/or diagnosis of SARS-CoV-2 by FDA under an Emergency Use Authorization (EUA). This EUA will remain  in effect (meaning this test can be used) for the duration of the COVID-19 declaration under Section 564(b)(1) of the Act, 21 U.S.C.section 360bbb-3(b)(1), unless the authorization is terminated  or revoked sooner.        Influenza A by PCR NEGATIVE NEGATIVE Final   Influenza B by PCR NEGATIVE NEGATIVE Final    Comment: (NOTE) The Xpert Xpress SARS-CoV-2/FLU/RSV plus assay is intended as an aid in the diagnosis of influenza from Nasopharyngeal swab specimens and should not be used as a sole basis for treatment. Nasal washings and aspirates are unacceptable for Xpert Xpress SARS-CoV-2/FLU/RSV testing.  Fact Sheet for Patients: EntrepreneurPulse.com.au  Fact Sheet for Healthcare Providers: IncredibleEmployment.be  This test is not yet approved or cleared by the [place] and has been authorized  for detection and/or diagnosis of SARS-CoV-2 by FDA under an Emergency Use Authorization (EUA). This EUA will remain in effect (meaning this test can be used) for the duration of the COVID-19 declaration under Section 564(b)(1) of the Act, 21 U.S.C. section 360bbb-3(b)(1), unless the authorization is terminated or revoked.  Performed at [facility], [place] [address] [place], [place] [address]      Radiology Studies: DG Foot 2 Views Left  Result Date: 12/27/2020 CLINICAL DATA:  Left foot pain. EXAM: LEFT FOOT - 2 VIEW COMPARISON:  None. FINDINGS: There is no evidence for acute fracture or dislocation. Joint spaces are well maintained. There is mild hallux valgus. Peripheral vascular calcifications are present. There are minimal soft tissue calcifications adjacent to the medial aspect of the first metatarsal head. No underlying erosive change. IMPRESSION: 1. No acute bony abnormality. 2. Mild hallux valgus. 3. Mild soft tissue calcifications adjacent to the first metatarsal head without underlying osseous abnormality. These are nonspecific. Gout not excluded. Electronically Signed   By: Ronney Asters M.D.   On: 12/27/2020 16:08     LOS: 3 days   Antonieta Pert, MD [facility]  12/28/2020, 9:28 AM

## 2020-12-28 NOTE — Plan of Care (Signed)
°  Problem: Clinical Measurements: Goal: Will remain free from infection Outcome: Progressing Goal: Diagnostic test results will improve Outcome: Progressing Goal: Respiratory complications will improve Outcome: Progressing   Problem: Pain Managment: Goal: General experience of comfort will improve Outcome: Progressing

## 2020-12-29 MED ORDER — SODIUM CHLORIDE 0.9 % IV SOLN: INTRAVENOUS | Status: DC

## 2020-12-29 MED ORDER — AMIODARONE HCL 200 MG PO TABS
400.0000 mg | ORAL_TABLET | Freq: Two times a day (BID) | ORAL | Status: DC
  Administered 2020-12-29 – 2020-12-30 (×3): 400 mg via ORAL
  Filled 2020-12-29 (×4): qty 2

## 2020-12-29 NOTE — Progress Notes (Addendum)
Received report on patient.  Amiodarone drip has been stopped since 0248 this morning due to PIV infiltration.  Cardiology has been notified by night RN.  Patient asymptomatic, HR 100s to 110s, aflutter with BBB.  No complaints offered at this time.

## 2020-12-29 NOTE — H&P (View-Only) (Signed)
PROGRESS NOTE  PENN GRISSETT QZR:[id number] DOB: 03-26-1933 DOA: 12/24/2020 PCP: [name], [name], MD   LOS: 4 days   Brief Narrative / Interim history: 87 year old male with history of hypertension, CKD 3B, chronic systolic CHF with EF 35-45%, a flutter with RVR, was scheduled to have DCCV on 12/16 but was fluid overloaded and he was admitted to the hospital.  He was placed on IV Lasix and cardiology has been consulted and following.  Subjective / 24h Interval events: No specific complaints this morning, denies any shortness of breath, chest pain or palpitations.  Assessment & Plan: Principal Problem Acute on chronic systolic CHF, worsened by the A-flutter with RVR-patient has received IV Lasix, net -10 L and his weight is down to 194 pounds from 210 on admission.  Management per cardiology, currently on furosemide, metoprolol  Active Problems A flutter with RVR-continue amiodarone, rate is fairly stable.  Continue Eliquis.  Plan for DCCV tomorrow  Essential hypertension-continue to monitor  Acute kidney injury on chronic kidney disease stage IIIb-Baseline 1.7-1.9, fluctuating.  Currently overall stable, 2.5 this morning  COPD-stable, no wheezing currently  Glaucoma-continue eyedrops  Moderate malnutrition-encourage p.o. intake  Left foot pain-suspect for gout flareup, continue allopurinol.  Improved  Scheduled Meds:  allopurinol  100 mg Oral Daily   amiodarone  400 mg Oral BID   amLODipine  5 mg Oral Daily   apixaban  2.5 mg Oral BID   docusate sodium  100 mg Oral BID   feeding supplement  237 mL Oral BID BM   furosemide  80 mg Intravenous Daily   latanoprost  1 drop Both Eyes QHS   metoprolol succinate  12.5 mg Oral Daily   mometasone-formoterol  2 puff Inhalation BID   multivitamin with minerals  1 tablet Oral Daily   pantoprazole  20 mg Oral Daily   Continuous Infusions: PRN Meds:.acetaminophen **OR** acetaminophen, bisacodyl, hydrALAZINE, morphine injection,  ondansetron **OR** ondansetron (ZOFRAN) IV, oxyCODONE, polyethylene glycol  Diet Orders (From admission, onward)     Start     Ordered   12/24/20 1015  Diet Heart Room service appropriate? Yes; Fluid consistency: Thin; Fluid restriction: 1500 mL Fluid  Diet effective now       Question Answer Comment  Room service appropriate? Yes   Fluid consistency: Thin   Fluid restriction: 1500 mL Fluid      12/24/20 1015            DVT prophylaxis: apixaban (ELIQUIS) tablet 2.5 mg Start: 12/24/20 2200 apixaban (ELIQUIS) tablet 2.5 mg     Code Status: Full Code  Family Communication: No family at bedside  Status is: Inpatient  Remains inpatient appropriate because: Ongoing cardiology work-up  Level of care: Telemetry Cardiac  Consultants:  Cardiology   Procedures:  none  Microbiology  none  Antimicrobials: none    Objective: Vitals:   12/29/20 0026 12/29/20 0030 12/29/20 0330 12/29/20 0832  BP: (!) 144/129 120/75 109/71 (!) 125/58  Pulse: (!) 102   (!) 56  Resp: 15  (!) 24 18  Temp: (!) 97.5 F (36.4 C)  98 F (36.7 C) 98.1 F (36.7 C)  TempSrc: Oral  Oral Oral  SpO2:  93% 95% 94%  Weight:   88.3 kg   Height:        Intake/Output Summary (Last 24 hours) at 12/29/2020 0921 Last data filed at 12/29/2020 0839 Gross per 24 hour  Intake 1060 ml  Output 1225 ml  Net -165 ml   [employer]  12/27/20 0325 12/28/20 0129 12/29/20 0330  Weight: 88 kg 88.5 kg 88.3 kg    Examination:  Constitutional: NAD Eyes: no scleral icterus ENMT: Mucous membranes are moist.  Neck: normal, supple Respiratory: clear to auscultation bilaterally, no wheezing, no crackles. Cardiovascular: Irregular, trace edema Abdomen: non distended, no tenderness. Bowel sounds positive.  Musculoskeletal: no clubbing / cyanosis.  Skin: no rashes Neurologic: CN 2-12 grossly intact. Strength 5/5 in all 4.    Data Reviewed: I have independently reviewed following labs and imaging  studies  CBC: Recent Labs  Lab 12/24/20 0940 12/25/20 0209 12/26/20 0404  WBC 9.2 8.1 10.8*  HGB 11.4* 11.2* 11.7*  HCT 38.5* 38.7* 38.6*  MCV 76.5* 75.6* 73.2*  PLT 273 280 644   Basic Metabolic Panel: Recent Labs  Lab 12/24/20 0940 12/25/20 0209 12/26/20 0404 12/27/20 0154 12/28/20 0217  NA 143 141 137 136 135  K 4.5 4.1 3.8 3.8 3.7  CL 113* 107 102 96* 95*  CO2 21* 25 24 28 29   GLUCOSE 93 96 97 105* 109*  BUN 23 26* 29* 38* 45*  CREATININE 2.19* 2.45* 2.31* 2.55* 2.58*  CALCIUM 8.8* 8.9 8.7* 8.8* 8.3*  MG 2.1  --   --   --   --    Liver Function Tests: Recent Labs  Lab 12/26/20 0404  AST 28  ALT 19  ALKPHOS 92  BILITOT 0.7  PROT 5.4*  ALBUMIN 3.0*   Coagulation Profile: No results for input(s): INR, PROTIME in the last 168 hours. HbA1C: No results for input(s): HGBA1C in the last 72 hours. CBG: No results for input(s): GLUCAP in the last 168 hours.  Recent Results (from the past 240 hour(s))  Resp Panel by RT-PCR (Flu A&B, Covid) Nasopharyngeal Swab     Status: None   Collection Time: 12/24/20  8:56 AM   Specimen: Nasopharyngeal Swab; Nasopharyngeal(NP) swabs in vial transport medium  Result Value Ref Range Status   SARS Coronavirus 2 by RT PCR NEGATIVE NEGATIVE Final    Comment: (NOTE) SARS-CoV-2 target nucleic acids are NOT DETECTED.  The SARS-CoV-2 RNA is generally detectable in upper respiratory specimens during the acute phase of infection. The lowest concentration of SARS-CoV-2 viral copies this assay can detect is 138 copies/mL. A negative result does not preclude SARS-Cov-2 infection and should not be used as the sole basis for treatment or other patient management decisions. A negative result may occur with  improper specimen collection/handling, submission of specimen other than nasopharyngeal swab, presence of viral mutation(s) within the areas targeted by this assay, and inadequate number of viral copies(<138 copies/mL). A negative  result must be combined with clinical observations, patient history, and epidemiological information. The expected result is Negative.  Fact Sheet for Patients:  EntrepreneurPulse.com.au  Fact Sheet for Healthcare Providers:  IncredibleEmployment.be  This test is no t yet approved or cleared by the [place] FDA and  has been authorized for detection and/or diagnosis of SARS-CoV-2 by FDA under an Emergency Use Authorization (EUA). This EUA will remain  in effect (meaning this test can be used) for the duration of the COVID-19 declaration under Section 564(b)(1) of the Act, 21 U.S.C.section 360bbb-3(b)(1), unless the authorization is terminated  or revoked sooner.       Influenza A by PCR NEGATIVE NEGATIVE Final   Influenza B by PCR NEGATIVE NEGATIVE Final    Comment: (NOTE) The Xpert Xpress SARS-CoV-2/FLU/RSV plus assay is intended as an aid in the diagnosis of influenza from Nasopharyngeal swab specimens and  should not be used as a sole basis for treatment. Nasal washings and aspirates are unacceptable for Xpert Xpress SARS-CoV-2/FLU/RSV testing.  Fact Sheet for Patients: EntrepreneurPulse.com.au  Fact Sheet for Healthcare Providers: IncredibleEmployment.be  This test is not yet approved or cleared by the [place] FDA and has been authorized for detection and/or diagnosis of SARS-CoV-2 by FDA under an Emergency Use Authorization (EUA). This EUA will remain in effect (meaning this test can be used) for the duration of the COVID-19 declaration under Section 564(b)(1) of the Act, 21 U.S.C. section 360bbb-3(b)(1), unless the authorization is terminated or revoked.  Performed at [facility], [place] [address], [place], [place] [address]      Radiology Studies: No results found.   Marzetta Board, MD, PhD [facility]  Between 7 am - 7 pm I am available, please contact me via  Amion (for emergencies) or Securechat (non urgent messages)  Between 7 pm - 7 am I am not available, please contact night coverage MD/APP via Amion

## 2020-12-29 NOTE — Progress Notes (Addendum)
Upon assessment, IV site red and painful to patient with infiltration. IV amiodarone stopped. IV removed, and pharmacy notified who stated to use warm compress. New iv started, iwatch placed on site.   2245-new iv site infilitrated. IV stopped. IV team consult placed.   0200-third iv site infiltrated as notified by iwatch. IV stopped and IV team notified per recommendation of MD to come up with additional site.    0230- Cardiology MD notified due to only options of access are PICC (unable to place tonight and no other indications for it) or another peripheral which would likely infiltrate again. MD stated to leave amio off and notify if heart rate started trending up.

## 2020-12-29 NOTE — Progress Notes (Signed)
Physical Therapy Treatment Patient Details Name: Nicholas [name] MRN: [id number] DOB: [birth year] Today's Date: 12/29/2020   History of Present Illness Pt is an 87 y/o male admitted 12/16 for cardioversion, however, cardioversion was cancelled as the pt was volume overloaded. PMH includes HTN, CKD, CHF, and a fib.    PT Comments    Pt dozing in bed on entry, daughter in room. Easily rouses and agreeable to ambulating with therapy. Pt is limited in safe mobility by mild gout pain in foot, with decreased balance and endurance. Pt is mod I for bed mobility, supervision for transfers and min guard for ambulation without AD. At end of ambulation pt reports that he would prefer to walk with this shoes in the future. PT is in agreement that it will likely improve his balance. D/c plans remain appropriate. PT will continue to follow acutely.   Recommendations for follow up therapy are one component of a multi-disciplinary discharge planning process, led by the attending physician.  Recommendations may be updated based on patient status, additional functional criteria and insurance authorization.  Follow Up Recommendations  Home health PT     Assistance Recommended at Discharge Intermittent Supervision/Assistance  Equipment Recommendations  Other (comment) (TBA)       Precautions / Restrictions Precautions Precautions: Fall Restrictions Weight Bearing Restrictions: No     Mobility  Bed Mobility   Bed Mobility: Supine to Sit     Supine to sit: Modified independent (Device/Increase time) Sit to supine: Modified independent (Device/Increase time)   General bed mobility comments: increased effort for bringing trunk to upright and for bringing LE back into bed after ambulation    Transfers Overall transfer level: Needs assistance Equipment used: None Transfers: Sit to/from Stand Sit to Stand: Supervision           General transfer comment: supervision for safety, good power up and  self steadying    Ambulation/Gait Ambulation/Gait assistance: Min guard Gait Distance (Feet): 150 Feet Assistive device: None Gait Pattern/deviations: Step-through pattern;Decreased stride length;Trunk flexed;Antalgic;Narrow base of support;Scissoring Gait velocity: Decreased Gait velocity interpretation: <1.31 ft/sec, indicative of household ambulator   General Gait Details: prefers to ambulate without AD, slowed slightly unsteady gait with occasional scissoring step, no overt LoB, pt reports at end of ambulation that he would prefer to walk with his shoes on         Balance Overall balance assessment: Needs assistance Sitting-balance support: No upper extremity supported;Feet supported Sitting balance-Leahy Scale: Good     Standing balance support: No upper extremity supported;During functional activity Standing balance-Leahy Scale: Fair                              Cognition Arousal/Alertness: Awake/alert Behavior During Therapy: WFL for tasks assessed/performed Overall Cognitive Status: Within Functional Limits for tasks assessed                                             General Comments General comments (skin integrity, edema, etc.): HR in high 110s with ambulation      Pertinent Vitals/Pain Pain Assessment: Faces Faces Pain Scale: Hurts a little bit Pain Location: L foot Pain Descriptors / Indicators: Discomfort Pain Intervention(s): Limited activity within patient's tolerance;Monitored during session;Repositioned     PT Goals (current goals can now be found in the care plan section) Acute  Rehab PT Goals PT Goal Formulation: With patient Time For Goal Achievement: 01/07/21 Potential to Achieve Goals: Good Progress towards PT goals: Progressing toward goals    Frequency    Min 3X/week      PT Plan Current plan remains appropriate       AM-PAC PT "6 Clicks" Mobility   Outcome Measure  Help needed turning from your  back to your side while in a flat bed without using bedrails?: None Help needed moving from lying on your back to sitting on the side of a flat bed without using bedrails?: None Help needed moving to and from a bed to a chair (including a wheelchair)?: A Little Help needed standing up from a chair using your arms (e.g., wheelchair or bedside chair)?: A Little Help needed to walk in hospital room?: A Little Help needed climbing 3-5 steps with a railing? : A Little 6 Click Score: 20    End of Session Equipment Utilized During Treatment: Gait belt Activity Tolerance: Patient tolerated treatment well Patient left: with family/visitor present;in bed;with call bell/phone within reach Nurse Communication: Mobility status PT Visit Diagnosis: Other abnormalities of gait and mobility (R26.89);Muscle weakness (generalized) (M62.81)     Time: 1540-0867 PT Time Calculation (min) (ACUTE ONLY): 15 min  Charges:  $Therapeutic Exercise: 8-22 mins                     Zohar Laing B. Migdalia Dk PT, DPT Acute Rehabilitation Services Pager [phone] Office [phone]    [place] 12/29/2020, 2:39 PM

## 2020-12-29 NOTE — Progress Notes (Signed)
Occupational Therapy Treatment Patient Details Name: Nicholas [name] MRN: [id number] DOB: Jan 06, 1934 Today's Date: 12/29/2020   History of present illness Pt is an 87 y/o male admitted 12/16 for cardioversion, however, cardioversion was cancelled as the pt was volume overloaded. PMH includes HTN, CKD, CHF, and a fib.   OT comments  Pt received in bathroom (had gotten to bathroom without assistance) on RA. Pt able to complete remainder of toileting task with Modified Independence and distant supervision for in-room mobility without AD. Pt noted with desat to 88% on RA, improved to 94% on 1 L O2. Provided energy conservation handout and emphasized rest breaks, breathing techniques, and prioritizing daily tasks. Pt verbalizes understanding and denies any current concerns.   HR up to 117bpm, 2/4 DOE   Recommendations for follow up therapy are one component of a multi-disciplinary discharge planning process, led by the attending physician.  Recommendations may be updated based on patient status, additional functional criteria and insurance authorization.    Follow Up Recommendations  Home health OT    Assistance Recommended at Discharge Intermittent Supervision/Assistance  Equipment Recommendations  Tub/shower seat    Recommendations for Other Services      Precautions / Restrictions Precautions Precautions: Fall Precaution Comments: monitor O2/HR Restrictions Weight Bearing Restrictions: No       Mobility Bed Mobility Overal bed mobility: Needs Assistance Bed Mobility: Sit to Supine       Sit to supine: Modified independent (Device/Increase time)        Transfers Overall transfer level: Independent Equipment used: None Transfers: Sit to/from Stand Sit to Stand: Independent                 Balance Overall balance assessment: Needs assistance Sitting-balance support: No upper extremity supported;Feet supported Sitting balance-Leahy Scale: Good     Standing  balance support: No upper extremity supported;During functional activity Standing balance-Leahy Scale: Good                             ADL either performed or assessed with clinical judgement   ADL Overall ADL's : Needs assistance/impaired     Grooming: Set up;Sitting;Wash/dry hands Grooming Details (indicate cue type and reason): cued to wash hands after exiting bathroom but pt HOH and did not hear. provided hand sanitizer sitting EOB             Lower Body Dressing: Set up;Sit to/from stand Lower Body Dressing Details (indicate cue type and reason): able to cross LEs to reach feet Toilet Transfer: Supervision/safety;Ambulation Toilet Transfer Details (indicate cue type and reason): no AD Toileting- Clothing Manipulation and Hygiene: Modified independent;Sit to/from stand       Functional mobility during ADLs: Supervision/safety General ADL Comments: provided energy conservation handout and education on strategies to implement (reports he has shower chair he can use). Pt received in bathroom, had disconnected pulse ox and removed O2, denies SOB but 2/4 DOE noted. Educa on need for 1 L O2 to maintain sats at this time    Extremity/Trunk Assessment Upper Extremity Assessment Upper Extremity Assessment: Overall WFL for tasks assessed   Lower Extremity Assessment Lower Extremity Assessment: Defer to PT evaluation        Vision   Vision Assessment?: No apparent visual deficits   Perception     Praxis      Cognition Arousal/Alertness: Awake/alert Behavior During Therapy: WFL for tasks assessed/performed Overall Cognitive Status: Within Functional Limits for tasks assessed  Exercises     Shoulder Instructions       General Comments HR 117bpm, SpO2 88% on RA with bathroom tasks, increased to 94% on 1 L O2    Pertinent Vitals/ Pain       Pain Assessment: No/denies pain  Home Living                                           Prior Functioning/Environment              Frequency  Min 2X/week        Progress Toward Goals  OT Goals(current goals can now be found in the care plan section)  Progress towards OT goals: Progressing toward goals  Acute Rehab OT Goals OT Goal Formulation: With patient Time For Goal Achievement: 01/07/21 Potential to Achieve Goals: Good ADL Goals Additional ADL Goal #1: Pt will generalize energy conservation strategies in ADL and mobility. Additional ADL Goal #2: Pt will perform self care modified independently.  Plan Discharge plan remains appropriate    Co-evaluation                 AM-PAC OT "6 Clicks" Daily Activity     Outcome Measure   Help from another person eating meals?: None Help from another person taking care of personal grooming?: A Little Help from another person toileting, which includes using toliet, bedpan, or urinal?: A Little Help from another person bathing (including washing, rinsing, drying)?: None Help from another person to put on and taking off regular upper body clothing?: A Little Help from another person to put on and taking off regular lower body clothing?: A Little 6 Click Score: 20    End of Session Equipment Utilized During Treatment: Oxygen  OT Visit Diagnosis: Unsteadiness on feet (R26.81);Other (comment)   Activity Tolerance Patient tolerated treatment well   Patient Left in bed;with call bell/phone within reach   Nurse Communication Mobility status        Time: 0807-0820 OT Time Calculation (min): 13 min  Charges: OT General Charges $OT Visit: 1 Visit OT Treatments $Self Care/Home Management : 8-22 mins  Malachy Chamber, OTR/L Acute Rehab Services Office: [phone]   Layla Maw 12/29/2020, 8:55 AM

## 2020-12-29 NOTE — Care Management Important Message (Signed)
Important Message  Patient Details  Name: [name] MRN: [id number] Date of Birth: Jan 20, 1933   Medicare Important Message Given:  Yes     [name] 12/29/2020, 9:14 AM

## 2020-12-29 NOTE — Progress Notes (Signed)
PROGRESS NOTE  Nicholas [name]:[id number] DOB: [birth year] DOA: 12/24/2020 PCP: [name], [name], MD   LOS: 4 days   Brief Narrative / Interim history: 87 year old male with history of hypertension, CKD 3B, chronic systolic CHF with EF 95-62%, a flutter with RVR, was scheduled to have DCCV on 12/16 but was fluid overloaded and he was admitted to the hospital.  He was placed on IV Lasix and cardiology has been consulted and following.  Subjective / 24h Interval events: No specific complaints this morning, denies any shortness of breath, chest pain or palpitations.  Assessment & Plan: Principal Problem Acute on chronic systolic CHF, worsened by the A-flutter with RVR-patient has received IV Lasix, net -10 L and his weight is down to 194 pounds from 210 on admission.  Management per cardiology, currently on furosemide, metoprolol  Active Problems A flutter with RVR-continue amiodarone, rate is fairly stable.  Continue Eliquis.  Plan for DCCV tomorrow  Essential hypertension-continue to monitor  Acute kidney injury on chronic kidney disease stage IIIb-Baseline 1.7-1.9, fluctuating.  Currently overall stable, 2.5 this morning  COPD-stable, no wheezing currently  Glaucoma-continue eyedrops  Moderate malnutrition-encourage p.o. intake  Left foot pain-suspect for gout flareup, continue allopurinol.  Improved  Scheduled Meds:  allopurinol  100 mg Oral Daily   amiodarone  400 mg Oral BID   amLODipine  5 mg Oral Daily   apixaban  2.5 mg Oral BID   docusate sodium  100 mg Oral BID   feeding supplement  237 mL Oral BID BM   furosemide  80 mg Intravenous Daily   latanoprost  1 drop Both Eyes QHS   metoprolol succinate  12.5 mg Oral Daily   mometasone-formoterol  2 puff Inhalation BID   multivitamin with minerals  1 tablet Oral Daily   pantoprazole  20 mg Oral Daily   Continuous Infusions: PRN Meds:.acetaminophen **OR** acetaminophen, bisacodyl, hydrALAZINE, morphine injection,  ondansetron **OR** ondansetron (ZOFRAN) IV, oxyCODONE, polyethylene glycol  Diet Orders (From admission, onward)     Start     Ordered   12/24/20 1015  Diet Heart Room service appropriate? Yes; Fluid consistency: Thin; Fluid restriction: 1500 mL Fluid  Diet effective now       Question Answer Comment  Room service appropriate? Yes   Fluid consistency: Thin   Fluid restriction: 1500 mL Fluid      12/24/20 1015            DVT prophylaxis: apixaban (ELIQUIS) tablet 2.5 mg Start: 12/24/20 2200 apixaban (ELIQUIS) tablet 2.5 mg     Code Status: Full Code  Family Communication: No family at bedside  Status is: Inpatient  Remains inpatient appropriate because: Ongoing cardiology work-up  Level of care: Telemetry Cardiac  Consultants:  Cardiology   Procedures:  none  Microbiology  none  Antimicrobials: none    Objective: Vitals:   12/29/20 0026 12/29/20 0030 12/29/20 0330 12/29/20 0832  BP: (!) 144/129 120/75 109/71 (!) 125/58  Pulse: (!) 102   (!) 56  Resp: 15  (!) 24 18  Temp: (!) 97.5 F (36.4 C)  98 F (36.7 C) 98.1 F (36.7 C)  TempSrc: Oral  Oral Oral  SpO2:  93% 95% 94%  Weight:   88.3 kg   Height:        Intake/Output Summary (Last 24 hours) at 12/29/2020 0921 Last data filed at 12/29/2020 0839 Gross per 24 hour  Intake 1060 ml  Output 1225 ml  Net -165 ml   [employer]  12/27/20 0325 12/28/20 0129 12/29/20 0330  Weight: 88 kg 88.5 kg 88.3 kg    Examination:  Constitutional: NAD Eyes: no scleral icterus ENMT: Mucous membranes are moist.  Neck: normal, supple Respiratory: clear to auscultation bilaterally, no wheezing, no crackles. Cardiovascular: Irregular, trace edema Abdomen: non distended, no tenderness. Bowel sounds positive.  Musculoskeletal: no clubbing / cyanosis.  Skin: no rashes Neurologic: CN 2-12 grossly intact. Strength 5/5 in all 4.    Data Reviewed: I have independently reviewed following labs and imaging  studies  CBC: Recent Labs  Lab 12/24/20 0940 12/25/20 0209 12/26/20 0404  WBC 9.2 8.1 10.8*  HGB 11.4* 11.2* 11.7*  HCT 38.5* 38.7* 38.6*  MCV 76.5* 75.6* 73.2*  PLT 273 280 703   Basic Metabolic Panel: Recent Labs  Lab 12/24/20 0940 12/25/20 0209 12/26/20 0404 12/27/20 0154 12/28/20 0217  NA 143 141 137 136 135  K 4.5 4.1 3.8 3.8 3.7  CL 113* 107 102 96* 95*  CO2 21* 25 24 28 29   GLUCOSE 93 96 97 105* 109*  BUN 23 26* 29* 38* 45*  CREATININE 2.19* 2.45* 2.31* 2.55* 2.58*  CALCIUM 8.8* 8.9 8.7* 8.8* 8.3*  MG 2.1  --   --   --   --    Liver Function Tests: Recent Labs  Lab 12/26/20 0404  AST 28  ALT 19  ALKPHOS 92  BILITOT 0.7  PROT 5.4*  ALBUMIN 3.0*   Coagulation Profile: No results for input(s): INR, PROTIME in the last 168 hours. HbA1C: No results for input(s): HGBA1C in the last 72 hours. CBG: No results for input(s): GLUCAP in the last 168 hours.  Recent Results (from the past 240 hour(s))  Resp Panel by RT-PCR (Flu A&B, Covid) Nasopharyngeal Swab     Status: None   Collection Time: 12/24/20  8:56 AM   Specimen: Nasopharyngeal Swab; Nasopharyngeal(NP) swabs in vial transport medium  Result Value Ref Range Status   SARS Coronavirus 2 by RT PCR NEGATIVE NEGATIVE Final    Comment: (NOTE) SARS-CoV-2 target nucleic acids are NOT DETECTED.  The SARS-CoV-2 RNA is generally detectable in upper respiratory specimens during the acute phase of infection. The lowest concentration of SARS-CoV-2 viral copies this assay can detect is 138 copies/mL. A negative result does not preclude SARS-Cov-2 infection and should not be used as the sole basis for treatment or other patient management decisions. A negative result may occur with  improper specimen collection/handling, submission of specimen other than nasopharyngeal swab, presence of viral mutation(s) within the areas targeted by this assay, and inadequate number of viral copies(<138 copies/mL). A negative  result must be combined with clinical observations, patient history, and epidemiological information. The expected result is Negative.  Fact Sheet for Patients:  EntrepreneurPulse.com.au  Fact Sheet for Healthcare Providers:  IncredibleEmployment.be  This test is no t yet approved or cleared by the [place] FDA and  has been authorized for detection and/or diagnosis of SARS-CoV-2 by FDA under an Emergency Use Authorization (EUA). This EUA will remain  in effect (meaning this test can be used) for the duration of the COVID-19 declaration under Section 564(b)(1) of the Act, 21 U.S.C.section 360bbb-3(b)(1), unless the authorization is terminated  or revoked sooner.       Influenza A by PCR NEGATIVE NEGATIVE Final   Influenza B by PCR NEGATIVE NEGATIVE Final    Comment: (NOTE) The Xpert Xpress SARS-CoV-2/FLU/RSV plus assay is intended as an aid in the diagnosis of influenza from Nasopharyngeal swab specimens and  should not be used as a sole basis for treatment. Nasal washings and aspirates are unacceptable for Xpert Xpress SARS-CoV-2/FLU/RSV testing.  Fact Sheet for Patients: EntrepreneurPulse.com.au  Fact Sheet for Healthcare Providers: IncredibleEmployment.be  This test is not yet approved or cleared by the [place] FDA and has been authorized for detection and/or diagnosis of SARS-CoV-2 by FDA under an Emergency Use Authorization (EUA). This EUA will remain in effect (meaning this test can be used) for the duration of the COVID-19 declaration under Section 564(b)(1) of the Act, 21 U.S.C. section 360bbb-3(b)(1), unless the authorization is terminated or revoked.  Performed at [facility], [place] [address], [place], [place] [address]      Radiology Studies: No results found.   Marzetta Board, MD, PhD [facility]  Between 7 am - 7 pm I am available, please contact me via  Amion (for emergencies) or Securechat (non urgent messages)  Between 7 pm - 7 am I am not available, please contact night coverage MD/APP via Amion

## 2020-12-29 NOTE — Progress Notes (Signed)
Progress Note  Patient Name: Nicholas [name] Date of Encounter: 12/29/2020  [facility] Cardiologist: Shirlee More, MD   Subjective   No chest pain or SOB IV infiltration sites stable.   Inpatient Medications    Scheduled Meds:  allopurinol  100 mg Oral Daily   amiodarone  150 mg Intravenous Once   amLODipine  5 mg Oral Daily   apixaban  2.5 mg Oral BID   docusate sodium  100 mg Oral BID   feeding supplement  237 mL Oral BID BM   furosemide  80 mg Intravenous Daily   latanoprost  1 drop Both Eyes QHS   metoprolol succinate  12.5 mg Oral Daily   mometasone-formoterol  2 puff Inhalation BID   multivitamin with minerals  1 tablet Oral Daily   pantoprazole  20 mg Oral Daily   Continuous Infusions:  amiodarone Stopped (12/29/20 0248)   PRN Meds: acetaminophen **OR** acetaminophen, bisacodyl, hydrALAZINE, morphine injection, ondansetron **OR** ondansetron (ZOFRAN) IV, oxyCODONE, polyethylene glycol   Vital Signs    Vitals:   12/28/20 2032 12/29/20 0026 12/29/20 0030 12/29/20 0330  BP:  (!) 144/129 120/75 109/71  Pulse:  (!) 102    Resp:  15  (!) 24  Temp:  (!) 97.5 F (36.4 C)  98 F (36.7 C)  TempSrc:  Oral  Oral  SpO2: 97%  93% 95%  Weight:    88.3 kg  Height:        Intake/Output Summary (Last 24 hours) at 12/29/2020 0706 Last data filed at 12/29/2020 0335 Gross per 24 hour  Intake 700 ml  Output 1225 ml  Net -525 ml   Last 3 Weights 12/29/2020 12/28/2020 12/27/2020  Weight (lbs) 194 lb 9.6 oz 195 lb 3.2 oz 194 lb  Weight (kg) 88.27 kg 88.542 kg 87.998 kg      Telemetry    Atrial flutter with freq PVCs - Personally Reviewed  ECG    No new - Personally Reviewed  Physical Exam   GEN: No acute distress.   Neck: No JVD sitting up in bed Cardiac: irreg irreg, no murmurs, rubs, or gallops.  Respiratory: Clear to diminished to auscultation bilaterally. GI: Soft, nontender, non-distended  MS: No edema; No deformity. Neuro:  Nonfocal  Psych:  Normal affect   Labs    High Sensitivity Troponin:  No results for input(s): TROPONINIHS in the last 720 hours.   Chemistry Recent Labs  Lab 12/24/20 0940 12/25/20 0209 12/26/20 0404 12/27/20 0154 12/28/20 0217  NA 143   < > 137 136 135  K 4.5   < > 3.8 3.8 3.7  CL 113*   < > 102 96* 95*  CO2 21*   < > 24 28 29   GLUCOSE 93   < > 97 105* 109*  BUN 23   < > 29* 38* 45*  CREATININE 2.19*   < > 2.31* 2.55* 2.58*  CALCIUM 8.8*   < > 8.7* 8.8* 8.3*  MG 2.1  --   --   --   --   PROT  --   --  5.4*  --   --   ALBUMIN  --   --  3.0*  --   --   AST  --   --  28  --   --   ALT  --   --  19  --   --   ALKPHOS  --   --  92  --   --  BILITOT  --   --  0.7  --   --   GFRNONAA 29*   < > 27* 24* 24*  ANIONGAP 9   < > 11 12 11    < > = values in this interval not displayed.    Lipids No results for input(s): CHOL, TRIG, HDL, LABVLDL, LDLCALC, CHOLHDL in the last 168 hours.  Hematology Recent Labs  Lab 12/24/20 0940 12/25/20 0209 12/26/20 0404  WBC 9.2 8.1 10.8*  RBC 5.03 5.12 5.27  HGB 11.4* 11.2* 11.7*  HCT 38.5* 38.7* 38.6*  MCV 76.5* 75.6* 73.2*  MCH 22.7* 21.9* 22.2*  MCHC 29.6* 28.9* 30.3  RDW 21.9* 21.8* 21.5*  PLT 273 280 308   Thyroid No results for input(s): TSH, FREET4 in the last 168 hours.  BNP Recent Labs  Lab 12/24/20 0941 12/26/20 0404  BNP 1,084.1* 1,213.1*    DDimer No results for input(s): DDIMER in the last 168 hours.   Radiology    DG Foot 2 Views Left  Result Date: 12/27/2020 CLINICAL DATA:  Left foot pain. EXAM: LEFT FOOT - 2 VIEW COMPARISON:  None. FINDINGS: There is no evidence for acute fracture or dislocation. Joint spaces are well maintained. There is mild hallux valgus. Peripheral vascular calcifications are present. There are minimal soft tissue calcifications adjacent to the medial aspect of the first metatarsal head. No underlying erosive change. IMPRESSION: 1. No acute bony abnormality. 2. Mild hallux valgus. 3. Mild soft tissue  calcifications adjacent to the first metatarsal head without underlying osseous abnormality. These are nonspecific. Gout not excluded. Electronically Signed   By: Ronney Asters M.D.   On: 12/27/2020 16:08    Cardiac Studies   Tte 10/2020 IMPRESSIONS   1. Rapid atrial fibrillation/ flutter. Left ventricular ejection  fraction, by estimation, is 40 to 45%. The left ventricle has mildly  decreased function. The left ventricle has no regional wall motion  abnormalities. There is moderate concentric left  ventricular hypertrophy. Left ventricular diastolic parameters are  indeterminate.   2. Right ventricular systolic function is normal. The right ventricular  size is normal. There is moderately elevated pulmonary artery systolic  pressure.   3. Left atrial size was moderately dilated.   4. The mitral valve is normal in structure. Mild mitral valve  regurgitation. No evidence of mitral stenosis.   5. The aortic valve is tricuspid. There is mild calcification of the  aortic valve. There is mild thickening of the aortic valve. Aortic valve  regurgitation is mild. Mild aortic valve stenosis.   6. There is moderate dilatation of the ascending aorta, measuring 39 mm.   7. The inferior vena cava is dilated in size with <50% respiratory  variability, suggesting right atrial pressure of 15 mmHg.   FINDINGS   Left Ventricle: Rapid atrial fibrillation/ flutter. Left ventricular  ejection fraction, by estimation, is 40 to 45%. The left ventricle has  mildly decreased function. The left ventricle has no regional wall motion  abnormalities. The left ventricular  internal cavity size was normal in size. There is moderate concentric left  ventricular hypertrophy. Left ventricular diastolic parameters are  indeterminate.   Right Ventricle: The right ventricular size is normal. No increase in  right ventricular wall thickness. Right ventricular systolic function is  normal. There is moderately elevated  pulmonary artery systolic pressure.  The tricuspid regurgitant velocity is  3.00 m/s, and with an assumed right atrial pressure of 15 mmHg, the  estimated right ventricular systolic pressure is 55.7 mmHg.  Left Atrium: Left atrial size was moderately dilated.   Right Atrium: Right atrial size was normal in size.   Pericardium: There is no evidence of pericardial effusion.   Mitral Valve: The mitral valve is normal in structure. Mild mitral annular  calcification. Mild mitral valve regurgitation. No evidence of mitral  valve stenosis.   Tricuspid Valve: The tricuspid valve is normal in structure. Tricuspid  valve regurgitation is trivial. No evidence of tricuspid stenosis.   Aortic Valve: The aortic valve is tricuspid. There is mild calcification  of the aortic valve. There is mild thickening of the aortic valve. Aortic  valve regurgitation is mild. Mild aortic stenosis is present. Aortic valve  mean gradient measures 17.0  mmHg. Aortic valve peak gradient measures 28.0 mmHg. Aortic valve area, by  VTI measures 1.82 cm.   Pulmonic Valve: The pulmonic valve was normal in structure. Pulmonic valve  regurgitation is not visualized. No evidence of pulmonic stenosis.   Aorta: The aortic root is normal in size and structure and the aortic arch  was not well visualized. There is moderate dilatation of the ascending  aorta, measuring 39 mm.   Venous: The pulmonary veins were not well visualized. The inferior vena  cava is dilated in size with less than 50% respiratory variability,  suggesting right atrial pressure of 15 mmHg.   IAS/Shunts: No atrial level shunt detected by color flow Doppler.   Patient Profile     87 y.o. male hx of HTN, CKD 3b, CHF, atrial flutter, bradycardia, anticoagulation and anemia, COPD and plan for DCCV today.  NPO.  Assessment & Plan    Atrial flutter RVR - unable to DCCV last week due to SOB and CHF/COPD admitted for diuresis -elevated Cr holding  entresto, spiro, farxiga  -for DCCV tomorrow at 0730 -amiodarone off due to IV access, on BB will change to po 400 mg BID unless MD has different orders -on eliquis -chronic RBBB  SOB/Acute CHF/COPD -neg 10,138 since admit and wt down from 95.3 Kg to 88.3 kg (loss of 15.4 lbs) -IV lasix 80 mg daily   AKI on chronic CKD 3b -pt see Dr. Justin Mend as outpt nephrologist -Cr 2.45>>2.31>>2.55>>2.58 (Cr 12/15/20 at 1.87)  IV access - IV out during the night and unable to re-place,    HTN  -BP controlled 120/75 to 109/71 on BB and amlodipine  Chronic anemia with last Hgb of 11.7 has rec'd IV iron in the past.       For questions or updates, please contact [facility] Please consult [email or web address] for contact info under        Signed, Cecilie Kicks, NP  12/29/2020, 7:06 AM

## 2020-12-30 ENCOUNTER — Inpatient Hospital Stay (HOSPITAL_COMMUNITY): Payer: PPO | Admitting: Certified Registered Nurse Anesthetist

## 2020-12-30 ENCOUNTER — Encounter (HOSPITAL_COMMUNITY)
Admission: RE | Disposition: A | Discharge status: Home / Self Care | Payer: Self-pay | Source: Home / Self Care | Attending: Internal Medicine

## 2020-12-30 ENCOUNTER — Encounter (HOSPITAL_COMMUNITY): Payer: Self-pay | Admitting: Internal Medicine

## 2020-12-30 DIAGNOSIS — I5043 Acute on chronic combined systolic (congestive) and diastolic (congestive) heart failure: Secondary | ICD-10-CM

## 2020-12-30 HISTORY — PX: CARDIOVERSION: SHX1299

## 2020-12-30 LAB — COMPREHENSIVE METABOLIC PANEL
ALT: 21 U/L (ref 0–44)
AST: 26 U/L (ref 15–41)
Albumin: 2.9 g/dL — ABNORMAL LOW (ref 3.5–5.0)
Alkaline Phosphatase: 83 U/L (ref 38–126)
Anion gap: 13 (ref 5–15)
BUN: 49 mg/dL — ABNORMAL HIGH (ref 8–23)
CO2: 25 mmol/L (ref 22–32)
Calcium: 8.3 mg/dL — ABNORMAL LOW (ref 8.9–10.3)
Chloride: 94 mmol/L — ABNORMAL LOW (ref 98–111)
Creatinine, Ser: 2.77 mg/dL — ABNORMAL HIGH (ref 0.61–1.24)
GFR, Estimated: 22 mL/min — ABNORMAL LOW (ref >60)
Glucose, Bld: 82 mg/dL (ref 70–99)
Potassium: 3.6 mmol/L (ref 3.5–5.1)
Sodium: 132 mmol/L — ABNORMAL LOW (ref 135–145)
Total Bilirubin: 0.7 mg/dL (ref 0.3–1.2)
Total Protein: 5.5 g/dL — ABNORMAL LOW (ref 6.5–8.1)

## 2020-12-30 LAB — CBC WITH DIFFERENTIAL/PLATELET
Abs Immature Granulocytes: 0.04 10*3/uL (ref 0.00–0.07)
Basophils Absolute: 0.1 10*3/uL (ref 0.0–0.1)
Basophils Relative: 1 %
Eosinophils Absolute: 0.1 10*3/uL (ref 0.0–0.5)
Eosinophils Relative: 1 %
HCT: 36.2 % — ABNORMAL LOW (ref 39.0–52.0)
Hemoglobin: 11 g/dL — ABNORMAL LOW (ref 13.0–17.0)
Immature Granulocytes: 0 %
Lymphocytes Relative: 25 %
Lymphs Abs: 2.2 10*3/uL (ref 0.7–4.0)
MCH: 22 pg — ABNORMAL LOW (ref 26.0–34.0)
MCHC: 30.4 g/dL (ref 30.0–36.0)
MCV: 72.5 fL — ABNORMAL LOW (ref 80.0–100.0)
Monocytes Absolute: 1.2 10*3/uL — ABNORMAL HIGH (ref 0.1–1.0)
Monocytes Relative: 14 %
Neutro Abs: 5.5 10*3/uL (ref 1.7–7.7)
Neutrophils Relative %: 59 %
Platelets: 255 10*3/uL (ref 150–400)
RBC: 4.99 MIL/uL (ref 4.22–5.81)
RDW: 20.6 % — ABNORMAL HIGH (ref 11.5–15.5)
WBC: 9.1 10*3/uL (ref 4.0–10.5)
nRBC: 0 % (ref 0.0–0.2)

## 2020-12-30 LAB — PROTIME-INR
INR: 1.5 — ABNORMAL HIGH (ref 0.8–1.2)
Prothrombin Time: 18.1 seconds — ABNORMAL HIGH (ref 11.4–15.2)

## 2020-12-30 SURGERY — CARDIOVERSION
Anesthesia: General

## 2020-12-30 MED ORDER — METOPROLOL SUCCINATE ER 25 MG PO TB24: 12.5000 mg | ORAL_TABLET | Freq: Every day | ORAL | 0 refills | Status: DC

## 2020-12-30 MED ORDER — FUROSEMIDE 20 MG PO TABS: 20.0000 mg | ORAL_TABLET | Freq: Every day | ORAL | 11 refills | Status: DC | PRN

## 2020-12-30 MED ORDER — PHENYLEPHRINE 40 MCG/ML (10ML) SYRINGE FOR IV PUSH (FOR BLOOD PRESSURE SUPPORT)
PREFILLED_SYRINGE | INTRAVENOUS | Status: DC | PRN
  Administered 2020-12-30: 80 ug via INTRAVENOUS

## 2020-12-30 MED ORDER — POTASSIUM CHLORIDE CRYS ER 20 MEQ PO TBCR: 20.0000 meq | EXTENDED_RELEASE_TABLET | Freq: Once | ORAL | Status: DC

## 2020-12-30 MED ORDER — AMIODARONE HCL 200 MG PO TABS: ORAL_TABLET | ORAL | 0 refills | Status: DC

## 2020-12-30 MED ORDER — PROPOFOL 10 MG/ML IV BOLUS
INTRAVENOUS | Status: DC | PRN
  Administered 2020-12-30: 70 mg via INTRAVENOUS

## 2020-12-30 MED ORDER — LIDOCAINE 2% (20 MG/ML) 5 ML SYRINGE
INTRAMUSCULAR | Status: DC | PRN
  Administered 2020-12-30: 80 mg via INTRAVENOUS

## 2020-12-30 NOTE — Anesthesia Preprocedure Evaluation (Signed)
Anesthesia Evaluation  Patient identified by MRN, date of birth, ID band Patient awake    Reviewed: Allergy & Precautions, NPO status , Patient's Chart, lab work & pertinent test results  History of Anesthesia Complications Negative for: history of anesthetic complications  Airway Mallampati: II  TM Distance: >3 FB Neck ROM: Full    Dental  (+) Upper Dentures, Lower Dentures   Pulmonary asthma , former smoker,    Pulmonary exam normal        Cardiovascular hypertension, +CHF  + dysrhythmias Atrial Fibrillation  Rhythm:Irregular Rate:Tachycardia   Echo 10/25/20: EF 40-45%, no RWMA, mod LVH, nl RVSF, mod pulm HTN, mod LAE, mild MR, mild AR, mild AS (MG 17, AVA 1.82), ascending aorta 39 mm   Neuro/Psych TIA   GI/Hepatic negative GI ROS, Neg liver ROS,   Endo/Other  negative endocrine ROS  Renal/GU CRFRenal disease (CKD, Cr 1.87)  negative genitourinary   Musculoskeletal  (+) Arthritis ,   Abdominal   Peds  Hematology  (+) anemia ,   Anesthesia Other Findings   Reproductive/Obstetrics                             Anesthesia Physical  Anesthesia Plan  ASA: 3  Anesthesia Plan: General   Post-op Pain Management: Minimal or no pain anticipated   Induction: Intravenous  PONV Risk Score and Plan: 2 and TIVA and Treatment may vary due to age or medical condition  Airway Management Planned: Mask  Additional Equipment: None  Intra-op Plan:   Post-operative Plan:   Informed Consent: I have reviewed the patients History and Physical, chart, labs and discussed the procedure including the risks, benefits and alternatives for the proposed anesthesia with the patient or authorized representative who has indicated his/her understanding and acceptance.       Plan Discussed with:   Anesthesia Plan Comments:         Anesthesia Quick Evaluation

## 2020-12-30 NOTE — Transfer of Care (Signed)
Immediate Anesthesia Transfer of Care Note  Patient: Nicholas [name]  Procedure(s) Performed: CARDIOVERSION  Patient Location: Endoscopy Unit  Anesthesia Type:General  Level of Consciousness: drowsy  Airway & Oxygen Therapy: Patient Spontanous Breathing and Patient connected to nasal cannula oxygen  Post-op Assessment: Report given to RN and Post -op Vital signs reviewed and stable  Post vital signs: Reviewed and stable  Last Vitals:  Vitals Value Taken Time  BP 92/52 12/30/20 0800  Temp    Pulse 81 12/30/20 0800  Resp 18 12/30/20 0800  SpO2 93 % 12/30/20 0800  Vitals shown include unvalidated device data.  Last Pain:  Vitals:   12/30/20 0700  TempSrc: Oral  PainSc: 0-No pain      Patients Stated Pain Goal: 0 (64/33/29 5188)  Complications: No notable events documented.

## 2020-12-30 NOTE — Progress Notes (Signed)
Heart Failure Nurse Navigator Progress Note  Education Assessment and Provision:  Detailed education and instructions provided on heart failure disease management including the following:  Signs and symptoms of Heart Failure When to call the physician Importance of daily weights Low sodium diet Fluid restriction Medication management Anticipated future follow-up appointments  Patient education given on each of the above topics.  Patient acknowledges understanding via teach back method and acceptance of all instructions.  Education Materials:  "Living Better With Heart Failure" Booklet, HF zone tool, & Daily Weight Tracker Tool.  Patient has scale at home: yes Patient has pill box at home: yes  Reminded of HV TOC clinic appt scheduled for 1/3. Family at beside. Lives at home with nephew. Takes medications as prescribed. Pt was independent prior to this illness, hopeful to get back to independence level at home. No issues with medication costs. Has transportation. DCCV today, currently in NSR. Possible DC today or tomorrow per cardiology.   HV TOC f/u: -optimize  Pricilla Holm, MSN, RN Heart Failure Nurse Navigator [phone]

## 2020-12-30 NOTE — Anesthesia Postprocedure Evaluation (Signed)
Anesthesia Post Note  Patient: Nicholas [name]  Procedure(s) Performed: CARDIOVERSION     Patient location during evaluation: Endoscopy Anesthesia Type: General Level of consciousness: awake and alert Pain management: pain level controlled Vital Signs Assessment: post-procedure vital signs reviewed and stable Respiratory status: spontaneous breathing, nonlabored ventilation and respiratory function stable Cardiovascular status: blood pressure returned to baseline and stable Postop Assessment: no apparent nausea or vomiting Anesthetic complications: no   No notable events documented.  Last Vitals:  Vitals:   12/30/20 0810 12/30/20 0820  BP:  104/64  Pulse:  85  Resp:  20  Temp:    SpO2: 97% 96%    Last Pain:  Vitals:   12/30/20 0820  TempSrc:   PainSc: 0-No pain                 [name]

## 2020-12-30 NOTE — Progress Notes (Addendum)
Progress Note  Patient Name: Nicholas [name] Date of Encounter: 12/30/2020  [facility] Cardiologist: Shirlee More, MD   Subjective   No chest pain and no SOB, up to BR and feeling much better  Inpatient Medications    Scheduled Meds:  allopurinol  100 mg Oral Daily   amiodarone  400 mg Oral BID   amLODipine  5 mg Oral Daily   apixaban  2.5 mg Oral BID   docusate sodium  100 mg Oral BID   feeding supplement  237 mL Oral BID BM   latanoprost  1 drop Both Eyes QHS   metoprolol succinate  12.5 mg Oral Daily   mometasone-formoterol  2 puff Inhalation BID   multivitamin with minerals  1 tablet Oral Daily   pantoprazole  20 mg Oral Daily   Continuous Infusions:  PRN Meds: acetaminophen **OR** acetaminophen, bisacodyl, hydrALAZINE, morphine injection, ondansetron **OR** ondansetron (ZOFRAN) IV, oxyCODONE, polyethylene glycol   Vital Signs    Vitals:   12/30/20 0800 12/30/20 0804 12/30/20 0810 12/30/20 0820  BP: (!) 92/52 (!) 97/56  104/64  Pulse: 82 81  85  Resp: 18 19  20   Temp:      TempSrc:      SpO2: 91% 93% 97% 96%  Weight:      Height:        Intake/Output Summary (Last 24 hours) at 12/30/2020 0925 Last data filed at 12/30/2020 0754 Gross per 24 hour  Intake 810 ml  Output 1600 ml  Net -790 ml   Last 3 Weights 12/30/2020 12/30/2020 12/29/2020  Weight (lbs) 189 lb 6 oz 189 lb 6.4 oz 194 lb 9.6 oz  Weight (kg) 85.9 kg 85.911 kg 88.27 kg      Telemetry    Atrial flutter until DCCV Now SR RBBB with PVCs and PACs - Personally Reviewed  ECG    Post DCCV EKG SR with borderline 1st degree AV block and RBBB and LPVB  - Personally Reviewed  Physical Exam   GEN: No acute distress.   Neck: No JVD sitting on side of bed Cardiac: RRR, no murmurs, rubs, or gallops.  Respiratory: Clear to diminished to auscultation bilaterally. GI: Soft, nontender, non-distended  MS: No edema; No deformity. Neuro:  Nonfocal  Psych: Normal affect   Labs    High  Sensitivity Troponin:  No results for input(s): TROPONINIHS in the last 720 hours.   Chemistry Recent Labs  Lab 12/24/20 0940 12/25/20 0209 12/26/20 0404 12/27/20 0154 12/28/20 0217 12/30/20 0200  NA 143   < > 137 136 135 132*  K 4.5   < > 3.8 3.8 3.7 3.6  CL 113*   < > 102 96* 95* 94*  CO2 21*   < > 24 28 29 25   GLUCOSE 93   < > 97 105* 109* 82  BUN 23   < > 29* 38* 45* 49*  CREATININE 2.19*   < > 2.31* 2.55* 2.58* 2.77*  CALCIUM 8.8*   < > 8.7* 8.8* 8.3* 8.3*  MG 2.1  --   --   --   --   --   PROT  --   --  5.4*  --   --  5.5*  ALBUMIN  --   --  3.0*  --   --  2.9*  AST  --   --  28  --   --  26  ALT  --   --  19  --   --  21  ALKPHOS  --   --  92  --   --  83  BILITOT  --   --  0.7  --   --  0.7  GFRNONAA 29*   < > 27* 24* 24* 22*  ANIONGAP 9   < > 11 12 11 13    < > = values in this interval not displayed.    Lipids No results for input(s): CHOL, TRIG, HDL, LABVLDL, LDLCALC, CHOLHDL in the last 168 hours.  Hematology Recent Labs  Lab 12/25/20 0209 12/26/20 0404 12/30/20 0200  WBC 8.1 10.8* 9.1  RBC 5.12 5.27 4.99  HGB 11.2* 11.7* 11.0*  HCT 38.7* 38.6* 36.2*  MCV 75.6* 73.2* 72.5*  MCH 21.9* 22.2* 22.0*  MCHC 28.9* 30.3 30.4  RDW 21.8* 21.5* 20.6*  PLT 280 308 255   Thyroid No results for input(s): TSH, FREET4 in the last 168 hours.  BNP Recent Labs  Lab 12/24/20 0941 12/26/20 0404  BNP 1,084.1* 1,213.1*    DDimer No results for input(s): DDIMER in the last 168 hours.   Radiology    No results found.  Cardiac Studies   Tte 10/2020 IMPRESSIONS   1. Rapid atrial fibrillation/ flutter. Left ventricular ejection  fraction, by estimation, is 40 to 45%. The left ventricle has mildly  decreased function. The left ventricle has no regional wall motion  abnormalities. There is moderate concentric left  ventricular hypertrophy. Left ventricular diastolic parameters are  indeterminate.   2. Right ventricular systolic function is normal. The right  ventricular  size is normal. There is moderately elevated pulmonary artery systolic  pressure.   3. Left atrial size was moderately dilated.   4. The mitral valve is normal in structure. Mild mitral valve  regurgitation. No evidence of mitral stenosis.   5. The aortic valve is tricuspid. There is mild calcification of the  aortic valve. There is mild thickening of the aortic valve. Aortic valve  regurgitation is mild. Mild aortic valve stenosis.   6. There is moderate dilatation of the ascending aorta, measuring 39 mm.   7. The inferior vena cava is dilated in size with <50% respiratory  variability, suggesting right atrial pressure of 15 mmHg.   FINDINGS   Left Ventricle: Rapid atrial fibrillation/ flutter. Left ventricular  ejection fraction, by estimation, is 40 to 45%. The left ventricle has  mildly decreased function. The left ventricle has no regional wall motion  abnormalities. The left ventricular  internal cavity size was normal in size. There is moderate concentric left  ventricular hypertrophy. Left ventricular diastolic parameters are  indeterminate.   Right Ventricle: The right ventricular size is normal. No increase in  right ventricular wall thickness. Right ventricular systolic function is  normal. There is moderately elevated pulmonary artery systolic pressure.  The tricuspid regurgitant velocity is  3.00 m/s, and with an assumed right atrial pressure of 15 mmHg, the  estimated right ventricular systolic pressure is 38.1 mmHg.   Left Atrium: Left atrial size was moderately dilated.   Right Atrium: Right atrial size was normal in size.   Pericardium: There is no evidence of pericardial effusion.   Mitral Valve: The mitral valve is normal in structure. Mild mitral annular  calcification. Mild mitral valve regurgitation. No evidence of mitral  valve stenosis.   Tricuspid Valve: The tricuspid valve is normal in structure. Tricuspid  valve regurgitation is trivial.  No evidence of tricuspid stenosis.   Aortic Valve: The aortic valve is tricuspid. There is mild  calcification  of the aortic valve. There is mild thickening of the aortic valve. Aortic  valve regurgitation is mild. Mild aortic stenosis is present. Aortic valve  mean gradient measures 17.0  mmHg. Aortic valve peak gradient measures 28.0 mmHg. Aortic valve area, by  VTI measures 1.82 cm.   Pulmonic Valve: The pulmonic valve was normal in structure. Pulmonic valve  regurgitation is not visualized. No evidence of pulmonic stenosis.   Aorta: The aortic root is normal in size and structure and the aortic arch  was not well visualized. There is moderate dilatation of the ascending  aorta, measuring 39 mm.   Venous: The pulmonary veins were not well visualized. The inferior vena  cava is dilated in size with less than 50% respiratory variability,  suggesting right atrial pressure of 15 mmHg.   IAS/Shunts: No atrial level shunt detected by color flow Doppler.   Patient Profile     87 y.o. male hx of HTN, CKD 3b, CHF, atrial flutter, bradycardia, anticoagulation and anemia, COPD and DCCV done today  Assessment & Plan    Atrial flutter RVR - unable to DCCV last week due to SOB and CHF/COPD admitted for diuresis -elevated Cr holding entresto, spiro, farxiga  -successful DCCV today -amiodarone off due to IV access, on BB changed amiodarone po 400 mg BID decreasing doses 400 BID for 1 week then 200 BID for 2 weeks? then 200 daily -on eliquis -chronic RBBB -? Discharge today or tomorrow has follow up    SOB/Acute CHF/COPD -neg 10,628 since admit and wt down from 85.9 Kg to 88.3 kg (loss of 15.4 lbs) -had been on IV lasix 80 mg daily now off with rising Cr - should be stable now in SR, may need as outpt. Has follow up with specialty HF clinic 01/11/21 and with Dr. Bettina Gavia 01/31/21   AKI on chronic CKD 3b -pt see Dr. Justin Mend as outpt nephrologist -Cr 2.45>>2.31>>2.55>>2.58 >> today 2.77(Cr  12/15/20 at 1.87)   IV access - amio now po has saline lock  HTN  -BP controlled 120/75 to 109/71 on BB and amlodipine   Chronic anemia with last Hgb of 11.0 has rec'd IV iron in the past.           For questions or updates, please contact [facility] Please consult [email or web address] for contact info under        Signed, Cecilie Kicks, NP  12/30/2020, 9:25 AM    History and all data above reviewed.  Patient examined.  I agree with the findings as above.   He might want to go home later today.  Has a stomach ache currently.  Breathing OK.  Status post DCCV.  The patient exam reveals COR:RRR  ,  Lungs: Clear  ,  Abd: Positive bowel sounds, no rebound no guarding, Ext No edema  .  All available labs, radiology testing, previous records reviewed. Agree with documented assessment and plan.   Atrial fib:  Now status post DCCV.  Acute diastolic HF:  I suspect that he will need a low dose Lasix at discharge.  I would send home with 20 mg PRN weight gain.    He has two follow up visits already scheduled.  Jeneen Rinks [facility]  11:38 AM  12/30/2020

## 2020-12-30 NOTE — Interval H&P Note (Signed)
History and Physical Interval Note:  12/30/2020 7:40 AM  Nicholas [name]  has presented today for surgery, with the diagnosis of AFIB.  The various methods of treatment have been discussed with the patient and family. After consideration of risks, benefits and other options for treatment, the patient has consented to  Procedure(s): CARDIOVERSION (N/A) as a surgical intervention.  The patient's history has been reviewed, patient examined, no change in status, stable for surgery.  I have reviewed the patient's chart and labs.  Questions were answered to the patient's satisfaction.     [employer]

## 2020-12-30 NOTE — Discharge Instructions (Signed)
Weigh daily if you have scales and record weight,  if you gain 3 pounds in a day or 5 pounds in a week call Dr. Joya Gaskins office  Low salt diet please to help keep fluid away.   First appt is with our specialty Heart failure clinic, important to keep.

## 2020-12-30 NOTE — Anesthesia Procedure Notes (Signed)
Procedure Name: General with mask airway Date/Time: 12/30/2020 7:46 AM Performed by: Reece Agar, CRNA Pre-anesthesia Checklist: Patient identified, Emergency Drugs available, Suction available, Patient being monitored and Timeout performed Patient Re-evaluated:Patient Re-evaluated prior to induction Oxygen Delivery Method: Ambu bag Preoxygenation: Pre-oxygenation with 100% oxygen Induction Type: IV induction Ventilation: Mask ventilation without difficulty

## 2020-12-30 NOTE — Discharge Summary (Signed)
Physician Discharge Summary  Nicholas [name] UVO:[id number] DOB: Nov 18, 1933 DOA: 12/24/2020  PCP: Emmaline Kluver, MD  Admit date: 12/24/2020 Discharge date: 12/30/2020  Admitted From: home Disposition:  home  Recommendations for Outpatient Follow-up:  Follow up with PCP in 1-2 weeks Please obtain BMP/CBC in one week Follow up with cardiology in 2-3 weeks  Home Health: none Equipment/Devices: none  Discharge Condition: stable CODE STATUS: Full code Diet recommendation: heart healthy  HPI: Per admitting MD, Nicholas [name] is a 87 y.o. male with medical history significant of HTN; stage 3b CKD; chronic systolic CHF; atrial flutter presenting with SOB.  He was sent to the hospital for cardioversion.  However, his has too much fluid for it to be effective.  Some DOE.  No cough.  No orthopnea.  +PND.  Weight was 194 -> 211.  Patient was seen in Endo for cardioversion and Dr. Johney Frame requested [facility] admission for volume overload.    Hospital Course / Discharge diagnoses: Principal Problem Acute on chronic systolic CHF, worsened by the A-flutter with RVR -patient was admitted to the hospital with significant fluid overload, placed on IV Lasix and cardiology consulted and following.  He is weight improved from 210 on admission to 189 on discharge, and he is net -11 L.  For his a flutter, he was placed on amiodarone and eventually went cardioversion on 12/22 by cardiology.  He converted to sinus rhythm.  He was cleared for discharge by cardiology, and will be sent home in stable condition.  He is to continue amiodarone 400 mg twice daily for the next 7 days, 200 twice daily for 2 weeks and then 200 mg daily.  His diltiazem has been discontinued.  He will be placed on furosemide on discharge, as needed.   Active Problems A flutter with RVR-see above, cardioverted to sinus Essential hypertension-continue to monitor, stable Acute kidney injury on chronic kidney disease stage IIIb-Baseline  1.7-1.9, fluctuating.  Slightly elevated at 2.7 due to aggressive diuresis.  We will eventually stabilized, recommend outpatient follow-up  COPD-stable, no wheezing currently Glaucoma-continue eyedrops Moderate malnutrition-encourage p.o. intake Left foot pain-suspect for gout flareup, continue allopurinol.  Improved    Sepsis ruled out   Discharge Instructions  Discharge Instructions     Amb Referral to HF Clinic   Complete by: As directed       Allergies as of 12/30/2020       Reactions   Penicillins Hives   Childhood allergy Has patient had a PCN reaction causing immediate rash, facial/tongue/throat swelling, SOB or lightheadedness with hypotension: Yes Has patient had a PCN reaction causing severe rash involving mucus membranes or skin necrosis: Yes Has patient had a PCN reaction that required hospitalization: Yes Has patient had a PCN reaction occurring within the last 10 years: No If all of the above answers are "NO", then may proceed with Cephalosporin use.        Medication List     STOP taking these medications    diltiazem 30 MG tablet Commonly known as: Cardizem       TAKE these medications    acetaminophen 500 MG tablet Commonly known as: TYLENOL Take 500-1,000 mg by mouth every 8 (eight) hours as needed (for pain or headaches).   albuterol 108 (90 Base) MCG/ACT inhaler Commonly known as: VENTOLIN HFA Inhale 2 puffs into the lungs every 4 (four) hours as needed for wheezing or shortness of breath.   allopurinol 100 MG tablet Commonly known as: ZYLOPRIM Take 100  mg by mouth daily.   amiodarone 200 MG tablet Commonly known as: PACERONE 400 mg twice daily for 7 days then 200 mg twice daily for 14 days then 200 mg daily   amLODipine 5 MG tablet Commonly known as: NORVASC Take 5 mg by mouth daily.   apixaban 2.5 MG Tabs tablet Commonly known as: ELIQUIS Take 1 tablet (2.5 mg total) by mouth 2 (two) times daily.   CALCIUM 600+D3 PO Take 2  tablets by mouth daily.   Fish Oil 1000 MG Caps Take 1,000 mg by mouth daily.   Fluticasone-Salmeterol 250-50 MCG/DOSE Aepb Commonly known as: ADVAIR Inhale 1 puff into the lungs 2 (two) times daily.   furosemide 20 MG tablet Commonly known as: Lasix Take 1 tablet (20 mg total) by mouth daily as needed for fluid or edema.   latanoprost 0.005 % ophthalmic solution Commonly known as: XALATAN Place 1 drop into both eyes at bedtime.   metoprolol succinate 25 MG 24 hr tablet Commonly known as: TOPROL-XL Take 0.5 tablets (12.5 mg total) by mouth daily. Start taking on: December 31, 2020   multivitamin with minerals Tabs tablet Take 1 tablet by mouth every morning.   pantoprazole 20 MG tablet Commonly known as: Protonix Take 1 tablet (20 mg total) by mouth daily.        Follow-up Information     [facility]. Go to.   Specialty: Cardiology Why: Tuesday, January 3 @ 11AM for [facility] within [place].  Bring all medications with you. FREE valet parking at [employer], off [address]. Contact information: [address] [id number] [place]        Richardo Priest, MD Follow up on 01/31/2021.   Specialties: Cardiology, Radiology Why: at 2:40 pm Contact information: [place] [address] [phone]                 Consultations: Cardiology  Procedures/Studies:  DG CHEST PORT 1 VIEW  Result Date: 12/24/2020 CLINICAL DATA:  Acute on chronic combined systolic and diastolic CHF. EXAM: PORTABLE CHEST 1 VIEW COMPARISON:  09/20/2020 FINDINGS: Single-view of the chest demonstrates densities at the right lung base which could be associated with volume loss or even pleural fluid. Heart is enlarged. Left lung is clear without overt pulmonary edema. Right shoulder replacement. IMPRESSION: 1. Right basilar chest densities. Findings could represent  volume loss and/or pleural fluid. 2. Cardiomegaly without pulmonary edema. Electronically Signed   By: Markus Daft M.D.   On: 12/24/2020 11:24   DG Foot 2 Views Left  Result Date: 12/27/2020 CLINICAL DATA:  Left foot pain. EXAM: LEFT FOOT - 2 VIEW COMPARISON:  None. FINDINGS: There is no evidence for acute fracture or dislocation. Joint spaces are well maintained. There is mild hallux valgus. Peripheral vascular calcifications are present. There are minimal soft tissue calcifications adjacent to the medial aspect of the first metatarsal head. No underlying erosive change. IMPRESSION: 1. No acute bony abnormality. 2. Mild hallux valgus. 3. Mild soft tissue calcifications adjacent to the first metatarsal head without underlying osseous abnormality. These are nonspecific. Gout not excluded. Electronically Signed   By: Ronney Asters M.D.   On: 12/27/2020 16:08     Subjective:  Discharge Exam: BP 122/75    Pulse 85    Temp 98.3 F (36.8 C) (Oral)    Resp 20    Ht 5\' 10"  (1.778 m)  Wt 85.9 kg    SpO2 93%    BMI 27.17 kg/m   General: Pt is alert, awake, not in acute distress Cardiovascular: RRR, S1/S2 +, no rubs, no gallops Respiratory: CTA bilaterally, no wheezing, no rhonchi Abdominal: Soft, NT, ND, bowel sounds + Extremities: no edema, no cyanosis    The results of significant diagnostics from this hospitalization (including imaging, microbiology, ancillary and laboratory) are listed below for reference.     Microbiology: Recent Results (from the past 240 hour(s))  Resp Panel by RT-PCR (Flu A&B, Covid) Nasopharyngeal Swab     Status: None   Collection Time: 12/24/20  8:56 AM   Specimen: Nasopharyngeal Swab; Nasopharyngeal(NP) swabs in vial transport medium  Result Value Ref Range Status   SARS Coronavirus 2 by RT PCR NEGATIVE NEGATIVE Final    Comment: (NOTE) SARS-CoV-2 target nucleic acids are NOT DETECTED.  The SARS-CoV-2 RNA is generally detectable in upper respiratory specimens  during the acute phase of infection. The lowest concentration of SARS-CoV-2 viral copies this assay can detect is 138 copies/mL. A negative result does not preclude SARS-Cov-2 infection and should not be used as the sole basis for treatment or other patient management decisions. A negative result may occur with  improper specimen collection/handling, submission of specimen other than nasopharyngeal swab, presence of viral mutation(s) within the areas targeted by this assay, and inadequate number of viral copies(<138 copies/mL). A negative result must be combined with clinical observations, patient history, and epidemiological information. The expected result is Negative.  Fact Sheet for Patients:  EntrepreneurPulse.com.au  Fact Sheet for Healthcare Providers:  IncredibleEmployment.be  This test is no t yet approved or cleared by the [place] FDA and  has been authorized for detection and/or diagnosis of SARS-CoV-2 by FDA under an Emergency Use Authorization (EUA). This EUA will remain  in effect (meaning this test can be used) for the duration of the COVID-19 declaration under Section 564(b)(1) of the Act, 21 U.S.C.section 360bbb-3(b)(1), unless the authorization is terminated  or revoked sooner.       Influenza A by PCR NEGATIVE NEGATIVE Final   Influenza B by PCR NEGATIVE NEGATIVE Final    Comment: (NOTE) The Xpert Xpress SARS-CoV-2/FLU/RSV plus assay is intended as an aid in the diagnosis of influenza from Nasopharyngeal swab specimens and should not be used as a sole basis for treatment. Nasal washings and aspirates are unacceptable for Xpert Xpress SARS-CoV-2/FLU/RSV testing.  Fact Sheet for Patients: EntrepreneurPulse.com.au  Fact Sheet for Healthcare Providers: IncredibleEmployment.be  This test is not yet approved or cleared by the [place] FDA and has been authorized for detection  and/or diagnosis of SARS-CoV-2 by FDA under an Emergency Use Authorization (EUA). This EUA will remain in effect (meaning this test can be used) for the duration of the COVID-19 declaration under Section 564(b)(1) of the Act, 21 U.S.C. section 360bbb-3(b)(1), unless the authorization is terminated or revoked.  Performed at [facility], [place] [address], [place], [place] [address]      Labs: Basic Metabolic Panel: Recent Labs  Lab 12/24/20 0940 12/25/20 0209 12/26/20 0404 12/27/20 0154 12/28/20 0217 12/30/20 0200  NA 143 141 137 136 135 132*  K 4.5 4.1 3.8 3.8 3.7 3.6  CL 113* 107 102 96* 95* 94*  CO2 21* 25 24 28 29 25   GLUCOSE 93 96 97 105* 109* 82  BUN 23 26* 29* 38* 45* 49*  CREATININE 2.19* 2.45* 2.31* 2.55* 2.58* 2.77*  CALCIUM 8.8* 8.9 8.7* 8.8* 8.3*  8.3*  MG 2.1  --   --   --   --   --    Liver Function Tests: Recent Labs  Lab 12/26/20 0404 12/30/20 0200  AST 28 26  ALT 19 21  ALKPHOS 92 83  BILITOT 0.7 0.7  PROT 5.4* 5.5*  ALBUMIN 3.0* 2.9*   CBC: Recent Labs  Lab 12/24/20 0940 12/25/20 0209 12/26/20 0404 12/30/20 0200  WBC 9.2 8.1 10.8* 9.1  NEUTROABS  --   --   --  5.5  HGB 11.4* 11.2* 11.7* 11.0*  HCT 38.5* 38.7* 38.6* 36.2*  MCV 76.5* 75.6* 73.2* 72.5*  PLT 273 280 308 255   CBG: No results for input(s): GLUCAP in the last 168 hours. Hgb A1c No results for input(s): HGBA1C in the last 72 hours. Lipid Profile No results for input(s): CHOL, HDL, LDLCALC, TRIG, CHOLHDL, LDLDIRECT in the last 72 hours. Thyroid function studies No results for input(s): TSH, T4TOTAL, T3FREE, THYROIDAB in the last 72 hours.  Invalid input(s): FREET3 Urinalysis    Component Value Date/Time   COLORURINE YELLOW 07/18/2018 [place] 07/18/2018 1144   LABSPEC 1.019 07/18/2018 1144   PHURINE 5.0 07/18/2018 1144   GLUCOSEU NEGATIVE 07/18/2018 1144   HGBUR NEGATIVE 07/18/2018 1144   [place] 07/18/2018 [place] 07/18/2018 1144   PROTEINUR 100 (A) 07/18/2018 1144   NITRITE NEGATIVE 07/18/2018 1144   LEUKOCYTESUR NEGATIVE 07/18/2018 1144    FURTHER DISCHARGE INSTRUCTIONS:   Get Medicines reviewed and adjusted: Please take all your medications with you for your next visit with your Primary MD   Laboratory/radiological data: Please request your Primary MD to go over all hospital tests and procedure/radiological results at the follow up, please ask your Primary MD to get all Hospital records sent to his/her office.   In some cases, they will be blood work, cultures and biopsy results pending at the time of your discharge. Please request that your primary care M.D. goes through all the records of your hospital data and follows up on these results.   Also Note the following: If you experience worsening of your admission symptoms, develop shortness of breath, life threatening emergency, suicidal or homicidal thoughts you must seek medical attention immediately by calling 911 or calling your MD immediately  if symptoms less severe.   You must read complete instructions/literature along with all the possible adverse reactions/side effects for all the Medicines you take and that have been prescribed to you. Take any new Medicines after you have completely understood and accpet all the possible adverse reactions/side effects.    Do not drive when taking Pain medications or sleeping medications (Benzodaizepines)   Do not take more than prescribed Pain, Sleep and Anxiety Medications. It is not advisable to combine anxiety,sleep and pain medications without talking with your primary care practitioner   Special Instructions: If you have smoked or chewed Tobacco  in the last 2 yrs please stop smoking, stop any regular Alcohol  and or any Recreational drug use.   Wear Seat belts while driving.   Please note: You were cared for by a hospitalist during your hospital stay. Once you are discharged, your  primary care physician will handle any further medical issues. Please note that NO REFILLS for any discharge medications will be authorized once you are discharged, as it is imperative that you return to your primary care physician (or establish a relationship with a primary care physician if you do not have  one) for your post hospital discharge needs so that they can reassess your need for medications and monitor your lab values.  Time coordinating discharge: 40 minutes  SIGNED:  Marzetta Board, MD, PhD 12/30/2020, 1:59 PM

## 2020-12-30 NOTE — Progress Notes (Signed)
Discharge instructions (including medications) discussed with and copy provided to patient/caregivers. All belongings sent with patient.

## 2020-12-30 NOTE — CV Procedure (Signed)
° ° °  Electrical Cardioversion Procedure Note Nicholas [name] [id number] [birth year]  Procedure: Electrical Cardioversion Indications:  Atrial Flutter  Time Out: Verified patient identification, verified procedure,medications/allergies/relevent history reviewed, required imaging and test results available.  Performed  Procedure Details  The patient was NPO after midnight. Anesthesia was administered at the beside  by Dr.Miller with 70mg  of propofol.  Cardioversion was performed with synchronized biphasic defibrillation via AP pads with 100 joules.  1 attempt(s) were performed.  The patient converted to normal sinus rhythm. The patient tolerated the procedure well   IMPRESSION:  Successful cardioversion of atrial flutter    [name] 12/30/2020, 7:53 AM

## 2021-01-03 ENCOUNTER — Encounter (HOSPITAL_COMMUNITY): Payer: Self-pay | Admitting: Cardiology

## 2021-01-07 DIAGNOSIS — H401132 Primary open-angle glaucoma, bilateral, moderate stage: Secondary | ICD-10-CM | POA: Diagnosis not present

## 2021-01-07 DIAGNOSIS — Z961 Presence of intraocular lens: Secondary | ICD-10-CM | POA: Diagnosis not present

## 2021-01-10 NOTE — Progress Notes (Signed)
HEART & VASCULAR TRANSITION OF CARE CONSULT NOTE     Referring Physician:Dr Gherge  Primary Care: Dr [name]  Primary Cardiologist: Dr Bettina Gavia  Nephrology: Dr Justin Mend GI : Dr Paulita Fujita Hematology: Dr Bobby Rumpf  HPI: Referred to clinic by Dr Renne Crigler for heart failure consultation.   Nicholas [name] is a 88 year old with history of CKD Stage IIIB, A flutter, HFmEF, HTN, TIA, IDA, and gout. He has not had an ischemic work up.   He was first noted to be in A fib on 09/22/20. Referred to Dr Bettina Gavia for A fib. Cardioversion was delayed due to lanemia.   Saw Hematology on 11/04/20. Iron sats 4%. Had 2 iron transfusions. GI follow up was recommended.   Presented for scheduled cardioversion on 12/24/20 but procedure cancelled due to volume overload. Diuresed with IV lasix and loaded on amio . Had successful DC-CV on 12/30/20. Echo completed--->EF down from 55-60% to 40-45%. Suspect EF lower given A flutter RVR. Amio taper was in place. Discharged on 12/22. Discharge weight 191 pounds.   Overall feeling fine but last night he had some shortness of breath. Fatigued after activities. + Orthopnea. Denies PND. No bleeding issues. Appetite ok. No fever or chills. Weight at home 193-195 pounds. Taking all medications. Lives with his son. His son has been managing his medications.   Cardiac Testing  Echo 12/2020 EF 40-45%  Echo 2020 EF 55-60%  Echo 2019 Ef 50-55%   Review of Systems: [y] = yes, [ ]  = no   General: Weight gain [ ] ; Weight loss [ ] ; Anorexia [ ] ; Fatigue [ Y]; Fever [ ] ; Chills [ ] ; Weakness [ Y]  Cardiac: Chest pain/pressure [ ] ; Resting SOB [ ] ; Exertional SOB [ Y]; Orthopnea [Y ]; Pedal Edema [ ] ; Palpitations [ ] ; Syncope [ ] ; Presyncope [ ] ; Paroxysmal nocturnal dyspnea[ ]   Pulmonary: Cough [ ] ; Wheezing[ ] ; Hemoptysis[ ] ; Sputum [ ] ; Snoring [ ]   GI: Vomiting[ ] ; Dysphagia[ ] ; Melena[ ] ; Hematochezia [ ] ; Heartburn[ ] ; Abdominal pain [ ] ; Constipation [ ] ; Diarrhea [ ] ; BRBPR [ ]   GU:  Hematuria[ ] ; Dysuria [ ] ; Nocturia[ ]   Vascular: Pain in legs with walking [ ] ; Pain in feet with lying flat [ ] ; Non-healing sores [ ] ; Stroke [ ] ; TIA [ Y]; Slurred speech [ ] ;  Neuro: Headaches[ ] ; Vertigo[ ] ; Seizures[ ] ; Paresthesias[ ] ;Blurred vision [ ] ; Diplopia [ ] ; Vision changes [ ]   Ortho/Skin: Arthritis [ ] ; Joint pain [ Y]; Muscle pain [ ] ; Joint swelling [ ] ; Back Pain [ ] ; Rash [ ]   Psych: Depression[ ] ; Anxiety[ ]   Heme: Bleeding problems [ ] ; Clotting disorders [ ] ; Anemia [Y ]  Endocrine: Diabetes [ ] ; Thyroid dysfunction[ ]    Past Medical History:  Diagnosis Date   Anemia    none recent   Arthritis    Asthma    Chronic kidney disease    ckd stage 3   Family history of adverse reaction to anesthesia    daughter has ponv    Glaucoma    Hypertension     Current Outpatient Medications  Medication Sig Dispense Refill   acetaminophen (TYLENOL) 500 MG tablet Take 500-1,000 mg by mouth every 8 (eight) hours as needed (for pain or headaches).      albuterol (PROVENTIL HFA;VENTOLIN HFA) 108 (90 Base) MCG/ACT inhaler Inhale 2 puffs into the lungs every 4 (four) hours as needed for wheezing or shortness of breath.  allopurinol (ZYLOPRIM) 100 MG tablet Take 100 mg by mouth daily.     amiodarone (PACERONE) 200 MG tablet 400 mg twice daily for 7 days then 200 mg twice daily for 14 days then 200 mg daily 86 tablet 0   amLODipine (NORVASC) 5 MG tablet Take 5 mg by mouth daily.     apixaban (ELIQUIS) 2.5 MG TABS tablet Take 1 tablet (2.5 mg total) by mouth 2 (two) times daily. 60 tablet 0   Calcium Carb-Cholecalciferol (CALCIUM 600+D3 PO) Take 2 tablets by mouth daily.     Fluticasone-Salmeterol (ADVAIR) 250-50 MCG/DOSE AEPB Inhale 1 puff into the lungs 2 (two) times daily.     furosemide (LASIX) 20 MG tablet Take 1 tablet (20 mg total) by mouth daily as needed for fluid or edema. 30 tablet 11   latanoprost (XALATAN) 0.005 % ophthalmic solution Place 1 drop into both eyes at  bedtime.     metoprolol succinate (TOPROL-XL) 25 MG 24 hr tablet Take 0.5 tablets (12.5 mg total) by mouth daily. 30 tablet 0   Multiple Vitamin (MULTIVITAMIN WITH MINERALS) TABS tablet Take 1 tablet by mouth every morning.     Omega-3 Fatty Acids (FISH OIL) 1000 MG CAPS Take 1,000 mg by mouth daily.     pantoprazole (PROTONIX) 20 MG tablet Take 1 tablet (20 mg total) by mouth daily. 30 tablet 3   No current facility-administered medications for this encounter.    Allergies  Allergen Reactions   Penicillins Hives    Childhood allergy Has patient had a PCN reaction causing immediate rash, facial/tongue/throat swelling, SOB or lightheadedness with hypotension: Yes Has patient had a PCN reaction causing severe rash involving mucus membranes or skin necrosis: Yes Has patient had a PCN reaction that required hospitalization: Yes Has patient had a PCN reaction occurring within the last 10 years: No If all of the above answers are "NO", then may proceed with Cephalosporin use.       Social History   Socioeconomic History   Marital status: Widowed    Spouse name: Not on file   Number of children: 5   Years of education: 8   Highest education level: Not on file  Occupational History   Occupation: Retired   Tobacco Use   Smoking status: Former    Packs/day: 1.00    Years: 30.00    Pack years: 30.00    Types: Cigarettes   Smokeless tobacco: Never   Tobacco comments:    quit 35 yrs ago  Substance and Sexual Activity   Alcohol use: No   Drug use: No   Sexual activity: Not on file  Other Topics Concern   Not on file  Social History Narrative   11/28/18 Lives with grandson, [name]    Caffeine use: daily   Right handed    Social Determinants of Health   Financial Resource Strain: Low Risk    Difficulty of Paying Living Expenses: Not very hard  Food Insecurity: No Food Insecurity   Worried About Charity fundraiser in the Last Year: Never true   Ran Out of Food in the Last  Year: Never true  Transportation Needs: No Transportation Needs   Lack of Transportation (Medical): No   Lack of Transportation (Non-Medical): No  Physical Activity: Not on file  Stress: Not on file  Social Connections: Not on file  Intimate Partner Violence: Not on file      Family History  Problem Relation Age of Onset   Hypertension Mother  Hypertension Father     Vitals:   01/11/21 1038  BP: 110/82  Pulse: (!) 104  SpO2: 98%  Weight: 89.6 kg (197 lb 9.6 oz)   Wt Readings from Last 3 Encounters:  01/11/21 89.6 kg (197 lb 9.6 oz)  12/30/20 85.9 kg (189 lb 6 oz)  12/15/20 95.3 kg (210 lb)    PHYSICAL EXAM: General:  Walked in the clinic. No respiratory difficulty HEENT: normal Neck: supple. JVP 8-9 . Carotids 2+ bilat; no bruits. No lymphadenopathy or thryomegaly appreciated. Cor: PMI nondisplaced. Irregular rate & rhythm. No rubs, gallops or murmurs. Lungs: clear Abdomen: soft, nontender, nondistended. No hepatosplenomegaly. No bruits or masses. Good bowel sounds. Extremities: no cyanosis, clubbing, rash, edema Neuro: alert & oriented x 3, cranial nerves grossly intact. moves all 4 extremities w/o difficulty. Affect pleasant.  ECG: A Fib 98 bpm personally reviewed.    ASSESSMENT & PLAN: HFmEF -NICM suspect tachy-mediated but he has not had an ischemic work up. Could consider myoview.  - Echo 12/2020 EF 40-45% which is down from previous 55-60%.  -NYHA III. Volume status elevated. Will need to increase lasix to 40 mg daily for the next 3 days then back to as needed.  -BB-Continue Toprol XL 12.5 mg daily  -Ace/ARB/ARNI- no elevated creatinine -MRA-- no elevated creatinine  -SGLT2i- Could consider jardiance but with CKD Stage IV hold off.  - Check BMET today.    2. PAF -S/P DC-CV 12/2020 --> NSR -EKG today -->Back in A fib 98 bpm . Developed orthopnea last night. I wonder if he converted back to A fib last night. Due to recent decompensation will set up for  DC-CV later this week.  -Increase Amio 400 mg twice a day  - Continue eliquis 2.5 mg twice a day. Watch with anemia ? Watchman would be a consideration.   3. HTN  - Stable  4. CKD Stage IV -Creatinine baseline 2.4-2.6 - Check BMET - Followed by Dr Justin Mend  5. IDA -He was seen by Hematology 11/04/20 for anemia.  -Iron Sats 4%. Had Iron transfusions x2 - Recommendations for GI follow up.   Check  CBC and BMET   Referred to HFSW (PCP, Medications, Transportation, ETOH Abuse, Drug Abuse, Insurance, Financial ):  No Refer to Pharmacy:  No Refer to Home Health:  No Refer to Advanced Heart Failure Clinic: No as needed with Dr Haroldine Laws.   Refer to General Cardiology: He is established with Dr Bettina Gavia and has follow up later this month  Follow up  as needed with Advanced Heart Failure Clinic.   Discussed with Dr Haroldine Laws. See below.  Nicholas Clegg NP-C  12:04 PM   Patient seen and examined with the above-signed Advanced Practice Provider and/or Housestaff. I personally reviewed laboratory data, imaging studies and relevant notes. I independently examined the patient and formulated the important aspects of the plan. I have edited the note to reflect any of my changes or salient points. I have personally discussed the plan with the patient and/or family.  88 y/o male with HTN, CKD IV. Recently admitted with systolic HF in setting of PAF. Underwent DC-CV with amio support. Referred to [facility] for post-hospital f/u.   Developed recurrent symptoms last night. Back in AF this morning with volume overload.   General:  Elderly male. No resp difficulty HEENT: normal Neck: supple. JVP to jaw . Carotids 2+ bilat; no bruits. No lymphadenopathy or thryomegaly appreciated. Cor: PMI nondisplaced. Irregular rate & rhythm. No rubs, gallops or murmurs. Lungs:  clear Abdomen: soft, nontender, nondistended. No hepatosplenomegaly. No bruits or masses. Good bowel sounds. Extremities: no cyanosis, clubbing,  rash, 2+ edema Neuro: alert & orientedx3, cranial nerves grossly intact. moves all 4 extremities w/o difficulty. Affect pleasant  He is not tolerating AF well and now with recurrent HF. Will increase lasix. Will aslo increase amio. Plan for repeat DC-CV this week. Discussed with him and his son. Will need f/u in HF Clinic with [place].   Nicholas Bickers, MD  11:49 PM   .

## 2021-01-10 NOTE — H&P (View-Only) (Signed)
HEART & VASCULAR TRANSITION OF CARE CONSULT NOTE     Referring Physician:Dr Gherge  Primary Care: Dr [name]  Primary Cardiologist: Dr Bettina Gavia  Nephrology: Dr Justin Mend GI : Dr Paulita Fujita Hematology: Dr Bobby Rumpf  HPI: Referred to clinic by Dr Nicholas [name] for heart failure consultation.   [name] is a 88 year old with history of CKD Stage IIIB, A flutter, HFmEF, HTN, TIA, IDA, and gout. He has not had an ischemic work up.   He was first noted to be in A fib on 09/22/20. Referred to Dr Bettina Gavia for A fib. Cardioversion was delayed due to lanemia.   Saw Hematology on 11/04/20. Iron sats 4%. Had 2 iron transfusions. GI follow up was recommended.   Presented for scheduled cardioversion on 12/24/20 but procedure cancelled due to volume overload. Diuresed with IV lasix and loaded on amio . Had successful DC-CV on 12/30/20. Echo completed--->EF down from 55-60% to 40-45%. Suspect EF lower given A flutter RVR. Amio taper was in place. Discharged on 12/22. Discharge weight 191 pounds.   Overall feeling fine but last night he had some shortness of breath. Fatigued after activities. + Orthopnea. Denies PND. No bleeding issues. Appetite ok. No fever or chills. Weight at home 193-195 pounds. Taking all medications. Lives with his son. His son has been managing his medications.   Cardiac Testing  Echo 12/2020 EF 40-45%  Echo 2020 EF 55-60%  Echo 2019 Ef 50-55%   Review of Systems: [y] = yes, [ ]  = no   General: Weight gain [ ] ; Weight loss [ ] ; Anorexia [ ] ; Fatigue [ Y]; Fever [ ] ; Chills [ ] ; Weakness [ Y]  Cardiac: Chest pain/pressure [ ] ; Resting SOB [ ] ; Exertional SOB [ Y]; Orthopnea [Y ]; Pedal Edema [ ] ; Palpitations [ ] ; Syncope [ ] ; Presyncope [ ] ; Paroxysmal nocturnal dyspnea[ ]   Pulmonary: Cough [ ] ; Wheezing[ ] ; Hemoptysis[ ] ; Sputum [ ] ; Snoring [ ]   GI: Vomiting[ ] ; Dysphagia[ ] ; Melena[ ] ; Hematochezia [ ] ; Heartburn[ ] ; Abdominal pain [ ] ; Constipation [ ] ; Diarrhea [ ] ; BRBPR [ ]   GU:  Hematuria[ ] ; Dysuria [ ] ; Nocturia[ ]   Vascular: Pain in legs with walking [ ] ; Pain in feet with lying flat [ ] ; Non-healing sores [ ] ; Stroke [ ] ; TIA [ Y]; Slurred speech [ ] ;  Neuro: Headaches[ ] ; Vertigo[ ] ; Seizures[ ] ; Paresthesias[ ] ;Blurred vision [ ] ; Diplopia [ ] ; Vision changes [ ]   Ortho/Skin: Arthritis [ ] ; Joint pain [ Y]; Muscle pain [ ] ; Joint swelling [ ] ; Back Pain [ ] ; Rash [ ]   Psych: Depression[ ] ; Anxiety[ ]   Heme: Bleeding problems [ ] ; Clotting disorders [ ] ; Anemia [Y ]  Endocrine: Diabetes [ ] ; Thyroid dysfunction[ ]    Past Medical History:  Diagnosis Date   Anemia    none recent   Arthritis    Asthma    Chronic kidney disease    ckd stage 3   Family history of adverse reaction to anesthesia    daughter has ponv    Glaucoma    Hypertension     Current Outpatient Medications  Medication Sig Dispense Refill   acetaminophen (TYLENOL) 500 MG tablet Take 500-1,000 mg by mouth every 8 (eight) hours as needed (for pain or headaches).      albuterol (PROVENTIL HFA;VENTOLIN HFA) 108 (90 Base) MCG/ACT inhaler Inhale 2 puffs into the lungs every 4 (four) hours as needed for wheezing or shortness of breath.  allopurinol (ZYLOPRIM) 100 MG tablet Take 100 mg by mouth daily.     amiodarone (PACERONE) 200 MG tablet 400 mg twice daily for 7 days then 200 mg twice daily for 14 days then 200 mg daily 86 tablet 0   amLODipine (NORVASC) 5 MG tablet Take 5 mg by mouth daily.     apixaban (ELIQUIS) 2.5 MG TABS tablet Take 1 tablet (2.5 mg total) by mouth 2 (two) times daily. 60 tablet 0   Calcium Carb-Cholecalciferol (CALCIUM 600+D3 PO) Take 2 tablets by mouth daily.     Fluticasone-Salmeterol (ADVAIR) 250-50 MCG/DOSE AEPB Inhale 1 puff into the lungs 2 (two) times daily.     furosemide (LASIX) 20 MG tablet Take 1 tablet (20 mg total) by mouth daily as needed for fluid or edema. 30 tablet 11   latanoprost (XALATAN) 0.005 % ophthalmic solution Place 1 drop into both eyes at  bedtime.     metoprolol succinate (TOPROL-XL) 25 MG 24 hr tablet Take 0.5 tablets (12.5 mg total) by mouth daily. 30 tablet 0   Multiple Vitamin (MULTIVITAMIN WITH MINERALS) TABS tablet Take 1 tablet by mouth every morning.     Omega-3 Fatty Acids (FISH OIL) 1000 MG CAPS Take 1,000 mg by mouth daily.     pantoprazole (PROTONIX) 20 MG tablet Take 1 tablet (20 mg total) by mouth daily. 30 tablet 3   No current facility-administered medications for this encounter.    Allergies  Allergen Reactions   Penicillins Hives    Childhood allergy Has patient had a PCN reaction causing immediate rash, facial/tongue/throat swelling, SOB or lightheadedness with hypotension: Yes Has patient had a PCN reaction causing severe rash involving mucus membranes or skin necrosis: Yes Has patient had a PCN reaction that required hospitalization: Yes Has patient had a PCN reaction occurring within the last 10 years: No If all of the above answers are "NO", then may proceed with Cephalosporin use.       Social History   Socioeconomic History   Marital status: Widowed    Spouse name: Not on file   Number of children: 5   Years of education: 8   Highest education level: Not on file  Occupational History   Occupation: Retired   Tobacco Use   Smoking status: Former    Packs/day: 1.00    Years: 30.00    Pack years: 30.00    Types: Cigarettes   Smokeless tobacco: Never   Tobacco comments:    quit 35 yrs ago  Substance and Sexual Activity   Alcohol use: No   Drug use: No   Sexual activity: Not on file  Other Topics Concern   Not on file  Social History Narrative   11/28/18 Lives with grandson, [name]    Caffeine use: daily   Right handed    Social Determinants of Health   Financial Resource Strain: Low Risk    Difficulty of Paying Living Expenses: Not very hard  Food Insecurity: No Food Insecurity   Worried About Charity fundraiser in the Last Year: Never true   Ran Out of Food in the Last  Year: Never true  Transportation Needs: No Transportation Needs   Lack of Transportation (Medical): No   Lack of Transportation (Non-Medical): No  Physical Activity: Not on file  Stress: Not on file  Social Connections: Not on file  Intimate Partner Violence: Not on file      Family History  Problem Relation Age of Onset   Hypertension Mother  Hypertension Father     Vitals:   01/11/21 1038  BP: 110/82  Pulse: (!) 104  SpO2: 98%  Weight: 89.6 kg (197 lb 9.6 oz)   Wt Readings from Last 3 Encounters:  01/11/21 89.6 kg (197 lb 9.6 oz)  12/30/20 85.9 kg (189 lb 6 oz)  12/15/20 95.3 kg (210 lb)    PHYSICAL EXAM: General:  Walked in the clinic. No respiratory difficulty HEENT: normal Neck: supple. JVP 8-9 . Carotids 2+ bilat; no bruits. No lymphadenopathy or thryomegaly appreciated. Cor: PMI nondisplaced. Irregular rate & rhythm. No rubs, gallops or murmurs. Lungs: clear Abdomen: soft, nontender, nondistended. No hepatosplenomegaly. No bruits or masses. Good bowel sounds. Extremities: no cyanosis, clubbing, rash, edema Neuro: alert & oriented x 3, cranial nerves grossly intact. moves all 4 extremities w/o difficulty. Affect pleasant.  ECG: A Fib 98 bpm personally reviewed.    ASSESSMENT & PLAN: HFmEF -NICM suspect tachy-mediated but he has not had an ischemic work up. Could consider myoview.  - Echo 12/2020 EF 40-45% which is down from previous 55-60%.  -NYHA III. Volume status elevated. Will need to increase lasix to 40 mg daily for the next 3 days then back to as needed.  -BB-Continue Toprol XL 12.5 mg daily  -Ace/ARB/ARNI- no elevated creatinine -MRA-- no elevated creatinine  -SGLT2i- Could consider jardiance but with CKD Stage IV hold off.  - Check BMET today.    2. PAF -S/P DC-CV 12/2020 --> NSR -EKG today -->Back in A fib 98 bpm . Developed orthopnea last night. I wonder if he converted back to A fib last night. Due to recent decompensation will set up for  DC-CV later this week.  -Increase Amio 400 mg twice a day  - Continue eliquis 2.5 mg twice a day. Watch with anemia ? Watchman would be a consideration.   3. HTN  - Stable  4. CKD Stage IV -Creatinine baseline 2.4-2.6 - Check BMET - Followed by Dr Justin Mend  5. IDA -He was seen by Hematology 11/04/20 for anemia.  -Iron Sats 4%. Had Iron transfusions x2 - Recommendations for GI follow up.   Check  CBC and BMET   Referred to HFSW (PCP, Medications, Transportation, ETOH Abuse, Drug Abuse, Insurance, Financial ):  No Refer to Pharmacy:  No Refer to Home Health:  No Refer to Advanced Heart Failure Clinic: No as needed with Dr Haroldine Laws.   Refer to General Cardiology: He is established with Dr Bettina Gavia and has follow up later this month  Follow up  as needed with Advanced Heart Failure Clinic.   Discussed with Dr Haroldine Laws. See below.  Amy Clegg NP-C  12:04 PM   Patient seen and examined with the above-signed Advanced Practice Provider and/or Housestaff. I personally reviewed laboratory data, imaging studies and relevant notes. I independently examined the patient and formulated the important aspects of the plan. I have edited the note to reflect any of my changes or salient points. I have personally discussed the plan with the patient and/or family.  88 y/o male with HTN, CKD IV. Recently admitted with systolic HF in setting of PAF. Underwent DC-CV with amio support. Referred to [facility] for post-hospital f/u.   Developed recurrent symptoms last night. Back in AF this morning with volume overload.   General:  Elderly male. No resp difficulty HEENT: normal Neck: supple. JVP to jaw . Carotids 2+ bilat; no bruits. No lymphadenopathy or thryomegaly appreciated. Cor: PMI nondisplaced. Irregular rate & rhythm. No rubs, gallops or murmurs. Lungs:  clear Abdomen: soft, nontender, nondistended. No hepatosplenomegaly. No bruits or masses. Good bowel sounds. Extremities: no cyanosis, clubbing,  rash, 2+ edema Neuro: alert & orientedx3, cranial nerves grossly intact. moves all 4 extremities w/o difficulty. Affect pleasant  He is not tolerating AF well and now with recurrent HF. Will increase lasix. Will aslo increase amio. Plan for repeat DC-CV this week. Discussed with him and his son. Will need f/u in HF Clinic with [place].   Glori Bickers, MD  11:49 PM   .

## 2021-01-11 ENCOUNTER — Telehealth (HOSPITAL_COMMUNITY): Payer: Self-pay

## 2021-01-11 ENCOUNTER — Encounter (HOSPITAL_COMMUNITY): Payer: Self-pay

## 2021-01-11 ENCOUNTER — Other Ambulatory Visit: Payer: Self-pay

## 2021-01-11 ENCOUNTER — Ambulatory Visit (HOSPITAL_COMMUNITY)
Admit: 2021-01-11 | Discharge: 2021-01-11 | Disposition: A | Discharge status: Home / Self Care | Payer: PPO | Attending: Adult Health | Admitting: Adult Health

## 2021-01-11 VITALS — BP 110/82 | HR 104 | Wt 197.6 lb

## 2021-01-11 DIAGNOSIS — D508 Other iron deficiency anemias: Secondary | ICD-10-CM

## 2021-01-11 DIAGNOSIS — I130 Hypertensive heart and chronic kidney disease with heart failure and stage 1 through stage 4 chronic kidney disease, or unspecified chronic kidney disease: Secondary | ICD-10-CM | POA: Diagnosis not present

## 2021-01-11 DIAGNOSIS — Z8673 Personal history of transient ischemic attack (TIA), and cerebral infarction without residual deficits: Secondary | ICD-10-CM | POA: Insufficient documentation

## 2021-01-11 DIAGNOSIS — Z7901 Long term (current) use of anticoagulants: Secondary | ICD-10-CM | POA: Insufficient documentation

## 2021-01-11 DIAGNOSIS — I4892 Unspecified atrial flutter: Secondary | ICD-10-CM | POA: Diagnosis not present

## 2021-01-11 DIAGNOSIS — I428 Other cardiomyopathies: Secondary | ICD-10-CM | POA: Diagnosis not present

## 2021-01-11 DIAGNOSIS — Z79899 Other long term (current) drug therapy: Secondary | ICD-10-CM | POA: Insufficient documentation

## 2021-01-11 DIAGNOSIS — I480 Paroxysmal atrial fibrillation: Secondary | ICD-10-CM

## 2021-01-11 DIAGNOSIS — I5022 Chronic systolic (congestive) heart failure: Secondary | ICD-10-CM | POA: Diagnosis not present

## 2021-01-11 DIAGNOSIS — I5030 Unspecified diastolic (congestive) heart failure: Secondary | ICD-10-CM

## 2021-01-11 DIAGNOSIS — M109 Gout, unspecified: Secondary | ICD-10-CM | POA: Insufficient documentation

## 2021-01-11 DIAGNOSIS — D509 Iron deficiency anemia, unspecified: Secondary | ICD-10-CM | POA: Diagnosis not present

## 2021-01-11 DIAGNOSIS — N184 Chronic kidney disease, stage 4 (severe): Secondary | ICD-10-CM

## 2021-01-11 DIAGNOSIS — I4510 Unspecified right bundle-branch block: Secondary | ICD-10-CM | POA: Insufficient documentation

## 2021-01-11 DIAGNOSIS — I445 Left posterior fascicular block: Secondary | ICD-10-CM | POA: Insufficient documentation

## 2021-01-11 DIAGNOSIS — N1832 Chronic kidney disease, stage 3b: Secondary | ICD-10-CM

## 2021-01-11 LAB — CBC
HCT: 38.6 % — ABNORMAL LOW (ref 39.0–52.0)
Hemoglobin: 11.5 g/dL — ABNORMAL LOW (ref 13.0–17.0)
MCH: 22.3 pg — ABNORMAL LOW (ref 26.0–34.0)
MCHC: 29.8 g/dL — ABNORMAL LOW (ref 30.0–36.0)
MCV: 75 fL — ABNORMAL LOW (ref 80.0–100.0)
Platelets: 394 10*3/uL (ref 150–400)
RBC: 5.15 MIL/uL (ref 4.22–5.81)
RDW: 21.4 % — ABNORMAL HIGH (ref 11.5–15.5)
WBC: 9.1 10*3/uL (ref 4.0–10.5)
nRBC: 0 % (ref 0.0–0.2)

## 2021-01-11 LAB — BASIC METABOLIC PANEL
Anion gap: 7 (ref 5–15)
BUN: 26 mg/dL — ABNORMAL HIGH (ref 8–23)
CO2: 24 mmol/L (ref 22–32)
Calcium: 8.7 mg/dL — ABNORMAL LOW (ref 8.9–10.3)
Chloride: 110 mmol/L (ref 98–111)
Creatinine, Ser: 2.14 mg/dL — ABNORMAL HIGH (ref 0.61–1.24)
GFR, Estimated: 30 mL/min — ABNORMAL LOW (ref >60)
Glucose, Bld: 89 mg/dL (ref 70–99)
Potassium: 4.8 mmol/L (ref 3.5–5.1)
Sodium: 141 mmol/L (ref 135–145)

## 2021-01-11 MED ORDER — FUROSEMIDE 20 MG PO TABS: ORAL_TABLET | ORAL | 11 refills | Status: DC

## 2021-01-11 MED ORDER — AMIODARONE HCL 200 MG PO TABS: 400.0000 mg | ORAL_TABLET | Freq: Two times a day (BID) | ORAL | 1 refills | Status: DC

## 2021-01-11 MED ORDER — FUROSEMIDE 20 MG PO TABS: 20.0000 mg | ORAL_TABLET | Freq: Every day | ORAL | 11 refills | Status: DC

## 2021-01-11 NOTE — Telephone Encounter (Signed)
Called to confirm Heart & Vascular Transitions of Care appointment at 11AM today. Patient reminded to bring all medications and pill box organizer with them. Confirmed patient has transportation. Gave directions, instructed to utilize valet parking.  Confirmed appointment prior to ending call.   Laterrance Nauta, MSN, RN Heart Failure Nurse Navigator [phone]  

## 2021-01-11 NOTE — Patient Instructions (Addendum)
INCREASE Amiodarone to 400 mg twice a day CHANGE Lasix to 40 mg daily for 3 days then resume normal dose of 20 mg daily thereafter   Labs today We will only contact you if something comes back abnormal or we need to make some changes. Otherwise no news is good news!  Your physician recommends that you schedule a follow-up appointment in: as needed with Dr Haroldine Laws   Do the following things EVERYDAY: Weigh yourself in the morning before breakfast. Write it down and keep it in a log. Take your medicines as prescribed Eat low salt foods--Limit salt (sodium) to 2000 mg per day.  Stay as active as you can everyday Limit all fluids for the day to less than 2 liters  Your physician has requested that you have a TEE. During a TEE, sound waves are used to create images of your heart. It provides your doctor with information about the size and shape of your heart and how well your hearts chambers and valves are working. In this test, a transducer is attached to the end of a flexible tube thats guided down your throat and into your esophagus (the tube leading from you mouth to your stomach) to get a more detailed image of your heart. You are not awake for the procedure. Please see the instruction sheet given to you today. For further information please visit [email or web address].          You are scheduled for a TEE/Cardioversion/TEE Cardioversion on Friday January 6,2023 with Dr. Haroldine Laws.  Please arrive at the [facility] (Main Entrance A) at [facility]: [address] [place], [place] [address] at 630 am.  DIET: Nothing to eat or drink after midnight except a sip of water with medications (see medication instructions below)  Medication Instructions: Hold lasix  Continue your anticoagulant: eliquis You will need to continue your anticoagulant after your procedure until you  are told by your  Provider that it is safe to stop   Labs: pre procedure labs done 01/11/2021  You  must have a responsible person to drive you home and stay in the waiting area during your procedure. Failure to do so could result in cancellation.  Bring your insurance cards.  *Special Note: Every effort is made to have your procedure done on time. Occasionally there are emergencies that occur at the hospital that may cause delays. Please be patient if a delay does occur.

## 2021-01-13 ENCOUNTER — Other Ambulatory Visit (HOSPITAL_COMMUNITY): Payer: Self-pay | Admitting: *Deleted

## 2021-01-13 DIAGNOSIS — I480 Paroxysmal atrial fibrillation: Secondary | ICD-10-CM

## 2021-01-14 ENCOUNTER — Encounter (HOSPITAL_COMMUNITY): Payer: Self-pay | Admitting: Internal Medicine

## 2021-01-14 ENCOUNTER — Ambulatory Visit (HOSPITAL_COMMUNITY): Payer: PPO | Admitting: Anesthesiology

## 2021-01-14 ENCOUNTER — Encounter (HOSPITAL_COMMUNITY)
Admission: RE | Disposition: A | Discharge status: Home / Self Care | Payer: Self-pay | Source: Home / Self Care | Attending: Internal Medicine

## 2021-01-14 ENCOUNTER — Other Ambulatory Visit: Payer: Self-pay

## 2021-01-14 ENCOUNTER — Ambulatory Visit (HOSPITAL_COMMUNITY)
Admission: RE | Admit: 2021-01-14 | Discharge: 2021-01-14 | Disposition: A | Discharge status: Home / Self Care | Payer: PPO | Attending: Internal Medicine | Admitting: Internal Medicine

## 2021-01-14 DIAGNOSIS — I4510 Unspecified right bundle-branch block: Secondary | ICD-10-CM | POA: Diagnosis not present

## 2021-01-14 DIAGNOSIS — I480 Paroxysmal atrial fibrillation: Secondary | ICD-10-CM | POA: Insufficient documentation

## 2021-01-14 DIAGNOSIS — I5022 Chronic systolic (congestive) heart failure: Secondary | ICD-10-CM | POA: Diagnosis not present

## 2021-01-14 DIAGNOSIS — I4892 Unspecified atrial flutter: Secondary | ICD-10-CM | POA: Insufficient documentation

## 2021-01-14 DIAGNOSIS — N184 Chronic kidney disease, stage 4 (severe): Secondary | ICD-10-CM | POA: Insufficient documentation

## 2021-01-14 DIAGNOSIS — I130 Hypertensive heart and chronic kidney disease with heart failure and stage 1 through stage 4 chronic kidney disease, or unspecified chronic kidney disease: Secondary | ICD-10-CM | POA: Insufficient documentation

## 2021-01-14 DIAGNOSIS — I509 Heart failure, unspecified: Secondary | ICD-10-CM | POA: Diagnosis not present

## 2021-01-14 DIAGNOSIS — Z79899 Other long term (current) drug therapy: Secondary | ICD-10-CM | POA: Insufficient documentation

## 2021-01-14 DIAGNOSIS — Z7901 Long term (current) use of anticoagulants: Secondary | ICD-10-CM | POA: Diagnosis not present

## 2021-01-14 DIAGNOSIS — N189 Chronic kidney disease, unspecified: Secondary | ICD-10-CM | POA: Diagnosis not present

## 2021-01-14 DIAGNOSIS — D631 Anemia in chronic kidney disease: Secondary | ICD-10-CM | POA: Diagnosis not present

## 2021-01-14 DIAGNOSIS — Z7951 Long term (current) use of inhaled steroids: Secondary | ICD-10-CM | POA: Insufficient documentation

## 2021-01-14 DIAGNOSIS — I4891 Unspecified atrial fibrillation: Secondary | ICD-10-CM | POA: Diagnosis not present

## 2021-01-14 HISTORY — PX: CARDIOVERSION: SHX1299

## 2021-01-14 SURGERY — CARDIOVERSION
Anesthesia: General

## 2021-01-14 MED ORDER — FUROSEMIDE 10 MG/ML IJ SOLN
INTRAMUSCULAR | Status: AC
  Filled 2021-01-14: qty 8

## 2021-01-14 MED ORDER — ALBUTEROL SULFATE (2.5 MG/3ML) 0.083% IN NEBU: 2.5000 mg | INHALATION_SOLUTION | Freq: Once | RESPIRATORY_TRACT | Status: DC

## 2021-01-14 MED ORDER — SODIUM CHLORIDE 0.9 % IV SOLN: INTRAVENOUS | Status: DC

## 2021-01-14 MED ORDER — PROPOFOL 10 MG/ML IV BOLUS
INTRAVENOUS | Status: DC | PRN
  Administered 2021-01-14: 50 mg via INTRAVENOUS

## 2021-01-14 MED ORDER — IPRATROPIUM-ALBUTEROL 0.5-2.5 (3) MG/3ML IN SOLN
RESPIRATORY_TRACT | Status: AC
  Filled 2021-01-14: qty 3

## 2021-01-14 MED ORDER — FUROSEMIDE 10 MG/ML IJ SOLN
80.0000 mg | Freq: Once | INTRAMUSCULAR | Status: AC
  Administered 2021-01-14: 80 mg via INTRAVENOUS
  Filled 2021-01-14: qty 8

## 2021-01-14 MED ORDER — IPRATROPIUM BROMIDE 0.02 % IN SOLN
0.5000 mg | Freq: Once | RESPIRATORY_TRACT | Status: DC
  Filled 2021-01-14: qty 2.5

## 2021-01-14 MED ORDER — LIDOCAINE 2% (20 MG/ML) 5 ML SYRINGE
INTRAMUSCULAR | Status: DC | PRN
  Administered 2021-01-14: 60 mg via INTRAVENOUS

## 2021-01-14 MED ORDER — IPRATROPIUM BROMIDE 0.02 % IN SOLN
0.5000 mg | Freq: Once | RESPIRATORY_TRACT | Status: AC
  Administered 2021-01-14: 0.5 mg via RESPIRATORY_TRACT

## 2021-01-14 NOTE — Transfer of Care (Signed)
Immediate Anesthesia Transfer of Care Note  Patient: Nicholas [name]  Procedure(s) Performed: CARDIOVERSION  Patient Location: PACU  Anesthesia Type:General  Level of Consciousness: awake, alert , oriented and patient cooperative  Airway & Oxygen Therapy: Patient Spontanous Breathing  Post-op Assessment: Report given to RN, Post -op Vital signs reviewed and stable and Patient moving all extremities X 4  Post vital signs: Reviewed and stable  Last Vitals:  Vitals Value Taken Time  BP    Temp    Pulse    Resp    SpO2      Last Pain:  Vitals:   01/14/21 0651  TempSrc: Oral  PainSc: 0-No pain         Complications: No notable events documented.

## 2021-01-14 NOTE — Anesthesia Preprocedure Evaluation (Addendum)
Anesthesia Evaluation  Patient identified by MRN, date of birth, ID band Patient awake    Reviewed: Allergy & Precautions, NPO status , Patient's Chart, lab work & pertinent test results  History of Anesthesia Complications Negative for: history of anesthetic complications  Airway Mallampati: II  TM Distance: >3 FB Neck ROM: Full    Dental  (+) Upper Dentures, Lower Dentures   Pulmonary asthma , former smoker,     + wheezing      Cardiovascular hypertension, +CHF  + dysrhythmias Atrial Fibrillation  Rhythm:Regular Rate:Tachycardia   Echo 10/25/20: EF 40-45%, no RWMA, mod LVH, nl RVSF, mod pulm HTN, mod LAE, mild MR, mild AR, mild AS (MG 17, AVA 1.82), ascending aorta 39 mm   Neuro/Psych TIA   GI/Hepatic negative GI ROS, Neg liver ROS,   Endo/Other  negative endocrine ROS  Renal/GU CRFRenal disease (CKD, Cr 1.87)  negative genitourinary   Musculoskeletal  (+) Arthritis ,   Abdominal   Peds  Hematology  (+) anemia ,   Anesthesia Other Findings  Reports new onset head cold. Has some audible wheezing but reports no shortness of breath.  Reproductive/Obstetrics                            Anesthesia Physical  Anesthesia Plan  ASA: 3  Anesthesia Plan: General   Post-op Pain Management: Minimal or no pain anticipated   Induction: Intravenous  PONV Risk Score and Plan: 2 and TIVA and Treatment may vary due to age or medical condition  Airway Management Planned: Mask  Additional Equipment: None  Intra-op Plan:   Post-operative Plan:   Informed Consent: I have reviewed the patients History and Physical, chart, labs and discussed the procedure including the risks, benefits and alternatives for the proposed anesthesia with the patient or authorized representative who has indicated his/her understanding and acceptance.       Plan Discussed with:   Anesthesia Plan Comments:          Anesthesia Quick Evaluation

## 2021-01-14 NOTE — CV Procedure (Signed)
° °   DIRECT CURRENT CARDIOVERSION  NAME:  Nicholas [name]   MRN: [id number] DOB:  04-Jul-1933   ADMIT DATE: 01/14/2021   INDICATIONS: Atrial flutter   PROCEDURE:   Informed consent was obtained prior to the procedure. The risks, benefits and alternatives for the procedure were discussed and the patient comprehended these risks. On arrival patient with audible wheezing. Given IV lasix and nebulizer with much improvement.  Once an appropriate time out was taken, the patient had the defibrillator pads placed in the anterior and posterior position. The patient then underwent sedation by the anesthesia service. Once an appropriate level of sedation was achieved, the patient received a single biphasic, synchronized 150J shock with prompt conversion to sinus rhythm. No apparent complications.  Glori Bickers, MD  9:11 AM

## 2021-01-14 NOTE — Anesthesia Postprocedure Evaluation (Signed)
Anesthesia Post Note  Patient: Nicholas [name]  Procedure(s) Performed: CARDIOVERSION     Patient location during evaluation: Endoscopy Anesthesia Type: General Level of consciousness: awake and alert Pain management: pain level controlled Vital Signs Assessment: post-procedure vital signs reviewed and stable Respiratory status: spontaneous breathing, nonlabored ventilation and respiratory function stable Cardiovascular status: blood pressure returned to baseline and stable Postop Assessment: no apparent nausea or vomiting Anesthetic complications: no   No notable events documented.  Last Vitals:  Vitals:   01/14/21 0934 01/14/21 0937  BP:  114/73  Pulse: 79 78  Resp: 20 (!) 22  Temp:    SpO2: 95% 97%    Last Pain:  Vitals:   01/14/21 0651  TempSrc: Oral  PainSc: 0-No pain                 Lidia Collum

## 2021-01-15 NOTE — Interval H&P Note (Signed)
History and Physical Interval Note:  01/15/2021 5:26 PM  Nicholas [name]  has presented today for surgery, with the diagnosis of afib.  The various methods of treatment have been discussed with the patient and family. After consideration of risks, benefits and other options for treatment, the patient has consented to  Procedure(s): CARDIOVERSION (N/A) as a surgical intervention.  The patient's history has been reviewed, patient examined, no change in status, stable for surgery.  I have reviewed the patient's chart and labs.  Questions were answered to the patient's satisfaction.     Chrisy Hillebrand

## 2021-01-17 ENCOUNTER — Encounter (HOSPITAL_COMMUNITY): Payer: Self-pay | Admitting: Internal Medicine

## 2021-01-17 DIAGNOSIS — R059 Cough, unspecified: Secondary | ICD-10-CM | POA: Diagnosis not present

## 2021-01-17 DIAGNOSIS — R062 Wheezing: Secondary | ICD-10-CM | POA: Diagnosis not present

## 2021-01-17 DIAGNOSIS — J111 Influenza due to unidentified influenza virus with other respiratory manifestations: Secondary | ICD-10-CM | POA: Diagnosis not present

## 2021-01-17 DIAGNOSIS — J441 Chronic obstructive pulmonary disease with (acute) exacerbation: Secondary | ICD-10-CM | POA: Diagnosis not present

## 2021-01-18 ENCOUNTER — Encounter: Payer: Self-pay | Admitting: Oncology

## 2021-01-18 ENCOUNTER — Inpatient Hospital Stay (HOSPITAL_BASED_OUTPATIENT_CLINIC_OR_DEPARTMENT_OTHER)
Admission: EM | Admit: 2021-01-18 | Discharge: 2021-01-28 | DRG: 291 | Disposition: A | Discharge status: Home Health | Payer: PPO | Attending: Internal Medicine | Admitting: Internal Medicine

## 2021-01-18 ENCOUNTER — Encounter (HOSPITAL_BASED_OUTPATIENT_CLINIC_OR_DEPARTMENT_OTHER): Payer: Self-pay | Admitting: Emergency Medicine

## 2021-01-18 ENCOUNTER — Emergency Department (HOSPITAL_BASED_OUTPATIENT_CLINIC_OR_DEPARTMENT_OTHER): Payer: PPO

## 2021-01-18 ENCOUNTER — Other Ambulatory Visit: Payer: Self-pay

## 2021-01-18 DIAGNOSIS — D631 Anemia in chronic kidney disease: Secondary | ICD-10-CM | POA: Diagnosis present

## 2021-01-18 DIAGNOSIS — D72829 Elevated white blood cell count, unspecified: Secondary | ICD-10-CM | POA: Diagnosis not present

## 2021-01-18 DIAGNOSIS — Z79899 Other long term (current) drug therapy: Secondary | ICD-10-CM | POA: Diagnosis not present

## 2021-01-18 DIAGNOSIS — N189 Chronic kidney disease, unspecified: Secondary | ICD-10-CM | POA: Diagnosis not present

## 2021-01-18 DIAGNOSIS — N1832 Chronic kidney disease, stage 3b: Secondary | ICD-10-CM | POA: Diagnosis not present

## 2021-01-18 DIAGNOSIS — R0600 Dyspnea, unspecified: Secondary | ICD-10-CM | POA: Diagnosis not present

## 2021-01-18 DIAGNOSIS — T380X5A Adverse effect of glucocorticoids and synthetic analogues, initial encounter: Secondary | ICD-10-CM | POA: Diagnosis not present

## 2021-01-18 DIAGNOSIS — K521 Toxic gastroenteritis and colitis: Secondary | ICD-10-CM | POA: Diagnosis not present

## 2021-01-18 DIAGNOSIS — Z8249 Family history of ischemic heart disease and other diseases of the circulatory system: Secondary | ICD-10-CM | POA: Diagnosis not present

## 2021-01-18 DIAGNOSIS — D75838 Other thrombocytosis: Secondary | ICD-10-CM | POA: Diagnosis not present

## 2021-01-18 DIAGNOSIS — Z6825 Body mass index (BMI) 25.0-25.9, adult: Secondary | ICD-10-CM

## 2021-01-18 DIAGNOSIS — Z515 Encounter for palliative care: Secondary | ICD-10-CM | POA: Diagnosis not present

## 2021-01-18 DIAGNOSIS — I480 Paroxysmal atrial fibrillation: Secondary | ICD-10-CM | POA: Diagnosis not present

## 2021-01-18 DIAGNOSIS — N184 Chronic kidney disease, stage 4 (severe): Secondary | ICD-10-CM | POA: Diagnosis present

## 2021-01-18 DIAGNOSIS — E440 Moderate protein-calorie malnutrition: Secondary | ICD-10-CM | POA: Diagnosis present

## 2021-01-18 DIAGNOSIS — M109 Gout, unspecified: Secondary | ICD-10-CM | POA: Diagnosis not present

## 2021-01-18 DIAGNOSIS — I428 Other cardiomyopathies: Secondary | ICD-10-CM | POA: Diagnosis not present

## 2021-01-18 DIAGNOSIS — I10 Essential (primary) hypertension: Secondary | ICD-10-CM | POA: Diagnosis present

## 2021-01-18 DIAGNOSIS — Z66 Do not resuscitate: Secondary | ICD-10-CM | POA: Diagnosis not present

## 2021-01-18 DIAGNOSIS — J9601 Acute respiratory failure with hypoxia: Secondary | ICD-10-CM | POA: Diagnosis not present

## 2021-01-18 DIAGNOSIS — N179 Acute kidney failure, unspecified: Secondary | ICD-10-CM | POA: Diagnosis not present

## 2021-01-18 DIAGNOSIS — J441 Chronic obstructive pulmonary disease with (acute) exacerbation: Secondary | ICD-10-CM | POA: Diagnosis not present

## 2021-01-18 DIAGNOSIS — R0602 Shortness of breath: Secondary | ICD-10-CM | POA: Diagnosis not present

## 2021-01-18 DIAGNOSIS — T364X5A Adverse effect of tetracyclines, initial encounter: Secondary | ICD-10-CM | POA: Diagnosis not present

## 2021-01-18 DIAGNOSIS — I484 Atypical atrial flutter: Secondary | ICD-10-CM

## 2021-01-18 DIAGNOSIS — H9190 Unspecified hearing loss, unspecified ear: Secondary | ICD-10-CM | POA: Diagnosis present

## 2021-01-18 DIAGNOSIS — J9811 Atelectasis: Secondary | ICD-10-CM | POA: Diagnosis not present

## 2021-01-18 DIAGNOSIS — I482 Chronic atrial fibrillation, unspecified: Secondary | ICD-10-CM | POA: Diagnosis not present

## 2021-01-18 DIAGNOSIS — J101 Influenza due to other identified influenza virus with other respiratory manifestations: Secondary | ICD-10-CM | POA: Diagnosis present

## 2021-01-18 DIAGNOSIS — I130 Hypertensive heart and chronic kidney disease with heart failure and stage 1 through stage 4 chronic kidney disease, or unspecified chronic kidney disease: Principal | ICD-10-CM | POA: Diagnosis present

## 2021-01-18 DIAGNOSIS — I110 Hypertensive heart disease with heart failure: Secondary | ICD-10-CM | POA: Diagnosis not present

## 2021-01-18 DIAGNOSIS — Z09 Encounter for follow-up examination after completed treatment for conditions other than malignant neoplasm: Secondary | ICD-10-CM

## 2021-01-18 DIAGNOSIS — Z8673 Personal history of transient ischemic attack (TIA), and cerebral infarction without residual deficits: Secondary | ICD-10-CM

## 2021-01-18 DIAGNOSIS — I5043 Acute on chronic combined systolic (congestive) and diastolic (congestive) heart failure: Secondary | ICD-10-CM | POA: Diagnosis not present

## 2021-01-18 DIAGNOSIS — R0609 Other forms of dyspnea: Secondary | ICD-10-CM | POA: Diagnosis not present

## 2021-01-18 DIAGNOSIS — J90 Pleural effusion, not elsewhere classified: Secondary | ICD-10-CM | POA: Diagnosis not present

## 2021-01-18 DIAGNOSIS — Z880 Allergy status to penicillin: Secondary | ICD-10-CM

## 2021-01-18 DIAGNOSIS — Z7901 Long term (current) use of anticoagulants: Secondary | ICD-10-CM

## 2021-01-18 DIAGNOSIS — Z20822 Contact with and (suspected) exposure to covid-19: Secondary | ICD-10-CM | POA: Diagnosis present

## 2021-01-18 DIAGNOSIS — R54 Age-related physical debility: Secondary | ICD-10-CM | POA: Diagnosis present

## 2021-01-18 DIAGNOSIS — I4891 Unspecified atrial fibrillation: Secondary | ICD-10-CM | POA: Diagnosis not present

## 2021-01-18 DIAGNOSIS — Z7951 Long term (current) use of inhaled steroids: Secondary | ICD-10-CM

## 2021-01-18 DIAGNOSIS — E871 Hypo-osmolality and hyponatremia: Secondary | ICD-10-CM | POA: Diagnosis not present

## 2021-01-18 DIAGNOSIS — Z87891 Personal history of nicotine dependence: Secondary | ICD-10-CM | POA: Diagnosis not present

## 2021-01-18 DIAGNOSIS — I5041 Acute combined systolic (congestive) and diastolic (congestive) heart failure: Secondary | ICD-10-CM | POA: Diagnosis not present

## 2021-01-18 DIAGNOSIS — Z7189 Other specified counseling: Secondary | ICD-10-CM | POA: Diagnosis not present

## 2021-01-18 DIAGNOSIS — I509 Heart failure, unspecified: Secondary | ICD-10-CM | POA: Diagnosis not present

## 2021-01-18 DIAGNOSIS — I517 Cardiomegaly: Secondary | ICD-10-CM | POA: Diagnosis not present

## 2021-01-18 DIAGNOSIS — I5042 Chronic combined systolic (congestive) and diastolic (congestive) heart failure: Secondary | ICD-10-CM | POA: Diagnosis not present

## 2021-01-18 DIAGNOSIS — R0689 Other abnormalities of breathing: Secondary | ICD-10-CM

## 2021-01-18 LAB — COMPREHENSIVE METABOLIC PANEL
ALT: 15 U/L (ref 0–44)
AST: 19 U/L (ref 15–41)
Albumin: 3.5 g/dL (ref 3.5–5.0)
Alkaline Phosphatase: 88 U/L (ref 38–126)
Anion gap: 11 (ref 5–15)
BUN: 37 mg/dL — ABNORMAL HIGH (ref 8–23)
CO2: 22 mmol/L (ref 22–32)
Calcium: 9 mg/dL (ref 8.9–10.3)
Chloride: 104 mmol/L (ref 98–111)
Creatinine, Ser: 2.35 mg/dL — ABNORMAL HIGH (ref 0.61–1.24)
GFR, Estimated: 26 mL/min — ABNORMAL LOW (ref >60)
Glucose, Bld: 184 mg/dL — ABNORMAL HIGH (ref 70–99)
Potassium: 5 mmol/L (ref 3.5–5.1)
Sodium: 137 mmol/L (ref 135–145)
Total Bilirubin: 0.5 mg/dL (ref 0.3–1.2)
Total Protein: 6.4 g/dL — ABNORMAL LOW (ref 6.5–8.1)

## 2021-01-18 LAB — I-STAT VENOUS BLOOD GAS, ED
Acid-base deficit: 3 mmol/L — ABNORMAL HIGH (ref 0.0–2.0)
Bicarbonate: 22.3 mmol/L (ref 20.0–28.0)
Calcium, Ion: 1.22 mmol/L (ref 1.15–1.40)
HCT: 37 % — ABNORMAL LOW (ref 39.0–52.0)
Hemoglobin: 12.6 g/dL — ABNORMAL LOW (ref 13.0–17.0)
O2 Saturation: 75 %
Potassium: 4.9 mmol/L (ref 3.5–5.1)
Sodium: 137 mmol/L (ref 135–145)
TCO2: 24 mmol/L (ref 22–32)
pCO2, Ven: 40.4 mmHg — ABNORMAL LOW (ref 44.0–60.0)
pH, Ven: 7.35 (ref 7.250–7.430)
pO2, Ven: 42 mmHg (ref 32.0–45.0)

## 2021-01-18 LAB — CBC
HCT: 35.5 % — ABNORMAL LOW (ref 39.0–52.0)
Hemoglobin: 10.8 g/dL — ABNORMAL LOW (ref 13.0–17.0)
MCH: 22.1 pg — ABNORMAL LOW (ref 26.0–34.0)
MCHC: 30.4 g/dL (ref 30.0–36.0)
MCV: 72.6 fL — ABNORMAL LOW (ref 80.0–100.0)
Platelets: 471 10*3/uL — ABNORMAL HIGH (ref 150–400)
RBC: 4.89 MIL/uL (ref 4.22–5.81)
RDW: 21.6 % — ABNORMAL HIGH (ref 11.5–15.5)
WBC: 11.5 10*3/uL — ABNORMAL HIGH (ref 4.0–10.5)
nRBC: 0 % (ref 0.0–0.2)

## 2021-01-18 LAB — RESP PANEL BY RT-PCR (FLU A&B, COVID) ARPGX2
Influenza A by PCR: NEGATIVE
Influenza B by PCR: NEGATIVE
SARS Coronavirus 2 by RT PCR: NEGATIVE

## 2021-01-18 LAB — TROPONIN I (HIGH SENSITIVITY): Troponin I (High Sensitivity): 23 ng/L — ABNORMAL HIGH (ref <18)

## 2021-01-18 LAB — BRAIN NATRIURETIC PEPTIDE: B Natriuretic Peptide: 2417.5 pg/mL — ABNORMAL HIGH (ref 0.0–100.0)

## 2021-01-18 MED ORDER — ALBUTEROL SULFATE (2.5 MG/3ML) 0.083% IN NEBU
5.0000 mg | INHALATION_SOLUTION | Freq: Once | RESPIRATORY_TRACT | Status: AC
Start: 2021-01-18 — End: 2021-01-18
  Administered 2021-01-18: 5 mg via RESPIRATORY_TRACT
  Filled 2021-01-18: qty 6

## 2021-01-18 MED ORDER — ALBUTEROL SULFATE (2.5 MG/3ML) 0.083% IN NEBU
5.0000 mg | INHALATION_SOLUTION | Freq: Once | RESPIRATORY_TRACT | Status: AC
  Administered 2021-01-18: 5 mg via RESPIRATORY_TRACT
  Filled 2021-01-18: qty 6

## 2021-01-18 MED ORDER — METHYLPREDNISOLONE SODIUM SUCC 125 MG IJ SOLR
125.0000 mg | Freq: Once | INTRAMUSCULAR | Status: AC
  Administered 2021-01-18: 125 mg via INTRAVENOUS
  Filled 2021-01-18: qty 2

## 2021-01-18 MED ORDER — FUROSEMIDE 10 MG/ML IJ SOLN
60.0000 mg | Freq: Once | INTRAMUSCULAR | Status: AC
  Administered 2021-01-18: 60 mg via INTRAVENOUS
  Filled 2021-01-18: qty 6

## 2021-01-18 MED ORDER — IPRATROPIUM BROMIDE 0.02 % IN SOLN
0.5000 mg | Freq: Once | RESPIRATORY_TRACT | Status: AC
  Administered 2021-01-18: 0.5 mg via RESPIRATORY_TRACT
  Filled 2021-01-18: qty 2.5

## 2021-01-18 NOTE — ED Notes (Signed)
Pt placed on 2L  supplemental oxygen

## 2021-01-18 NOTE — ED Triage Notes (Signed)
Pt arrives to ED with c/o shortness of breath. This started x4 days ago and has progressively worsened. Associated symptoms include wheezing. Pt reports he was dx with Flu B last night.

## 2021-01-18 NOTE — ED Provider Notes (Addendum)
[facility] DEPT Provider Note   CSN: [id number] Arrival date & time: 01/18/21  1606     History  Chief Complaint  Patient presents with   Shortness of Breath    Nicholas [name] is a 88 y.o. male.  Patient with hx copd, chf, presents with increased wheezing and sob in the past week. Symptoms acute onset, moderate persistent, worsening. States admitted w similar symptoms last month for CHF. Has hx copd/wheezing. Was at urgent care last night, got steroids, doxycycline, antiviral rx as tested positive for flu, but worse today. No increased leg swelling. + orthopnea. No chest pain or discomfort. Occasional non prod cough. No sore throat. No specific known ill contacts. Compliant w home meds.   The history is provided by medical records, a relative and the patient. The history is limited by the condition of the patient.  Shortness of Breath Associated symptoms: cough and wheezing   Associated symptoms: no abdominal pain, no chest pain, no fever, no headaches, no neck pain, no rash, no sore throat and no vomiting       Home Medications Prior to Admission medications   Medication Sig Start Date End Date Taking? Authorizing Provider  acetaminophen (TYLENOL) 500 MG tablet Take 500-1,000 mg by mouth every 8 (eight) hours as needed (for pain or headaches).     [provider]  albuterol (PROVENTIL HFA;VENTOLIN HFA) 108 (90 Base) MCG/ACT inhaler Inhale 2 puffs into the lungs every 4 (four) hours as needed for wheezing or shortness of breath.    [provider]  allopurinol (ZYLOPRIM) 100 MG tablet Take 100 mg by mouth daily.    [provider]  amiodarone (PACERONE) 200 MG tablet Take 2 tablets (400 mg total) by mouth 2 (two) times daily. 01/11/21   Clegg, Amy D, NP  amLODipine (NORVASC) 5 MG tablet Take 5 mg by mouth daily. 10/11/20   [provider]  apixaban (ELIQUIS) 2.5 MG TABS tablet Take 1 tablet (2.5 mg total) by mouth 2 (two)  times daily. 07/21/18   Marcelyn Bruins, MD  Calcium Carb-Cholecalciferol (CALCIUM 600+D3 PO) Take 2 tablets by mouth daily.    [provider]  Ensure (ENSURE) Take 237 mLs by mouth daily.    [provider]  Fluticasone-Salmeterol (ADVAIR) 250-50 MCG/DOSE AEPB Inhale 1 puff into the lungs 2 (two) times daily.    [provider]  furosemide (LASIX) 20 MG tablet Take 2 tablets (40 mg total) by mouth daily for 3 days, THEN 1 tablet (20 mg total) as needed. 01/11/21 01/14/22  Clegg, Amy D, NP  latanoprost (XALATAN) 0.005 % ophthalmic solution Place 1 drop into both eyes at bedtime. 07/26/17   [provider]  metoprolol succinate (TOPROL-XL) 25 MG 24 hr tablet Take 0.5 tablets (12.5 mg total) by mouth daily. 12/31/20   Caren Griffins, MD  Multiple Vitamin (MULTIVITAMIN WITH MINERALS) TABS tablet Take 1 tablet by mouth every morning.    [provider]  Omega-3 Fatty Acids (FISH OIL) 1000 MG CAPS Take 1,000 mg by mouth in the morning.    [provider]  pantoprazole (PROTONIX) 20 MG tablet Take 1 tablet (20 mg total) by mouth daily. 04/01/18   Garvin Fila, MD      Allergies    Penicillins    Review of Systems   Review of Systems  Constitutional:  Negative for fever.  HENT:  Negative for sore throat.   Eyes:  Negative for redness.  Respiratory:  Positive for cough, shortness of breath and wheezing.   Cardiovascular:  Negative for chest pain and leg swelling.  Gastrointestinal:  Negative for abdominal pain and vomiting.  Genitourinary:  Negative for flank pain.  Musculoskeletal:  Negative for back pain and neck pain.  Skin:  Negative for rash.  Neurological:  Negative for headaches.  Hematological:  Does not bruise/bleed easily.  Psychiatric/Behavioral:  Negative for agitation.    Physical Exam Updated Vital Signs BP 125/72 (BP Location: Left Arm)    Pulse 98    Temp 98.6 F (37 C) (Temporal)    Resp (!) 28    SpO2 95%  Physical  Exam Vitals and nursing note reviewed.  Constitutional:      Appearance: Normal appearance. He is well-developed.  HENT:     Head: Atraumatic.     Nose: Nose normal.     Mouth/Throat:     Pharynx: Oropharynx is clear.     Comments: Dry mucous membranes/mouth breather.  Eyes:     General: No scleral icterus.    Conjunctiva/sclera: Conjunctivae normal.     Pupils: Pupils are equal, round, and reactive to light.  Neck:     Trachea: No tracheal deviation.     Comments: No stiffness or rigidity.  Cardiovascular:     Rate and Rhythm: Normal rate and regular rhythm.     Pulses: Normal pulses.     Heart sounds: Normal heart sounds. No murmur heard.   No friction rub. No gallop.  Pulmonary:     Effort: Respiratory distress present. No accessory muscle usage.     Breath sounds: Wheezing present.  Abdominal:     General: Bowel sounds are normal. There is no distension.     Palpations: Abdomen is soft.     Tenderness: There is no abdominal tenderness. There is no guarding.  Genitourinary:    Comments: No cva tenderness. Musculoskeletal:     Cervical back: Normal range of motion and neck supple. No rigidity.     Comments: Trace, symmetric, bilateral leg edema.   Skin:    General: Skin is warm and dry.     Findings: No rash.  Neurological:     Mental Status: He is alert.     Comments: Alert, speech clear.   Psychiatric:        Mood and Affect: Mood normal.    ED Results / Procedures / Treatments   Labs (all labs ordered are listed, but only abnormal results are displayed) Results for orders placed or performed during the hospital encounter of 01/18/21  Resp Panel by RT-PCR (Flu A&B, Covid) Nasopharyngeal Swab   Specimen: Nasopharyngeal Swab; Nasopharyngeal(NP) swabs in vial transport medium  Result Value Ref Range   SARS Coronavirus 2 by RT PCR NEGATIVE NEGATIVE   Influenza A by PCR NEGATIVE NEGATIVE   Influenza B by PCR NEGATIVE NEGATIVE  CBC  Result Value Ref Range   WBC  11.5 (H) 4.0 - 10.5 K/uL   RBC 4.89 4.22 - 5.81 MIL/uL   Hemoglobin 10.8 (L) 13.0 - 17.0 g/dL   HCT 35.5 (L) 39.0 - 52.0 %   MCV 72.6 (L) 80.0 - 100.0 fL   MCH 22.1 (L) 26.0 - 34.0 pg   MCHC 30.4 30.0 - 36.0 g/dL   RDW 21.6 (H) 11.5 - 15.5 %   Platelets 471 (H) 150 - 400 K/uL   nRBC 0.0 0.0 - 0.2 %  Comprehensive metabolic panel  Result Value Ref Range   Sodium 137 135 -  145 mmol/L   Potassium 5.0 3.5 - 5.1 mmol/L   Chloride 104 98 - 111 mmol/L   CO2 22 22 - 32 mmol/L   Glucose, Bld 184 (H) 70 - 99 mg/dL   BUN 37 (H) 8 - 23 mg/dL   Creatinine, Ser 2.35 (H) 0.61 - 1.24 mg/dL   Calcium 9.0 8.9 - 10.3 mg/dL   Total Protein 6.4 (L) 6.5 - 8.1 g/dL   Albumin 3.5 3.5 - 5.0 g/dL   AST 19 15 - 41 U/L   ALT 15 0 - 44 U/L   Alkaline Phosphatase 88 38 - 126 U/L   Total Bilirubin 0.5 0.3 - 1.2 mg/dL   GFR, Estimated 26 (L) >60 mL/min   Anion gap 11 5 - 15  Brain natriuretic peptide  Result Value Ref Range   B Natriuretic Peptide 2,417.5 (H) 0.0 - 100.0 pg/mL   DG CHEST PORT 1 VIEW  Result Date: 12/24/2020 CLINICAL DATA:  Acute on chronic combined systolic and diastolic CHF. EXAM: PORTABLE CHEST 1 VIEW COMPARISON:  09/20/2020 FINDINGS: Single-view of the chest demonstrates densities at the right lung base which could be associated with volume loss or even pleural fluid. Heart is enlarged. Left lung is clear without overt pulmonary edema. Right shoulder replacement. IMPRESSION: 1. Right basilar chest densities. Findings could represent volume loss and/or pleural fluid. 2. Cardiomegaly without pulmonary edema. Electronically Signed   By: Markus Daft M.D.   On: 12/24/2020 11:24   DG Foot 2 Views Left  Result Date: 12/27/2020 CLINICAL DATA:  Left foot pain. EXAM: LEFT FOOT - 2 VIEW COMPARISON:  None. FINDINGS: There is no evidence for acute fracture or dislocation. Joint spaces are well maintained. There is mild hallux valgus. Peripheral vascular calcifications are present. There are minimal  soft tissue calcifications adjacent to the medial aspect of the first metatarsal head. No underlying erosive change. IMPRESSION: 1. No acute bony abnormality. 2. Mild hallux valgus. 3. Mild soft tissue calcifications adjacent to the first metatarsal head without underlying osseous abnormality. These are nonspecific. Gout not excluded. Electronically Signed   By: Ronney Asters M.D.   On: 12/27/2020 16:08    EKG EKG Interpretation  Date/Time:  Tuesday January 18 2021 16:29:58 EST Ventricular Rate:  99 PR Interval:  132 QRS Duration: 177 QT Interval:  443 QTC Calculation: 569 R Axis:   217 Text Interpretation: Sinus or ectopic atrial rhythm Right bundle branch block Confirmed by Lajean Saver [phone]) on 01/18/2021 6:35:37 PM  Radiology DG Chest Port 1 View  Result Date: 01/18/2021 CLINICAL DATA:  Shortness of breath EXAM: PORTABLE CHEST 1 VIEW COMPARISON:  Chest x-ray dated December 24, 2020 FINDINGS: Possible increased cardiomegaly. Small right pleural effusion and mild bibasilar opacities. No evidence of pneumothorax. IMPRESSION: 1. Possible increased cardiomegaly, concerning for pericardial effusion. Recommend echocardiography for further evaluation. 2. Small right pleural effusion and mild bibasilar opacities which likely represent atelectasis. Electronically Signed   By: Yetta Glassman M.D.   On: 01/18/2021 17:16    Procedures Procedures    Medications Ordered in ED Medications  albuterol (PROVENTIL) (2.5 MG/3ML) 0.083% nebulizer solution 5 mg (has no administration in time range)  ipratropium (ATROVENT) nebulizer solution 0.5 mg (has no administration in time range)  methylPREDNISolone sodium succinate (SOLU-MEDROL) 125 mg/2 mL injection 125 mg (has no administration in time range)    ED Course/ Medical Decision Making/ A&P  Medical Decision Making Labs and imaging ordered. Ecg. Pcxr. Stat labs.   Albuterol and atrovent tx. Solumedrol iv.   Diff dx  considered including copd exacerbation, chf exacerbation, pna, and other serious medical conditions.   Additional hx from family member.   Reviewed nursing notes and prior charts for additional history.  External reports reviewed and considered in decision making.   Additional neb treatment as persistent wheezing, continuous treatment.   Labs reviewed/interpreted by me - ckd. Bnp very high. Lasix iv.   CXR reviewed/interpreted by me - vascular congestion. Enlarged cardiac profile.   Recheck, wheezing improved but persists.   Medicine consulted for admission.  CRITICAL CARE Re: acute chf exacerbation with acute resp distress, copd exacerbation/wheezing, multifactorial acute resp failure.  Performed by: Mirna Mires Total critical care time: 45 minutes Critical care time was exclusive of separately billable procedures and treating other patients. Critical care was necessary to treat or prevent imminent or life-threatening deterioration. Critical care was time spent personally by me on the following activities: development of treatment plan with patient and/or surrogate as well as nursing, discussions with consultants, evaluation of patient's response to treatment, examination of patient, obtaining history from patient or surrogate, ordering and performing treatments and interventions, ordering and review of laboratory studies, ordering and review of radiographic studies, pulse oximetry and re-evaluation of patient's condition.   Discussed w Dr Roel Cluck - accepts in admission. Will add vbg and trop.         Final Clinical Impression(s) / ED Diagnoses Final diagnoses:  None    Rx / DC Orders ED Discharge Orders     None           Lajean Saver, MD 01/18/21 1924

## 2021-01-19 ENCOUNTER — Encounter: Payer: Self-pay | Admitting: Oncology

## 2021-01-19 DIAGNOSIS — I5042 Chronic combined systolic (congestive) and diastolic (congestive) heart failure: Secondary | ICD-10-CM | POA: Diagnosis not present

## 2021-01-19 DIAGNOSIS — I482 Chronic atrial fibrillation, unspecified: Secondary | ICD-10-CM | POA: Diagnosis not present

## 2021-01-19 DIAGNOSIS — J441 Chronic obstructive pulmonary disease with (acute) exacerbation: Secondary | ICD-10-CM

## 2021-01-19 DIAGNOSIS — J101 Influenza due to other identified influenza virus with other respiratory manifestations: Secondary | ICD-10-CM | POA: Diagnosis present

## 2021-01-19 DIAGNOSIS — I5043 Acute on chronic combined systolic (congestive) and diastolic (congestive) heart failure: Secondary | ICD-10-CM

## 2021-01-19 DIAGNOSIS — I10 Essential (primary) hypertension: Secondary | ICD-10-CM

## 2021-01-19 DIAGNOSIS — N1832 Chronic kidney disease, stage 3b: Secondary | ICD-10-CM

## 2021-01-19 DIAGNOSIS — N184 Chronic kidney disease, stage 4 (severe): Secondary | ICD-10-CM

## 2021-01-19 LAB — BLOOD GAS, VENOUS
Acid-base deficit: 1.3 mmol/L (ref 0.0–2.0)
Bicarbonate: 22.8 mmol/L (ref 20.0–28.0)
FIO2: 28
O2 Saturation: 87.1 %
Patient temperature: 37
pCO2, Ven: 37.6 mmHg — ABNORMAL LOW (ref 44.0–60.0)
pH, Ven: 7.4 (ref 7.250–7.430)
pO2, Ven: 55.9 mmHg — ABNORMAL HIGH (ref 32.0–45.0)

## 2021-01-19 LAB — MRSA NEXT GEN BY PCR, NASAL: MRSA by PCR Next Gen: NOT DETECTED

## 2021-01-19 LAB — BASIC METABOLIC PANEL
Anion gap: 10 (ref 5–15)
BUN: 34 mg/dL — ABNORMAL HIGH (ref 8–23)
CO2: 23 mmol/L (ref 22–32)
Calcium: 8.7 mg/dL — ABNORMAL LOW (ref 8.9–10.3)
Chloride: 103 mmol/L (ref 98–111)
Creatinine, Ser: 2.36 mg/dL — ABNORMAL HIGH (ref 0.61–1.24)
GFR, Estimated: 26 mL/min — ABNORMAL LOW (ref >60)
Glucose, Bld: 128 mg/dL — ABNORMAL HIGH (ref 70–99)
Potassium: 4.5 mmol/L (ref 3.5–5.1)
Sodium: 136 mmol/L (ref 135–145)

## 2021-01-19 LAB — HIV ANTIBODY (ROUTINE TESTING W REFLEX): HIV Screen 4th Generation wRfx: NONREACTIVE

## 2021-01-19 MED ORDER — PREDNISONE 20 MG PO TABS
40.0000 mg | ORAL_TABLET | Freq: Every day | ORAL | Status: DC
  Administered 2021-01-19 – 2021-01-20 (×2): 40 mg via ORAL
  Filled 2021-01-19 (×2): qty 2

## 2021-01-19 MED ORDER — LATANOPROST 0.005 % OP SOLN
1.0000 [drp] | Freq: Every day | OPHTHALMIC | Status: DC
  Administered 2021-01-19 – 2021-01-27 (×9): 1 [drp] via OPHTHALMIC
  Filled 2021-01-19: qty 2.5

## 2021-01-19 MED ORDER — ADULT MULTIVITAMIN W/MINERALS CH
1.0000 | ORAL_TABLET | Freq: Every morning | ORAL | Status: DC
  Administered 2021-01-19 – 2021-01-28 (×9): 1 via ORAL
  Filled 2021-01-19 (×6): qty 1

## 2021-01-19 MED ORDER — ALLOPURINOL 100 MG PO TABS
100.0000 mg | ORAL_TABLET | Freq: Every day | ORAL | Status: DC
  Administered 2021-01-19 – 2021-01-28 (×10): 100 mg via ORAL
  Filled 2021-01-19 (×10): qty 1

## 2021-01-19 MED ORDER — MOMETASONE FURO-FORMOTEROL FUM 200-5 MCG/ACT IN AERO
2.0000 | INHALATION_SPRAY | Freq: Two times a day (BID) | RESPIRATORY_TRACT | Status: DC
  Administered 2021-01-19 – 2021-01-28 (×19): 2 via RESPIRATORY_TRACT
  Filled 2021-01-19: qty 8.8

## 2021-01-19 MED ORDER — METOPROLOL SUCCINATE ER 25 MG PO TB24
12.5000 mg | ORAL_TABLET | Freq: Every day | ORAL | Status: DC
  Administered 2021-01-19 – 2021-01-22 (×4): 12.5 mg via ORAL
  Filled 2021-01-19 (×4): qty 1

## 2021-01-19 MED ORDER — LEVALBUTEROL HCL 1.25 MG/0.5ML IN NEBU
1.2500 mg | INHALATION_SOLUTION | Freq: Four times a day (QID) | RESPIRATORY_TRACT | Status: DC | PRN
  Administered 2021-01-19 – 2021-01-22 (×4): 1.25 mg via RESPIRATORY_TRACT
  Filled 2021-01-19 (×4): qty 0.5

## 2021-01-19 MED ORDER — ENSURE ENLIVE PO LIQD
237.0000 mL | Freq: Two times a day (BID) | ORAL | Status: DC
  Administered 2021-01-19 – 2021-01-25 (×12): 237 mL via ORAL

## 2021-01-19 MED ORDER — UMECLIDINIUM BROMIDE 62.5 MCG/ACT IN AEPB
1.0000 | INHALATION_SPRAY | Freq: Every day | RESPIRATORY_TRACT | Status: DC
  Administered 2021-01-19 – 2021-01-24 (×6): 1 via RESPIRATORY_TRACT
  Filled 2021-01-19: qty 7

## 2021-01-19 MED ORDER — DOXYCYCLINE HYCLATE 100 MG PO TABS
100.0000 mg | ORAL_TABLET | Freq: Two times a day (BID) | ORAL | Status: DC
  Administered 2021-01-19 – 2021-01-20 (×3): 100 mg via ORAL
  Filled 2021-01-19 (×3): qty 1

## 2021-01-19 MED ORDER — OSELTAMIVIR PHOSPHATE 30 MG PO CAPS
30.0000 mg | ORAL_CAPSULE | Freq: Every day | ORAL | Status: AC
  Administered 2021-01-19 – 2021-01-21 (×3): 30 mg via ORAL
  Filled 2021-01-19 (×3): qty 1

## 2021-01-19 MED ORDER — ACETAMINOPHEN 500 MG PO TABS: 500.0000 mg | ORAL_TABLET | Freq: Three times a day (TID) | ORAL | Status: DC | PRN

## 2021-01-19 MED ORDER — FUROSEMIDE 10 MG/ML IJ SOLN
60.0000 mg | Freq: Every day | INTRAMUSCULAR | Status: DC
  Administered 2021-01-19 – 2021-01-21 (×3): 60 mg via INTRAVENOUS
  Filled 2021-01-19 (×3): qty 6

## 2021-01-19 MED ORDER — AMIODARONE HCL 200 MG PO TABS
400.0000 mg | ORAL_TABLET | Freq: Two times a day (BID) | ORAL | Status: DC
  Administered 2021-01-19 – 2021-01-28 (×19): 400 mg via ORAL
  Filled 2021-01-19 (×19): qty 2

## 2021-01-19 MED ORDER — AMLODIPINE BESYLATE 5 MG PO TABS
5.0000 mg | ORAL_TABLET | Freq: Every day | ORAL | Status: DC
  Administered 2021-01-21 – 2021-01-24 (×4): 5 mg via ORAL
  Filled 2021-01-19 (×6): qty 1

## 2021-01-19 MED ORDER — PANTOPRAZOLE SODIUM 20 MG PO TBEC
20.0000 mg | DELAYED_RELEASE_TABLET | Freq: Every day | ORAL | Status: DC
  Administered 2021-01-19 – 2021-01-28 (×10): 20 mg via ORAL
  Filled 2021-01-19 (×10): qty 1

## 2021-01-19 MED ORDER — APIXABAN 2.5 MG PO TABS
2.5000 mg | ORAL_TABLET | Freq: Two times a day (BID) | ORAL | Status: DC
  Administered 2021-01-19 – 2021-01-28 (×19): 2.5 mg via ORAL
  Filled 2021-01-19 (×20): qty 1

## 2021-01-19 NOTE — H&P (Signed)
History and Physical    Nicholas [name] YPP:[id number] DOB: 09/17/1933 DOA: 01/18/2021  PCP: [name], [name], MD  Patient coming from: Home  I have personally briefly reviewed patient's old medical records in [place]  Chief Complaint: SOB, wheezing  HPI: Nicholas [name] is a 88 y.o. male with medical history significant of chronic combined CHF, PAF, CKD 3b.  Pt just underwent cardioversion for A.Fib at hospital x5 days ago.  Since discharge pt developed wheezing, SOB, over the past week.  Symptoms persistent and worsening.  Went to [facility] yesterday 1/9, tested positive for influenza B per report (cant actually see that visit).  Got steroids, doxycycline, and antiviral Rx.  Presents to ED at [facility] today with ongoing wheezing and SOB.  No increased leg swelling.  Does have orthopnea.  No CP nor chest discomfort.  Cough non-productive.  No sore throat.   ED Course: Pt still in NSR, trop 23.  BNP 2417.  CXR: cardiac enlargement, ? Pericardial effusion.  No pulm edema. Does have small R pleural effusion.  Testing negative for COVID and flu (despite yesterdays positive test).  Got lasix 60mg , got steroids, got neb treatment, sent to [facility].     Past Medical History:  Diagnosis Date   Anemia    none recent   Arthritis    Asthma    Chronic kidney disease    ckd stage 3   Family history of adverse reaction to anesthesia    daughter has ponv    Glaucoma    Hypertension     Past Surgical History:  Procedure Laterality Date   CARDIOVERSION N/A 12/30/2020   Procedure: CARDIOVERSION;  Surgeon: Jerline Pain, MD;  Location: [facility] ENDOSCOPY;  Service: Cardiovascular;  Laterality: N/A;   CARDIOVERSION N/A 01/14/2021   Procedure: CARDIOVERSION;  Surgeon: Jolaine Artist, MD;  Location: [facility] ENDOSCOPY;  Service: Cardiovascular;  Laterality: N/A;   COLONOSCOPY WITH PROPOFOL N/A 02/03/2015   Procedure: COLONOSCOPY WITH PROPOFOL;  Surgeon: Arta Silence, MD;  Location: [facility]  ENDOSCOPY;  Service: Endoscopy;  Laterality: N/A;   KNEE ARTHROSCOPY Left    ROTATOR CUFF REPAIR Right    SHOULDER ARTHROSCOPY Left      reports that he has quit smoking. His smoking use included cigarettes. He has a 30.00 pack-year smoking history. He has never used smokeless tobacco. He reports that he does not drink alcohol and does not use drugs.  Allergies  Allergen Reactions   Penicillins Hives    Childhood allergy Has patient had a PCN reaction causing immediate rash, facial/tongue/throat swelling, SOB or lightheadedness with hypotension: Yes Has patient had a PCN reaction causing severe rash involving mucus membranes or skin necrosis: Yes Has patient had a PCN reaction that required hospitalization: Yes Has patient had a PCN reaction occurring within the last 10 years: No If all of the above answers are "NO", then may proceed with Cephalosporin use.     Family History  Problem Relation Age of Onset   Hypertension Mother    Hypertension Father     Prior to Admission medications   Medication Sig Start Date End Date Taking? Authorizing Provider  acetaminophen (TYLENOL) 500 MG tablet Take 500-1,000 mg by mouth every 8 (eight) hours as needed (for pain or headaches).     [provider]  albuterol (PROVENTIL HFA;VENTOLIN HFA) 108 (90 Base) MCG/ACT inhaler Inhale 2 puffs into the lungs every 4 (four) hours as needed for wheezing or shortness of breath.    [provider]  allopurinol (ZYLOPRIM) 100 MG tablet Take 100 mg by mouth daily.    [provider]  amiodarone (PACERONE) 200 MG tablet Take 2 tablets (400 mg total) by mouth 2 (two) times daily. 01/11/21   Clegg, Amy D, NP  amLODipine (NORVASC) 5 MG tablet Take 5 mg by mouth daily. 10/11/20   [provider]  apixaban (ELIQUIS) 2.5 MG TABS tablet Take 1 tablet (2.5 mg total) by mouth 2 (two) times daily. 07/21/18   Marcelyn Bruins, MD  Calcium Carb-Cholecalciferol (CALCIUM 600+D3 PO) Take 2  tablets by mouth daily.    [provider]  Ensure (ENSURE) Take 237 mLs by mouth daily.    [provider]  Fluticasone-Salmeterol (ADVAIR) 250-50 MCG/DOSE AEPB Inhale 1 puff into the lungs 2 (two) times daily.    [provider]  furosemide (LASIX) 20 MG tablet Take 2 tablets (40 mg total) by mouth daily for 3 days, THEN 1 tablet (20 mg total) as needed. 01/11/21 01/14/22  Clegg, Amy D, NP  latanoprost (XALATAN) 0.005 % ophthalmic solution Place 1 drop into both eyes at bedtime. 07/26/17   [provider]  metoprolol succinate (TOPROL-XL) 25 MG 24 hr tablet Take 0.5 tablets (12.5 mg total) by mouth daily. 12/31/20   Caren Griffins, MD  Multiple Vitamin (MULTIVITAMIN WITH MINERALS) TABS tablet Take 1 tablet by mouth every morning.    [provider]  Omega-3 Fatty Acids (FISH OIL) 1000 MG CAPS Take 1,000 mg by mouth in the morning.    [provider]  pantoprazole (PROTONIX) 20 MG tablet Take 1 tablet (20 mg total) by mouth daily. 04/01/18   Garvin Fila, MD    Physical Exam: Vitals:   01/18/21 2115 01/18/21 2230 01/18/21 2246 01/18/21 2330  BP:  119/74  121/71  Pulse: 100 98  (!) 101  Resp: (!) 21 (!) 22  20  Temp:   98.1 F (36.7 C) 98.6 F (37 C)  TempSrc:   Oral Oral  SpO2: 95% 94%  94%  Weight:    85 kg  Height:    5\' 9"  (1.753 m)    Constitutional: Elderly, ill appearing Eyes: PERRL, lids and conjunctivae normal ENMT: Mucous membranes are moist. Posterior pharynx clear of any exudate or lesions.Normal dentition.  Neck: normal, supple, no masses, no thyromegaly Respiratory: Loud expiratory wheezing audible from across the room Cardiovascular: Regular rate and rhythm, no murmurs / rubs / gallops. Trace extremity edema. 2+ pedal pulses. No carotid bruits.  Abdomen: no tenderness, no masses palpated. No hepatosplenomegaly. Bowel sounds positive.  Musculoskeletal: no clubbing / cyanosis. No joint deformity upper and lower  extremities. Good ROM, no contractures. Normal muscle tone.  Skin: no rashes, lesions, ulcers. No induration Neurologic: CN 2-12 grossly intact. Sensation intact, DTR normal. Strength 5/5 in all 4.  Psychiatric: Normal judgment and insight. Alert and oriented x 3. Normal mood.    Labs on Admission: I have personally reviewed following labs and imaging studies  CBC: Recent Labs  Lab 01/18/21 1655 01/18/21 1938  WBC 11.5*  --   HGB 10.8* 12.6*  HCT 35.5* 37.0*  MCV 72.6*  --   PLT 471*  --    Basic Metabolic Panel: Recent Labs  Lab 01/18/21 1655 01/18/21 1938  NA 137 137  K 5.0 4.9  CL 104  --   CO2 22  --   GLUCOSE 184*  --   BUN 37*  --   CREATININE 2.35*  --  CALCIUM 9.0  --    GFR: Estimated Creatinine Clearance: 24.8 mL/min (A) (by C-G formula based on SCr of 2.35 mg/dL (H)). Liver Function Tests: Recent Labs  Lab 01/18/21 1655  AST 19  ALT 15  ALKPHOS 88  BILITOT 0.5  PROT 6.4*  ALBUMIN 3.5   No results for input(s): LIPASE, AMYLASE in the last 168 hours. No results for input(s): AMMONIA in the last 168 hours. Coagulation Profile: No results for input(s): INR, PROTIME in the last 168 hours. Cardiac Enzymes: No results for input(s): CKTOTAL, CKMB, CKMBINDEX, TROPONINI in the last 168 hours. BNP (last 3 results) Recent Labs    10/15/20 0841 10/27/20 0936 12/15/20 1127  PROBNP 5,768* 6,081* 18,617*   HbA1C: No results for input(s): HGBA1C in the last 72 hours. CBG: No results for input(s): GLUCAP in the last 168 hours. Lipid Profile: No results for input(s): CHOL, HDL, LDLCALC, TRIG, CHOLHDL, LDLDIRECT in the last 72 hours. Thyroid Function Tests: No results for input(s): TSH, T4TOTAL, FREET4, T3FREE, THYROIDAB in the last 72 hours. Anemia Panel: No results for input(s): VITAMINB12, FOLATE, FERRITIN, TIBC, IRON, RETICCTPCT in the last 72 hours. Urine analysis:    Component Value Date/Time   COLORURINE YELLOW 07/18/2018 1144   APPEARANCEUR  CLEAR 07/18/2018 1144   LABSPEC 1.019 07/18/2018 1144   PHURINE 5.0 07/18/2018 1144   GLUCOSEU NEGATIVE 07/18/2018 1144   HGBUR NEGATIVE 07/18/2018 [place] 07/18/2018 1144   [place] 07/18/2018 1144   PROTEINUR 100 (A) 07/18/2018 1144   NITRITE NEGATIVE 07/18/2018 1144   LEUKOCYTESUR NEGATIVE 07/18/2018 1144    Radiological Exams on Admission: DG Chest Port 1 View  Result Date: 01/18/2021 CLINICAL DATA:  Shortness of breath EXAM: PORTABLE CHEST 1 VIEW COMPARISON:  Chest x-ray dated December 24, 2020 FINDINGS: Possible increased cardiomegaly. Small right pleural effusion and mild bibasilar opacities. No evidence of pneumothorax. IMPRESSION: 1. Possible increased cardiomegaly, concerning for pericardial effusion. Recommend echocardiography for further evaluation. 2. Small right pleural effusion and mild bibasilar opacities which likely represent atelectasis. Electronically Signed   By: Yetta Glassman M.D.   On: 01/18/2021 17:16    EKG: Independently reviewed.  Assessment/Plan Principal Problem:   COPD exacerbation (HCC) Active Problems:   HTN (hypertension)   Stage 3b chronic kidney disease (CKD) (HCC)   Chronic combined systolic and diastolic CHF (congestive heart failure) (HCC)   A-fib (HCC)    COPD exacerbation - COPD pathway PRN xopenex Scheduled LABA/LAMA/INH steroid Prednisone Cont pulse ox Continue doxycycline Chronic combined CHF - Possibly with mild decompensation given pleural effusion and BNP elevation, but not clear that this is driving his COPD exacerbation today Got lasix 60mg  IV x1 in ED Strict intake and output Reassess volume status to decide if he needs more lasix or not tomorrow AM BMP pending in AM Influenza B - Tested negative today in ED but tested positive [facility] Certainly Influenza virus would be likely to cause severe bronchospasm and COPD exacerbation And certainly he has possible recent exposure as he was  just in hospital for cardioversion x5 days ago. Tamiflu for next 3 days (presumably got at [facility] and yesterday? CKD 3b - Chronic and baseline Daily BMP A.Fib - Currently NSR following cardioversion last week Cont amiodarone Cont BB Cont eliquis  DVT prophylaxis: Eliquis Code Status: Full Family Communication: No family in room Disposition Plan: Home after breathing improved Consults called: None Admission status: Admit to inpatient  Severity of Illness: The appropriate patient status for this  patient is INPATIENT. Inpatient status is judged to be reasonable and necessary in order to provide the required intensity of service to ensure the patient's safety. The patient's presenting symptoms, physical exam findings, and initial radiographic and laboratory data in the context of their chronic comorbidities is felt to place them at high risk for further clinical deterioration. Furthermore, it is not anticipated that the patient will be medically stable for discharge from the hospital within 2 midnights of admission.   * I certify that at the point of admission it is my clinical judgment that the patient will require inpatient hospital care spanning beyond 2 midnights from the point of admission due to high intensity of service, high risk for further deterioration and high frequency of surveillance required.*   Ambur Province M. DO [facility]  How to contact the [facility] Attending or Consulting provider [place] or covering provider during after hours [place], for this patient?  Check the care team in [facility] and look for a) attending/consulting TR[facility] provider listed and b) the [facility] team listed Log into [email or web address]  [facility] Physician Scheduling and messaging for groups and whole hospitals  On call and physician scheduling software for group practices, residents, hospitalists and other medical providers for call, clinic, rotation and shift schedules. OnCall [employer] is a hospital-wide system for  scheduling doctors and paging doctors on call. EasyPlot is for scientific plotting and data analysis.  [email or web address]  and use [place]'s universal password to access. If you do not have the password, please contact the hospital operator.  Locate the [facility] provider you are looking for under [facility] and page to a number that you can be directly reached. If you still have difficulty reaching the provider, please page the [facility] (Director on Call) for the Hospitalists listed on amion for assistance.  01/19/2021, 3:07 AM

## 2021-01-19 NOTE — Evaluation (Signed)
Occupational Therapy Evaluation Patient Details Name: Nicholas [name] MRN: [id number] DOB: 09-Dec-1933 Today's Date: 01/19/2021   History of Present Illness Pt is an 88 y.o. male admitted 01/18/21 with SOB, wheezing; pt reports testing (+) influenza B on 1/9, but tested (-) on admission. Workup for COPD exacerbation, CHF, potential influenza B. PMH includes CHF, PAF, CKD; of note, recent admission 12/24/20 s/p cardioversion for afib.   Clinical Impression   PT admitted with COPD exacerbation. Pt currently with functional limitiations due to the deficits listed below (see OT problem list). Recommendation for energy conservation and family (A) for medication management. Pt is O2 dependent. Pt able to complete basic transfer and will need rest breaks. Pt self reports inability to walk to the mailbox and back prior to admission. Pt has family (A) that live with him and on the same street per patient.  Pt will benefit from skilled OT to increase their independence and safety with adls and balance to allow discharge [place].       Recommendations for follow up therapy are one component of a multi-disciplinary discharge planning process, led by the attending physician.  Recommendations may be updated based on patient status, additional functional criteria and insurance authorization.   Follow Up Recommendations  Home health OT    Assistance Recommended at Discharge Intermittent Supervision/Assistance  Patient can return home with the following Assist for transportation;Direct supervision/assist for financial management;Direct supervision/assist for medications management;Assistance with cooking/housework;A little help with walking and/or transfers;A little help with bathing/dressing/bathroom    Functional Status Assessment  Patient has had a recent decline in their functional status and demonstrates the ability to make significant improvements in function in a reasonable and predictable amount of time.   Equipment Recommendations  None recommended by OT    Recommendations for Other Services       Precautions / Restrictions Precautions Precautions: Fall Precaution Comments: monitor O2/HR      Mobility Bed Mobility Overal bed mobility: Independent             General bed mobility comments: HOB was slightly elevated and exiting the L side of the bed    Transfers Overall transfer level: Needs assistance Equipment used: None Transfers: Sit to/from Stand Sit to Stand: Supervision                  Balance     Sitting balance-Leahy Scale: Good       Standing balance-Leahy Scale: Fair                             ADL either performed or assessed with clinical judgement   ADL Overall ADL's : Needs assistance/impaired Eating/Feeding: Independent   Grooming: Set up;Sitting   Upper Body Bathing: Set up;Sitting   Lower Body Bathing: Min guard Lower Body Bathing Details (indicate cue type and reason): able to figure 4 cross       Lower Body Dressing Details (indicate cue type and reason): able to figure 4 cross to don doff Toilet Transfer: Supervision/safety   Toileting- Clothing Manipulation and Hygiene: Supervision/safety         General ADL Comments: pt with increased SOB and increased RR with basic transfers.     Vision Baseline Vision/History: 1 Wears glasses Vision Assessment?: No apparent visual deficits     Perception     Praxis      Pertinent Vitals/Pain Pain Assessment: No/denies pain     Hand Dominance Right  Extremity/Trunk Assessment Upper Extremity Assessment Upper Extremity Assessment: Generalized weakness   Lower Extremity Assessment Lower Extremity Assessment: Defer to PT evaluation   Cervical / Trunk Assessment Cervical / Trunk Assessment: Normal   Communication Communication Communication: HOH (speak to R ear)   Cognition Arousal/Alertness: Awake/alert Behavior During Therapy: WFL for tasks  assessed/performed Overall Cognitive Status: Within Functional Limits for tasks assessed                                 General Comments: able to provide the details of this admission including the positive then negative information regarding his Flu test.     General Comments  HR 100 RR 31 increased wheezing sound to breathing. pt self reports no distress or changes to him    Exercises     Shoulder Instructions      Home Living Family/patient expects to be discharged to:: Private residence Living Arrangements: Other relatives (lives in grandson house) Available Help at Discharge: Family;Available 24 hours/day Type of Home: House Home Access: Ramped entrance     Home Layout: One level     Bathroom Shower/Tub: Teacher, early years/pre: Standard     Home Equipment: None   Additional Comments: daughter son and additional grandson live nearby. "i have plenty of help around"      Prior Functioning/Environment Prior Level of Function : Independent/Modified Independent;Driving               ADLs Comments: pt reports doing all bathing dressing. family has a dog but his dog passed recently so no pet care required        OT Problem List: Decreased strength;Impaired balance (sitting and/or standing);Decreased activity tolerance;Decreased cognition;Cardiopulmonary status limiting activity      OT Treatment/Interventions: Self-care/ADL training;Energy conservation;Balance training;Patient/family education    OT Goals(Current goals can be found in the care plan section) Acute Rehab OT Goals Patient Stated Goal: to be able to return home OT Goal Formulation: With patient Time For Goal Achievement: 02/02/21 Potential to Achieve Goals: Good  OT Frequency: Min 2X/week    Co-evaluation              AM-PAC OT "6 Clicks" Daily Activity     Outcome Measure Help from another person eating meals?: None Help from another person taking care of  personal grooming?: A Little Help from another person toileting, which includes using toliet, bedpan, or urinal?: A Little Help from another person bathing (including washing, rinsing, drying)?: None Help from another person to put on and taking off regular upper body clothing?: A Little Help from another person to put on and taking off regular lower body clothing?: A Little 6 Click Score: 20   End of Session Equipment Utilized During Treatment: Oxygen Nurse Communication: Mobility status;Precautions  Activity Tolerance: Patient tolerated treatment well Patient left: in chair;with call bell/phone within reach;with chair alarm set  OT Visit Diagnosis: Unsteadiness on feet (R26.81);Muscle weakness (generalized) (M62.81)                Time: 0350-0938 OT Time Calculation (min): 25 min Charges:  OT General Charges $OT Visit: 1 Visit OT Evaluation $OT Eval Moderate Complexity: 1 Mod   [place], OTR/L  Acute Rehabilitation Services Pager: [phone] Office: [phone] .   Jeri Modena 01/19/2021, 10:29 AM

## 2021-01-19 NOTE — Plan of Care (Signed)
°  Problem: Clinical Measurements: Goal: Will remain free from infection Outcome: Progressing   Problem: Coping: Goal: Level of anxiety will decrease Outcome: Progressing   Problem: Elimination: Goal: Will not experience complications related to bowel motility Outcome: Progressing Goal: Will not experience complications related to urinary retention Outcome: Progressing

## 2021-01-19 NOTE — Plan of Care (Signed)
  Problem: Nutrition: Goal: Adequate nutrition will be maintained Outcome: Progressing   Problem: Coping: Goal: Level of anxiety will decrease Outcome: Progressing   Problem: Elimination: Goal: Will not experience complications related to bowel motility Outcome: Progressing Goal: Will not experience complications related to urinary retention Outcome: Progressing   

## 2021-01-19 NOTE — Progress Notes (Signed)
Initial Nutrition Assessment  DOCUMENTATION CODES:   Non-severe (moderate) malnutrition in context of chronic illness  INTERVENTION:   - Ensure Enlive po BID, each supplement provides 350 kcal and 20 grams of protein  - Liberalize diet to Regular, verbal with readback order placed per MD  - Continue MVI with minerals daily  NUTRITION DIAGNOSIS:   Moderate Malnutrition related to chronic illness (CHF, CKD, COPD) as evidenced by mild fat depletion, moderate muscle depletion.  GOAL:   Patient will meet greater than or equal to 90% of their needs  MONITOR:   PO intake, Supplement acceptance, Labs, Weight trends, I & O's  REASON FOR ASSESSMENT:   Consult COPD Protocol  ASSESSMENT:   88 year old male who presented to the ED on 1/10 with SOB. PMH of CHF, PAF, CKD stage IIIb, HTN, recent cardioversion for atrial fibrillation 5 days ago. Pt admitted with COPD exacerbation.  Spoke with pt and daughter. Pt reports good appetite and states that he consumed 100% of his breakfast this morning due to being hungry from not eating dinner last night. Pt reports that he normally has a good appetite and eats well. Pt's daughter states that pt typically consumes smaller, more frequent meals. Pt has been consuming store-brand oral nutrition supplements at home.  Pt reports a UBW of 185-186 lbs. Pt's daughter reports pt with a UBW of 190 lbs. Pt states that during his last admission in December his weight was up due to volume overload. Reviewed weight history in chart. Pt with a 9.7 kg weight loss since 10/07/20. This is a 10.2% weight loss in less than 4 months which is severe and significant for timeframe. However, unsure how much of weight loss can be attributed to fluid losses given CHF diagnosis and reports of recent volume overload with diuresis.  Pt's daughter shares that pt oscillates between having constipation vs having diarrhea. Pt's daughter reports that they give pt miralax when he is  having constipation and encourage fiber and fluids. Pt's daughter shares that pt has a "pocket" in his esophagus and this can trigger his gag reflex when he is eating but he doesn't actually aspirate or have trouble swallowing.  Pt amenable to receiving oral nutrition supplements during admission. Will continue with MVI with minerals daily. Discussed diet liberalization with MD to maximize PO intake. MD in agreement. Verbal with readback order placed for a Regular diet.  Meal Completion: 50-75%  Medications reviewed and include: MVI with minerals daily, protonix, prednisone  Labs reviewed: BUN 34, creatinine 2.36  UOP: 2100 ml x 24 hours I/O's: -1.6 L since admit  NUTRITION - FOCUSED PHYSICAL EXAM:  Flowsheet Row Most Recent Value  Orbital Region Severe depletion  Upper Arm Region Mild depletion  Thoracic and Lumbar Region Mild depletion  Buccal Region Mild depletion  Temple Region Moderate depletion  Clavicle Bone Region Moderate depletion  Clavicle and Acromion Bone Region Moderate depletion  Scapular Bone Region Moderate depletion  Dorsal Hand Mild depletion  Patellar Region Mild depletion  Anterior Thigh Region Moderate depletion  Posterior Calf Region Mild depletion  Edema (RD Assessment) Mild  Hair Reviewed  Eyes Reviewed  Mouth Reviewed  Skin Reviewed  Nails Reviewed       Diet Order:   Diet Order             Diet regular Room service appropriate? Yes; Fluid consistency: Thin  Diet effective now  EDUCATION NEEDS:   Education needs have been addressed  Skin:  Skin Assessment: Reviewed RN Assessment  Last BM:  01/18/21  Height:   Ht Readings from Last 1 Encounters:  01/18/21 5\' 9"  (1.753 m)    Weight:   Wt Readings from Last 1 Encounters:  01/18/21 85 kg    BMI:  Body mass index is 27.67 kg/m.  Estimated Nutritional Needs:   Kcal:  1800-2000  Protein:  90-110 grams  Fluid:  1.8-2.0 L    Gustavus Bryant, MS, RD,  LDN Inpatient Clinical Dietitian Please see AMiON for contact information.

## 2021-01-19 NOTE — Evaluation (Signed)
Physical Therapy Evaluation Patient Details Name: Nicholas [name] MRN: [id number] DOB: 10-Aug-1933 Today's Date: 01/19/2021  History of Present Illness  Pt is an 88 y.o. male admitted 01/18/21 with SOB, wheezing; pt reports testing (+) influenza B on 1/9, but tested (-) on admission. Workup for COPD exacerbation, CHF, potential influenza B. PMH includes CHF, PAF, CKD; of note, recent admission 12/24/20 s/p cardioversion for afib.   Clinical Impression  Pt presents with an overall decrease in functional mobility secondary to above. PTA, pt reports independent without DME, lives with family who assists as needed. Today, pt moving well with supervision for safety/lines; notable SOB and wheezing; SpO2 >/92% on RA with activity. Pt would benefit from continued acute PT services to maximize functional mobility and independence prior to d/c home. Pt pleasantly declines HHPT services.     SATURATION QUALIFICATIONS:  Patient Saturations on Room Air at Rest = 94% Patient Saturations on Room Air while Ambulating = 92% Patient Saturations on -- Liters of oxygen while Ambulating = N/A   Recommendations for follow up therapy are one component of a multi-disciplinary discharge planning process, led by the attending physician.  Recommendations may be updated based on patient status, additional functional criteria and insurance authorization.  Follow Up Recommendations No PT follow up (declined HHPT)    Assistance Recommended at Discharge Intermittent Supervision/Assistance  Patient can return home with the following  Assistance with cooking/housework;Assist for transportation;A little help with bathing/dressing/bathroom;Direct supervision/assist for financial management;Direct supervision/assist for medications management    Equipment Recommendations None recommended by PT  Recommendations for Other Services       Functional Status Assessment Patient has had a recent decline in their functional status and  demonstrates the ability to make significant improvements in function in a reasonable and predictable amount of time.     Precautions / Restrictions Precautions Precautions: Fall;Other (comment) Precaution Comments: Watch RR/SpO2 (does not wear O2 baseline) - significant wheezing Restrictions Weight Bearing Restrictions: No      Mobility  Bed Mobility Overal bed mobility: Independent             General bed mobility comments: Received sitting in recliner    Transfers Overall transfer level: Needs assistance Equipment used: None Transfers: Sit to/from Stand Sit to Stand: Supervision           General transfer comment: supervision for safety, good power up and self steadying    Ambulation/Gait Ambulation/Gait assistance: Supervision Gait Distance (Feet): 280 Feet Assistive device: None Gait Pattern/deviations: Step-through pattern;Decreased stride length;Trunk flexed Gait velocity: Decreased     General Gait Details: Slow, mostly steady gait without DME, supervision for safety/lines; pt performing quick direction changes, 180' turns, and head turns without overt LOB, able to self-correct instability without assist; pt reports preference for walking with shoes on. Notable SOB and wheezing  Stairs            Wheelchair Mobility    Modified Rankin (Stroke Patients Only)       Balance Overall balance assessment: Needs assistance Sitting-balance support: No upper extremity supported;Feet supported Sitting balance-Leahy Scale: Good     Standing balance support: No upper extremity supported;During functional activity Standing balance-Leahy Scale: Good                               Pertinent Vitals/Pain Pain Assessment: No/denies pain Pain Intervention(s): Monitored during session    Home Living Family/patient expects to be discharged to:: Private residence  Living Arrangements: Other relatives (lives in grandson house) Available Help at  Discharge: Family;Available 24 hours/day Type of Home: House Home Access: Ramped entrance       Home Layout: One level Home Equipment: None Additional Comments: Lives at [employer]; reports daughter, son, and additional grandson live nearby. "i have plenty of help around"    Prior Function Prior Level of Function : Independent/Modified Independent;Driving             Mobility Comments: Independent ambulating without DME ADLs Comments: Independent with bathing and dressing. Family has a dog but his dog passed recently so no pet care required     Hand Dominance   Dominant Hand: Right    Extremity/Trunk Assessment   Upper Extremity Assessment Upper Extremity Assessment: Generalized weakness    Lower Extremity Assessment Lower Extremity Assessment: Overall WFL for tasks assessed    Cervical / Trunk Assessment Cervical / Trunk Assessment: Normal  Communication   Communication: HOH (speak to R ear)  Cognition Arousal/Alertness: Awake/alert Behavior During Therapy: WFL for tasks assessed/performed Overall Cognitive Status: Within Functional Limits for tasks assessed                                 General Comments: able to provide the details of this admission including the positive then negative information regarding his Flu test.        General Comments General comments (skin integrity, edema, etc.): SpO2 >/92% on RA with activity; notable SOB and wheezing. Discussed recommendation for HHPT services, pt pleasantly declining, reports he has necessary assist at home    Exercises     Assessment/Plan    PT Assessment Patient needs continued PT services  PT Problem List Decreased activity tolerance;Decreased balance;Cardiopulmonary status limiting activity       PT Treatment Interventions Gait training;Stair training;Functional mobility training;Therapeutic activities;Therapeutic exercise;Balance training;Patient/family education    PT Goals  (Current goals can be found in the Care Plan section)  Acute Rehab PT Goals Patient Stated Goal: return home PT Goal Formulation: With patient Time For Goal Achievement: 02/02/21 Potential to Achieve Goals: Good    Frequency Min 3X/week     Co-evaluation               AM-PAC PT "6 Clicks" Mobility  Outcome Measure Help needed turning from your back to your side while in a flat bed without using bedrails?: None Help needed moving from lying on your back to sitting on the side of a flat bed without using bedrails?: None Help needed moving to and from a bed to a chair (including a wheelchair)?: A Little Help needed standing up from a chair using your arms (e.g., wheelchair or bedside chair)?: A Little Help needed to walk in hospital room?: A Little Help needed climbing 3-5 steps with a railing? : A Little 6 Click Score: 20    End of Session   Activity Tolerance: Patient tolerated treatment well Patient left: in chair;with call bell/phone within reach;with chair alarm set Nurse Communication: Mobility status PT Visit Diagnosis: Other abnormalities of gait and mobility (R26.89)    Time: 1761-6073 PT Time Calculation (min) (ACUTE ONLY): 18 min   Charges:   PT Evaluation $PT Eval Moderate Complexity: 1 Mod        Mabeline Caras, PT, DPT Acute Rehabilitation Services  Pager [phone] Office [place] 01/19/2021, 11:44 AM

## 2021-01-19 NOTE — Progress Notes (Signed)
Pt seen and examined at bedside.  Pt seen by Dr Alcario Drought earlier this am.  Nicholas [name] is a 88 y.o. male with medical history significant of chronic combined CHF, PAF, CKD 3b s/p cardioversion for A.Fib at hospital x5 days ago. Since discharge pt developed wheezing, SOB, over the past week.  Symptoms persistent and worsening. Went to [facility] yesterday 1/9, tested positive for influenza B per report (cant actually see that visit).  Got steroids, doxycycline, and antiviral Rx.   He was started on IV lasix 60 mg daily.  Taper steroids and continue with bronchodilators.  Monitor.    Hosie Poisson, MD

## 2021-01-20 ENCOUNTER — Inpatient Hospital Stay (HOSPITAL_COMMUNITY): Payer: PPO

## 2021-01-20 DIAGNOSIS — I5043 Acute on chronic combined systolic (congestive) and diastolic (congestive) heart failure: Secondary | ICD-10-CM | POA: Diagnosis not present

## 2021-01-20 DIAGNOSIS — Z7189 Other specified counseling: Secondary | ICD-10-CM

## 2021-01-20 DIAGNOSIS — R0609 Other forms of dyspnea: Secondary | ICD-10-CM

## 2021-01-20 DIAGNOSIS — I5042 Chronic combined systolic (congestive) and diastolic (congestive) heart failure: Secondary | ICD-10-CM | POA: Diagnosis not present

## 2021-01-20 DIAGNOSIS — I482 Chronic atrial fibrillation, unspecified: Secondary | ICD-10-CM | POA: Diagnosis not present

## 2021-01-20 DIAGNOSIS — Z515 Encounter for palliative care: Secondary | ICD-10-CM

## 2021-01-20 DIAGNOSIS — N184 Chronic kidney disease, stage 4 (severe): Secondary | ICD-10-CM | POA: Diagnosis not present

## 2021-01-20 DIAGNOSIS — J441 Chronic obstructive pulmonary disease with (acute) exacerbation: Secondary | ICD-10-CM | POA: Diagnosis not present

## 2021-01-20 LAB — ECHOCARDIOGRAM COMPLETE
AR max vel: 1.75 cm2
AV Area VTI: 1.68 cm2
AV Area mean vel: 1.65 cm2
AV Mean grad: 15 mmHg
AV Peak grad: 20.3 mmHg
Ao pk vel: 2.25 m/s
Area-P 1/2: 6.17 cm2
Calc EF: 32.1 %
Height: 69 in
MV M vel: 4.77 m/s
MV Peak grad: 91 mmHg
S' Lateral: 5.6 cm
Single Plane A2C EF: 32.9 %
Single Plane A4C EF: 32.1 %
Weight: 2998.26 oz

## 2021-01-20 LAB — BASIC METABOLIC PANEL
Anion gap: 12 (ref 5–15)
BUN: 46 mg/dL — ABNORMAL HIGH (ref 8–23)
CO2: 23 mmol/L (ref 22–32)
Calcium: 8.7 mg/dL — ABNORMAL LOW (ref 8.9–10.3)
Chloride: 100 mmol/L (ref 98–111)
Creatinine, Ser: 2.35 mg/dL — ABNORMAL HIGH (ref 0.61–1.24)
GFR, Estimated: 26 mL/min — ABNORMAL LOW (ref >60)
Glucose, Bld: 107 mg/dL — ABNORMAL HIGH (ref 70–99)
Potassium: 4.2 mmol/L (ref 3.5–5.1)
Sodium: 135 mmol/L (ref 135–145)

## 2021-01-20 LAB — CBC WITH DIFFERENTIAL/PLATELET
Abs Immature Granulocytes: 0.23 10*3/uL — ABNORMAL HIGH (ref 0.00–0.07)
Basophils Absolute: 0 10*3/uL (ref 0.0–0.1)
Basophils Relative: 0 %
Eosinophils Absolute: 0 10*3/uL (ref 0.0–0.5)
Eosinophils Relative: 0 %
HCT: 35.8 % — ABNORMAL LOW (ref 39.0–52.0)
Hemoglobin: 11.1 g/dL — ABNORMAL LOW (ref 13.0–17.0)
Immature Granulocytes: 2 %
Lymphocytes Relative: 18 %
Lymphs Abs: 2.5 10*3/uL (ref 0.7–4.0)
MCH: 22.4 pg — ABNORMAL LOW (ref 26.0–34.0)
MCHC: 31 g/dL (ref 30.0–36.0)
MCV: 72.3 fL — ABNORMAL LOW (ref 80.0–100.0)
Monocytes Absolute: 0.8 10*3/uL (ref 0.1–1.0)
Monocytes Relative: 5 %
Neutro Abs: 10.5 10*3/uL — ABNORMAL HIGH (ref 1.7–7.7)
Neutrophils Relative %: 75 %
Platelets: 495 10*3/uL — ABNORMAL HIGH (ref 150–400)
RBC: 4.95 MIL/uL (ref 4.22–5.81)
RDW: 21.1 % — ABNORMAL HIGH (ref 11.5–15.5)
WBC: 14.1 10*3/uL — ABNORMAL HIGH (ref 4.0–10.5)
nRBC: 0 % (ref 0.0–0.2)

## 2021-01-20 MED ORDER — IPRATROPIUM-ALBUTEROL 0.5-2.5 (3) MG/3ML IN SOLN
3.0000 mL | RESPIRATORY_TRACT | Status: DC | PRN
  Administered 2021-01-20 – 2021-01-21 (×2): 3 mL via RESPIRATORY_TRACT
  Filled 2021-01-20 (×2): qty 3

## 2021-01-20 MED ORDER — PERFLUTREN LIPID MICROSPHERE
1.0000 mL | INTRAVENOUS | Status: AC | PRN
  Administered 2021-01-20: 2 mL via INTRAVENOUS
  Filled 2021-01-20: qty 10

## 2021-01-20 MED ORDER — METHYLPREDNISOLONE SODIUM SUCC 125 MG IJ SOLR
60.0000 mg | Freq: Every day | INTRAMUSCULAR | Status: DC
  Administered 2021-01-20 – 2021-01-23 (×4): 60 mg via INTRAVENOUS
  Filled 2021-01-20 (×4): qty 2

## 2021-01-20 NOTE — Consult Note (Signed)
Consultation Note Date: 01/20/2021   Patient Name: Nicholas [name]  DOB: 1933-06-24  MRN: [id number]  Age / Sex: 88 y.o., male  PCP: Street, [name], MD Referring Physician: Hosie Poisson, MD  Reason for Consultation:  GOC  HPI/Patient Profile: 88 y.o. male  with past medical history of COPD, CHF, a fib s/p cardioversion, CKD admitted on 01/18/2021 with wheezing, SOB, positive flu prior to admission, but negative in ED, COPD exacerbation. Palliative consulted for [place].    Primary Decision Maker HCPOA- Durenda Age  Discussion: I have reviewed medical records including EPIC notes, labs and imaging, assessed the patient and then met with his HCPOA- Durenda Age  to discuss diagnosis prognosis, GOC, EOL wishes, disposition and options.  I introduced Palliative Medicine as specialized medical care for people living with serious illness. It focuses on providing relief from the symptoms and stress of a serious illness. The goal is to improve quality of life for both the patient and the family.  We discussed a brief life review of the patient. 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 Over 9 million miles with no accidents. He used to race cars and enjoys watching racing on television.  As far as functional and nutritional status- he is independent for ADL's. He gets a bit SOB walking to the mailbox. Is not on home oxygen. Has had some decline in the last month due to the several setbacks he has had with his chronic and acute illnesses.     We discussed patient's current illness and what it means in the larger context of patient's on-going co-morbidities.  Natural disease trajectory and expectations at EOL were discussed.   Advance directives, concepts specific to code status, artificial feeding and hydration, and rehospitalization were considered and discussed.  [name] shares that  [place] values being independent. He would want to treat any reversible illnesses, but if he had a catastrophic event, he would not want to prolong his life in a debilitated state. He has a living will, and a copy of this has been requested.   Code status was discussed- Kmari would not want CPR in the event of cardiac arrest. He also would not want to be put on a ventilator.    Discussed with patient/family the importance of continued conversation with family and the medical providers regarding overall plan of care and treatment options, ensuring decisions are within the context of the patients values and GOCs.    Palliative Care services outpatient were explained and offered. [name] would like continued followup from Palliative in the community.   Questions and concerns were addressed. The family was encouraged to call with questions or concerns.   SUMMARY OF RECOMMENDATIONS  -COPD exacerbation in the setting of positive influenza and multiple chronic illness exacerbations- plan for continued treatment with goal of returning home -DNR -[place] document complete in [facility]  -TOC referral for outpatient Palliative  Code Status/Advance Care Planning: DNR   Prognosis:   Unable to determine  Discharge Planning: Home with  Palliative Services  Primary Diagnoses: Present on Admission:  COPD exacerbation ([place])  Stage 3b chronic kidney disease (CKD) (HCC)  A-fib (HCC)  HTN (hypertension)  Chronic combined systolic and diastolic CHF (congestive heart failure) (HCC)  Influenza B   Review of Systems  Physical Exam  Vital Signs: BP 121/75 (BP Location: Right Arm)    Pulse 91    Temp 98 F (36.7 C) (Oral)    Resp 19    Ht _0  (1.753 m)    Wt 85 kg    SpO2 96%    BMI 27.67 kg/m  Pain Scale: 0-10 POSS *See Group Information*: 1-Acceptable,Awake and alert Pain Score: 0-No pain   SpO2: SpO2: 96 % O2 Device:SpO2: 96 % O2 Flow Rate: .O2 Flow Rate (L/min): 3 L/min  IO: Intake/output  summary:  Intake/Output Summary (Last 24 hours) at 01/20/2021 1656 Last data filed at 01/20/2021 1500 Gross per 24 hour  Intake 960 ml  Output 2275 ml  Net -1315 ml    LBM: Last BM Date: 01/19/21 Baseline Weight: Weight: 85 kg Most recent weight: Weight: 85 kg     Palliative Assessment/Data: PPS: 70%       Thank you for this consult. Palliative medicine will continue to follow and assist as needed.    Signed by: Mariana Kaufman, AGNP-C Palliative Medicine    Please contact Palliative Medicine Team phone at [phone] for questions and concerns.  For individual provider: See Shea Evans

## 2021-01-20 NOTE — Progress Notes (Signed)
PROGRESS NOTE    Nicholas [name]  MVH:[id number] DOB: March 14, 1933 DOA: 01/18/2021 PCP: Street, [name], MD  Chief Complaint  Patient presents with   Shortness of Breath    Brief Narrative:   Nicholas [name] is a 88 y.o. male with medical history significant of chronic combined CHF, PAF, CKD 3b s/p cardioversion for A.Fib at hospital x5 days ago. Since discharge pt developed wheezing, SOB, over the past week.  Symptoms persistent and worsening. Went to [facility] yesterday 1/9, tested positive for influenza B per report .Got steroids, doxycycline, and antiviral Rx. Pt presents with worsening sob. He was admitted for acute respiratory failure with hypoxia from acute copd exacerbation.   Assessment & Plan:   Principal Problem:   COPD exacerbation ([place]) Active Problems:   HTN (hypertension)   Stage 3b chronic kidney disease (CKD) (HCC)   Chronic combined systolic and diastolic CHF (congestive heart failure) (HCC)   A-fib (HCC)   Influenza B   Acute respiratory failure with hypoxia sec to acute copd exacerbation and acute on chronic combined systolic and diastolic CHF.  Still requiring 2 lit of [place] oxygen, diffusely wheezing, .  Transition to IV solumedrol today 60 mg daily, add duonebs.  Continue with IV lasix,  Continue with strict intake and output, monitor daily weights and renal parameters while on IV lasix.  Potassium wnl.  Therapy evaluations recommending HOME HEALTH PT.     Stage 3 b CKD: Creatinine stable around 2.3.    Atrial fibrillation:  S/p cardioversion.  On eliquis for anticoagulation, and metoprolol for rate control.   Influenza:  Complete the course of tamiflu.   Hypertension:  Well controlled   Leukocytosis:  From steroids.    Diarrhea possibly from antibiotics.  Monitor.   DVT prophylaxis: on eliquis.  Code Status: FULL CODE.  Family Communication: daughter at bedside.  Disposition:   Status is: Inpatient  Remains inpatient appropriate because:  WHEEZING and acute resp failure with hypoxia requiring 2lit/min.        Consultants:  None.   Procedures: none.   Antimicrobials: none.    Subjective: Diffusely wheezing.   Objective: Vitals:   01/20/21 0752 01/20/21 0828 01/20/21 1018 01/20/21 1211  BP: (!) 112/28  113/86 119/82  Pulse: 95  92 92  Resp: (!) 22   (!) 21  Temp: 97.6 F (36.4 C)   97.6 F (36.4 C)  TempSrc: Oral   Oral  SpO2: 92% 94%  96%  Weight:      Height:        Intake/Output Summary (Last 24 hours) at 01/20/2021 1218 Last data filed at 01/20/2021 1212 Gross per 24 hour  Intake 960 ml  Output 2025 ml  Net -1065 ml   Filed Weights   01/18/21 2330  Weight: 85 kg    Examination:  General exam: Appears calm and comfortable  Respiratory system: Clear to auscultation. Respiratory effort normal. Cardiovascular system: S1 & S2 heard, RRR. No JVD, . No pedal edema. Gastrointestinal system: Abdomen is nondistended, soft and nontender.  Normal bowel sounds heard. Central nervous system: Alert and oriented. No focal neurological deficits. Extremities: Symmetric 5 x 5 power. Skin: No rashes, lesions or ulcers Psychiatry:  Mood & affect appropriate.     Data Reviewed: I have personally reviewed following labs and imaging studies  CBC: Recent Labs  Lab 01/18/21 1655 01/18/21 1938 01/20/21 0104  WBC 11.5*  --  14.1*  NEUTROABS  --   --  10.5*  HGB 10.8*  12.6* 11.1*  HCT 35.5* 37.0* 35.8*  MCV 72.6*  --  72.3*  PLT 471*  --  495*    Basic Metabolic Panel: Recent Labs  Lab 01/18/21 1655 01/18/21 1938 01/19/21 0543 01/20/21 0104  NA 137 137 136 135  K 5.0 4.9 4.5 4.2  CL 104  --  103 100  CO2 22  --  23 23  GLUCOSE 184*  --  128* 107*  BUN 37*  --  34* 46*  CREATININE 2.35*  --  2.36* 2.35*  CALCIUM 9.0  --  8.7* 8.7*    GFR: Estimated Creatinine Clearance: 24.8 mL/min (A) (by C-G formula based on SCr of 2.35 mg/dL (H)).  Liver Function Tests: Recent Labs  Lab  01/18/21 1655  AST 19  ALT 15  ALKPHOS 88  BILITOT 0.5  PROT 6.4*  ALBUMIN 3.5    CBG: No results for input(s): GLUCAP in the last 168 hours.   Recent Results (from the past 240 hour(s))  Resp Panel by RT-PCR (Flu A&B, Covid) Nasopharyngeal Swab     Status: None   Collection Time: 01/18/21  5:00 PM   Specimen: Nasopharyngeal Swab; Nasopharyngeal(NP) swabs in vial transport medium  Result Value Ref Range Status   SARS Coronavirus 2 by RT PCR NEGATIVE NEGATIVE Final    Comment: (NOTE) SARS-CoV-2 target nucleic acids are NOT DETECTED.  The SARS-CoV-2 RNA is generally detectable in upper respiratory specimens during the acute phase of infection. The lowest concentration of SARS-CoV-2 viral copies this assay can detect is 138 copies/mL. A negative result does not preclude SARS-Cov-2 infection and should not be used as the sole basis for treatment or other patient management decisions. A negative result may occur with  improper specimen collection/handling, submission of specimen other than nasopharyngeal swab, presence of viral mutation(s) within the areas targeted by this assay, and inadequate number of viral copies(<138 copies/mL). A negative result must be combined with clinical observations, patient history, and epidemiological information. The expected result is Negative.  Fact Sheet for Patients:  EntrepreneurPulse.com.au  Fact Sheet for Healthcare Providers:  IncredibleEmployment.be  This test is no t yet approved or cleared by the [place] FDA and  has been authorized for detection and/or diagnosis of SARS-CoV-2 by FDA under an Emergency Use Authorization (EUA). This EUA will remain  in effect (meaning this test can be used) for the duration of the COVID-19 declaration under Section 564(b)(1) of the Act, 21 U.S.C.section 360bbb-3(b)(1), unless the authorization is terminated  or revoked sooner.       Influenza A by PCR  NEGATIVE NEGATIVE Final   Influenza B by PCR NEGATIVE NEGATIVE Final    Comment: (NOTE) The Xpert Xpress SARS-CoV-2/FLU/RSV plus assay is intended as an aid in the diagnosis of influenza from Nasopharyngeal swab specimens and should not be used as a sole basis for treatment. Nasal washings and aspirates are unacceptable for Xpert Xpress SARS-CoV-2/FLU/RSV testing.  Fact Sheet for Patients: EntrepreneurPulse.com.au  Fact Sheet for Healthcare Providers: IncredibleEmployment.be  This test is not yet approved or cleared by the [place] FDA and has been authorized for detection and/or diagnosis of SARS-CoV-2 by FDA under an Emergency Use Authorization (EUA). This EUA will remain in effect (meaning this test can be used) for the duration of the COVID-19 declaration under Section 564(b)(1) of the Act, 21 U.S.C. section 360bbb-3(b)(1), unless the authorization is terminated or revoked.  Performed at [facility], [address], [place],  [address]   MRSA Next Gen  by PCR, Nasal     Status: None   Collection Time: 01/18/21 11:40 PM   Specimen: Nasal Mucosa; Nasal Swab  Result Value Ref Range Status   MRSA by PCR Next Gen NOT DETECTED NOT DETECTED Final    Comment: (NOTE) The GeneXpert MRSA Assay (FDA approved for NASAL specimens only), is one component of a comprehensive MRSA colonization surveillance program. It is not intended to diagnose MRSA infection nor to guide or monitor treatment for MRSA infections. Test performance is not FDA approved in patients less than 57 years old. Performed at [facility], [place] [address] [place], [place] [address]          Radiology Studies: DG Chest Port 1 View  Result Date: 01/18/2021 CLINICAL DATA:  Shortness of breath EXAM: PORTABLE CHEST 1 VIEW COMPARISON:  Chest x-ray dated December 24, 2020 FINDINGS: Possible increased cardiomegaly. Small right pleural effusion  and mild bibasilar opacities. No evidence of pneumothorax. IMPRESSION: 1. Possible increased cardiomegaly, concerning for pericardial effusion. Recommend echocardiography for further evaluation. 2. Small right pleural effusion and mild bibasilar opacities which likely represent atelectasis. Electronically Signed   By: Yetta Glassman M.D.   On: 01/18/2021 17:16        Scheduled Meds:  allopurinol  100 mg Oral Daily   amiodarone  400 mg Oral BID   amLODipine  5 mg Oral Daily   apixaban  2.5 mg Oral BID   feeding supplement  237 mL Oral BID BM   furosemide  60 mg Intravenous Daily   latanoprost  1 drop Both Eyes QHS   methylPREDNISolone (SOLU-MEDROL) injection  60 mg Intravenous Daily   metoprolol succinate  12.5 mg Oral Daily   mometasone-formoterol  2 puff Inhalation BID   multivitamin with minerals  1 tablet Oral q morning   oseltamivir  30 mg Oral Daily   pantoprazole  20 mg Oral Daily   umeclidinium bromide  1 puff Inhalation Daily   Continuous Infusions:   LOS: 2 days    Time spent: 36 minutes.     Hosie Poisson, MD [facility]   To contact the attending provider between 7A-7P or the covering provider during after hours 7P-7A, please log into the web site [email or web address] and access using universal [place] password for that web site. If you do not have the password, please call the hospital operator.  01/20/2021, 12:18 PM

## 2021-01-20 NOTE — Progress Notes (Signed)
Physical Therapy Treatment Patient Details Name: Nicholas [name] MRN: [id number] DOB: November 03, 1933 Today's Date: 01/20/2021   History of Present Illness Pt is an 88 y.o. male admitted 01/18/21 with SOB, wheezing; pt reports testing (+) influenza B on 1/9, but tested (-) on admission. Workup for COPD exacerbation, CHF, potential influenza B. PMH includes CHF, PAF, CKD; of note, recent admission 12/24/20 s/p cardioversion for afib.   PT Comments    Pt progressing with mobility. Today's session focused on ambulation for improving activity tolerance and strength. Pt still with increased respiration rate and wheezing (at rest and with activity), but pt notes some improvement compared to yesterday. Pt happy to ambulate for first time today. Will continue to follow acutely to address established goals.  SATURATION QUALIFICATIONS:  Patient Saturations on Room Air at Rest = 94% Patient Saturations on [employer] while Ambulating = 89% Patient Saturations on -- Liters of oxygen while Ambulating = N/A    Recommendations for follow up therapy are one component of a multi-disciplinary discharge planning process, led by the attending physician.  Recommendations may be updated based on patient status, additional functional criteria and insurance authorization.  Follow Up Recommendations  No PT follow up (pt declined HHPT)     Assistance Recommended at Discharge Intermittent Supervision/Assistance  Patient can return home with the following Assistance with cooking/housework;Assist for transportation;A little help with bathing/dressing/bathroom;Direct supervision/assist for financial management;Direct supervision/assist for medications management   Equipment Recommendations  None recommended by PT    Recommendations for Other Services       Precautions / Restrictions Precautions Precautions: Fall;Other (comment) Precaution Comments: Watch RR/SpO2 (does not wear O2 baseline) - significant  wheezing Restrictions Weight Bearing Restrictions: No     Mobility  Bed Mobility Overal bed mobility: Modified Independent                  Transfers Overall transfer level: Needs assistance Equipment used: None Transfers: Sit to/from Stand Sit to Stand: Supervision           General transfer comment: supervision for safety, good power up and self steadying    Ambulation/Gait Ambulation/Gait assistance: Supervision Gait Distance (Feet): 280 Feet Assistive device: None Gait Pattern/deviations: Step-through pattern;Decreased stride length;Trunk flexed Gait velocity: Decreased     General Gait Details: Slow, mostly steady gait without DME, supervision for safety/lines; 2x self-corrected instability; notable SOB and wheezing, although seems decreased from prior session   Stairs             Wheelchair Mobility    Modified Rankin (Stroke Patients Only)       Balance Overall balance assessment: Needs assistance Sitting-balance support: No upper extremity supported;Feet supported Sitting balance-Leahy Scale: Good     Standing balance support: No upper extremity supported;During functional activity Standing balance-Leahy Scale: Fair                              Cognition Arousal/Alertness: Awake/alert Behavior During Therapy: WFL for tasks assessed/performed Overall Cognitive Status: Within Functional Limits for tasks assessed                                          Exercises      General Comments General comments (skin integrity, edema, etc.): SpO2 >/89% on RA with activity; pt endorses some improvement in SOB  Pertinent Vitals/Pain Pain Assessment: No/denies pain Pain Intervention(s): Monitored during session    Home Living                          Prior Function            PT Goals (current goals can now be found in the care plan section) Progress towards PT goals: Progressing toward  goals    Frequency    Min 3X/week      PT Plan Current plan remains appropriate    Co-evaluation              AM-PAC PT "6 Clicks" Mobility   Outcome Measure  Help needed turning from your back to your side while in a flat bed without using bedrails?: None Help needed moving from lying on your back to sitting on the side of a flat bed without using bedrails?: None Help needed moving to and from a bed to a chair (including a wheelchair)?: A Little Help needed standing up from a chair using your arms (e.g., wheelchair or bedside chair)?: A Little Help needed to walk in hospital room?: A Little Help needed climbing 3-5 steps with a railing? : A Little 6 Click Score: 20    End of Session   Activity Tolerance: Patient tolerated treatment well Patient left: in bed;with call bell/phone within reach Nurse Communication: Mobility status PT Visit Diagnosis: Other abnormalities of gait and mobility (R26.89)     Time: 5809-9833 PT Time Calculation (min) (ACUTE ONLY): 12 min  Charges:  $Therapeutic Exercise: 8-22 mins                     Mabeline Caras, PT, DPT Acute Rehabilitation Services  Pager [phone] Office [place] 01/20/2021, 4:54 PM

## 2021-01-21 DIAGNOSIS — J9601 Acute respiratory failure with hypoxia: Secondary | ICD-10-CM

## 2021-01-21 DIAGNOSIS — I480 Paroxysmal atrial fibrillation: Secondary | ICD-10-CM

## 2021-01-21 LAB — CBC WITH DIFFERENTIAL/PLATELET
Abs Immature Granulocytes: 0.31 10*3/uL — ABNORMAL HIGH (ref 0.00–0.07)
Basophils Absolute: 0 10*3/uL (ref 0.0–0.1)
Basophils Relative: 0 %
Eosinophils Absolute: 0 10*3/uL (ref 0.0–0.5)
Eosinophils Relative: 0 %
HCT: 36.3 % — ABNORMAL LOW (ref 39.0–52.0)
Hemoglobin: 11.3 g/dL — ABNORMAL LOW (ref 13.0–17.0)
Immature Granulocytes: 3 %
Lymphocytes Relative: 27 %
Lymphs Abs: 3.3 10*3/uL (ref 0.7–4.0)
MCH: 22.3 pg — ABNORMAL LOW (ref 26.0–34.0)
MCHC: 31.1 g/dL (ref 30.0–36.0)
MCV: 71.7 fL — ABNORMAL LOW (ref 80.0–100.0)
Monocytes Absolute: 0.3 10*3/uL (ref 0.1–1.0)
Monocytes Relative: 2 %
Neutro Abs: 8.5 10*3/uL — ABNORMAL HIGH (ref 1.7–7.7)
Neutrophils Relative %: 68 %
Platelets: 486 10*3/uL — ABNORMAL HIGH (ref 150–400)
RBC: 5.06 MIL/uL (ref 4.22–5.81)
RDW: 20.7 % — ABNORMAL HIGH (ref 11.5–15.5)
WBC: 12.4 10*3/uL — ABNORMAL HIGH (ref 4.0–10.5)
nRBC: 0 % (ref 0.0–0.2)

## 2021-01-21 LAB — BASIC METABOLIC PANEL
Anion gap: 8 (ref 5–15)
BUN: 54 mg/dL — ABNORMAL HIGH (ref 8–23)
CO2: 24 mmol/L (ref 22–32)
Calcium: 8.3 mg/dL — ABNORMAL LOW (ref 8.9–10.3)
Chloride: 101 mmol/L (ref 98–111)
Creatinine, Ser: 2.41 mg/dL — ABNORMAL HIGH (ref 0.61–1.24)
GFR, Estimated: 26 mL/min — ABNORMAL LOW (ref >60)
Glucose, Bld: 174 mg/dL — ABNORMAL HIGH (ref 70–99)
Potassium: 4 mmol/L (ref 3.5–5.1)
Sodium: 133 mmol/L — ABNORMAL LOW (ref 135–145)

## 2021-01-21 MED ORDER — GUAIFENESIN-DM 100-10 MG/5ML PO SYRP
5.0000 mL | ORAL_SOLUTION | ORAL | Status: DC | PRN
  Administered 2021-01-21 (×2): 5 mL via ORAL
  Filled 2021-01-21 (×2): qty 5

## 2021-01-21 MED ORDER — FUROSEMIDE 10 MG/ML IJ SOLN
60.0000 mg | Freq: Two times a day (BID) | INTRAMUSCULAR | Status: DC
  Administered 2021-01-21 – 2021-01-24 (×6): 60 mg via INTRAVENOUS
  Filled 2021-01-21 (×6): qty 6

## 2021-01-21 MED ORDER — DM-GUAIFENESIN ER 30-600 MG PO TB12
1.0000 | ORAL_TABLET | Freq: Two times a day (BID) | ORAL | Status: DC
  Administered 2021-01-21 – 2021-01-28 (×15): 1 via ORAL
  Filled 2021-01-21 (×15): qty 1

## 2021-01-21 NOTE — Progress Notes (Signed)
Mobility Specialist Progress Note    01/21/21 1211  Mobility  Activity Contraindicated/medical hold   RN advised to hold off as pt experiencing afib. Will f/u later this pm as schedule permits.   [facility] Mobility Specialist  M.S. 2C and 6E: [phone] M.S. 4E: [phone]

## 2021-01-21 NOTE — Plan of Care (Signed)

## 2021-01-21 NOTE — Consult Note (Addendum)
Advanced Heart Failure Team Consult Note   Primary Physician: Street, [name], MD PCP-Cardiologist:  Shirlee More, MD  Reason for Consultation: Heart Failure/ A fib   HPI:    Nicholas [name] is seen today for evaluation of  heart failure at the request of Dr Karleen Hampshire.   Nicholas [name] is a 88 year old with history of CKD Stage IIIB, A flutter, HFmEF, HTN, TIA, IDA, and gout. He has not had an ischemic work up.    He was first noted to be in A fib on 09/22/20. Referred to Dr Bettina Gavia for A fib. Cardioversion was delayed due to anemia.    Saw Hematology on 11/04/20. Iron sats 4%. Had 2 iron transfusions. GI follow up was recommended.    Presented for scheduled cardioversion on 12/24/20 but procedure cancelled due to volume overload. Diuresed with IV lasix and loaded on amio . Had successful DC-CV on 12/30/20. Echo completed--->EF down from 55-60% to 40-45%. Suspect EF lower given A flutter RVR. Amio taper was in place. Discharged on 12/22. Discharge weight 191 pounds.   He was seen in the HF [facility] clinic 01/12/20. EKG  A Fib with rate 98. Had volume overload so lasix was increased. He was set up for DC-CV.   On 01/14/21 he presented for cardioversion. On exam he was wheezing. Prior to procedure he was given IV lasix and nebulizer.  Later had successful cardioversion on 01/15/20.   He was seen in Urgent Care 01/17/21 for cough and wheezing. Test  + infuenza B and started on Tamiflu.   Presented  to the ED 01/18/21 with increased shortness of breath and wheezing. Admitted with acute hypoxic respiratory failure, AECOPD + A/C HF. BNP 2417, negative flu, negative SARS2. Started on solumedrol and diuresed with IV lasix. Had been SR until today. Given duoneb and went in A fib. EKG obtained to confirm.   Feeling much better . Denies SOB.    Cardiac Testing  Echo 12/2020 EF 40-45%  Echo 2020 EF 55-60%  Echo 2019 Ef 50-55%    Review of Systems: [y] = yes, [ ]  = no   General: Weight gain [ ] ; Weight loss  [ ] ; Anorexia [ ] ; Fatigue [Y ]; Fever [ ] ; Chills [ ] ; Weakness [ ]   Cardiac: Chest pain/pressure [ ] ; Resting SOB [ ] ; Exertional SOB [ Y]; Orthopnea [ ] ; Pedal Edema [ ] ; Palpitations [ ] ; Syncope [ ] ; Presyncope [ ] ; Paroxysmal nocturnal dyspnea[ ]   Pulmonary: Cough [ Y]; Wheezing[ Y]; Hemoptysis[ ] ; Sputum [ ] ; Snoring [ ]   GI: Vomiting[ ] ; Dysphagia[ ] ; Melena[ ] ; Hematochezia [ ] ; Heartburn[ ] ; Abdominal pain [ ] ; Constipation [ ] ; Diarrhea [ ] ; BRBPR [ ]   GU: Hematuria[ ] ; Dysuria [ ] ; Nocturia[ ]   Vascular: Pain in legs with walking [ ] ; Pain in feet with lying flat [ ] ; Non-healing sores [ ] ; Stroke [ ] ; TIA [ ] ; Slurred speech [ ] ;  Neuro: Headaches[ ] ; Vertigo[ ] ; Seizures[ ] ; Paresthesias[ ] ;Blurred vision [ ] ; Diplopia [ ] ; Vision changes [ ]   Ortho/Skin: Arthritis [ ] ; Joint pain [ Y]; Muscle pain [ ] ; Joint swelling [ ] ; Back Pain [ ] ; Rash [ ]   Psych: Depression[ ] ; Anxiety[ ]   Heme: Bleeding problems [ ] ; Clotting disorders [ ] ; Anemia [ Y]  Endocrine: Diabetes [ ] ; Thyroid dysfunction[ ]   Home Medications Prior to Admission medications   Medication Sig Start Date End Date Taking? Authorizing Provider  acetaminophen (TYLENOL) 500  MG tablet Take 500-1,000 mg by mouth every 8 (eight) hours as needed for mild pain or headache.   Yes [provider]  albuterol (PROVENTIL HFA;VENTOLIN HFA) 108 (90 Base) MCG/ACT inhaler Inhale 2 puffs into the lungs every 4 (four) hours as needed for wheezing or shortness of breath.   Yes [provider]  albuterol (PROVENTIL) (2.5 MG/3ML) 0.083% nebulizer solution Take 2.5 mg by nebulization every 4 (four) hours as needed for wheezing or shortness of breath.   Yes [provider]  allopurinol (ZYLOPRIM) 100 MG tablet Take 100 mg by mouth daily.   Yes [provider]  amiodarone (PACERONE) 200 MG tablet Take 2 tablets (400 mg total) by mouth 2 (two) times daily. 01/11/21  Yes Clegg, Amy D, NP  amLODipine (NORVASC) 5  MG tablet Take 5 mg by mouth daily. 10/11/20  Yes [provider]  apixaban (ELIQUIS) 2.5 MG TABS tablet Take 1 tablet (2.5 mg total) by mouth 2 (two) times daily. 07/21/18  Yes Marcelyn Bruins, MD  Calcium Carb-Cholecalciferol (CALCIUM 600+D3 PO) Take 2 tablets by mouth daily.   Yes [provider]  doxycycline (VIBRAMYCIN) 100 MG capsule Take 100 mg by mouth 2 (two) times daily. For 5 days   Yes [provider]  Ensure (ENSURE) Take 237 mLs by mouth daily.   Yes [provider]  Fluticasone-Salmeterol (ADVAIR) 250-50 MCG/DOSE AEPB Inhale 1 puff into the lungs 2 (two) times daily.   Yes [provider]  furosemide (LASIX) 20 MG tablet Take 2 tablets (40 mg total) by mouth daily for 3 days, THEN 1 tablet (20 mg total) as needed. Patient taking differently: 20mg  daily as needed for fluid/swelling 01/11/21 01/14/22 Yes Clegg, Amy D, NP  latanoprost (XALATAN) 0.005 % ophthalmic solution Place 1 drop into both eyes at bedtime. 07/26/17  Yes [provider]  metoprolol succinate (TOPROL-XL) 25 MG 24 hr tablet Take 0.5 tablets (12.5 mg total) by mouth daily. 12/31/20  Yes Caren Griffins, MD  Multiple Vitamin (MULTIVITAMIN WITH MINERALS) TABS tablet Take 1 tablet by mouth every morning.   Yes [provider]  Omega-3 Fatty Acids (FISH OIL) 1000 MG CAPS Take 1,000 mg by mouth daily.   Yes [provider]  oseltamivir (TAMIFLU) 75 MG capsule Take 75 mg by mouth 2 (two) times daily.   Yes [provider]  pantoprazole (PROTONIX) 20 MG tablet Take 1 tablet (20 mg total) by mouth daily. 04/01/18  Yes Garvin Fila, MD  promethazine-dextromethorphan (PROMETHAZINE-DM) 6.25-15 MG/5ML syrup Take 5 mLs by mouth See admin instructions. Every 4 to 6 hours as needed for cough   Yes [provider]    Past Medical History: Past Medical History:  Diagnosis Date   Anemia    none recent   Arthritis    Asthma    Chronic kidney  disease    ckd stage 3   Family history of adverse reaction to anesthesia    daughter has ponv    Glaucoma    Hypertension     Past Surgical History: Past Surgical History:  Procedure Laterality Date   CARDIOVERSION N/A 12/30/2020   Procedure: CARDIOVERSION;  Surgeon: Jerline Pain, MD;  Location[facility] ENDOSCOPY;  Service: Cardiovascular;  Laterality: N/A;   CARDIOVERSION N/A 01/14/2021   Procedure: CARDIOVERSION;  Surgeon: Jolaine Artist, MD;  Location: [facility];  Service: Cardiovascular;  Laterality: N/A;   COLONOSCOPY WITH PROPOFOL N/A 02/03/2015   Procedure: COLONOSCOPY WITH PROPOFOL;  Surgeon:  Arta Silence, MD;  Location: [facility];  Service: Endoscopy;  Laterality: N/A;   KNEE ARTHROSCOPY Left    ROTATOR CUFF REPAIR Right    SHOULDER ARTHROSCOPY Left     Family History: Family History  Problem Relation Age of Onset   Hypertension Mother    Hypertension Father     Social History: Social History   Socioeconomic History   Marital status: Widowed    Spouse name: Not on file   Number of children: 5   Years of education: 8   Highest education level: Not on file  Occupational History   Occupation: Retired   Tobacco Use   Smoking status: Former    Packs/day: 1.00    Years: 30.00    Pack years: 30.00    Types: Cigarettes   Smokeless tobacco: Never   Tobacco comments:    quit 35 yrs ago  Substance and Sexual Activity   Alcohol use: No   Drug use: No   Sexual activity: Not on file  Other Topics Concern   Not on file  Social History Narrative   11/28/18 Lives with grandson, [name].    Caffeine use: daily   Right handed    Social Determinants of Health   Financial Resource Strain: Low Risk    Difficulty of Paying Living Expenses: Not very hard  Food Insecurity: No Food Insecurity   Worried About Charity fundraiser in the Last Year: Never true   Ran Out of Food in the Last Year: Never true  Transportation Needs: No Transportation Needs   Lack of  Transportation (Medical): No   Lack of Transportation (Non-Medical): No  Physical Activity: Not on file  Stress: Not on file  Social Connections: Not on file    Allergies:  Allergies  Allergen Reactions   Penicillins Hives    Childhood allergy Has patient had a PCN reaction causing immediate rash, facial/tongue/throat swelling, SOB or lightheadedness with hypotension: Yes Has patient had a PCN reaction causing severe rash involving mucus membranes or skin necrosis: Yes Has patient had a PCN reaction that required hospitalization: Yes Has patient had a PCN reaction occurring within the last 10 years: No If all of the above answers are "NO", then may proceed with Cephalosporin use.     Objective:    Vital Signs:   Temp:  [97.6 F (36.4 C)-98 F (36.7 C)] 97.6 F (36.4 C) (01/13 1155) Pulse Rate:  [84-95] 90 (01/13 1155) Resp:  [15-20] 19 (01/13 1155) BP: (108-122)/(63-77) 108/63 (01/13 1155) SpO2:  [91 %-96 %] 91 % (01/13 1155) FiO2 (%):  [32 %] 32 % (01/12 1954) Last BM Date: 01/19/21  Weight change: Filed Weights   01/18/21 2330  Weight: 85 kg    Intake/Output:   Intake/Output Summary (Last 24 hours) at 01/21/2021 1246 Last data filed at 01/21/2021 0923 Gross per 24 hour  Intake 640 ml  Output 1850 ml  Net -1210 ml      Physical Exam    General: No resp difficulty HEENT: normal Neck: supple. JVP 5-6 . Carotids 2+ bilat; no bruits. No lymphadenopathy or thyromegaly appreciated. Cor: PMI nondisplaced. Irregular rate & rhythm. No rubs, gallops or murmurs. Lungs: RLL wheeze on 2 liters [place] Abdomen: soft, nontender, nondistended. No hepatosplenomegaly. No bruits or masses. Good bowel sounds. Extremities: no cyanosis, clubbing, rash, edema Neuro: alert & orientedx3, cranial nerves grossly intact. moves all 4 extremities w/o difficulty. Affect pleasant   Telemetry   A Fib 100s   EKG  1/13/222 A fib 97 bpm   Labs   Basic Metabolic Panel: Recent Labs   Lab 01/18/21 1655 01/18/21 1938 01/19/21 0543 01/20/21 0104 01/21/21 0127  NA 137 137 136 135 133*  K 5.0 4.9 4.5 4.2 4.0  CL 104  --  103 100 101  CO2 22  --  23 23 24   GLUCOSE 184*  --  128* 107* 174*  BUN 37*  --  34* 46* 54*  CREATININE 2.35*  --  2.36* 2.35* 2.41*  CALCIUM 9.0  --  8.7* 8.7* 8.3*    Liver Function Tests: Recent Labs  Lab 01/18/21 1655  AST 19  ALT 15  ALKPHOS 88  BILITOT 0.5  PROT 6.4*  ALBUMIN 3.5   No results for input(s): LIPASE, AMYLASE in the last 168 hours. No results for input(s): AMMONIA in the last 168 hours.  CBC: Recent Labs  Lab 01/18/21 1655 01/18/21 1938 01/20/21 0104 01/21/21 0127  WBC 11.5*  --  14.1* 12.4*  NEUTROABS  --   --  10.5* 8.5*  HGB 10.8* 12.6* 11.1* 11.3*  HCT 35.5* 37.0* 35.8* 36.3*  MCV 72.6*  --  72.3* 71.7*  PLT 471*  --  495* 486*    Cardiac Enzymes: No results for input(s): CKTOTAL, CKMB, CKMBINDEX, TROPONINI in the last 168 hours.  BNP: BNP (last 3 results) Recent Labs    12/24/20 0941 12/26/20 0404 01/18/21 1655  BNP 1,084.1* 1,213.1* 2,417.5*    ProBNP (last 3 results) Recent Labs    10/15/20 0841 10/27/20 0936 12/15/20 1127  PROBNP 5,768* 6,081* 18,617*     CBG: No results for input(s): GLUCAP in the last 168 hours.  Coagulation Studies: No results for input(s): LABPROT, INR in the last 72 hours.   Imaging   ECHOCARDIOGRAM COMPLETE  Result Date: 01/20/2021    ECHOCARDIOGRAM REPORT   Patient Name:   Nicholas [name] Date of Exam: 01/20/2021 Medical Rec #:  [id number]      Height:       69.0 in Accession #:    [id number]     Weight:       187.4 lb Date of Birth:  August 04, 1933      BSA:          2.010 m Patient Age:    86 years       BP:           119/82 mmHg Patient Gender: M              HR:           89 bpm. Exam Location:  Inpatient Procedure: 2D Echo, Cardiac Doppler, Color Doppler and Intracardiac            Opacification Agent Indications:    Dyspnea  History:        Patient has  prior history of Echocardiogram examinations. CHF,                 TIA and COPD, Arrythmias:Atrial Fibrillation; Risk                 Factors:Hypertension.  Sonographer:    Jyl Heinz Referring Phys: Theola Sequin IMPRESSIONS  1. Left ventricular ejection fraction, by estimation, is 25 to 30%. The left ventricle has severely decreased function. The left ventricle demonstrates global hypokinesis. The left ventricular internal cavity size was mildly dilated. Left ventricular diastolic parameters are consistent with Grade I diastolic dysfunction (impaired relaxation).  2. Right ventricular systolic function is  mildly reduced. The right ventricular size is normal. Tricuspid regurgitation signal is inadequate for assessing PA pressure.  3. Left atrial size was moderately dilated.  4. The mitral valve is degenerative. Mild mitral valve regurgitation. No evidence of mitral stenosis.  5. The aortic valve is tricuspid. Aortic valve regurgitation is mild. Mild to moderate aortic valve stenosis. Aortic valve area, by VTI measures 1.68 cm. Aortic valve mean gradient measures 15.0 mmHg.  6. The inferior vena cava is normal in size with greater than 50% respiratory variability, suggesting right atrial pressure of 3 mmHg.  7. Moderate pericardial effusion, primarily adjacent to the RA and RV (small elsewhere). There is no evidence of cardiac tamponade. FINDINGS  Left Ventricle: Left ventricular ejection fraction, by estimation, is 25 to 30%. The left ventricle has severely decreased function. The left ventricle demonstrates global hypokinesis. The left ventricular internal cavity size was mildly dilated. There is no left ventricular hypertrophy. Left ventricular diastolic parameters are consistent with Grade I diastolic dysfunction (impaired relaxation). Right Ventricle: The right ventricular size is normal. No increase in right ventricular wall thickness. Right ventricular systolic function is mildly reduced. Tricuspid  regurgitation signal is inadequate for assessing PA pressure. Left Atrium: Left atrial size was moderately dilated. Right Atrium: Right atrial size was normal in size. Pericardium: A moderately sized pericardial effusion is present. There is no evidence of cardiac tamponade. Mitral Valve: The mitral valve is degenerative in appearance. There is mild calcification of the mitral valve leaflet(s). Mild to moderate mitral annular calcification. Mild mitral valve regurgitation. No evidence of mitral valve stenosis. Tricuspid Valve: The tricuspid valve is normal in structure. Tricuspid valve regurgitation is not demonstrated. Aortic Valve: The aortic valve is tricuspid. Aortic valve regurgitation is mild. Mild to moderate aortic stenosis is present. Aortic valve mean gradient measures 15.0 mmHg. Aortic valve peak gradient measures 20.2 mmHg. Aortic valve area, by VTI measures  1.68 cm. Pulmonic Valve: The pulmonic valve was normal in structure. Pulmonic valve regurgitation is not visualized. Aorta: The aortic root is normal in size and structure. Venous: The inferior vena cava is normal in size with greater than 50% respiratory variability, suggesting right atrial pressure of 3 mmHg. IAS/Shunts: No atrial level shunt detected by color flow Doppler.  LEFT VENTRICLE PLAX 2D LVIDd:         6.20 cm      Diastology LVIDs:         5.60 cm      LV e' medial:    6.64 cm/s LV PW:         0.90 cm      LV E/e' medial:  15.2 LV IVS:        1.00 cm      LV e' lateral:   3.59 cm/s LVOT diam:     2.30 cm      LV E/e' lateral: 28.1 LV SV:         82 LV SV Index:   41 LVOT Area:     4.15 cm  LV Volumes (MOD) LV vol d, MOD A2C: 240.0 ml LV vol d, MOD A4C: 221.0 ml LV vol s, MOD A2C: 161.0 ml LV vol s, MOD A4C: 150.0 ml LV SV MOD A2C:     79.0 ml LV SV MOD A4C:     221.0 ml LV SV MOD BP:      75.5 ml RIGHT VENTRICLE            IVC RV Basal diam:  3.60  cm    IVC diam: 1.80 cm RV Mid diam:    2.50 cm RV S prime:     7.62 cm/s TAPSE  (M-mode): 1.8 cm LEFT ATRIUM              Index        RIGHT ATRIUM           Index LA diam:        4.70 cm  2.34 cm/m   RA Area:     17.00 cm LA Vol (A2C):   97.1 ml  48.32 ml/m  RA Volume:   42.60 ml  21.20 ml/m LA Vol (A4C):   110.0 ml 54.74 ml/m LA Biplane Vol: 108.0 ml 53.74 ml/m  AORTIC VALVE AV Area (Vmax):    1.75 cm AV Area (Vmean):   1.65 cm AV Area (VTI):     1.68 cm AV Vmax:           225.00 cm/s AV Vmean:          176.333 cm/s AV VTI:            0.490 m AV Peak Grad:      20.2 mmHg AV Mean Grad:      15.0 mmHg LVOT Vmax:         94.95 cm/s LVOT Vmean:        69.850 cm/s LVOT VTI:          0.198 m LVOT/AV VTI ratio: 0.41  AORTA Ao Root diam: 3.20 cm Ao Asc diam:  3.60 cm MITRAL VALVE MV Area (PHT): 6.17 cm     SHUNTS MV Decel Time: 123 msec     Systemic VTI:  0.20 m Nicholas Peak grad: 91.0 mmHg     Systemic Diam: 2.30 cm Nicholas Mean grad: 58.0 mmHg Nicholas Vmax:      477.00 cm/s Nicholas Vmean:     369.0 cm/s MV E velocity: 101.00 cm/s MV A velocity: 107.00 cm/s MV E/A ratio:  0.94 Dalton McleanMD Electronically signed by Franki Monte Signature Date/Time: 01/20/2021/7:49:59 PM    Final      Medications:     Current Medications:  allopurinol  100 mg Oral Daily   amiodarone  400 mg Oral BID   amLODipine  5 mg Oral Daily   apixaban  2.5 mg Oral BID   dextromethorphan-guaiFENesin  1 tablet Oral BID   feeding supplement  237 mL Oral BID BM   furosemide  60 mg Intravenous Daily   latanoprost  1 drop Both Eyes QHS   methylPREDNISolone (SOLU-MEDROL) injection  60 mg Intravenous Daily   metoprolol succinate  12.5 mg Oral Daily   mometasone-formoterol  2 puff Inhalation BID   multivitamin with minerals  1 tablet Oral q morning   pantoprazole  20 mg Oral Daily   umeclidinium bromide  1 puff Inhalation Daily    Infusions:     Patient Profile    Nicholas [name] is a 88 year old with history of CKD Stage IIIB, A flutter, HFmEF, HTN, TIA, IDA, and gout. He has not had an ischemic work up.    Admitted  with acute hypoxic respiratory failure, AECOPD + A/C HF.  Assessment/Plan   A/C HFmEF -NICM suspect tachy-mediated but he has not had an ischemic work up. Could consider myoview.  - Echo 12/2020 EF 40-45% which is down from previous 55-60%. - Diuresed with IV lasix. Volume status appears stable. Restart lasix 40 mg daily.   --BB-Continue Toprol XL  12.5 mg daily  -Ace/ARB/ARNI- no elevated creatinine -MRA-- no elevated creatinine  -SGLT2i- Could consider jardiance but with CKD Stage IV hold off.    2. PAF -S/P DC-CV 12/2020 --> NSR - Went back in A fib and had another DC_CV 01/15/20--> SR - Today he went back in A fib after receiving duoneb which was later stopped.   - Continue amio 400 mg twice a day + Toprol XL 12.5 mg daily. - Continue eliquis 2.5 mg twice a day.  ? Watchman would be a consideration.  - Will need to consider another cardioversion. Would like for him to recover from this exacerbation. From HF perspective he appears stable. Hopefully he will spontaneously convert.    3. Acute Hypoxic Respiratory Failure in the setting of AECOPD - Given steroids, doxycycline + tamiflu at Urgent Care on 1/9  - On admit started on steroid taper and bronchodilators with improvement.  -Now down to 2 liters with stable O2 sats. He is not on oxygen at home.   4. HTN  Stable.    5. CKD Stage IV -Creatinine baseline 2.4-2.6 -Todays creatinine 2.4  - Followed by Dr Justin Mend   6. IDA -He was seen by Hematology 11/04/20 for anemia.  -Iron Sats 4%. Had Iron transfusions x2 - Hgb stable 11.3  - Recommendations for GI follow up.   7. DNR  -Palliative Care following.  -He was referred to outpatient Palliative   Length of Stay: 3  Amy Clegg, NP  01/21/2021, 12:46 PM  Advanced Heart Failure Team Pager [phone] (M-F; 7a - 5p)  Please contact [facility] for night-coverage after hours (4p -7a ) and weekends on [email or web address]  Patient seen and examined with the above-signed Advanced Practice  Provider and/or Housestaff. I personally reviewed laboratory data, imaging studies and relevant notes. I independently examined the patient and formulated the important aspects of the plan. I have edited the note to reflect any of my changes or salient points. I have personally discussed the plan with the patient and/or family.  Frail 88 y/o male with CKD IV, COPD, admitted 12/22 with PAF. EF dropped 55%-. 40-45%. Underwent DC-CV. AF returned. Underwent repeat DC-CV earlier this week.   Developed SOB and wheezing. Went to UC and tested + for flu B. Breathing worse. Came to ER. Covid and flu negative. Treated for COPD flare and also got lasix. Went back into AF.   General:  Elderly frail. Minimal wheeze HEENT: normal Neck: supple. JVP 8-9 Carotids 2+ bilat; no bruits. No lymphadenopathy or thryomegaly appreciated. Cor: PMI nondisplaced. Irregular rate & rhythm. No rubs, gallops or murmurs. Lungs: + wheeze  Abdomen: soft, nontender, nondistended. No hepatosplenomegaly. No bruits or masses. Good bowel sounds. Extremities: no cyanosis, clubbing, rash, edema Neuro: alert & orientedx3, cranial nerves grossly intact. moves all 4 extremities w/o difficulty. Affect pleasant  He is back in AF in the setting of URI (possible flu). Agree with treating for COPD flare. Volume status looks better after diuresis but still elevated. Continue diuresis. Probably worth one more attempt at [facility].Continue amio and Eliquis.   Has not had ischemic w/u but with SCr 2.5 not cath candidate so would continue medical management.   Palliative Care now involved.   Glori Bickers, MD  5:29 PM

## 2021-01-21 NOTE — Care Management Important Message (Signed)
Important Message  Patient Details  Name: Nicholas [name] MRN: [id number] Date of Birth: October 08, 1933   Medicare Important Message Given:  Yes     [name] 01/21/2021, 1:36 PM

## 2021-01-21 NOTE — Progress Notes (Addendum)
Mobility Specialist: Progress Note   01/21/21 1706  Mobility  Activity  (Cancel)   Instructed to hold off on mobility per RN d/t pt converting to Afib this AM and not wanting to elevated his HR. Will f/u as able.   [facility] Madeleine Fenn Mobility Specialist Mobility Specialist [address] [place] Housing: [phone] Mobility Specialist [address] [place]: [phone]

## 2021-01-21 NOTE — Progress Notes (Signed)
Occupational Therapy Treatment Patient Details Name: Nicholas [name] MRN: [id number] DOB: 1933/04/02 Today's Date: 01/21/2021   History of present illness Pt is an 88 y.o. male admitted 01/18/21 with SOB, wheezing; pt reports testing (+) influenza B on 1/9, but tested (-) on admission. Workup for COPD exacerbation, CHF, potential influenza B. PMH includes CHF, PAF, CKD; of note, recent admission 12/24/20 s/p cardioversion for afib.   OT comments  Pt progressed well during this session with daughter present the entire session. Pt with less wheezing sounds to breathing and sustained RA 91% during functional adl task. Pt and family educated and provided energy conservation handout. Recommendation for seated showering at home initially due to fall risk.    Recommendations for follow up therapy are one component of a multi-disciplinary discharge planning process, led by the attending physician.  Recommendations may be updated based on patient status, additional functional criteria and insurance authorization.    Follow Up Recommendations  Home health OT    Assistance Recommended at Discharge Intermittent Supervision/Assistance  Patient can return home with the following  Assist for transportation;Direct supervision/assist for financial management;Direct supervision/assist for medications management;Assistance with cooking/housework;A little help with walking and/or transfers;A little help with bathing/dressing/bathroom   Equipment Recommendations  None recommended by OT    Recommendations for Other Services      Precautions / Restrictions Precautions Precautions: Fall;Other (comment) Precaution Comments: Watch RR/SpO2 (does not wear O2 baseline) - significant wheezing       Mobility Bed Mobility Overal bed mobility: Modified Independent                  Transfers Overall transfer level: Needs assistance   Transfers: Sit to/from Stand Sit to Stand: Supervision                  Balance Overall balance assessment: Needs assistance                           High level balance activites: Turns;Side stepping High Level Balance Comments: pt provided higher balance task such as vision occlusion to simulate shower with sway noted. Recommending shower seat for balance and first two attempts to be with family present. pt agreeable. Pt noted to have backward stepping when reaching outside base of support to open and close door.           ADL either performed or assessed with clinical judgement   ADL Overall ADL's : Needs assistance/impaired     Grooming: Min Dispensing optician: Min guard;Ambulation           Functional mobility during ADLs: Min guard      Extremity/Trunk Assessment              Vision       Perception     Praxis      Cognition Arousal/Alertness: Awake/alert Behavior During Therapy: WFL for tasks assessed/performed Overall Cognitive Status: Within Functional Limits for tasks assessed                                            Exercises     Shoulder Instructions       General Comments 91% RA HR 116 with brisk task back to back to challenge  balanace and vitals during normal morning sequence routine.    Pertinent Vitals/ Pain       Pain Assessment: No/denies pain  Home Living                                          Prior Functioning/Environment              Frequency  Min 2X/week        Progress Toward Goals  OT Goals(current goals can now be found in the care plan section)  Progress towards OT goals: Progressing toward goals  Acute Rehab OT Goals Patient Stated Goal: to return home OT Goal Formulation: With patient Time For Goal Achievement: 02/02/21 Potential to Achieve Goals: Good ADL Goals Additional ADL Goal #1: pt will complete 2 energy conservation strategies with adls Additional ADL Goal #2: Pt will  complete line management for O2 mod I for adls.  Plan Discharge plan remains appropriate    Co-evaluation                 AM-PAC OT "6 Clicks" Daily Activity     Outcome Measure   Help from another person eating meals?: None Help from another person taking care of personal grooming?: A Little Help from another person toileting, which includes using toliet, bedpan, or urinal?: A Little Help from another person bathing (including washing, rinsing, drying)?: None Help from another person to put on and taking off regular upper body clothing?: A Little Help from another person to put on and taking off regular lower body clothing?: A Little 6 Click Score: 20    End of Session    OT Visit Diagnosis: Unsteadiness on feet (R26.81);Muscle weakness (generalized) (M62.81)   Activity Tolerance Patient tolerated treatment well   Patient Left in bed;with call bell/phone within reach;with family/visitor present   Nurse Communication Mobility status;Precautions        Time: 1044 (1044)-1105 OT Time Calculation (min): 21 min  Charges: OT General Charges $OT Visit: 1 Visit OT Treatments $Self Care/Home Management : 8-22 mins   Brynn, OTR/L  Acute Rehabilitation Services Pager: [phone] Office: [phone] .   Jeri Modena 01/21/2021, 1:15 PM

## 2021-01-21 NOTE — Progress Notes (Signed)
° °  We have received the referral for [facility] which is a home based Palliative Care program provided by [facility]. I have been able to connect with the daughter [name] and she reports they are interested in the services when he is d/c home.  He can have other services such as PT/OT/speech with our services in place as well.   He will be receiving nursing visits 1-3 times a month which has nursing support after hours as well and SW visits 1 time a month.   We will be reaching out to the community MD to obtain an order for Palliative care services after d/c.   Thank you for this referral.  Webb Silversmith RN [phone]

## 2021-01-21 NOTE — TOC Progression Note (Addendum)
Transition of Care [facility]) - Progression Note    Patient Details  Name: Nicholas [name] MRN: [id number] Date of Birth: 08/15/1933  Transition of Care [facility] CM/SW [place], RN Phone [phone]  01/21/2021, 10:08 AM  Clinical Narrative:    [facility] consulted for outpatient palliative services. CM spoke with HCPOA daughter Durenda Age to offer choice. Daughter has requested referral be sent to [facility]. CM called referral to [place] with [facility]. Referral has been accepted and the office will reach out to the family.    Expected Discharge Plan: Home/Self Care (home with Palliatine following) Barriers to Discharge: Continued Medical Work up  Expected Discharge Plan and Services Expected Discharge Plan: Home/Self Care (home with Palliatine following) In-house Referral: NA Discharge Planning Services: CM Consult Post Acute Care Choice:  (Palliative Services with [facility]) Living arrangements for the past 2 months: Single Family Home                 DME Arranged: N/A DME Agency: NA       HH Arranged: NA HH Agency: NA         Social Determinants of Health (SDOH) Interventions    Readmission Risk Interventions Readmission Risk Prevention Plan 01/21/2021  Transportation Screening Complete  PCP or Specialist Appt within 3-5 Days Not Complete  HRI or [place] Complete  Social Work Consult for [place] Planning/Counseling Complete  Palliative Care Screening Complete  Medication Review Press photographer) Referral to Pharmacy  Some recent data might be hidden

## 2021-01-21 NOTE — Progress Notes (Signed)
PROGRESS NOTE    Nicholas [name]  OLM:[id number] DOB: 1933/11/13 DOA: 01/18/2021 PCP: [facility], [name], MD  Chief Complaint  Patient presents with   Shortness of Breath    Brief Narrative:   Nicholas [name] is a 88 y.o. male with medical history significant of chronic combined CHF, PAF, CKD 3b s/p cardioversion for A.Fib at hospital x5 days ago. Since discharge pt developed wheezing, SOB, over the past week.  Symptoms persistent and worsening. Went to [facility] 1/9, tested positive for influenza B per report .Got steroids, doxycycline, and antiviral Rx. Pt presents with worsening sob. He was admitted for acute respiratory failure with hypoxia from acute copd exacerbation.   Assessment & Plan:   Principal Problem:   COPD exacerbation ([place]) Active Problems:   HTN (hypertension)   Stage 3b chronic kidney disease (CKD) (HCC)   Chronic combined systolic and diastolic CHF (congestive heart failure) (HCC)   A-fib (HCC)   Influenza B   Acute respiratory failure with hypoxia sec to acute copd exacerbation and acute on chronic combined systolic and diastolic CHF.  Still requiring 2 lit of [place] oxygen, wheezing has improved.  Transition to IV solumedrol today 60 mg daily,  continue with xopenox and avoid albuterol.  Continue with IV lasix 60 mg daily.  Echocardiogram showed LVEF of estimation, is 25 to 30%. The left ventricle has severely decreased function. The left ventricle demonstrates global hypokinesis. The left ventricular internal cavity size was mildly dilated. Left ventricular diastolic parameters are consistent with Grade I diastolic dysfunction (impaired relaxation). This is new compared tot he echo from 10/22.  Family is requesting to be seen by cardiology. Continue with strict intake and output, monitor daily weights and renal parameters while on IV lasix.  Potassium wnl.  Therapy evaluations recommending [place] refusing home health care.     Stage 3 b  CKD: Creatinine stable around 2.3.    Paroxysmal Atrial fibrillation:  S/p cardioversion last week. Pt had an episode of afib after duoneb treatment. Will d/c duoneb.  On eliquis for anticoagulation, and metoprolol for rate control.   Influenza:  Complete the course of tamiflu.   Hypertension:  BP parameters are optimal.   Leukocytosis:  From steroids.    Diarrhea possibly from antibiotics.  Monitor.    Mild microcytic anemia:  Get anemia panel.    Thrombocytosis:  Probably reactive.   DVT prophylaxis: on eliquis.  Code Status: FULL CODE.  Family Communication: daughter at bedside.  Disposition:   Status is: Inpatient  Remains inpatient appropriate because: acute hypoxemic respiratory failure.        Consultants:  Cardiology.   Procedures: Echocardiogram.   Antimicrobials:  Antibiotics Given (last 72 hours)     Date/Time Action Medication Dose   01/19/21 0804 Given   doxycycline (VIBRA-TABS) tablet 100 mg 100 mg   01/19/21 1023 Given   oseltamivir (TAMIFLU) capsule 30 mg 30 mg   01/19/21 1943 Given   doxycycline (VIBRA-TABS) tablet 100 mg 100 mg   01/20/21 0735 Given   doxycycline (VIBRA-TABS) tablet 100 mg 100 mg   01/20/21 1019 Given   oseltamivir (TAMIFLU) capsule 30 mg 30 mg   01/21/21 1011 Given   oseltamivir (TAMIFLU) capsule 30 mg 30 mg         Subjective: Breathing and wheezing has improved.   Objective: Vitals:   01/21/21 0840 01/21/21 1012 01/21/21 1111 01/21/21 1155  BP:  121/77  108/63  Pulse:    90  Resp:    19  Temp:    97.6 F (36.4 C)  TempSrc:    Oral  SpO2: 94%  93% 91%  Weight:      Height:        Intake/Output Summary (Last 24 hours) at 01/21/2021 1359 Last data filed at 01/21/2021 1344 Gross per 24 hour  Intake 640 ml  Output 2350 ml  Net -1710 ml    Filed Weights   01/18/21 2330  Weight: 85 kg    Examination:  General exam: Elderly gentleman, not in distress.  Respiratory system: diminished at  bedside, scattered wheezing posteriorly bilaterally. On 2lit of [place] oxygen Cardiovascular system: S1 & S2 heard, tachycardic, irregular,  No pedal edema. Gastrointestinal system: Abdomen is nondistended, soft and nontender. Normal bowel sounds heard. Central nervous system: Alert and oriented. No focal neurological deficits. Extremities: Symmetric 5 x 5 power. Skin: No rashes, lesions or ulcers Psychiatry: Mood & affect appropriate.      Data Reviewed: I have personally reviewed following labs and imaging studies  CBC: Recent Labs  Lab 01/18/21 1655 01/18/21 1938 01/20/21 0104 01/21/21 0127  WBC 11.5*  --  14.1* 12.4*  NEUTROABS  --   --  10.5* 8.5*  HGB 10.8* 12.6* 11.1* 11.3*  HCT 35.5* 37.0* 35.8* 36.3*  MCV 72.6*  --  72.3* 71.7*  PLT 471*  --  495* 486*     Basic Metabolic Panel: Recent Labs  Lab 01/18/21 1655 01/18/21 1938 01/19/21 0543 01/20/21 0104 01/21/21 0127  NA 137 137 136 135 133*  K 5.0 4.9 4.5 4.2 4.0  CL 104  --  103 100 101  CO2 22  --  23 23 24   GLUCOSE 184*  --  128* 107* 174*  BUN 37*  --  34* 46* 54*  CREATININE 2.35*  --  2.36* 2.35* 2.41*  CALCIUM 9.0  --  8.7* 8.7* 8.3*     GFR: Estimated Creatinine Clearance: 24.2 mL/min (A) (by C-G formula based on SCr of 2.41 mg/dL (H)).  Liver Function Tests: Recent Labs  Lab 01/18/21 1655  AST 19  ALT 15  ALKPHOS 88  BILITOT 0.5  PROT 6.4*  ALBUMIN 3.5     CBG: No results for input(s): GLUCAP in the last 168 hours.   Recent Results (from the past 240 hour(s))  Resp Panel by RT-PCR (Flu A&B, Covid) Nasopharyngeal Swab     Status: None   Collection Time: 01/18/21  5:00 PM   Specimen: Nasopharyngeal Swab; Nasopharyngeal(NP) swabs in vial transport medium  Result Value Ref Range Status   SARS Coronavirus 2 by RT PCR NEGATIVE NEGATIVE Final    Comment: (NOTE) SARS-CoV-2 target nucleic acids are NOT DETECTED.  The SARS-CoV-2 RNA is generally detectable in upper respiratory specimens  during the acute phase of infection. The lowest concentration of SARS-CoV-2 viral copies this assay can detect is 138 copies/mL. A negative result does not preclude SARS-Cov-2 infection and should not be used as the sole basis for treatment or other patient management decisions. A negative result may occur with  improper specimen collection/handling, submission of specimen other than nasopharyngeal swab, presence of viral mutation(s) within the areas targeted by this assay, and inadequate number of viral copies(<138 copies/mL). A negative result must be combined with clinical observations, patient history, and epidemiological information. The expected result is Negative.  Fact Sheet for Patients:  EntrepreneurPulse.com.au  Fact Sheet for Healthcare Providers:  IncredibleEmployment.be  This test is no t yet approved or cleared  by the [place] and  has been authorized for detection and/or diagnosis of SARS-CoV-2 by FDA under an Emergency Use Authorization (EUA). This EUA will remain  in effect (meaning this test can be used) for the duration of the COVID-19 declaration under Section 564(b)(1) of the Act, 21 U.S.C.section 360bbb-3(b)(1), unless the authorization is terminated  or revoked sooner.       Influenza A by PCR NEGATIVE NEGATIVE Final   Influenza B by PCR NEGATIVE NEGATIVE Final    Comment: (NOTE) The Xpert Xpress SARS-CoV-2/FLU/RSV plus assay is intended as an aid in the diagnosis of influenza from Nasopharyngeal swab specimens and should not be used as a sole basis for treatment. Nasal washings and aspirates are unacceptable for Xpert Xpress SARS-CoV-2/FLU/RSV testing.  Fact Sheet for Patients: EntrepreneurPulse.com.au  Fact Sheet for Healthcare Providers: IncredibleEmployment.be  This test is not yet approved or cleared by the [place] FDA and has been authorized for detection  and/or diagnosis of SARS-CoV-2 by FDA under an Emergency Use Authorization (EUA). This EUA will remain in effect (meaning this test can be used) for the duration of the COVID-19 declaration under Section 564(b)(1) of the Act, 21 U.S.C. section 360bbb-3(b)(1), unless the authorization is terminated or revoked.  Performed at [facility], [address] [place], [place] [address]   MRSA Next Gen by PCR, Nasal     Status: None   Collection Time: 01/18/21 11:40 PM   Specimen: Nasal Mucosa; Nasal Swab  Result Value Ref Range Status   MRSA by PCR Next Gen NOT DETECTED NOT DETECTED Final    Comment: (NOTE) The GeneXpert MRSA Assay (FDA approved for NASAL specimens only), is one component of a comprehensive MRSA colonization surveillance program. It is not intended to diagnose MRSA infection nor to guide or monitor treatment for MRSA infections. Test performance is not FDA approved in patients less than 50 years old. Performed at [facility], [place] [address], [place], [place] [address]           Radiology Studies: ECHOCARDIOGRAM COMPLETE  Result Date: 01/20/2021    ECHOCARDIOGRAM REPORT   Patient Name:   BRENDON CHRISTOFFEL Date of Exam: 01/20/2021 Medical Rec #:  [id number]      Height:       69.0 in Accession #:    [id number]     Weight:       187.4 lb Date of Birth:  Nov 30, 1933      BSA:          2.010 m Patient Age:    80 years       BP:           119/82 mmHg Patient Gender: M              HR:           89 bpm. Exam Location:  Inpatient Procedure: 2D Echo, Cardiac Doppler, Color Doppler and Intracardiac            Opacification Agent Indications:    Dyspnea  History:        Patient has prior history of Echocardiogram examinations. CHF,                 TIA and COPD, Arrythmias:Atrial Fibrillation; Risk                 Factors:Hypertension.  Sonographer:    Jyl Heinz Referring Phys: Theola Sequin IMPRESSIONS  1. Left ventricular ejection fraction, by estimation,  is 25 to 30%. The left ventricle has severely decreased function. The left ventricle demonstrates global hypokinesis. The left ventricular internal cavity size was mildly dilated. Left ventricular diastolic parameters are consistent with Grade I diastolic dysfunction (impaired relaxation).  2. Right ventricular systolic function is mildly reduced. The right ventricular size is normal. Tricuspid regurgitation signal is inadequate for assessing PA pressure.  3. Left atrial size was moderately dilated.  4. The mitral valve is degenerative. Mild mitral valve regurgitation. No evidence of mitral stenosis.  5. The aortic valve is tricuspid. Aortic valve regurgitation is mild. Mild to moderate aortic valve stenosis. Aortic valve area, by VTI measures 1.68 cm. Aortic valve mean gradient measures 15.0 mmHg.  6. The inferior vena cava is normal in size with greater than 50% respiratory variability, suggesting right atrial pressure of 3 mmHg.  7. Moderate pericardial effusion, primarily adjacent to the RA and RV (small elsewhere). There is no evidence of cardiac tamponade. FINDINGS  Left Ventricle: Left ventricular ejection fraction, by estimation, is 25 to 30%. The left ventricle has severely decreased function. The left ventricle demonstrates global hypokinesis. The left ventricular internal cavity size was mildly dilated. There is no left ventricular hypertrophy. Left ventricular diastolic parameters are consistent with Grade I diastolic dysfunction (impaired relaxation). Right Ventricle: The right ventricular size is normal. No increase in right ventricular wall thickness. Right ventricular systolic function is mildly reduced. Tricuspid regurgitation signal is inadequate for assessing PA pressure. Left Atrium: Left atrial size was moderately dilated. Right Atrium: Right atrial size was normal in size. Pericardium: A moderately sized pericardial effusion is present. There is no evidence of cardiac tamponade. Mitral Valve:  The mitral valve is degenerative in appearance. There is mild calcification of the mitral valve leaflet(s). Mild to moderate mitral annular calcification. Mild mitral valve regurgitation. No evidence of mitral valve stenosis. Tricuspid Valve: The tricuspid valve is normal in structure. Tricuspid valve regurgitation is not demonstrated. Aortic Valve: The aortic valve is tricuspid. Aortic valve regurgitation is mild. Mild to moderate aortic stenosis is present. Aortic valve mean gradient measures 15.0 mmHg. Aortic valve peak gradient measures 20.2 mmHg. Aortic valve area, by VTI measures  1.68 cm. Pulmonic Valve: The pulmonic valve was normal in structure. Pulmonic valve regurgitation is not visualized. Aorta: The aortic root is normal in size and structure. Venous: The inferior vena cava is normal in size with greater than 50% respiratory variability, suggesting right atrial pressure of 3 mmHg. IAS/Shunts: No atrial level shunt detected by color flow Doppler.  LEFT VENTRICLE PLAX 2D LVIDd:         6.20 cm      Diastology LVIDs:         5.60 cm      LV e' medial:    6.64 cm/s LV PW:         0.90 cm      LV E/e' medial:  15.2 LV IVS:        1.00 cm      LV e' lateral:   3.59 cm/s LVOT diam:     2.30 cm      LV E/e' lateral: 28.1 LV SV:         82 LV SV Index:   41 LVOT Area:     4.15 cm  LV Volumes (MOD) LV vol d, MOD A2C: 240.0 ml LV vol d, MOD A4C: 221.0 ml LV vol s, MOD A2C: 161.0 ml LV vol s, MOD A4C: 150.0 ml LV SV MOD A2C:  79.0 ml LV SV MOD A4C:     221.0 ml LV SV MOD BP:      75.5 ml RIGHT VENTRICLE            IVC RV Basal diam:  3.60 cm    IVC diam: 1.80 cm RV Mid diam:    2.50 cm RV S prime:     7.62 cm/s TAPSE (M-mode): 1.8 cm LEFT ATRIUM              Index        RIGHT ATRIUM           Index LA diam:        4.70 cm  2.34 cm/m   RA Area:     17.00 cm LA Vol (A2C):   97.1 ml  48.32 ml/m  RA Volume:   42.60 ml  21.20 ml/m LA Vol (A4C):   110.0 ml 54.74 ml/m LA Biplane Vol: 108.0 ml 53.74 ml/m   AORTIC VALVE AV Area (Vmax):    1.75 cm AV Area (Vmean):   1.65 cm AV Area (VTI):     1.68 cm AV Vmax:           225.00 cm/s AV Vmean:          176.333 cm/s AV VTI:            0.490 m AV Peak Grad:      20.2 mmHg AV Mean Grad:      15.0 mmHg LVOT Vmax:         94.95 cm/s LVOT Vmean:        69.850 cm/s LVOT VTI:          0.198 m LVOT/AV VTI ratio: 0.41  AORTA Ao Root diam: 3.20 cm Ao Asc diam:  3.60 cm MITRAL VALVE MV Area (PHT): 6.17 cm     SHUNTS MV Decel Time: 123 msec     Systemic VTI:  0.20 m MR Peak grad: 91.0 mmHg     Systemic Diam: 2.30 cm MR Mean grad: 58.0 mmHg MR Vmax:      477.00 cm/s MR Vmean:     369.0 cm/s MV E velocity: 101.00 cm/s MV A velocity: 107.00 cm/s MV E/A ratio:  0.94 Dalton McleanMD Electronically signed by Franki Monte Signature Date/Time: 01/20/2021/7:49:59 PM    Final         Scheduled Meds:  allopurinol  100 mg Oral Daily   amiodarone  400 mg Oral BID   amLODipine  5 mg Oral Daily   apixaban  2.5 mg Oral BID   dextromethorphan-guaiFENesin  1 tablet Oral BID   feeding supplement  237 mL Oral BID BM   furosemide  60 mg Intravenous Daily   latanoprost  1 drop Both Eyes QHS   methylPREDNISolone (SOLU-MEDROL) injection  60 mg Intravenous Daily   metoprolol succinate  12.5 mg Oral Daily   mometasone-formoterol  2 puff Inhalation BID   multivitamin with minerals  1 tablet Oral q morning   pantoprazole  20 mg Oral Daily   umeclidinium bromide  1 puff Inhalation Daily   Continuous Infusions:   LOS: 3 days       Hosie Poisson, MD [facility]   To contact the attending provider between 7A-7P or the covering provider during after hours 7P-7A, please log into the web site [email or web address] and access using universal [place] password for that web site. If you do not have the password, please call the hospital operator.  01/21/2021, 1:59 PM

## 2021-01-22 LAB — CBC WITH DIFFERENTIAL/PLATELET
Abs Immature Granulocytes: 0 10*3/uL (ref 0.00–0.07)
Basophils Absolute: 0 10*3/uL (ref 0.0–0.1)
Basophils Relative: 0 %
Eosinophils Absolute: 0 10*3/uL (ref 0.0–0.5)
Eosinophils Relative: 0 %
HCT: 37.4 % — ABNORMAL LOW (ref 39.0–52.0)
Hemoglobin: 11.8 g/dL — ABNORMAL LOW (ref 13.0–17.0)
Lymphocytes Relative: 25 %
Lymphs Abs: 4 10*3/uL (ref 0.7–4.0)
MCH: 22.6 pg — ABNORMAL LOW (ref 26.0–34.0)
MCHC: 31.6 g/dL (ref 30.0–36.0)
MCV: 71.5 fL — ABNORMAL LOW (ref 80.0–100.0)
Monocytes Absolute: 0.8 10*3/uL (ref 0.1–1.0)
Monocytes Relative: 5 %
Neutro Abs: 11.1 10*3/uL — ABNORMAL HIGH (ref 1.7–7.7)
Neutrophils Relative %: 70 %
Platelets: 555 10*3/uL — ABNORMAL HIGH (ref 150–400)
RBC: 5.23 MIL/uL (ref 4.22–5.81)
RDW: 20.7 % — ABNORMAL HIGH (ref 11.5–15.5)
WBC: 15.9 10*3/uL — ABNORMAL HIGH (ref 4.0–10.5)
nRBC: 0 % (ref 0.0–0.2)
nRBC: 0 /100 WBC

## 2021-01-22 LAB — BASIC METABOLIC PANEL
Anion gap: 11 (ref 5–15)
BUN: 67 mg/dL — ABNORMAL HIGH (ref 8–23)
CO2: 27 mmol/L (ref 22–32)
Calcium: 8.7 mg/dL — ABNORMAL LOW (ref 8.9–10.3)
Chloride: 96 mmol/L — ABNORMAL LOW (ref 98–111)
Creatinine, Ser: 2.22 mg/dL — ABNORMAL HIGH (ref 0.61–1.24)
GFR, Estimated: 28 mL/min — ABNORMAL LOW (ref >60)
Glucose, Bld: 126 mg/dL — ABNORMAL HIGH (ref 70–99)
Potassium: 4.2 mmol/L (ref 3.5–5.1)
Sodium: 134 mmol/L — ABNORMAL LOW (ref 135–145)

## 2021-01-22 MED ORDER — IPRATROPIUM-ALBUTEROL 0.5-2.5 (3) MG/3ML IN SOLN
3.0000 mL | RESPIRATORY_TRACT | Status: DC | PRN
  Administered 2021-01-22 – 2021-01-24 (×2): 3 mL via RESPIRATORY_TRACT
  Filled 2021-01-22 (×2): qty 9

## 2021-01-22 NOTE — Progress Notes (Signed)
PROGRESS NOTE    PANAYIOTIS RAINVILLE  HDQ:[id number] DOB: 08-06-1933 DOA: 01/18/2021 PCP: Street, [name], MD  Chief Complaint  Patient presents with   Shortness of Breath    Brief Narrative:   NAITHEN RIVENBURG is a 88 y.o. male with medical history significant of chronic combined CHF, PAF, CKD 3b s/p cardioversion for A.Fib at hospital x5 days ago. Since discharge pt developed wheezing, SOB, over the past week.  Symptoms persistent and worsening. Went to [facility] yesterday 1/9, tested positive for influenza B per report .Got steroids, doxycycline, and antiviral Rx. Pt presents with worsening sob. He was admitted for acute respiratory failure with hypoxia from acute copd exacerbation.   Assessment & Plan:   Principal Problem:   COPD exacerbation ([place]) Active Problems:   HTN (hypertension)   Stage 3b chronic kidney disease (CKD) (HCC)   Chronic combined systolic and diastolic CHF (congestive heart failure) (HCC)   A-fib (HCC)   Influenza B   Acute respiratory failure with hypoxia sec to acute copd exacerbation and acute on chronic combined systolic and diastolic CHF.  Still requiring 2 lit of [place] oxygen, wheezing has improved.  Transition to IV solumedrol today 60 mg daily,  continue with duonebs. Not much improvement when compared to yesterday.  Continue with IV lasix 60 mg daily.  Echocardiogram showed LVEF of estimation, is 25 to 30%. The left ventricle has severely decreased function. The left ventricle demonstrates global hypokinesis. The left ventricular internal cavity size was mildly dilated. Left ventricular diastolic parameters are consistent with Grade I diastolic dysfunction (impaired relaxation). This is new compared tot he echo from 10/22.  Family is requesting to be seen by cardiology. Cardiology consulted and recommendations given.  Continue with strict intake and output, monitor daily weights and renal parameters while on IV lasix.  Potassium wnl.  Therapy evaluations  recommending [facility] refusing home health care.     Stage 3 b CKD: Creatinine stable around 2.3.    Mild hyponatremia.    Paroxysmal Atrial fibrillation:  S/p cardioversion last week. Pt had an episode of afib after duoneb treatment.  On eliquis for anticoagulation, and metoprolol for rate control.  Plan for cardioversion if pt 's continues to go into atib.   Influenza:  Completed the course of tamiflu.   Hypertension:  BP parameters are well controlled.   Leukocytosis:  From steroids.    Diarrhea possibly from antibiotics.  Appears to have resolved.    Mild microcytic anemia:  Get anemia panel.    Thrombocytosis:  Probably reactive.   DVT prophylaxis: on eliquis.  Code Status: DNR.  Family Communication: None at bedside.  Disposition:   Status is: Inpatient  Remains inpatient appropriate because: acute hypoxemic respiratory failure.        Consultants:  Cardiology.   Procedures: Echocardiogram.   Antimicrobials:  Antibiotics Given (last 72 hours)     Date/Time Action Medication Dose   01/19/21 1943 Given   doxycycline (VIBRA-TABS) tablet 100 mg 100 mg   01/20/21 0735 Given   doxycycline (VIBRA-TABS) tablet 100 mg 100 mg   01/20/21 1019 Given   oseltamivir (TAMIFLU) capsule 30 mg 30 mg   01/21/21 1011 Given   oseltamivir (TAMIFLU) capsule 30 mg 30 mg         Subjective: Pt reports wheezing and breathing is the same as yesterday.   Objective: Vitals:   01/22/21 0800 01/22/21 1012 01/22/21 1133 01/22/21 1337  BP:   108/70   Pulse:   Marland Kitchen)  104 97  Resp:   (!) 23 (!) 23  Temp:   98.1 F (36.7 C)   TempSrc:   Oral   SpO2: 92% 94% 92% 93%  Weight:      Height:        Intake/Output Summary (Last 24 hours) at 01/22/2021 1406 Last data filed at 01/22/2021 0800 Gross per 24 hour  Intake 440 ml  Output 1550 ml  Net -1110 ml    Filed Weights   01/18/21 2330 01/21/21 1509  Weight: 85 kg 82.7 kg    Examination:  General  exam: elderly gentleman deconditioned, not in distress.  Respiratory system: bilaterally exp wheezing, diminished air entry , on 2lit of [place] OXYGEN.  Cardiovascular system: S1 & S2 heard, irregularly irregular, No JVD,  No pedal edema. Gastrointestinal system: Abdomen is nondistended, soft and nontender. Normal bowel sounds heard. Central nervous system: Alert and oriented to person and place only.  Extremities: Symmetric 5 x 5 power. Skin: No rashes, lesions or ulcers Psychiatry: Mood & affect appropriate.       Data Reviewed: I have personally reviewed following labs and imaging studies  CBC: Recent Labs  Lab 01/18/21 1655 01/18/21 1938 01/20/21 0104 01/21/21 0127 01/22/21 0041  WBC 11.5*  --  14.1* 12.4* 15.9*  NEUTROABS  --   --  10.5* 8.5* 11.1*  HGB 10.8* 12.6* 11.1* 11.3* 11.8*  HCT 35.5* 37.0* 35.8* 36.3* 37.4*  MCV 72.6*  --  72.3* 71.7* 71.5*  PLT 471*  --  495* 486* 555*     Basic Metabolic Panel: Recent Labs  Lab 01/18/21 1655 01/18/21 1938 01/19/21 0543 01/20/21 0104 01/21/21 0127 01/22/21 0041  NA 137 137 136 135 133* 134*  K 5.0 4.9 4.5 4.2 4.0 4.2  CL 104  --  103 100 101 96*  CO2 22  --  23 23 24 27   GLUCOSE 184*  --  128* 107* 174* 126*  BUN 37*  --  34* 46* 54* 67*  CREATININE 2.35*  --  2.36* 2.35* 2.41* 2.22*  CALCIUM 9.0  --  8.7* 8.7* 8.3* 8.7*     GFR: Estimated Creatinine Clearance: 24.3 mL/min (A) (by C-G formula based on SCr of 2.22 mg/dL (H)).  Liver Function Tests: Recent Labs  Lab 01/18/21 1655  AST 19  ALT 15  ALKPHOS 88  BILITOT 0.5  PROT 6.4*  ALBUMIN 3.5     CBG: No results for input(s): GLUCAP in the last 168 hours.   Recent Results (from the past 240 hour(s))  Resp Panel by RT-PCR (Flu A&B, Covid) Nasopharyngeal Swab     Status: None   Collection Time: 01/18/21  5:00 PM   Specimen: Nasopharyngeal Swab; Nasopharyngeal(NP) swabs in vial transport medium  Result Value Ref Range Status   SARS Coronavirus 2 by  RT PCR NEGATIVE NEGATIVE Final    Comment: (NOTE) SARS-CoV-2 target nucleic acids are NOT DETECTED.  The SARS-CoV-2 RNA is generally detectable in upper respiratory specimens during the acute phase of infection. The lowest concentration of SARS-CoV-2 viral copies this assay can detect is 138 copies/mL. A negative result does not preclude SARS-Cov-2 infection and should not be used as the sole basis for treatment or other patient management decisions. A negative result may occur with  improper specimen collection/handling, submission of specimen other than nasopharyngeal swab, presence of viral mutation(s) within the areas targeted by this assay, and inadequate number of viral copies(<138 copies/mL). A negative result must be combined with clinical observations, patient  history, and epidemiological information. The expected result is Negative.  Fact Sheet for Patients:  EntrepreneurPulse.com.au  Fact Sheet for Healthcare Providers:  IncredibleEmployment.be  This test is no t yet approved or cleared by the [place] FDA and  has been authorized for detection and/or diagnosis of SARS-CoV-2 by FDA under an Emergency Use Authorization (EUA). This EUA will remain  in effect (meaning this test can be used) for the duration of the COVID-19 declaration under Section 564(b)(1) of the Act, 21 U.S.C.section 360bbb-3(b)(1), unless the authorization is terminated  or revoked sooner.       Influenza A by PCR NEGATIVE NEGATIVE Final   Influenza B by PCR NEGATIVE NEGATIVE Final    Comment: (NOTE) The Xpert Xpress SARS-CoV-2/FLU/RSV plus assay is intended as an aid in the diagnosis of influenza from Nasopharyngeal swab specimens and should not be used as a sole basis for treatment. Nasal washings and aspirates are unacceptable for Xpert Xpress SARS-CoV-2/FLU/RSV testing.  Fact Sheet for Patients: EntrepreneurPulse.com.au  Fact Sheet  for Healthcare Providers: IncredibleEmployment.be  This test is not yet approved or cleared by the [place] FDA and has been authorized for detection and/or diagnosis of SARS-CoV-2 by FDA under an Emergency Use Authorization (EUA). This EUA will remain in effect (meaning this test can be used) for the duration of the COVID-19 declaration under Section 564(b)(1) of the Act, 21 U.S.C. section 360bbb-3(b)(1), unless the authorization is terminated or revoked.  Performed at [facility], [address] [place], [place] [address]   MRSA Next Gen by PCR, Nasal     Status: None   Collection Time: 01/18/21 11:40 PM   Specimen: Nasal Mucosa; Nasal Swab  Result Value Ref Range Status   MRSA by PCR Next Gen NOT DETECTED NOT DETECTED Final    Comment: (NOTE) The GeneXpert MRSA Assay (FDA approved for NASAL specimens only), is one component of a comprehensive MRSA colonization surveillance program. It is not intended to diagnose MRSA infection nor to guide or monitor treatment for MRSA infections. Test performance is not FDA approved in patients less than 62 years old. Performed at [facility], [place] [address], [place], [place] [address]           Radiology Studies: ECHOCARDIOGRAM COMPLETE  Result Date: 01/20/2021    ECHOCARDIOGRAM REPORT   Patient Name:   GIULIANO PREECE Date of Exam: 01/20/2021 Medical Rec #:  [id number]      Height:       69.0 in Accession #:    [id number]     Weight:       187.4 lb Date of Birth:  [birth year]      BSA:          2.010 m Patient Age:    69 years       BP:           119/82 mmHg Patient Gender: M              HR:           89 bpm. Exam Location:  Inpatient Procedure: 2D Echo, Cardiac Doppler, Color Doppler and Intracardiac            Opacification Agent Indications:    Dyspnea  History:        Patient has prior history of Echocardiogram examinations. CHF,                 TIA and COPD, Arrythmias:Atrial  Fibrillation; Risk  Factors:Hypertension.  Sonographer:    Jyl Heinz Referring Phys: Theola Sequin IMPRESSIONS  1. Left ventricular ejection fraction, by estimation, is 25 to 30%. The left ventricle has severely decreased function. The left ventricle demonstrates global hypokinesis. The left ventricular internal cavity size was mildly dilated. Left ventricular diastolic parameters are consistent with Grade I diastolic dysfunction (impaired relaxation).  2. Right ventricular systolic function is mildly reduced. The right ventricular size is normal. Tricuspid regurgitation signal is inadequate for assessing PA pressure.  3. Left atrial size was moderately dilated.  4. The mitral valve is degenerative. Mild mitral valve regurgitation. No evidence of mitral stenosis.  5. The aortic valve is tricuspid. Aortic valve regurgitation is mild. Mild to moderate aortic valve stenosis. Aortic valve area, by VTI measures 1.68 cm. Aortic valve mean gradient measures 15.0 mmHg.  6. The inferior vena cava is normal in size with greater than 50% respiratory variability, suggesting right atrial pressure of 3 mmHg.  7. Moderate pericardial effusion, primarily adjacent to the RA and RV (small elsewhere). There is no evidence of cardiac tamponade. FINDINGS  Left Ventricle: Left ventricular ejection fraction, by estimation, is 25 to 30%. The left ventricle has severely decreased function. The left ventricle demonstrates global hypokinesis. The left ventricular internal cavity size was mildly dilated. There is no left ventricular hypertrophy. Left ventricular diastolic parameters are consistent with Grade I diastolic dysfunction (impaired relaxation). Right Ventricle: The right ventricular size is normal. No increase in right ventricular wall thickness. Right ventricular systolic function is mildly reduced. Tricuspid regurgitation signal is inadequate for assessing PA pressure. Left Atrium: Left atrial size was  moderately dilated. Right Atrium: Right atrial size was normal in size. Pericardium: A moderately sized pericardial effusion is present. There is no evidence of cardiac tamponade. Mitral Valve: The mitral valve is degenerative in appearance. There is mild calcification of the mitral valve leaflet(s). Mild to moderate mitral annular calcification. Mild mitral valve regurgitation. No evidence of mitral valve stenosis. Tricuspid Valve: The tricuspid valve is normal in structure. Tricuspid valve regurgitation is not demonstrated. Aortic Valve: The aortic valve is tricuspid. Aortic valve regurgitation is mild. Mild to moderate aortic stenosis is present. Aortic valve mean gradient measures 15.0 mmHg. Aortic valve peak gradient measures 20.2 mmHg. Aortic valve area, by VTI measures  1.68 cm. Pulmonic Valve: The pulmonic valve was normal in structure. Pulmonic valve regurgitation is not visualized. Aorta: The aortic root is normal in size and structure. Venous: The inferior vena cava is normal in size with greater than 50% respiratory variability, suggesting right atrial pressure of 3 mmHg. IAS/Shunts: No atrial level shunt detected by color flow Doppler.  LEFT VENTRICLE PLAX 2D LVIDd:         6.20 cm      Diastology LVIDs:         5.60 cm      LV e' medial:    6.64 cm/s LV PW:         0.90 cm      LV E/e' medial:  15.2 LV IVS:        1.00 cm      LV e' lateral:   3.59 cm/s LVOT diam:     2.30 cm      LV E/e' lateral: 28.1 LV SV:         82 LV SV Index:   41 LVOT Area:     4.15 cm  LV Volumes (MOD) LV vol d, MOD A2C: 240.0 ml LV vol d,  MOD A4C: 221.0 ml LV vol s, MOD A2C: 161.0 ml LV vol s, MOD A4C: 150.0 ml LV SV MOD A2C:     79.0 ml LV SV MOD A4C:     221.0 ml LV SV MOD BP:      75.5 ml RIGHT VENTRICLE            IVC RV Basal diam:  3.60 cm    IVC diam: 1.80 cm RV Mid diam:    2.50 cm RV S prime:     7.62 cm/s TAPSE (M-mode): 1.8 cm LEFT ATRIUM              Index        RIGHT ATRIUM           Index LA diam:        4.70  cm  2.34 cm/m   RA Area:     17.00 cm LA Vol (A2C):   97.1 ml  48.32 ml/m  RA Volume:   42.60 ml  21.20 ml/m LA Vol (A4C):   110.0 ml 54.74 ml/m LA Biplane Vol: 108.0 ml 53.74 ml/m  AORTIC VALVE AV Area (Vmax):    1.75 cm AV Area (Vmean):   1.65 cm AV Area (VTI):     1.68 cm AV Vmax:           225.00 cm/s AV Vmean:          176.333 cm/s AV VTI:            0.490 m AV Peak Grad:      20.2 mmHg AV Mean Grad:      15.0 mmHg LVOT Vmax:         94.95 cm/s LVOT Vmean:        69.850 cm/s LVOT VTI:          0.198 m LVOT/AV VTI ratio: 0.41  AORTA Ao Root diam: 3.20 cm Ao Asc diam:  3.60 cm MITRAL VALVE MV Area (PHT): 6.17 cm     SHUNTS MV Decel Time: 123 msec     Systemic VTI:  0.20 m MR Peak grad: 91.0 mmHg     Systemic Diam: 2.30 cm MR Mean grad: 58.0 mmHg MR Vmax:      477.00 cm/s MR Vmean:     369.0 cm/s MV E velocity: 101.00 cm/s MV A velocity: 107.00 cm/s MV E/A ratio:  0.94 Dalton McleanMD Electronically signed by Franki Monte Signature Date/Time: 01/20/2021/7:49:59 PM    Final         Scheduled Meds:  allopurinol  100 mg Oral Daily   amiodarone  400 mg Oral BID   amLODipine  5 mg Oral Daily   apixaban  2.5 mg Oral BID   dextromethorphan-guaiFENesin  1 tablet Oral BID   feeding supplement  237 mL Oral BID BM   furosemide  60 mg Intravenous BID   latanoprost  1 drop Both Eyes QHS   methylPREDNISolone (SOLU-MEDROL) injection  60 mg Intravenous Daily   metoprolol succinate  12.5 mg Oral Daily   mometasone-formoterol  2 puff Inhalation BID   multivitamin with minerals  1 tablet Oral q morning   pantoprazole  20 mg Oral Daily   umeclidinium bromide  1 puff Inhalation Daily   Continuous Infusions:   LOS: 4 days       Hosie Poisson, MD [facility]   To contact the attending provider between 7A-7P or the covering provider during after hours 7P-7A, please log into the web  site [email or web address] and access using universal [place] password for that web site. If you do not have  the password, please call the hospital operator.  01/22/2021, 2:06 PM

## 2021-01-22 NOTE — Progress Notes (Signed)
Progress Note  Patient Name: Nicholas [name] Date of Encounter: 01/22/2021  Primary Cardiologist: Shirlee More, MD   Subjective   Increased wheezing from prior but improved SOB subjectively.  Patient will be going home with palliative care and is DNR but is NOT comfort care at this time.  Inpatient Medications    Scheduled Meds:  allopurinol  100 mg Oral Daily   amiodarone  400 mg Oral BID   amLODipine  5 mg Oral Daily   apixaban  2.5 mg Oral BID   dextromethorphan-guaiFENesin  1 tablet Oral BID   feeding supplement  237 mL Oral BID BM   furosemide  60 mg Intravenous BID   latanoprost  1 drop Both Eyes QHS   methylPREDNISolone (SOLU-MEDROL) injection  60 mg Intravenous Daily   metoprolol succinate  12.5 mg Oral Daily   mometasone-formoterol  2 puff Inhalation BID   multivitamin with minerals  1 tablet Oral q morning   pantoprazole  20 mg Oral Daily   umeclidinium bromide  1 puff Inhalation Daily   Continuous Infusions:  PRN Meds: acetaminophen, guaiFENesin-dextromethorphan, ipratropium-albuterol, levalbuterol   Vital Signs    Vitals:   01/22/21 0719 01/22/21 0800 01/22/21 1012 01/22/21 1133  BP: 111/69   108/70  Pulse:    (!) 104  Resp: 20   (!) 23  Temp: 98 F (36.7 C)   98.1 F (36.7 C)  TempSrc: Oral   Oral  SpO2: 92% 92% 94% 92%  Weight:      Height:        Intake/Output Summary (Last 24 hours) at 01/22/2021 1210 Last data filed at 01/22/2021 0800 Gross per 24 hour  Intake 440 ml  Output 2050 ml  Net -1610 ml   Filed Weights   01/18/21 2330 01/21/21 1509  Weight: 85 kg 82.7 kg    Telemetry    AF RVR - Personally Reviewed   Physical Exam   Gen: No distress, Elderly frail male   Neck: JVD to lower neck  carotid bruit Ears: bilateral Pilar Plate Sign Cardiac: No Rubs or Gallops, no Murmur, IRIR tachycardia +2 radial pulses Respiratory: No tachypnea bilateral expiratory wheezes GI: Soft, nontender, non-distended  MS: No  edema;  moves all  extremities Integument: Skin feels warm Neuro:  At time of evaluation, alert and oriented to person/place/time but hard of hearing Psych: Normal affect, patient feels better   Labs    Chemistry Recent Labs  Lab 01/18/21 1655 01/18/21 1938 01/20/21 0104 01/21/21 0127 01/22/21 0041  NA 137   < > 135 133* 134*  K 5.0   < > 4.2 4.0 4.2  CL 104   < > 100 101 96*  CO2 22   < > 23 24 27   GLUCOSE 184*   < > 107* 174* 126*  BUN 37*   < > 46* 54* 67*  CREATININE 2.35*   < > 2.35* 2.41* 2.22*  CALCIUM 9.0   < > 8.7* 8.3* 8.7*  PROT 6.4*  --   --   --   --   ALBUMIN 3.5  --   --   --   --   AST 19  --   --   --   --   ALT 15  --   --   --   --   ALKPHOS 88  --   --   --   --   BILITOT 0.5  --   --   --   --  GFRNONAA 26*   < > 26* 26* 28*  ANIONGAP 11   < > 12 8 11    < > = values in this interval not displayed.     Hematology Recent Labs  Lab 01/20/21 0104 01/21/21 0127 01/22/21 0041  WBC 14.1* 12.4* 15.9*  RBC 4.95 5.06 5.23  HGB 11.1* 11.3* 11.8*  HCT 35.8* 36.3* 37.4*  MCV 72.3* 71.7* 71.5*  MCH 22.4* 22.3* 22.6*  MCHC 31.0 31.1 31.6  RDW 21.1* 20.7* 20.7*  PLT 495* 486* 555*    Cardiac EnzymesNo results for input(s): TROPONINI in the last 168 hours. No results for input(s): TROPIPOC in the last 168 hours.   BNP Recent Labs  Lab 01/18/21 1655  BNP 2,417.5*     DDimer No results for input(s): DDIMER in the last 168 hours.   Radiology    ECHOCARDIOGRAM COMPLETE  Result Date: 01/20/2021    ECHOCARDIOGRAM REPORT   Patient Name:   Nicholas [name] Date of Exam: 01/20/2021 Medical Rec #:  [id number]      Height:       69.0 in Accession #:    [id number]     Weight:       187.4 lb Date of Birth:  1933/07/23      BSA:          2.010 m Patient Age:    88 years       BP:           119/82 mmHg Patient Gender: M              HR:           89 bpm. Exam Location:  Inpatient Procedure: 2D Echo, Cardiac Doppler, Color Doppler and Intracardiac            Opacification Agent  Indications:    Dyspnea  History:        Patient has prior history of Echocardiogram examinations. CHF,                 TIA and COPD, Arrythmias:Atrial Fibrillation; Risk                 Factors:Hypertension.  Sonographer:    Jyl Heinz Referring Phys: Theola Sequin IMPRESSIONS  1. Left ventricular ejection fraction, by estimation, is 25 to 30%. The left ventricle has severely decreased function. The left ventricle demonstrates global hypokinesis. The left ventricular internal cavity size was mildly dilated. Left ventricular diastolic parameters are consistent with Grade I diastolic dysfunction (impaired relaxation).  2. Right ventricular systolic function is mildly reduced. The right ventricular size is normal. Tricuspid regurgitation signal is inadequate for assessing PA pressure.  3. Left atrial size was moderately dilated.  4. The mitral valve is degenerative. Mild mitral valve regurgitation. No evidence of mitral stenosis.  5. The aortic valve is tricuspid. Aortic valve regurgitation is mild. Mild to moderate aortic valve stenosis. Aortic valve area, by VTI measures 1.68 cm. Aortic valve mean gradient measures 15.0 mmHg.  6. The inferior vena cava is normal in size with greater than 50% respiratory variability, suggesting right atrial pressure of 3 mmHg.  7. Moderate pericardial effusion, primarily adjacent to the RA and RV (small elsewhere). There is no evidence of cardiac tamponade. FINDINGS  Left Ventricle: Left ventricular ejection fraction, by estimation, is 25 to 30%. The left ventricle has severely decreased function. The left ventricle demonstrates global hypokinesis. The left ventricular internal cavity size was mildly dilated. There is no left ventricular  hypertrophy. Left ventricular diastolic parameters are consistent with Grade I diastolic dysfunction (impaired relaxation). Right Ventricle: The right ventricular size is normal. No increase in right ventricular wall thickness. Right  ventricular systolic function is mildly reduced. Tricuspid regurgitation signal is inadequate for assessing PA pressure. Left Atrium: Left atrial size was moderately dilated. Right Atrium: Right atrial size was normal in size. Pericardium: A moderately sized pericardial effusion is present. There is no evidence of cardiac tamponade. Mitral Valve: The mitral valve is degenerative in appearance. There is mild calcification of the mitral valve leaflet(s). Mild to moderate mitral annular calcification. Mild mitral valve regurgitation. No evidence of mitral valve stenosis. Tricuspid Valve: The tricuspid valve is normal in structure. Tricuspid valve regurgitation is not demonstrated. Aortic Valve: The aortic valve is tricuspid. Aortic valve regurgitation is mild. Mild to moderate aortic stenosis is present. Aortic valve mean gradient measures 15.0 mmHg. Aortic valve peak gradient measures 20.2 mmHg. Aortic valve area, by VTI measures  1.68 cm. Pulmonic Valve: The pulmonic valve was normal in structure. Pulmonic valve regurgitation is not visualized. Aorta: The aortic root is normal in size and structure. Venous: The inferior vena cava is normal in size with greater than 50% respiratory variability, suggesting right atrial pressure of 3 mmHg. IAS/Shunts: No atrial level shunt detected by color flow Doppler.  LEFT VENTRICLE PLAX 2D LVIDd:         6.20 cm      Diastology LVIDs:         5.60 cm      LV e' medial:    6.64 cm/s LV PW:         0.90 cm      LV E/e' medial:  15.2 LV IVS:        1.00 cm      LV e' lateral:   3.59 cm/s LVOT diam:     2.30 cm      LV E/e' lateral: 28.1 LV SV:         82 LV SV Index:   41 LVOT Area:     4.15 cm  LV Volumes (MOD) LV vol d, MOD A2C: 240.0 ml LV vol d, MOD A4C: 221.0 ml LV vol s, MOD A2C: 161.0 ml LV vol s, MOD A4C: 150.0 ml LV SV MOD A2C:     79.0 ml LV SV MOD A4C:     221.0 ml LV SV MOD BP:      75.5 ml RIGHT VENTRICLE            IVC RV Basal diam:  3.60 cm    IVC diam: 1.80 cm RV  Mid diam:    2.50 cm RV S prime:     7.62 cm/s TAPSE (M-mode): 1.8 cm LEFT ATRIUM              Index        RIGHT ATRIUM           Index LA diam:        4.70 cm  2.34 cm/m   RA Area:     17.00 cm LA Vol (A2C):   97.1 ml  48.32 ml/m  RA Volume:   42.60 ml  21.20 ml/m LA Vol (A4C):   110.0 ml 54.74 ml/m LA Biplane Vol: 108.0 ml 53.74 ml/m  AORTIC VALVE AV Area (Vmax):    1.75 cm AV Area (Vmean):   1.65 cm AV Area (VTI):     1.68 cm AV Vmax:  225.00 cm/s AV Vmean:          176.333 cm/s AV VTI:            0.490 m AV Peak Grad:      20.2 mmHg AV Mean Grad:      15.0 mmHg LVOT Vmax:         94.95 cm/s LVOT Vmean:        69.850 cm/s LVOT VTI:          0.198 m LVOT/AV VTI ratio: 0.41  AORTA Ao Root diam: 3.20 cm Ao Asc diam:  3.60 cm MITRAL VALVE MV Area (PHT): 6.17 cm     SHUNTS MV Decel Time: 123 msec     Systemic VTI:  0.20 m MR Peak grad: 91.0 mmHg     Systemic Diam: 2.30 cm MR Mean grad: 58.0 mmHg MR Vmax:      477.00 cm/s MR Vmean:     369.0 cm/s MV E velocity: 101.00 cm/s MV A velocity: 107.00 cm/s MV E/A ratio:  0.94 Dalton McleanMD Electronically signed by Franki Monte Signature Date/Time: 01/20/2021/7:49:59 PM    Final      Patient Profile     88 y.o. male with repeat hypoxic respiratory failure  Assessment & Plan    AF RVVCHADSVASC 5 HTN CKD IV COPD exacerbation URI HFrEF (now 25-30%) in the setting of new AFL RVR despite recent DCCV - cautious IV diuresis again today - limited GDMT  - continue eliquis and PO amiodarone (rates controled < 110 bpm) - BP has limited GDMT (tried uptitration 12/2020) - potential DCCV in the coming week - PC is involved assisting with goals of care    For questions or updates, please contact [place] Please consult [email or web address] for contact info under Cardiology/STEMI.      Signed, Werner Lean, MD  01/22/2021, 12:10 PM

## 2021-01-22 NOTE — Progress Notes (Signed)
Mobility Specialist Progress Note    01/22/21 1727  Mobility  Activity Contraindicated/medical hold   RN advised to hold off as pt HR >/=100 at rest. Will f/u tomorrow as schedule permits.   [facility] Mobility Specialist  M.S. 2C and 6E: [phone] M.S. 4E: [phone]

## 2021-01-22 NOTE — Plan of Care (Signed)

## 2021-01-23 DIAGNOSIS — J441 Chronic obstructive pulmonary disease with (acute) exacerbation: Secondary | ICD-10-CM | POA: Diagnosis not present

## 2021-01-23 LAB — FOLATE: Folate: 22.4 ng/mL (ref >5.9)

## 2021-01-23 LAB — BASIC METABOLIC PANEL
Anion gap: 13 (ref 5–15)
BUN: 88 mg/dL — ABNORMAL HIGH (ref 8–23)
CO2: 25 mmol/L (ref 22–32)
Calcium: 8.7 mg/dL — ABNORMAL LOW (ref 8.9–10.3)
Chloride: 93 mmol/L — ABNORMAL LOW (ref 98–111)
Creatinine, Ser: 2.78 mg/dL — ABNORMAL HIGH (ref 0.61–1.24)
GFR, Estimated: 22 mL/min — ABNORMAL LOW (ref >60)
Glucose, Bld: 155 mg/dL — ABNORMAL HIGH (ref 70–99)
Potassium: 4.7 mmol/L (ref 3.5–5.1)
Sodium: 131 mmol/L — ABNORMAL LOW (ref 135–145)

## 2021-01-23 LAB — CBC WITH DIFFERENTIAL/PLATELET
Abs Immature Granulocytes: 0 10*3/uL (ref 0.00–0.07)
Basophils Absolute: 0 10*3/uL (ref 0.0–0.1)
Basophils Relative: 0 %
Eosinophils Absolute: 0 10*3/uL (ref 0.0–0.5)
Eosinophils Relative: 0 %
HCT: 37.8 % — ABNORMAL LOW (ref 39.0–52.0)
Hemoglobin: 11.8 g/dL — ABNORMAL LOW (ref 13.0–17.0)
Lymphocytes Relative: 20 %
Lymphs Abs: 3.3 10*3/uL (ref 0.7–4.0)
MCH: 22.3 pg — ABNORMAL LOW (ref 26.0–34.0)
MCHC: 31.2 g/dL (ref 30.0–36.0)
MCV: 71.6 fL — ABNORMAL LOW (ref 80.0–100.0)
Monocytes Absolute: 0.7 10*3/uL (ref 0.1–1.0)
Monocytes Relative: 4 %
Neutro Abs: 12.7 10*3/uL — ABNORMAL HIGH (ref 1.7–7.7)
Neutrophils Relative %: 76 %
Platelets: 535 10*3/uL — ABNORMAL HIGH (ref 150–400)
RBC: 5.28 MIL/uL (ref 4.22–5.81)
RDW: 20.7 % — ABNORMAL HIGH (ref 11.5–15.5)
WBC: 16.7 10*3/uL — ABNORMAL HIGH (ref 4.0–10.5)
nRBC: 0 % (ref 0.0–0.2)
nRBC: 0 /100 WBC

## 2021-01-23 LAB — IRON AND TIBC
Iron: 39 ug/dL — ABNORMAL LOW (ref 45–182)
Saturation Ratios: 11 % — ABNORMAL LOW (ref 17.9–39.5)
TIBC: 364 ug/dL (ref 250–450)
UIBC: 325 ug/dL

## 2021-01-23 LAB — RETICULOCYTES
Immature Retic Fract: 17.2 % — ABNORMAL HIGH (ref 2.3–15.9)
RBC.: 5.96 MIL/uL — ABNORMAL HIGH (ref 4.22–5.81)
Retic Count, Absolute: 61.4 10*3/uL (ref 19.0–186.0)
Retic Ct Pct: 1 % (ref 0.4–3.1)

## 2021-01-23 LAB — VITAMIN B12: Vitamin B-12: 351 pg/mL (ref 180–914)

## 2021-01-23 LAB — FERRITIN: Ferritin: 211 ng/mL (ref 24–336)

## 2021-01-23 MED ORDER — METOPROLOL SUCCINATE ER 25 MG PO TB24
25.0000 mg | ORAL_TABLET | Freq: Every day | ORAL | Status: DC
  Administered 2021-01-23 – 2021-01-27 (×5): 25 mg via ORAL
  Filled 2021-01-23 (×5): qty 1

## 2021-01-23 MED ORDER — PREDNISONE 20 MG PO TABS
40.0000 mg | ORAL_TABLET | Freq: Every day | ORAL | Status: AC
  Administered 2021-01-24 – 2021-01-26 (×3): 40 mg via ORAL
  Filled 2021-01-23 (×3): qty 2

## 2021-01-23 MED ORDER — POLYETHYLENE GLYCOL 3350 17 G PO PACK
17.0000 g | PACK | Freq: Every day | ORAL | Status: DC
  Administered 2021-01-23 – 2021-01-28 (×5): 17 g via ORAL
  Filled 2021-01-23 (×5): qty 1

## 2021-01-23 MED ORDER — SENNOSIDES-DOCUSATE SODIUM 8.6-50 MG PO TABS
2.0000 | ORAL_TABLET | Freq: Two times a day (BID) | ORAL | Status: DC
  Administered 2021-01-23 – 2021-01-28 (×9): 2 via ORAL
  Filled 2021-01-23 (×10): qty 2

## 2021-01-23 NOTE — Progress Notes (Signed)
Progress Note  Patient Name: Nicholas [name] Date of Encounter: 01/23/2021  Primary Cardiologist:   Shirlee More, MD   Subjective   He says that he is breathing OK and he is not having pain  Inpatient Medications    Scheduled Meds:  allopurinol  100 mg Oral Daily   amiodarone  400 mg Oral BID   amLODipine  5 mg Oral Daily   apixaban  2.5 mg Oral BID   dextromethorphan-guaiFENesin  1 tablet Oral BID   feeding supplement  237 mL Oral BID BM   furosemide  60 mg Intravenous BID   latanoprost  1 drop Both Eyes QHS   methylPREDNISolone (SOLU-MEDROL) injection  60 mg Intravenous Daily   metoprolol succinate  12.5 mg Oral Daily   mometasone-formoterol  2 puff Inhalation BID   multivitamin with minerals  1 tablet Oral q morning   pantoprazole  20 mg Oral Daily   umeclidinium bromide  1 puff Inhalation Daily   Continuous Infusions:  PRN Meds: acetaminophen, guaiFENesin-dextromethorphan, ipratropium-albuterol   Vital Signs    Vitals:   01/23/21 0003 01/23/21 0413 01/23/21 0726 01/23/21 0844  BP: 119/74 106/70 112/67   Pulse: 97 79 (!) 109 (!) 108  Resp: 18 19 16 18   Temp: 98.4 F (36.9 C) (!) 97.4 F (36.3 C) 97.8 F (36.6 C)   TempSrc: Oral Oral Oral   SpO2: 90% 90% 93% 94%  Weight:      Height:        Intake/Output Summary (Last 24 hours) at 01/23/2021 0913 Last data filed at 01/23/2021 0800 Gross per 24 hour  Intake 1304 ml  Output 2580 ml  Net -1276 ml   Filed Weights   01/18/21 2330 01/21/21 1509  Weight: 85 kg 82.7 kg    Telemetry    Atrial fib/flutter with rapid rate - Personally Reviewed  ECG    NA - Personally Reviewed  Physical Exam   GEN: No acute distress.   Neck: No  JVD Cardiac: RRR, 3/6 systolic murmur over the entire precordium, no diastolic murmurs, rubs, or gallops.  Respiratory:     Decreased breath sounds bilaterally.  GI: Soft, nontender, non-distended  MS: No  edema; No deformity. Neuro:  Nonfocal  Psych: Normal affect    Labs    Chemistry Recent Labs  Lab 01/18/21 1655 01/18/21 1938 01/20/21 0104 01/21/21 0127 01/22/21 0041  NA 137   < > 135 133* 134*  K 5.0   < > 4.2 4.0 4.2  CL 104   < > 100 101 96*  CO2 22   < > 23 24 27   GLUCOSE 184*   < > 107* 174* 126*  BUN 37*   < > 46* 54* 67*  CREATININE 2.35*   < > 2.35* 2.41* 2.22*  CALCIUM 9.0   < > 8.7* 8.3* 8.7*  PROT 6.4*  --   --   --   --   ALBUMIN 3.5  --   --   --   --   AST 19  --   --   --   --   ALT 15  --   --   --   --   ALKPHOS 88  --   --   --   --   BILITOT 0.5  --   --   --   --   GFRNONAA 26*   < > 26* 26* 28*  ANIONGAP 11   < > 12 8 11    < > =  values in this interval not displayed.     Hematology Recent Labs  Lab 01/21/21 0127 01/22/21 0041 01/23/21 0037  WBC 12.4* 15.9* 16.7*  RBC 5.06 5.23 5.28  HGB 11.3* 11.8* 11.8*  HCT 36.3* 37.4* 37.8*  MCV 71.7* 71.5* 71.6*  MCH 22.3* 22.6* 22.3*  MCHC 31.1 31.6 31.2  RDW 20.7* 20.7* 20.7*  PLT 486* 555* 535*    Cardiac EnzymesNo results for input(s): TROPONINI in the last 168 hours. No results for input(s): TROPIPOC in the last 168 hours.   BNP Recent Labs  Lab 01/18/21 1655  BNP 2,417.5*     DDimer No results for input(s): DDIMER in the last 168 hours.   Radiology    No results found.  Cardiac Studies    1. Left ventricular ejection fraction, by estimation, is 25 to 30%. The  left ventricle has severely decreased function. The left ventricle  demonstrates global hypokinesis. The left ventricular internal cavity size  was mildly dilated. Left ventricular  diastolic parameters are consistent with Grade I diastolic dysfunction  (impaired relaxation).   2. Right ventricular systolic function is mildly reduced. The right  ventricular size is normal. Tricuspid regurgitation signal is inadequate  for assessing PA pressure.   3. Left atrial size was moderately dilated.   4. The mitral valve is degenerative. Mild mitral valve regurgitation. No  evidence of  mitral stenosis.   5. The aortic valve is tricuspid. Aortic valve regurgitation is mild.  Mild to moderate aortic valve stenosis. Aortic valve area, by VTI measures  1.68 cm. Aortic valve mean gradient measures 15.0 mmHg.   6. The inferior vena cava is normal in size with greater than 50%  respiratory variability, suggesting right atrial pressure of 3 mmHg.   7. Moderate pericardial effusion, primarily adjacent to the RA and RV  (small elsewhere). There is no evidence of cardiac tamponade.   Patient Profile     88 y.o. male with repeat hypoxic respiratory failure.  Medical history significant of chronic combined CHF, PAF, CKD 3b, COPD  Assessment & Plan    HF with reduced EF:  Seems to have stable volume status.  He is getting IV Lasix and his creat is tolerating this.   Continue.   Atrial fib:  Had cardioversion on 1/6 and stayed in NSR for a while but now back in fib with acute exacerbation of respiratory problems.    On amiodarone and Eliquis.   Will need DCCV before discharge.  Increase beta blocker today.    For questions or updates, please contact [place] Please consult [email or web address] for contact info under Cardiology/STEMI.   Signed, Minus Breeding, MD  01/23/2021, 9:13 AM

## 2021-01-23 NOTE — Progress Notes (Signed)
PROGRESS NOTE    Nicholas [name]  TKZ:[id number] DOB: Mar 09, 1933 DOA: 01/18/2021 PCP: Street, [name], MD  Chief Complaint  Patient presents with   Shortness of Breath    Brief Narrative:   Nicholas [name] is a 88 y.o. male with medical history significant of chronic combined CHF, PAF, CKD 3b s/p cardioversion for A.Fib at hospital x5 days ago. Since discharge pt developed wheezing, SOB, over the past week.  Symptoms persistent and worsening. Went to [facility] yesterday 1/9, tested positive for influenza B per report .Got steroids, doxycycline, and antiviral Rx. Pt presents with worsening sob. He was admitted for acute respiratory failure with hypoxia from acute copd exacerbation.   Assessment & Plan:   Principal Problem:   COPD exacerbation ([place]) Active Problems:   HTN (hypertension)   Stage 3b chronic kidney disease (CKD) (HCC)   Chronic combined systolic and diastolic CHF (congestive heart failure) (HCC)   A-fib (HCC)   Influenza B   Acute respiratory failure with hypoxia sec to acute copd exacerbation and acute on chronic combined systolic and diastolic CHF.  Still requiring 2 lit of [place] oxygen, wheezing has improved with duonebs. Transitioned to oral steroids for a quick taper over the next few days.  Continue with IV lasix 60 mg daily. Renal parameters are stable around 2.2. recheck BMP today.  Echocardiogram showed LVEF of estimation, is 25 to 30%. The left ventricle has severely decreased function. The left ventricle demonstrates global hypokinesis. The left ventricular internal cavity size was mildly dilated. Left ventricular diastolic parameters are consistent with Grade I diastolic dysfunction (impaired relaxation). This is new compared tot he echo from 10/22.  Cardiology consulted and recommendations given.  Continue with strict intake and output, monitor daily weights and renal parameters while on IV lasix.  Potassium wnl.  Therapy evaluations recommending [facility] refusing home health care.     Stage 3 b CKD: Monitor renal parameters today.    Mild hyponatremia.    Paroxysmal Atrial fibrillation:  S/p cardioversion last week. Pt had an episode of afib after duoneb treatment.  On eliquis for anticoagulation, and metoprolol for rate control.  Plan for cardioversion if pt 's continues to go into atib.   Influenza:  Completed the course of tamiflu.   Hypertension:  BP parameters are optimal.   Leukocytosis:  From steroids.    Diarrhea possibly from antibiotics.  Appears to have resolved.    Mild microcytic anemia:  Get anemia panel.    Thrombocytosis:  Probably reactive.   DVT prophylaxis: on eliquis.  Code Status: DNR.  Family Communication: None at bedside.  Disposition:   Status is: Inpatient  Remains inpatient appropriate because: acute hypoxemic respiratory failure.        Consultants:  Cardiology.   Procedures: Echocardiogram.   Antimicrobials:  Antibiotics Given (last 72 hours)     Date/Time Action Medication Dose   01/20/21 1019 Given   oseltamivir (TAMIFLU) capsule 30 mg 30 mg   01/21/21 1011 Given   oseltamivir (TAMIFLU) capsule 30 mg 30 mg         Subjective: Looks better today, pt denies any new complaints. Breathing has improved. Not much cough,n o diarrhea.  Objective: Vitals:   01/23/21 0003 01/23/21 0413 01/23/21 0726 01/23/21 0844  BP: 119/74 106/70 112/67   Pulse: 97 79 (!) 109 (!) 108  Resp: 18 19 16 18   Temp: 98.4 F (36.9 C) (!) 97.4 F (36.3 C) 97.8 F (36.6 C)  TempSrc: Oral Oral Oral   SpO2: 90% 90% 93% 94%  Weight:      Height:        Intake/Output Summary (Last 24 hours) at 01/23/2021 0940 Last data filed at 01/23/2021 0800 Gross per 24 hour  Intake 1304 ml  Output 2580 ml  Net -1276 ml    Filed Weights   01/18/21 2330 01/21/21 1509  Weight: 85 kg 82.7 kg    Examination:  General exam: elderly gentleman on 2lit of [place] oxygen.  Respiratory system: Clear  to auscultation. Respiratory effort normal. Cardiovascular system: S1 & S2 heard, irregularly irregular, No pedal edema. Gastrointestinal system: Abdomen is nondistended, soft and nontender. Normal bowel sounds heard. Central nervous system: Alert and oriented. No focal neurological deficits. Extremities: Symmetric 5 x 5 power. Skin: No rashes, lesions or ulcers Psychiatry: Mood & affect appropriate.        Data Reviewed: I have personally reviewed following labs and imaging studies  CBC: Recent Labs  Lab 01/18/21 1655 01/18/21 1938 01/20/21 0104 01/21/21 0127 01/22/21 0041 01/23/21 0037  WBC 11.5*  --  14.1* 12.4* 15.9* 16.7*  NEUTROABS  --   --  10.5* 8.5* 11.1* 12.7*  HGB 10.8* 12.6* 11.1* 11.3* 11.8* 11.8*  HCT 35.5* 37.0* 35.8* 36.3* 37.4* 37.8*  MCV 72.6*  --  72.3* 71.7* 71.5* 71.6*  PLT 471*  --  495* 486* 555* 535*     Basic Metabolic Panel: Recent Labs  Lab 01/18/21 1655 01/18/21 1938 01/19/21 0543 01/20/21 0104 01/21/21 0127 01/22/21 0041  NA 137 137 136 135 133* 134*  K 5.0 4.9 4.5 4.2 4.0 4.2  CL 104  --  103 100 101 96*  CO2 22  --  23 23 24 27   GLUCOSE 184*  --  128* 107* 174* 126*  BUN 37*  --  34* 46* 54* 67*  CREATININE 2.35*  --  2.36* 2.35* 2.41* 2.22*  CALCIUM 9.0  --  8.7* 8.7* 8.3* 8.7*     GFR: Estimated Creatinine Clearance: 24.3 mL/min (A) (by C-G formula based on SCr of 2.22 mg/dL (H)).  Liver Function Tests: Recent Labs  Lab 01/18/21 1655  AST 19  ALT 15  ALKPHOS 88  BILITOT 0.5  PROT 6.4*  ALBUMIN 3.5     CBG: No results for input(s): GLUCAP in the last 168 hours.   Recent Results (from the past 240 hour(s))  Resp Panel by RT-PCR (Flu A&B, Covid) Nasopharyngeal Swab     Status: None   Collection Time: 01/18/21  5:00 PM   Specimen: Nasopharyngeal Swab; Nasopharyngeal(NP) swabs in vial transport medium  Result Value Ref Range Status   SARS Coronavirus 2 by RT PCR NEGATIVE NEGATIVE Final    Comment:  (NOTE) SARS-CoV-2 target nucleic acids are NOT DETECTED.  The SARS-CoV-2 RNA is generally detectable in upper respiratory specimens during the acute phase of infection. The lowest concentration of SARS-CoV-2 viral copies this assay can detect is 138 copies/mL. A negative result does not preclude SARS-Cov-2 infection and should not be used as the sole basis for treatment or other patient management decisions. A negative result may occur with  improper specimen collection/handling, submission of specimen other than nasopharyngeal swab, presence of viral mutation(s) within the areas targeted by this assay, and inadequate number of viral copies(<138 copies/mL). A negative result must be combined with clinical observations, patient history, and epidemiological information. The expected result is Negative.  Fact Sheet for Patients:  EntrepreneurPulse.com.au  Fact Sheet for Healthcare Providers:  IncredibleEmployment.be  This test is no t yet approved or cleared by the [place] and  has been authorized for detection and/or diagnosis of SARS-CoV-2 by FDA under an Emergency Use Authorization (EUA). This EUA will remain  in effect (meaning this test can be used) for the duration of the COVID-19 declaration under Section 564(b)(1) of the Act, 21 U.S.C.section 360bbb-3(b)(1), unless the authorization is terminated  or revoked sooner.       Influenza A by PCR NEGATIVE NEGATIVE Final   Influenza B by PCR NEGATIVE NEGATIVE Final    Comment: (NOTE) The Xpert Xpress SARS-CoV-2/FLU/RSV plus assay is intended as an aid in the diagnosis of influenza from Nasopharyngeal swab specimens and should not be used as a sole basis for treatment. Nasal washings and aspirates are unacceptable for Xpert Xpress SARS-CoV-2/FLU/RSV testing.  Fact Sheet for Patients: EntrepreneurPulse.com.au  Fact Sheet for Healthcare  Providers: IncredibleEmployment.be  This test is not yet approved or cleared by the [place] FDA and has been authorized for detection and/or diagnosis of SARS-CoV-2 by FDA under an Emergency Use Authorization (EUA). This EUA will remain in effect (meaning this test can be used) for the duration of the COVID-19 declaration under Section 564(b)(1) of the Act, 21 U.S.C. section 360bbb-3(b)(1), unless the authorization is terminated or revoked.  Performed at [facility], [address], [place], [place] [address]   MRSA Next Gen by PCR, Nasal     Status: None   Collection Time: 01/18/21 11:40 PM   Specimen: Nasal Mucosa; Nasal Swab  Result Value Ref Range Status   MRSA by PCR Next Gen NOT DETECTED NOT DETECTED Final    Comment: (NOTE) The GeneXpert MRSA Assay (FDA approved for NASAL specimens only), is one component of a comprehensive MRSA colonization surveillance program. It is not intended to diagnose MRSA infection nor to guide or monitor treatment for MRSA infections. Test performance is not FDA approved in patients less than 37 years old. Performed at [facility], [place] [address] [place],  [address]           Radiology Studies: No results found.      Scheduled Meds:  allopurinol  100 mg Oral Daily   amiodarone  400 mg Oral BID   amLODipine  5 mg Oral Daily   apixaban  2.5 mg Oral BID   dextromethorphan-guaiFENesin  1 tablet Oral BID   feeding supplement  237 mL Oral BID BM   furosemide  60 mg Intravenous BID   latanoprost  1 drop Both Eyes QHS   methylPREDNISolone (SOLU-MEDROL) injection  60 mg Intravenous Daily   metoprolol succinate  25 mg Oral Daily   mometasone-formoterol  2 puff Inhalation BID   multivitamin with minerals  1 tablet Oral q morning   pantoprazole  20 mg Oral Daily   umeclidinium bromide  1 puff Inhalation Daily   Continuous Infusions:   LOS: 5 days       Hosie Poisson,  MD [facility]   To contact the attending provider between 7A-7P or the covering provider during after hours 7P-7A, please log into the web site [email or web address] and access using universal [place] password for that web site. If you do not have the password, please call the hospital operator.  01/23/2021, 9:40 AM

## 2021-01-23 NOTE — Plan of Care (Signed)

## 2021-01-24 ENCOUNTER — Inpatient Hospital Stay (HOSPITAL_COMMUNITY): Payer: PPO

## 2021-01-24 LAB — BASIC METABOLIC PANEL
Anion gap: 14 (ref 5–15)
BUN: 94 mg/dL — ABNORMAL HIGH (ref 8–23)
CO2: 26 mmol/L (ref 22–32)
Calcium: 8.8 mg/dL — ABNORMAL LOW (ref 8.9–10.3)
Chloride: 92 mmol/L — ABNORMAL LOW (ref 98–111)
Creatinine, Ser: 2.61 mg/dL — ABNORMAL HIGH (ref 0.61–1.24)
GFR, Estimated: 23 mL/min — ABNORMAL LOW (ref >60)
Glucose, Bld: 123 mg/dL — ABNORMAL HIGH (ref 70–99)
Potassium: 4.4 mmol/L (ref 3.5–5.1)
Sodium: 132 mmol/L — ABNORMAL LOW (ref 135–145)

## 2021-01-24 LAB — CBC WITH DIFFERENTIAL/PLATELET
Abs Immature Granulocytes: 0 10*3/uL (ref 0.00–0.07)
Basophils Absolute: 0 10*3/uL (ref 0.0–0.1)
Basophils Relative: 0 %
Eosinophils Absolute: 0 10*3/uL (ref 0.0–0.5)
Eosinophils Relative: 0 %
HCT: 39.5 % (ref 39.0–52.0)
Hemoglobin: 12.3 g/dL — ABNORMAL LOW (ref 13.0–17.0)
Lymphocytes Relative: 32 %
Lymphs Abs: 5.4 10*3/uL — ABNORMAL HIGH (ref 0.7–4.0)
MCH: 22.3 pg — ABNORMAL LOW (ref 26.0–34.0)
MCHC: 31.1 g/dL (ref 30.0–36.0)
MCV: 71.6 fL — ABNORMAL LOW (ref 80.0–100.0)
Monocytes Absolute: 0 10*3/uL — ABNORMAL LOW (ref 0.1–1.0)
Monocytes Relative: 0 %
Neutro Abs: 11.6 10*3/uL — ABNORMAL HIGH (ref 1.7–7.7)
Neutrophils Relative %: 68 %
Platelets: 521 10*3/uL — ABNORMAL HIGH (ref 150–400)
RBC: 5.52 MIL/uL (ref 4.22–5.81)
RDW: 20.9 % — ABNORMAL HIGH (ref 11.5–15.5)
WBC: 17 10*3/uL — ABNORMAL HIGH (ref 4.0–10.5)
nRBC: 0 % (ref 0.0–0.2)
nRBC: 1 /100 WBC — ABNORMAL HIGH

## 2021-01-24 MED ORDER — IPRATROPIUM-ALBUTEROL 0.5-2.5 (3) MG/3ML IN SOLN
3.0000 mL | Freq: Three times a day (TID) | RESPIRATORY_TRACT | Status: DC
  Administered 2021-01-24 – 2021-01-26 (×4): 3 mL via RESPIRATORY_TRACT
  Filled 2021-01-24 (×5): qty 3

## 2021-01-24 MED ORDER — SODIUM CHLORIDE 0.9 % IV SOLN
510.0000 mg | Freq: Once | INTRAVENOUS | Status: AC
  Administered 2021-01-24: 510 mg via INTRAVENOUS
  Filled 2021-01-24: qty 17

## 2021-01-24 MED ORDER — FUROSEMIDE 40 MG PO TABS
40.0000 mg | ORAL_TABLET | Freq: Two times a day (BID) | ORAL | Status: DC
  Administered 2021-01-25 – 2021-01-26 (×3): 40 mg via ORAL
  Filled 2021-01-24 (×3): qty 1

## 2021-01-24 MED ORDER — FUROSEMIDE 40 MG PO TABS: 40.0000 mg | ORAL_TABLET | Freq: Two times a day (BID) | ORAL | Status: DC

## 2021-01-24 MED ORDER — IPRATROPIUM-ALBUTEROL 0.5-2.5 (3) MG/3ML IN SOLN: 3.0000 mL | RESPIRATORY_TRACT | Status: DC

## 2021-01-24 NOTE — Progress Notes (Addendum)
Advanced Heart Failure Rounding Note  PCP-Cardiologist: Shirlee More, MD   Subjective:   Admit weight 187---> 177 pounds.  Yesterday Toprol XL increased to 25 mg daily.   He has been diuresing with IV lasix. Negative 1.9.   Feels ok. Wants to walk.    Objective:   Weight Range: 80.5 kg Body mass index is 26.2 kg/m.   Vital Signs:   Temp:  [97.6 F (36.4 C)-98.2 F (36.8 C)] 97.6 F (36.4 C) (01/16 0801) Pulse Rate:  [100-106] 106 (01/16 0801) Resp:  [17-20] 17 (01/16 0801) BP: (105-120)/(68-80) 105/68 (01/16 0801) SpO2:  [92 %-98 %] 93 % (01/16 0801) Weight:  [80.5 kg] 80.5 kg (01/16 0801) Last BM Date: 01/20/21  Weight change: Filed Weights   01/18/21 2330 01/21/21 1509 01/24/21 0801  Weight: 85 kg 82.7 kg 80.5 kg    Intake/Output:   Intake/Output Summary (Last 24 hours) at 01/24/2021 0858 Last data filed at 01/24/2021 0500 Gross per 24 hour  Intake 360 ml  Output 2500 ml  Net -2140 ml      Physical Exam    General:   No resp difficulty. Sitting on the side of the bed.  HEENT: Normal Neck: Supple. JVP 6-7 . Carotids 2+ bilat; no bruits. No lymphadenopathy or thyromegaly appreciated. Cor: PMI nondisplaced. Irregular rate & rhythm. No rubs, gallops or murmurs. Lungs: Rhonchi on 2 liters.  Abdomen: Soft, nontender, nondistended. No hepatosplenomegaly. No bruits or masses. Good bowel sounds. Extremities: No cyanosis, clubbing, rash, edema Neuro: Alert & orientedx3, cranial nerves grossly intact. moves all 4 extremities w/o difficulty. Affect pleasant   Telemetry  A fib 100s personally reviewed.   EKG    N/A   Labs    CBC Recent Labs    01/23/21 0037 01/24/21 0046  WBC 16.7* 17.0*  NEUTROABS 12.7* 11.6*  HGB 11.8* 12.3*  HCT 37.8* 39.5  MCV 71.6* 71.6*  PLT 535* 450*   Basic Metabolic Panel Recent Labs    01/23/21 1358 01/24/21 0046  NA 131* 132*  K 4.7 4.4  CL 93* 92*  CO2 25 26  GLUCOSE 155* 123*  BUN 88* 94*  CREATININE  2.78* 2.61*  CALCIUM 8.7* 8.8*   Liver Function Tests No results for input(s): AST, ALT, ALKPHOS, BILITOT, PROT, ALBUMIN in the last 72 hours. No results for input(s): LIPASE, AMYLASE in the last 72 hours. Cardiac Enzymes No results for input(s): CKTOTAL, CKMB, CKMBINDEX, TROPONINI in the last 72 hours.  BNP: BNP (last 3 results) Recent Labs    12/24/20 0941 12/26/20 0404 01/18/21 1655  BNP 1,084.1* 1,213.1* 2,417.5*    ProBNP (last 3 results) Recent Labs    10/15/20 0841 10/27/20 0936 12/15/20 1127  PROBNP 5,768* 6,081* 18,617*     D-Dimer No results for input(s): DDIMER in the last 72 hours. Hemoglobin A1C No results for input(s): HGBA1C in the last 72 hours. Fasting Lipid Panel No results for input(s): CHOL, HDL, LDLCALC, TRIG, CHOLHDL, LDLDIRECT in the last 72 hours. Thyroid Function Tests No results for input(s): TSH, T4TOTAL, T3FREE, THYROIDAB in the last 72 hours.  Invalid input(s): FREET3  Other results:   Imaging    No results found.   Medications:     Scheduled Medications:  allopurinol  100 mg Oral Daily   amiodarone  400 mg Oral BID   apixaban  2.5 mg Oral BID   dextromethorphan-guaiFENesin  1 tablet Oral BID   feeding supplement  237 mL Oral BID BM   furosemide  60 mg Intravenous BID   latanoprost  1 drop Both Eyes QHS   metoprolol succinate  25 mg Oral Daily   mometasone-formoterol  2 puff Inhalation BID   multivitamin with minerals  1 tablet Oral q morning   pantoprazole  20 mg Oral Daily   polyethylene glycol  17 g Oral Daily   predniSONE  40 mg Oral QAC breakfast   senna-docusate  2 tablet Oral BID   umeclidinium bromide  1 puff Inhalation Daily    Infusions:   PRN Medications: acetaminophen, guaiFENesin-dextromethorphan, ipratropium-albuterol    Patient Profile   Nicholas [name] is a 88 year old with history of CKD Stage IIIB, A flutter, HFmEF, HTN, TIA, IDA, and gout. He has not had an ischemic work up.     Admitted with  acute hypoxic respiratory failure, AECOPD + A/C HF.   Assessment/Plan  A/C HFmEF -NICM suspect tachy-mediated but he has not had an ischemic work up. Could consider myoview.  - Echo 01/20/21 25-30%---> 12/2020 EF 40-45%  - Diuresed with IV lasix. Overall weight down 10 pounds. Stop IV lasix. Start lasix 40 mg twice a day tomorrow.    --BB-Continue Toprol XL 25 mg daily  -Ace/ARB/ARNI- no elevated creatinine -MRA-- no elevated creatinine  -SGLT2i- Could consider jardiance but with CKD Stage IV hold off.  - stop amlodipine.  - Renal function stable.    2. PAF -S/P DC-CV 12/2020 --> NSR - Went back in A fib and had another DC_CV 01/15/20--> SR - Went back in A fib 1/13.   - Continue amio 400 mg twice a day + Toprol XL 25 mg daily. - Continue eliquis 2.5 mg twice a day.  ? Watchman would be a consideration.  - Will need to consider another cardioversion. Would like for him to recover from this exacerbation. From HF perspective he appears stable. Hopefully he will spontaneously convert.    3. Acute Hypoxic Respiratory Failure in the setting of AECOPD - Given steroids, doxycycline + tamiflu at Urgent Care on 1/9  - On admit started on steroid taper and bronchodilators with improvement.  -Now down to 2 liters with stable O2 sats. He is not on oxygen at home. - check oxygen when walking.     4. HTN  Stable.    5. CKD Stage IV -Creatinine baseline 2.4-2.6 -Todays creatinine trending up 2.6 BUN up to 94. Hold diuretics.  - Followed by Dr Justin Mend   6. IDA -He was seen by Hematology 11/04/20 for anemia.  -Iron Sats 4%. Had Iron transfusions x2 - 1/15 Iron sats 11%.  - Hgb stable 12.3  - Recommendations for GI follow up.    7. DNR  -Palliative Care following.  -He was referred to outpatient Palliative   Ambulate.   Length of Stay: [place], NP  01/24/2021, 8:58 AM  Advanced Heart Failure Team Pager [phone] (M-F; 7a - 5p)  Please contact [facility] for night-coverage  after hours (5p -7a ) and weekends on [email or web address]  Patient seen and examined with the above-signed Advanced Practice Provider and/or Housestaff. I personally reviewed laboratory data, imaging studies and relevant notes. I independently examined the patient and formulated the important aspects of the plan. I have edited the note to reflect any of my changes or salient points. I have personally discussed the plan with the patient and/or family.  Remains in AF with mildly elevated rates. Still very rhonchorous. SCr and BUN elevated.  General:  Elderly male lying in bed .No  resp difficulty HEENT: normal Neck: supple. JVP 7-8 Carotids 2+ bilat; no bruits. No lymphadenopathy or thryomegaly appreciated. Cor: PMI nondisplaced. Irregular rate & rhythm. No rubs, gallops or murmurs. Lungs: diffuse rhonchi  Abdomen: soft, nontender, nondistended. No hepatosplenomegaly. No bruits or masses. Good bowel sounds. Extremities: no cyanosis, clubbing, rash, edema Neuro: alert & orientedx3, cranial nerves grossly intact. moves all 4 extremities w/o difficulty. Affect pleasant  He remains in AF with mild RVR. Still quite tenuous from respiratory standpoint. Continue amio for now. Continue Eliquis. Would not push b-blocker in setting of his respiratory compromise. Ok to hold diuretics today.   Plan DC-CV when more stable from pulmonary standpoint.   Glori Bickers, MD  2:21 PM

## 2021-01-24 NOTE — Progress Notes (Signed)
Mobility Specialist Progress Note    01/24/21 1100  Mobility  Activity Ambulated in hall  Level of Assistance Contact guard assist, steadying assist  Assistive Device Front wheel walker  Distance Ambulated (ft) 340 ft  Mobility Ambulated with assistance in hallway  Mobility Response Tolerated fair  Mobility performed by Mobility specialist  Bed Position Chair  $Mobility charge 1 Mobility   Pre-Mobility: 101 HR, 115/90 BP, 93% on 2L SpO2 Post-Mobility: 104 HR, 93% on RA SpO2  Pt received in bed and agreeable. Pt slightly unsteady with RW. Ambulated on RA with no c/o but SpO2 would desat into lower 80s. Pt quickly recovered back into low 90s with a break and pursed lip breathing. Encouraged focus on pursed lip breathing throughout walk. Returned to [facility] for BM. Left in chair with call bell in reach on RA under advisement of RN and son present.   [facility] Mobility Specialist  M.S. 2C and 6E: [phone] M.S. 4E: [phone]

## 2021-01-24 NOTE — Progress Notes (Signed)
PROGRESS NOTE    GUTHRIE LEMME  FOY:[id number] DOB: Nov 23, 1933 DOA: 01/18/2021 PCP: Street, [name], MD  Chief Complaint  Patient presents with   Shortness of Breath    Brief Narrative:   TIYON [name] is a 88 y.o. male with medical history significant of chronic combined CHF, PAF, CKD 3b s/p cardioversion for A.Fib at hospital x5 days ago. Since discharge pt developed wheezing, SOB, over the past week.  Symptoms persistent and worsening. Went to [facility] yesterday 1/9, tested positive for influenza B per report .Got steroids, doxycycline, and antiviral Rx. Pt presents with worsening sob. He was admitted for acute respiratory failure with hypoxia from acute copd exacerbation.   Assessment & Plan:   Principal Problem:   COPD exacerbation ([place]) Active Problems:   HTN (hypertension)   Stage 3b chronic kidney disease (CKD) (HCC)   Chronic combined systolic and diastolic CHF (congestive heart failure) (HCC)   A-fib (HCC)   Influenza B   Acute respiratory failure with hypoxia sec to acute copd exacerbation and acute on chronic combined systolic and diastolic CHF.  Still requiring 2 lit of [place] oxygen, wheezing has improved with duonebs. Transitioned to oral steroids for a quick taper over the next few days.  Started on IV lasix 60 mg BID, transitioned to oral lasix today. Monitor renal parameters.  Echocardiogram showed LVEF of estimation, is 25 to 30%. The left ventricle has severely decreased function. The left ventricle demonstrates global hypokinesis. The left ventricular internal cavity size was mildly dilated. Left ventricular diastolic parameters are consistent with Grade I diastolic dysfunction (impaired relaxation). This is new compared tot he echo from 10/22.  Cardiology consulted and recommendations given.  Continue with strict intake and output, monitor daily weights and renal parameters while on IV lasix.  Potassium wnl.  Therapy evaluations recommending [place]  refusing home health care.  Follow CXR.    Mild Aki on Stage 3 b CKD: From diuresis. Continue to monitor.    Mild hyponatremia.  From diuresis. Monitor.    Paroxysmal Atrial fibrillation:  S/p cardioversion last week. Pt had an episode of afib after duoneb treatment.  On eliquis for anticoagulation, and metoprolol for rate control.  Plan for cardioversion if pt 's continues to be afib  Influenza:  Completed the course of tamiflu.   Hypertension:  BP parameters are optimal.   Leukocytosis:  From steroids.    Diarrhea possibly from antibiotics.  Appears to have resolved.    Mild microcytic anemia:  Get anemia panel.  Low iron levels on panel   Thrombocytosis:  Probably reactive.   DVT prophylaxis: on eliquis.  Code Status: DNR.  Family Communication: None at bedside.  Disposition:   Status is: Inpatient  Remains inpatient appropriate because: acute hypoxemic respiratory failure.        Consultants:  Cardiology.   Procedures: Echocardiogram.   Antimicrobials:  Antibiotics Given (last 72 hours)     None         Subjective: BREATHING has improved Objective: Vitals:   01/23/21 2336 01/24/21 0500 01/24/21 0801 01/24/21 1116  BP: 117/74 114/73 105/68 94/68  Pulse: (!) 105 (!) 104 (!) 106   Resp: 20 19 17    Temp: 97.8 F (36.6 C) 97.6 F (36.4 C) 97.6 F (36.4 C) (!) 97.1 F (36.2 C)  TempSrc: Oral Oral Oral Axillary  SpO2: 96% 98% 93%   Weight:   80.5 kg   Height:        Intake/Output Summary (  Last 24 hours) at 01/24/2021 1352 Last data filed at 01/24/2021 1100 Gross per 24 hour  Intake 180 ml  Output 1250 ml  Net -1070 ml    Filed Weights   01/18/21 2330 01/21/21 1509 01/24/21 0801  Weight: 85 kg 82.7 kg 80.5 kg    Examination:  General exam: Appears calm and comfortable  Respiratory system: scattered wheezing posteriorly. Cardiovascular system: S1 & S2 heard, RRR. No JVD, No pedal edema. Gastrointestinal system: Abdomen  is nondistended, soft and nontender.  Normal bowel sounds heard. Central nervous system: Alert and oriented. No focal neurological deficits. Extremities: Symmetric 5 x 5 power. Skin: No rashes, lesions or ulcers Psychiatry:  Mood & affect appropriate.       Data Reviewed: I have personally reviewed following labs and imaging studies  CBC: Recent Labs  Lab 01/20/21 0104 01/21/21 0127 01/22/21 0041 01/23/21 0037 01/24/21 0046  WBC 14.1* 12.4* 15.9* 16.7* 17.0*  NEUTROABS 10.5* 8.5* 11.1* 12.7* 11.6*  HGB 11.1* 11.3* 11.8* 11.8* 12.3*  HCT 35.8* 36.3* 37.4* 37.8* 39.5  MCV 72.3* 71.7* 71.5* 71.6* 71.6*  PLT 495* 486* 555* 535* 521*     Basic Metabolic Panel: Recent Labs  Lab 01/20/21 0104 01/21/21 0127 01/22/21 0041 01/23/21 1358 01/24/21 0046  NA 135 133* 134* 131* 132*  K 4.2 4.0 4.2 4.7 4.4  CL 100 101 96* 93* 92*  CO2 23 24 27 25 26   GLUCOSE 107* 174* 126* 155* 123*  BUN 46* 54* 67* 88* 94*  CREATININE 2.35* 2.41* 2.22* 2.78* 2.61*  CALCIUM 8.7* 8.3* 8.7* 8.7* 8.8*     GFR: Estimated Creatinine Clearance: 20.7 mL/min (A) (by C-G formula based on SCr of 2.61 mg/dL (H)).  Liver Function Tests: Recent Labs  Lab 01/18/21 1655  AST 19  ALT 15  ALKPHOS 88  BILITOT 0.5  PROT 6.4*  ALBUMIN 3.5     CBG: No results for input(s): GLUCAP in the last 168 hours.   Recent Results (from the past 240 hour(s))  Resp Panel by RT-PCR (Flu A&B, Covid) Nasopharyngeal Swab     Status: None   Collection Time: 01/18/21  5:00 PM   Specimen: Nasopharyngeal Swab; Nasopharyngeal(NP) swabs in vial transport medium  Result Value Ref Range Status   SARS Coronavirus 2 by RT PCR NEGATIVE NEGATIVE Final    Comment: (NOTE) SARS-CoV-2 target nucleic acids are NOT DETECTED.  The SARS-CoV-2 RNA is generally detectable in upper respiratory specimens during the acute phase of infection. The lowest concentration of SARS-CoV-2 viral copies this assay can detect is 138 copies/mL.  A negative result does not preclude SARS-Cov-2 infection and should not be used as the sole basis for treatment or other patient management decisions. A negative result may occur with  improper specimen collection/handling, submission of specimen other than nasopharyngeal swab, presence of viral mutation(s) within the areas targeted by this assay, and inadequate number of viral copies(<138 copies/mL). A negative result must be combined with clinical observations, patient history, and epidemiological information. The expected result is Negative.  Fact Sheet for Patients:  EntrepreneurPulse.com.au  Fact Sheet for Healthcare Providers:  IncredibleEmployment.be  This test is no t yet approved or cleared by the [place] FDA and  has been authorized for detection and/or diagnosis of SARS-CoV-2 by FDA under an Emergency Use Authorization (EUA). This EUA will remain  in effect (meaning this test can be used) for the duration of the COVID-19 declaration under Section 564(b)(1) of the Act, 21 U.S.C.section 360bbb-3(b)(1), unless the authorization  is terminated  or revoked sooner.       Influenza A by PCR NEGATIVE NEGATIVE Final   Influenza B by PCR NEGATIVE NEGATIVE Final    Comment: (NOTE) The Xpert Xpress SARS-CoV-2/FLU/RSV plus assay is intended as an aid in the diagnosis of influenza from Nasopharyngeal swab specimens and should not be used as a sole basis for treatment. Nasal washings and aspirates are unacceptable for Xpert Xpress SARS-CoV-2/FLU/RSV testing.  Fact Sheet for Patients: EntrepreneurPulse.com.au  Fact Sheet for Healthcare Providers: IncredibleEmployment.be  This test is not yet approved or cleared by the [place] FDA and has been authorized for detection and/or diagnosis of SARS-CoV-2 by FDA under an Emergency Use Authorization (EUA). This EUA will remain in effect (meaning this test  can be used) for the duration of the COVID-19 declaration under Section 564(b)(1) of the Act, 21 U.S.C. section 360bbb-3(b)(1), unless the authorization is terminated or revoked.  Performed at [facility], [address] [place], [place] [address]   MRSA Next Gen by PCR, Nasal     Status: None   Collection Time: 01/18/21 11:40 PM   Specimen: Nasal Mucosa; Nasal Swab  Result Value Ref Range Status   MRSA by PCR Next Gen NOT DETECTED NOT DETECTED Final    Comment: (NOTE) The GeneXpert MRSA Assay (FDA approved for NASAL specimens only), is one component of a comprehensive MRSA colonization surveillance program. It is not intended to diagnose MRSA infection nor to guide or monitor treatment for MRSA infections. Test performance is not FDA approved in patients less than 63 years old. Performed at [facility], [place] [address] [place],  [address]           Radiology Studies: No results found.      Scheduled Meds:  allopurinol  100 mg Oral Daily   amiodarone  400 mg Oral BID   apixaban  2.5 mg Oral BID   dextromethorphan-guaiFENesin  1 tablet Oral BID   feeding supplement  237 mL Oral BID BM   [START ON 01/25/2021] furosemide  40 mg Oral BID   ipratropium-albuterol  3 mL Nebulization Q4H   latanoprost  1 drop Both Eyes QHS   metoprolol succinate  25 mg Oral Daily   mometasone-formoterol  2 puff Inhalation BID   multivitamin with minerals  1 tablet Oral q morning   pantoprazole  20 mg Oral Daily   polyethylene glycol  17 g Oral Daily   predniSONE  40 mg Oral QAC breakfast   senna-docusate  2 tablet Oral BID   Continuous Infusions:   LOS: 6 days       Hosie Poisson, MD [facility]   To contact the attending provider between 7A-7P or the covering provider during after hours 7P-7A, please log into the web site [email or web address] and access using universal [place] password for that web site. If you do not have the password,  please call the hospital operator.  01/24/2021, 1:52 PM

## 2021-01-24 NOTE — Plan of Care (Signed)
°  Problem: Education: Goal: Knowledge of General Education information will improve Description: Including pain rating scale, medication(s)/side effects and non-pharmacologic comfort measures 01/24/2021 1207 by Thressa Sheller, RN Outcome: Progressing 01/24/2021 1206 by Thressa Sheller, RN Outcome: Progressing   Problem: Health Behavior/Discharge Planning: Goal: Ability to manage health-related needs will improve 01/24/2021 1207 by Thressa Sheller, RN Outcome: Progressing 01/24/2021 1206 by Thressa Sheller, RN Outcome: Progressing   Problem: Clinical Measurements: Goal: Ability to maintain clinical measurements within normal limits will improve Outcome: Progressing Goal: Will remain free from infection Outcome: Progressing Goal: Respiratory complications will improve 01/24/2021 1207 by Thressa Sheller, RN Outcome: Progressing 01/24/2021 1206 by Thressa Sheller, RN Outcome: Progressing   Problem: Activity: Goal: Risk for activity intolerance will decrease Outcome: Progressing   Problem: Nutrition: Goal: Adequate nutrition will be maintained Outcome: Progressing   Problem: Safety: Goal: Ability to remain free from injury will improve Outcome: Progressing   Problem: Skin Integrity: Goal: Risk for impaired skin integrity will decrease Outcome: Progressing

## 2021-01-24 NOTE — Plan of Care (Signed)

## 2021-01-24 NOTE — Plan of Care (Signed)
°  Problem: Education: Goal: Knowledge of General Education information will improve Description: Including pain rating scale, medication(s)/side effects and non-pharmacologic comfort measures Outcome: Progressing   Problem: Health Behavior/Discharge Planning: Goal: Ability to manage health-related needs will improve Outcome: Progressing   Problem: Clinical Measurements: Goal: Ability to maintain clinical measurements within normal limits will improve Outcome: Progressing Goal: Will remain free from infection Outcome: Progressing Goal: Respiratory complications will improve Outcome: Progressing   Problem: Activity: Goal: Risk for activity intolerance will decrease Outcome: Progressing   Problem: Nutrition: Goal: Adequate nutrition will be maintained Outcome: Progressing   Problem: Skin Integrity: Goal: Risk for impaired skin integrity will decrease Outcome: Progressing

## 2021-01-24 NOTE — Progress Notes (Signed)
Physical Therapy Treatment Patient Details Name: Nicholas [name] MRN: [id number] DOB: 1933/01/12 Today's Date: 01/24/2021   History of Present Illness Pt is an 88 y.o. male admitted 01/18/21 with SOB, wheezing; pt reports testing (+) influenza B on 1/9, but tested (-) on admission. Workup for COPD exacerbation, CHF, potential influenza B. PMH includes CHF, PAF, CKD; of note, recent admission 12/24/20 s/p cardioversion for afib.    PT Comments    Pt agreeable to OOB activity.  Emphasis on warm up standing exercises with coaching for efficient breathing, sit to stand safety, progression of gait stability, stamina with balance activity.    Recommendations for follow up therapy are one component of a multi-disciplinary discharge planning process, led by the attending physician.  Recommendations may be updated based on patient status, additional functional criteria and insurance authorization.  Follow Up Recommendations  No PT follow up     Assistance Recommended at Discharge Intermittent Supervision/Assistance  Patient can return home with the following Assistance with cooking/housework;Assist for transportation;A little help with bathing/dressing/bathroom;Direct supervision/assist for financial management;Direct supervision/assist for medications management   Equipment Recommendations  None recommended by PT    Recommendations for Other Services       Precautions / Restrictions Precautions Precautions: Fall Precaution Comments: Watch RR/SpO2 (does not wear O2 baseline) - significant wheezing     Mobility  Bed Mobility Overal bed mobility: Needs Assistance Bed Mobility: Supine to Sit     Supine to sit: Supervision, Modified independent (Device/Increase time)     General bed mobility comments: used some momentum to come up onto R elbow and press up.    Transfers Overall transfer level: Needs assistance Equipment used: None Transfers: Sit to/from Stand Sit to Stand: Min  guard           General transfer comment: powered up well    Ambulation/Gait Ambulation/Gait assistance: Min guard Gait Distance (Feet): 130 Feet (x2 with 1 standing rest break) Assistive device: Rolling walker (2 wheels) Gait Pattern/deviations: Step-through pattern, Decreased stride length   Gait velocity interpretation: <1.8 ft/sec, indicate of risk for recurrent falls   General Gait Details: slower generally steady gait with RW, minimal use of the RW   Stairs             Wheelchair Mobility    Modified Rankin (Stroke Patients Only)       Balance     Sitting balance-Leahy Scale: Good       Standing balance-Leahy Scale: Fair                              Cognition Arousal/Alertness: Awake/alert Behavior During Therapy: WFL for tasks assessed/performed Overall Cognitive Status: Within Functional Limits for tasks assessed                                          Exercises General Exercises - Lower Extremity Hip Flexion/Marching: AROM, Strengthening, Both, 10 reps, Standing Mini-Sqauts: AROM, Strengthening, 10 reps, Standing    General Comments        Pertinent Vitals/Pain Pain Assessment Pain Assessment: Faces Faces Pain Scale: No hurt Pain Intervention(s): Monitored during session    Home Living                          Prior Function  PT Goals (current goals can now be found in the care plan section) Acute Rehab PT Goals Patient Stated Goal: return home PT Goal Formulation: With patient Time For Goal Achievement: 02/02/21 Potential to Achieve Goals: Good Progress towards PT goals: Progressing toward goals    Frequency    Min 3X/week      PT Plan Current plan remains appropriate    Co-evaluation              AM-PAC PT "6 Clicks" Mobility   Outcome Measure  Help needed turning from your back to your side while in a flat bed without using bedrails?: None Help needed  moving from lying on your back to sitting on the side of a flat bed without using bedrails?: A Little Help needed moving to and from a bed to a chair (including a wheelchair)?: A Little Help needed standing up from a chair using your arms (e.g., wheelchair or bedside chair)?: A Little Help needed to walk in hospital room?: A Little Help needed climbing 3-5 steps with a railing? : A Little 6 Click Score: 19    End of Session Equipment Utilized During Treatment: Oxygen Activity Tolerance: Patient tolerated treatment well Patient left: in bed;with call bell/phone within reach Nurse Communication: Mobility status PT Visit Diagnosis: Other abnormalities of gait and mobility (R26.89)     Time: 1030-1314 PT Time Calculation (min) (ACUTE ONLY): 19 min  Charges:  $Gait Training: 8-22 mins                     01/24/2021  Ginger Carne., PT Acute Rehabilitation Services [phone]  (pager) [phone]  (office)   Tessie Fass Shalena Ezzell 01/24/2021, 5:31 PM

## 2021-01-25 LAB — CBC WITH DIFFERENTIAL/PLATELET
Abs Immature Granulocytes: 0 10*3/uL (ref 0.00–0.07)
Basophils Absolute: 0 10*3/uL (ref 0.0–0.1)
Basophils Relative: 0 %
Eosinophils Absolute: 0 10*3/uL (ref 0.0–0.5)
Eosinophils Relative: 0 %
HCT: 37.8 % — ABNORMAL LOW (ref 39.0–52.0)
Hemoglobin: 11.9 g/dL — ABNORMAL LOW (ref 13.0–17.0)
Lymphocytes Relative: 30 %
Lymphs Abs: 6.7 10*3/uL — ABNORMAL HIGH (ref 0.7–4.0)
MCH: 22.5 pg — ABNORMAL LOW (ref 26.0–34.0)
MCHC: 31.5 g/dL (ref 30.0–36.0)
MCV: 71.3 fL — ABNORMAL LOW (ref 80.0–100.0)
Monocytes Absolute: 2 10*3/uL — ABNORMAL HIGH (ref 0.1–1.0)
Monocytes Relative: 9 %
Neutro Abs: 13.5 10*3/uL — ABNORMAL HIGH (ref 1.7–7.7)
Neutrophils Relative %: 61 %
Platelets: 485 10*3/uL — ABNORMAL HIGH (ref 150–400)
RBC: 5.3 MIL/uL (ref 4.22–5.81)
RDW: 20.6 % — ABNORMAL HIGH (ref 11.5–15.5)
WBC: 22.2 10*3/uL — ABNORMAL HIGH (ref 4.0–10.5)
nRBC: 0 % (ref 0.0–0.2)

## 2021-01-25 LAB — MAGNESIUM: Magnesium: 2.5 mg/dL — ABNORMAL HIGH (ref 1.7–2.4)

## 2021-01-25 LAB — BASIC METABOLIC PANEL
Anion gap: 10 (ref 5–15)
BUN: 107 mg/dL — ABNORMAL HIGH (ref 8–23)
CO2: 29 mmol/L (ref 22–32)
Calcium: 8.5 mg/dL — ABNORMAL LOW (ref 8.9–10.3)
Chloride: 92 mmol/L — ABNORMAL LOW (ref 98–111)
Creatinine, Ser: 2.73 mg/dL — ABNORMAL HIGH (ref 0.61–1.24)
GFR, Estimated: 22 mL/min — ABNORMAL LOW (ref >60)
Glucose, Bld: 123 mg/dL — ABNORMAL HIGH (ref 70–99)
Potassium: 4.5 mmol/L (ref 3.5–5.1)
Sodium: 131 mmol/L — ABNORMAL LOW (ref 135–145)

## 2021-01-25 LAB — PROCALCITONIN: Procalcitonin: <0.1 ng/mL

## 2021-01-25 MED ORDER — IPRATROPIUM-ALBUTEROL 0.5-2.5 (3) MG/3ML IN SOLN
3.0000 mL | RESPIRATORY_TRACT | Status: DC | PRN
  Administered 2021-01-25 – 2021-01-27 (×3): 3 mL via RESPIRATORY_TRACT
  Filled 2021-01-25 (×2): qty 3

## 2021-01-25 MED ORDER — ENSURE ENLIVE PO LIQD
237.0000 mL | Freq: Three times a day (TID) | ORAL | Status: DC
  Administered 2021-01-25 – 2021-01-28 (×7): 237 mL via ORAL

## 2021-01-25 NOTE — Progress Notes (Signed)
Daily Progress Note   Patient Name: Nicholas [name]       Date: 01/25/2021 DOB: June 25, 1933  Age: 88 y.o. MRN#: [id number] Attending Physician: Hosie Poisson, MD Primary Care Physician: Street, [name], MD Admit Date: 01/18/2021  Reason for Consultation/Follow-up: Establishing goals of care  Patient Profile/HPI:   88 y.o. male  with past medical history of COPD, CHF, a fib s/p cardioversion, CKD admitted on 01/18/2021 with wheezing, SOB, positive flu prior to admission, but negative in ED, COPD exacerbation. Palliative consulted for [place].    Subjective: Respiratory status slowly improving. Cardioversion scheduled for Thursday. Labs reviewed- Cr stable, hyponatremic. WBC elevated- likely d/t steroids, PCT pending. Serial chest xrays reviewed.  He is ambulating with PT.  He feels well. Grandson who he lives with is at bedside.  Called daughter- [name]- she is in agreement with current plan, including DCCV. Continues with same [place]- treat what is treatable with goal of returning home where he lives with his Grandson. Would not want life prolonging measures if he was going to be dependent for care.    Review of Systems  Unable to perform ROS: Other - very HOH   Physical Exam Vitals and nursing note reviewed.            Vital Signs: BP 116/80 (BP Location: Right Arm)    Pulse 90    Temp 97.9 F (36.6 C) (Oral)    Resp 20    Ht 5\' 9"  (1.753 m)    Wt 80.1 kg    SpO2 93%    BMI 26.08 kg/m  SpO2: SpO2: 93 % O2 Device: O2 Device: Nasal Cannula O2 Flow Rate: O2 Flow Rate (L/min): 2 L/min  Intake/output summary:  Intake/Output Summary (Last 24 hours) at 01/25/2021 1116 Last data filed at 01/25/2021 0900 Gross per 24 hour  Intake 540 ml  Output 1150 ml  Net -610 ml   LBM: Last BM Date:  01/24/21 Baseline Weight: Weight: 85 kg Most recent weight: Weight: 80.1 kg       Palliative Assessment/Data: PPS: 50%      Patient Active Problem List   Diagnosis Date Noted   Influenza B 01/19/2021   COPD exacerbation ([place]) 01/18/2021   Malnutrition of moderate degree 12/25/2020   Atrial flutter with rapid ventricular response ([place]) 12/25/2020   Chronic  combined systolic and diastolic CHF (congestive heart failure) ([place]) 12/24/2020   A-fib ([place]) 12/24/2020   Iron deficiency anemia 11/04/2020   Anemia 09/22/2020   Arthritis 09/22/2020   Asthma 09/22/2020   Family history of adverse reaction to anesthesia 09/22/2020   Glaucoma 09/22/2020   Pneumonia due to COVID-19 virus 07/19/2018   COVID-19 virus infection 07/18/2018   Stage 3b chronic kidney disease (CKD) ([place]) 11/27/2017   Hyperlipidemia 11/02/2017   TIA (transient ischemic attack) 11/01/2017   CHF (congestive heart failure) ([place]) 11/01/2017   HTN (hypertension) 11/01/2017    Palliative Care Assessment & Plan    Assessment/Recommendations/Plan  COPD/CHF exacerbation in the setting of positive influenza- plan for continued treatment with goal of returning home A fib- plan for DCCV on 1/19 [place]- DNR, cont to treat what is treatable with goal of returning home- if he had catastrophic event that were to leave him disabled, then would choose to deescalate care He has been referred for Palliative outpatient and [facility] [place] has accepted him on their service for after discharge   Code Status: DNR  Prognosis:  Unable to determine  Discharge Planning: Home with Palliative Services  Care plan was discussed with patient's daughter- [name].   Thank you for allowing the Palliative Medicine Team to assist in the care of this patient.  Mariana Kaufman, AGNP-C Palliative Medicine   Please contact Palliative Medicine Team phone at [phone] for questions and concerns.

## 2021-01-25 NOTE — Plan of Care (Signed)
°  Problem: Education: Goal: Knowledge of General Education information will improve Description: Including pain rating scale, medication(s)/side effects and non-pharmacologic comfort measures Outcome: Progressing   Problem: Health Behavior/Discharge Planning: Goal: Ability to manage health-related needs will improve Outcome: Progressing   Problem: Clinical Measurements: Goal: Ability to maintain clinical measurements within normal limits will improve Outcome: Progressing Goal: Diagnostic test results will improve Outcome: Progressing Goal: Respiratory complications will improve Outcome: Progressing   Problem: Nutrition: Goal: Adequate nutrition will be maintained Outcome: Progressing   Problem: Skin Integrity: Goal: Risk for impaired skin integrity will decrease Outcome: Progressing

## 2021-01-25 NOTE — Progress Notes (Addendum)
Advanced Heart Failure Rounding Note  PCP-Cardiologist: Shirlee More, MD   Subjective:   Admit weight 187---> 176 pounds.   Diuretics held yesterday. Wt down 1 lb.   Scr 2.78>>2.61>>2.73   BUN 88>>94>>107   K 4.5  Na 131   WBC 16>>17>>22K. AF. Remains on prednisone. CXR yesterday w/ RLL atelectasis and small effusion. Appearance similar to prior.   Sitting up in bed. Awake and alert. No confusion. Remains on supp O2, 3L/min. Desats on RA. Reports productive cough but no subjective fever/chills.   Son present at bedside.    Objective:   Weight Range: 80.1 kg Body mass index is 26.08 kg/m.   Vital Signs:   Temp:  [97.1 F (36.2 C)-98 F (36.7 C)] 97.6 F (36.4 C) (01/17 0810) Pulse Rate:  [92-100] 99 (01/17 0810) Resp:  [15-20] 19 (01/17 0810) BP: (94-115)/(67-83) 115/83 (01/17 0810) SpO2:  [91 %-95 %] 94 % (01/17 0810) Weight:  [80.1 kg] 80.1 kg (01/17 0412) Last BM Date: 01/24/21  Weight change: Filed Weights   01/21/21 1509 01/24/21 0801 01/25/21 0412  Weight: 82.7 kg 80.5 kg 80.1 kg    Intake/Output:   Intake/Output Summary (Last 24 hours) at 01/25/2021 0826 Last data filed at 01/25/2021 0600 Gross per 24 hour  Intake 840 ml  Output 1400 ml  Net -560 ml      Physical Exam    General:  elderly male, sitting up in bed. No respiratory difficulty HEENT: normal Neck: supple. no JVD. Carotids 2+ bilat; no bruits. No lymphadenopathy or thyromegaly appreciated. Cor: PMI nondisplaced. Regular rhythm tachy rate. No rubs, gallops or murmurs. Lungs: bibasilar crackles L>R, bilateral diffuse rhonchi Abdomen: soft, nontender, nondistended. No hepatosplenomegaly. No bruits or masses. Good bowel sounds. Extremities: no cyanosis, clubbing, rash, edema Neuro: alert & oriented x 3, cranial nerves grossly intact. moves all 4 extremities w/o difficulty. Affect pleasant.   Telemetry  ST w/ PACs low 100s personally reviewed.   EKG    N/A   Labs     CBC Recent Labs    01/24/21 0046 01/25/21 0033  WBC 17.0* 22.2*  NEUTROABS 11.6* 13.5*  HGB 12.3* 11.9*  HCT 39.5 37.8*  MCV 71.6* 71.3*  PLT 521* 027*   Basic Metabolic Panel Recent Labs    01/24/21 0046 01/25/21 0033  NA 132* 131*  K 4.4 4.5  CL 92* 92*  CO2 26 29  GLUCOSE 123* 123*  BUN 94* 107*  CREATININE 2.61* 2.73*  CALCIUM 8.8* 8.5*  MG  --  2.5*   Liver Function Tests No results for input(s): AST, ALT, ALKPHOS, BILITOT, PROT, ALBUMIN in the last 72 hours. No results for input(s): LIPASE, AMYLASE in the last 72 hours. Cardiac Enzymes No results for input(s): CKTOTAL, CKMB, CKMBINDEX, TROPONINI in the last 72 hours.  BNP: BNP (last 3 results) Recent Labs    12/24/20 0941 12/26/20 0404 01/18/21 1655  BNP 1,084.1* 1,213.1* 2,417.5*    ProBNP (last 3 results) Recent Labs    10/15/20 0841 10/27/20 0936 12/15/20 1127  PROBNP 5,768* 6,081* 18,617*     D-Dimer No results for input(s): DDIMER in the last 72 hours. Hemoglobin A1C No results for input(s): HGBA1C in the last 72 hours. Fasting Lipid Panel No results for input(s): CHOL, HDL, LDLCALC, TRIG, CHOLHDL, LDLDIRECT in the last 72 hours. Thyroid Function Tests No results for input(s): TSH, T4TOTAL, T3FREE, THYROIDAB in the last 72 hours.  Invalid input(s): FREET3  Other results:   Imaging  DG CHEST PORT 1 VIEW  Result Date: 01/24/2021 CLINICAL DATA:  Follow-up exam.  Shortness of breath. EXAM: PORTABLE CHEST 1 VIEW COMPARISON:  01/18/2021 FINDINGS: Heart is markedly enlarged, similar to the prior study. There is probable small RIGHT-sided pleural effusion and subsegmental atelectasis in the RIGHT LOWER lobe. Otherwise lungs are clear. No pulmonary edema. Note is made of atherosclerosis of the thoracic aorta. Prior RIGHT shoulder arthroplasty. There is superior subluxation of the humeral head component. Appearance is similar to prior. IMPRESSION: Stable cardiomegaly. Similar RIGHT LOWER  lobe atelectasis and probable small effusion. Electronically Signed   By: Nolon Nations M.D.   On: 01/24/2021 13:52     Medications:     Scheduled Medications:  allopurinol  100 mg Oral Daily   amiodarone  400 mg Oral BID   apixaban  2.5 mg Oral BID   dextromethorphan-guaiFENesin  1 tablet Oral BID   feeding supplement  237 mL Oral BID BM   furosemide  40 mg Oral BID   ipratropium-albuterol  3 mL Nebulization TID   latanoprost  1 drop Both Eyes QHS   metoprolol succinate  25 mg Oral Daily   mometasone-formoterol  2 puff Inhalation BID   multivitamin with minerals  1 tablet Oral q morning   pantoprazole  20 mg Oral Daily   polyethylene glycol  17 g Oral Daily   predniSONE  40 mg Oral QAC breakfast   senna-docusate  2 tablet Oral BID    Infusions:   PRN Medications: acetaminophen, guaiFENesin-dextromethorphan    Patient Profile   [name] is a 88 year old with history of CKD Stage IIIB, A flutter, HFmEF, HTN, TIA, IDA, and gout. He has not had an ischemic work up.     Admitted with acute hypoxic respiratory failure, AECOPD + A/C HF.   Assessment/Plan  A/C HFmEF -NICM suspect tachy-mediated but he has not had an ischemic work up. Could consider myoview.  - Echo 01/20/21 25-30%---> 12/2020 EF 40-45%  - Diuresed with IV lasix. Overall weight down 10 pounds. Hold IV diuretics today  --BB-Continue Toprol XL 25 mg daily  -Ace/ARB/ARNI- no elevated creatinine -MRA-- no elevated creatinine  -SGLT2i- Could consider jardiance but with CKD Stage IV hold off.  - Renal function stable.    2. PAF -S/P DC-CV 12/2020 --> NSR - Went back in A fib and had another DC_CV 01/15/20--> SR - Went back in A fib 1/13.  ? ST w/ PACs today  - Continue amio 400 mg twice a day + Toprol XL 25 mg daily. - Continue eliquis 2.5 mg twice a day.  ? Watchman would be a consideration.  - Will need to consider another cardioversion. Would like for him to recover from this exacerbation. From HF  perspective he appears stable.    3. Acute Hypoxic Respiratory Failure in the setting of AECOPD - Given steroids, doxycycline + tamiflu at Urgent Care on 1/9  - On admit started on steroid taper and bronchodilators with improvement.  - Now down to 3 liters with stable O2 sats. He is not on oxygen at home. - ambulate to check home O2 needs - w/ increasing WBC and productive cough, will check PCT    4. HTN  Stable.    5. CKD Stage IV -Creatinine baseline 2.4-2.6 -Todays creatinine trending up 2.73 - BUN up to 107. Hold diuretics.  - Followed by Dr Justin Mend   6. IDA -He was seen by Hematology 11/04/20 for anemia.  -Iron Sats 4%. Had  Iron transfusions x2 - 1/15 Iron sats 11%.  - Hgb stable 11.9   - Recommendations for GI follow up.    7. DNR  -Palliative Care following.  -He was referred to outpatient Palliative    Length of Stay: [address], [place]  01/25/2021, 8:26 AM  Advanced Heart Failure Team Pager [phone] (M-F; 7a - 5p)  Please contact [facility] for night-coverage after hours (5p -7a ) and weekends on [email or web address]  Patient seen and examined with the above-signed Advanced Practice Provider and/or Housestaff. I personally reviewed laboratory data, imaging studies and relevant notes. I independently examined the patient and formulated the important aspects of the plan. I have edited the note to reflect any of my changes or salient points. I have personally discussed the plan with the patient and/or family.  Remains in atrial tach with mild RVR. Breathing feels better but still with some wheezing.   WBC elevated in setting of steroids. PCT pending   General:  elderly. Mild wheeze  HEENT: normal Neck: supple. JVP 8-9 Carotids 2+ bilat; no bruits. No lymphadenopathy or thryomegaly appreciated. Cor: PMI nondisplaced. Irregular rate & rhythm. No rubs, gallops or murmurs. Lungs: mild wheezing  Abdomen: soft, nontender, nondistended. No hepatosplenomegaly. No bruits  or masses. Good bowel sounds. Extremities: no cyanosis, clubbing, rash, edema Neuro: alert & orientedx3, cranial nerves grossly intact. moves all 4 extremities w/o difficulty. Affect pleasant    Remains in atrial tach/flutter. Continue amio and Eliquis. Will plan DC-CV at 130p on Thursday if resp status stable. Volume status ok. Continue to hold diuretics for now.   Glori Bickers, MD  9:42 AM

## 2021-01-25 NOTE — Progress Notes (Signed)
Mobility Specialist Progress Note    01/25/21 1713  Mobility  Activity Contraindicated/medical hold   RN advised to hold off as she wants to monitor pt rhythm.   [facility] Mobility Specialist  M.S. 2C and 6E: [phone] M.S. 4E: [phone]

## 2021-01-25 NOTE — Plan of Care (Signed)

## 2021-01-25 NOTE — Progress Notes (Signed)
Nutrition Follow-up  DOCUMENTATION CODES:   Non-severe (moderate) malnutrition in context of chronic illness  INTERVENTION:   - Increase Ensure Enlive po to TID, each supplement provides 350 kcal and 20 grams of protein  - Continue liberalized Regular diet order   - Continue MVI with minerals daily  - Encourage PO intake  NUTRITION DIAGNOSIS:   Moderate Malnutrition related to chronic illness (CHF, CKD, COPD) as evidenced by mild fat depletion, moderate muscle depletion.  Ongoing, being addressed via liberalized diet and oral nutrition supplements  GOAL:   Patient will meet greater than or equal to 90% of their needs  Progressing  MONITOR:   PO intake, Supplement acceptance, Labs, Weight trends, I & O's  REASON FOR ASSESSMENT:   Consult COPD Protocol  ASSESSMENT:   88 year old male who presented to the ED on 1/10 with SOB. PMH of CHF, PAF, CKD stage IIIb, HTN, recent cardioversion for atrial fibrillation 5 days ago. Pt admitted with COPD exacerbation.  Noted plan for DC-CV on Thursday.  Spoke with pt and grandson at bedside. Pt reports appetite is normal and that he is eating what he wants from his meal trays. Pt states that he does not finish his meal trays but that this is typical for him. Pt reports consuming Ensure oral nutrition supplements and grandson confirms. Will increase frequency of Ensure from BID to TID. Pt and family in agreement with plan.  Admit weight: 85 kg Current weight: 80.1 kg  Pt with an almost 5 kg weight loss since admission but is net negative 9.3 L. Suspect weight loss related to negative fluid balance and diuresis.  Meal Completion: 30-80% x last 8 meals  Medications reviewed and include: Ensure Enlive BID, lasix, MVI with minerals, protonix, miralax, prednisone, senna  Labs reviewed: sodium 131, BUN 107, creatinine 2.73, magnesium 2.5, WBC 22.2  UOP: 1400 ml x 24 hours I/O's: -9.3 L since admit  Diet Order:   Diet Order              Diet regular Room service appropriate? Yes; Fluid consistency: Thin  Diet effective now                   EDUCATION NEEDS:   Education needs have been addressed  Skin:  Skin Assessment: Reviewed RN Assessment  Last BM:  01/24/21  Height:   Ht Readings from Last 1 Encounters:  01/18/21 5\' 9"  (1.753 m)    Weight:   Wt Readings from Last 1 Encounters:  01/25/21 80.1 kg    BMI:  Body mass index is 26.08 kg/m.  Estimated Nutritional Needs:   Kcal:  1800-2000  Protein:  90-110 grams  Fluid:  1.8-2.0 L    Gustavus Bryant, MS, RD, LDN Inpatient Clinical Dietitian Please see AMiON for contact information.

## 2021-01-25 NOTE — Progress Notes (Signed)
PROGRESS NOTE    Nicholas [name]  ZCH:[id number] DOB: 1933-09-17 DOA: 01/18/2021 PCP: Street, [name], MD  Chief Complaint  Patient presents with   Shortness of Breath    Brief Narrative:   Nicholas [name] is a 88 y.o. male with medical history significant of chronic combined CHF, PAF, CKD 3b s/p cardioversion for A.Fib at hospital x5 days ago. Since discharge pt developed wheezing, SOB, over the past week.  Symptoms persistent and worsening. Went to [facility] 1/9, tested positive for influenza B per report .Got steroids, doxycycline, and antiviral Rx. Pt presents with worsening sob. He was admitted for acute respiratory failure with hypoxia from acute copd exacerbation. Hospital course complicated by recurrent atrial fibrillation and mild CHF.   Assessment & Plan:   Principal Problem:   COPD exacerbation ([place]) Active Problems:   HTN (hypertension)   Stage 3b chronic kidney disease (CKD) (HCC)   Chronic combined systolic and diastolic CHF (congestive heart failure) (HCC)   A-fib (HCC)   Influenza B   Acute respiratory failure with hypoxia sec to acute copd exacerbation and acute on chronic combined systolic and diastolic CHF.  Pt continues to improve.  Still requiring 2 lit of [place] oxygen, wheezing has improved with duonebs. Transitioned to oral steroids for a quick taper over the next few days. No changes in meds.  Started on IV lasix 60 mg BID, transitioned to oral lasix . Monitor renal parameters.  Echocardiogram showed LVEF of estimation, is 25 to 30%. The left ventricle has severely decreased function. The left ventricle demonstrates global hypokinesis. The left ventricular internal cavity size was mildly dilated. Left ventricular diastolic parameters are consistent with Grade I diastolic dysfunction (impaired relaxation). This is new compared tot he echo from 10/22.  Cardiology consulted and recommendations given.  Continue with strict intake and output, monitor daily weights and  renal parameters while on lasix.  Potassium wnl.  Therapy evaluations recommending [place] refusing home health care.  Probably    Mild Aki on Stage 3 b CKD: Probably from IV diuresis, continue to monitor creatinine.   Mild hyponatremia.  From diuresis. Monitor.    Paroxysmal Atrial fibrillation:  S/p cardioversion last week. Patient is back in A. fib this admission cardiology consulted and plan for cardioversion next Thursday On eliquis for anticoagulation, and metoprolol for rate control.    Influenza:  Completed the course of tamiflu.   Hypertension:  BP parameters are well controlled  Leukocytosis:  From steroids.    Diarrhea possibly from antibiotics.  Appears to have resolved.    Mild microcytic anemia:  Get anemia panel.  Low iron levels on panel Recommend iron supplementation on discharge   Thrombocytosis:  Probably reactive.   DVT prophylaxis: on eliquis.  Code Status: DNR.  Family Communication: Family at bedside Disposition:   Status is: Inpatient  Remains inpatient appropriate because: Cardioversion next Thursday       Consultants:  Cardiology.   Procedures: Echocardiogram.   Antimicrobials:  Antibiotics Given (last 72 hours)     None         Subjective: Patient denies any complaints Objective: Vitals:   01/25/21 0412 01/25/21 0720 01/25/21 0810 01/25/21 1053  BP:   115/83 116/80  Pulse:   99 90  Resp:   19 20  Temp:   97.6 F (36.4 C) 97.9 F (36.6 C)  TempSrc:   Oral Oral  SpO2:  92% 94% 93%  Weight: 80.1 kg     Height:  Intake/Output Summary (Last 24 hours) at 01/25/2021 1147 Last data filed at 01/25/2021 0900 Gross per 24 hour  Intake 540 ml  Output 1150 ml  Net -610 ml    Filed Weights   01/21/21 1509 01/24/21 0801 01/25/21 0412  Weight: 82.7 kg 80.5 kg 80.1 kg    Examination:  General exam: Elderly gentleman not in any kind of distress Respiratory system: Air entry fair, 2 L of  oxygen, scattered wheezing posteriorly Cardiovascular system: S1 & S2 heard, RRR. No JVD, . No pedal edema. Gastrointestinal system: Abdomen is nondistended, soft and nontender.  Normal bowel sounds heard. Central nervous system: Alert and oriented. No focal neurological deficits. Extremities: Symmetric 5 x 5 power. Skin: No rashes Psychiatry: Flat affect       Data Reviewed: I have personally reviewed following labs and imaging studies  CBC: Recent Labs  Lab 01/21/21 0127 01/22/21 0041 01/23/21 0037 01/24/21 0046 01/25/21 0033  WBC 12.4* 15.9* 16.7* 17.0* 22.2*  NEUTROABS 8.5* 11.1* 12.7* 11.6* 13.5*  HGB 11.3* 11.8* 11.8* 12.3* 11.9*  HCT 36.3* 37.4* 37.8* 39.5 37.8*  MCV 71.7* 71.5* 71.6* 71.6* 71.3*  PLT 486* 555* 535* 521* 485*     Basic Metabolic Panel: Recent Labs  Lab 01/21/21 0127 01/22/21 0041 01/23/21 1358 01/24/21 0046 01/25/21 0033  NA 133* 134* 131* 132* 131*  K 4.0 4.2 4.7 4.4 4.5  CL 101 96* 93* 92* 92*  CO2 24 27 25 26 29   GLUCOSE 174* 126* 155* 123* 123*  BUN 54* 67* 88* 94* 107*  CREATININE 2.41* 2.22* 2.78* 2.61* 2.73*  CALCIUM 8.3* 8.7* 8.7* 8.8* 8.5*  MG  --   --   --   --  2.5*     GFR: Estimated Creatinine Clearance: 19.8 mL/min (A) (by C-G formula based on SCr of 2.73 mg/dL (H)).  Liver Function Tests: Recent Labs  Lab 01/18/21 1655  AST 19  ALT 15  ALKPHOS 88  BILITOT 0.5  PROT 6.4*  ALBUMIN 3.5     CBG: No results for input(s): GLUCAP in the last 168 hours.   Recent Results (from the past 240 hour(s))  Resp Panel by RT-PCR (Flu A&B, Covid) Nasopharyngeal Swab     Status: None   Collection Time: 01/18/21  5:00 PM   Specimen: Nasopharyngeal Swab; Nasopharyngeal(NP) swabs in vial transport medium  Result Value Ref Range Status   SARS Coronavirus 2 by RT PCR NEGATIVE NEGATIVE Final    Comment: (NOTE) SARS-CoV-2 target nucleic acids are NOT DETECTED.  The SARS-CoV-2 RNA is generally detectable in upper  respiratory specimens during the acute phase of infection. The lowest concentration of SARS-CoV-2 viral copies this assay can detect is 138 copies/mL. A negative result does not preclude SARS-Cov-2 infection and should not be used as the sole basis for treatment or other patient management decisions. A negative result may occur with  improper specimen collection/handling, submission of specimen other than nasopharyngeal swab, presence of viral mutation(s) within the areas targeted by this assay, and inadequate number of viral copies(<138 copies/mL). A negative result must be combined with clinical observations, patient history, and epidemiological information. The expected result is Negative.  Fact Sheet for Patients:  EntrepreneurPulse.com.au  Fact Sheet for Healthcare Providers:  IncredibleEmployment.be  This test is no t yet approved or cleared by the [place] FDA and  has been authorized for detection and/or diagnosis of SARS-CoV-2 by FDA under an Emergency Use Authorization (EUA). This EUA will remain  in effect (meaning this test  can be used) for the duration of the COVID-19 declaration under Section 564(b)(1) of the Act, 21 U.S.C.section 360bbb-3(b)(1), unless the authorization is terminated  or revoked sooner.       Influenza A by PCR NEGATIVE NEGATIVE Final   Influenza B by PCR NEGATIVE NEGATIVE Final    Comment: (NOTE) The Xpert Xpress SARS-CoV-2/FLU/RSV plus assay is intended as an aid in the diagnosis of influenza from Nasopharyngeal swab specimens and should not be used as a sole basis for treatment. Nasal washings and aspirates are unacceptable for Xpert Xpress SARS-CoV-2/FLU/RSV testing.  Fact Sheet for Patients: EntrepreneurPulse.com.au  Fact Sheet for Healthcare Providers: IncredibleEmployment.be  This test is not yet approved or cleared by the [place] FDA and has been  authorized for detection and/or diagnosis of SARS-CoV-2 by FDA under an Emergency Use Authorization (EUA). This EUA will remain in effect (meaning this test can be used) for the duration of the COVID-19 declaration under Section 564(b)(1) of the Act, 21 U.S.C. section 360bbb-3(b)(1), unless the authorization is terminated or revoked.  Performed at [facility], [address], [place], [place] [address]   MRSA Next Gen by PCR, Nasal     Status: None   Collection Time: 01/18/21 11:40 PM   Specimen: Nasal Mucosa; Nasal Swab  Result Value Ref Range Status   MRSA by PCR Next Gen NOT DETECTED NOT DETECTED Final    Comment: (NOTE) The GeneXpert MRSA Assay (FDA approved for NASAL specimens only), is one component of a comprehensive MRSA colonization surveillance program. It is not intended to diagnose MRSA infection nor to guide or monitor treatment for MRSA infections. Test performance is not FDA approved in patients less than 56 years old. Performed at [facility], [place] [address] [place], [place] [address]           Radiology Studies: DG CHEST PORT 1 VIEW  Result Date: 01/24/2021 CLINICAL DATA:  Follow-up exam.  Shortness of breath. EXAM: PORTABLE CHEST 1 VIEW COMPARISON:  01/18/2021 FINDINGS: Heart is markedly enlarged, similar to the prior study. There is probable small RIGHT-sided pleural effusion and subsegmental atelectasis in the RIGHT LOWER lobe. Otherwise lungs are clear. No pulmonary edema. Note is made of atherosclerosis of the thoracic aorta. Prior RIGHT shoulder arthroplasty. There is superior subluxation of the humeral head component. Appearance is similar to prior. IMPRESSION: Stable cardiomegaly. Similar RIGHT LOWER lobe atelectasis and probable small effusion. Electronically Signed   By: Nolon Nations M.D.   On: 01/24/2021 13:52        Scheduled Meds:  allopurinol  100 mg Oral Daily   amiodarone  400 mg Oral BID   apixaban  2.5 mg  Oral BID   dextromethorphan-guaiFENesin  1 tablet Oral BID   feeding supplement  237 mL Oral TID BM   furosemide  40 mg Oral BID   ipratropium-albuterol  3 mL Nebulization TID   latanoprost  1 drop Both Eyes QHS   metoprolol succinate  25 mg Oral Daily   mometasone-formoterol  2 puff Inhalation BID   multivitamin with minerals  1 tablet Oral q morning   pantoprazole  20 mg Oral Daily   polyethylene glycol  17 g Oral Daily   predniSONE  40 mg Oral QAC breakfast   senna-docusate  2 tablet Oral BID   Continuous Infusions:   LOS: 7 days       Hosie Poisson, MD [facility]   To contact the attending provider between 7A-7P or the covering provider  during after hours 7P-7A, please log into the web site [email or web address] and access using universal [place] password for that web site. If you do not have the password, please call the hospital operator.  01/25/2021, 11:47 AM

## 2021-01-26 ENCOUNTER — Inpatient Hospital Stay (HOSPITAL_COMMUNITY): Payer: PPO

## 2021-01-26 DIAGNOSIS — N189 Chronic kidney disease, unspecified: Secondary | ICD-10-CM | POA: Insufficient documentation

## 2021-01-26 DIAGNOSIS — I5041 Acute combined systolic (congestive) and diastolic (congestive) heart failure: Secondary | ICD-10-CM

## 2021-01-26 LAB — BASIC METABOLIC PANEL
Anion gap: 12 (ref 5–15)
BUN: 99 mg/dL — ABNORMAL HIGH (ref 8–23)
CO2: 27 mmol/L (ref 22–32)
Calcium: 8.8 mg/dL — ABNORMAL LOW (ref 8.9–10.3)
Chloride: 93 mmol/L — ABNORMAL LOW (ref 98–111)
Creatinine, Ser: 2.39 mg/dL — ABNORMAL HIGH (ref 0.61–1.24)
GFR, Estimated: 26 mL/min — ABNORMAL LOW (ref >60)
Glucose, Bld: 111 mg/dL — ABNORMAL HIGH (ref 70–99)
Potassium: 4.7 mmol/L (ref 3.5–5.1)
Sodium: 132 mmol/L — ABNORMAL LOW (ref 135–145)

## 2021-01-26 LAB — CBC WITH DIFFERENTIAL/PLATELET
Abs Immature Granulocytes: 0 10*3/uL (ref 0.00–0.07)
Basophils Absolute: 0 10*3/uL (ref 0.0–0.1)
Basophils Relative: 0 %
Eosinophils Absolute: 0 10*3/uL (ref 0.0–0.5)
Eosinophils Relative: 0 %
HCT: 39.3 % (ref 39.0–52.0)
Hemoglobin: 12.5 g/dL — ABNORMAL LOW (ref 13.0–17.0)
Lymphocytes Relative: 18 %
Lymphs Abs: 5 10*3/uL — ABNORMAL HIGH (ref 0.7–4.0)
MCH: 22.6 pg — ABNORMAL LOW (ref 26.0–34.0)
MCHC: 31.8 g/dL (ref 30.0–36.0)
MCV: 71.1 fL — ABNORMAL LOW (ref 80.0–100.0)
Monocytes Absolute: 1.1 10*3/uL — ABNORMAL HIGH (ref 0.1–1.0)
Monocytes Relative: 4 %
Neutro Abs: 21.6 10*3/uL — ABNORMAL HIGH (ref 1.7–7.7)
Neutrophils Relative %: 78 %
Platelets: 492 10*3/uL — ABNORMAL HIGH (ref 150–400)
RBC: 5.53 MIL/uL (ref 4.22–5.81)
RDW: 20.8 % — ABNORMAL HIGH (ref 11.5–15.5)
WBC: 27.7 10*3/uL — ABNORMAL HIGH (ref 4.0–10.5)
nRBC: 0 % (ref 0.0–0.2)
nRBC: 1 /100 WBC — ABNORMAL HIGH

## 2021-01-26 MED ORDER — SODIUM CHLORIDE 0.9 % IV SOLN
510.0000 mg | Freq: Once | INTRAVENOUS | Status: AC
  Administered 2021-01-27: 510 mg via INTRAVENOUS
  Filled 2021-01-26: qty 17

## 2021-01-26 MED ORDER — IPRATROPIUM-ALBUTEROL 0.5-2.5 (3) MG/3ML IN SOLN
3.0000 mL | Freq: Two times a day (BID) | RESPIRATORY_TRACT | Status: DC
  Administered 2021-01-26 – 2021-01-28 (×4): 3 mL via RESPIRATORY_TRACT
  Filled 2021-01-26 (×4): qty 3

## 2021-01-26 NOTE — Assessment & Plan Note (Signed)
For cardioversion 1/19.  Continue beta-blocker, amiodarone and Eliquis.

## 2021-01-26 NOTE — Assessment & Plan Note (Addendum)
Appreciate cardiology help.  Down 14 pounds with IV Lasix.  Now euvolemic.

## 2021-01-26 NOTE — Progress Notes (Addendum)
Advanced Heart Failure Rounding Note  PCP-Cardiologist: Shirlee More, MD   Subjective:   Admit weight 187---> 174 pounds.  1/17 Diuretics switched to po lasix.   Scr 2.78>>2.61>>2.73 >>2.4   BUN 88>>94>>107 >>99  WBC 16>>17>>22>>27 K. AF. Completed steroids 1/18   Feels better today.   Objective:   Weight Range: 79.3 kg Body mass index is 25.82 kg/m.   Vital Signs:   Temp:  [97.7 F (36.5 C)-98.2 F (36.8 C)] 97.7 F (36.5 C) (01/18 0820) Pulse Rate:  [90-107] 106 (01/18 0924) Resp:  [17-20] 20 (01/18 0831) BP: (107-122)/(68-81) 112/75 (01/18 0924) SpO2:  [90 %-97 %] 97 % (01/18 0829) Weight:  [79.3 kg] 79.3 kg (01/18 0540) Last BM Date: 01/25/21  Weight change: Filed Weights   01/24/21 0801 01/25/21 0412 01/26/21 0540  Weight: 80.5 kg 80.1 kg 79.3 kg    Intake/Output:   Intake/Output Summary (Last 24 hours) at 01/26/2021 0929 Last data filed at 01/26/2021 0900 Gross per 24 hour  Intake 860 ml  Output 1700 ml  Net -840 ml      Physical Exam   General:  In bed. No resp difficulty HEENT: normal Neck: supple. no JVD. Carotids 2+ bilat; no bruits. No lymphadenopathy or thryomegaly appreciated. Cor: PMI nondisplaced. Tach/irregular rate & rhythm. No rubs, gallops or murmurs. Lungs: Rhonchi throughout. On 2 liters [place].  Abdomen: soft, nontender, nondistended. No hepatosplenomegaly. No bruits or masses. Good bowel sounds. Extremities: no cyanosis, clubbing, rash, edema Neuro: alert & orientedx3, cranial nerves grossly intact. moves all 4 extremities w/o difficulty. Affect pleasant    Telemetry  AT 100s   EKG    N/A   Labs    CBC Recent Labs    01/25/21 0033 01/26/21 0055  WBC 22.2* 27.7*  NEUTROABS 13.5* 21.6*  HGB 11.9* 12.5*  HCT 37.8* 39.3  MCV 71.3* 71.1*  PLT 485* 892*   Basic Metabolic Panel Recent Labs    01/25/21 0033 01/26/21 0055  NA 131* 132*  K 4.5 4.7  CL 92* 93*  CO2 29 27  GLUCOSE 123* 111*  BUN 107* 99*   CREATININE 2.73* 2.39*  CALCIUM 8.5* 8.8*  MG 2.5*  --    Liver Function Tests No results for input(s): AST, ALT, ALKPHOS, BILITOT, PROT, ALBUMIN in the last 72 hours. No results for input(s): LIPASE, AMYLASE in the last 72 hours. Cardiac Enzymes No results for input(s): CKTOTAL, CKMB, CKMBINDEX, TROPONINI in the last 72 hours.  BNP: BNP (last 3 results) Recent Labs    12/24/20 0941 12/26/20 0404 01/18/21 1655  BNP 1,084.1* 1,213.1* 2,417.5*    ProBNP (last 3 results) Recent Labs    10/15/20 0841 10/27/20 0936 12/15/20 1127  PROBNP 5,768* 6,081* 18,617*     D-Dimer No results for input(s): DDIMER in the last 72 hours. Hemoglobin A1C No results for input(s): HGBA1C in the last 72 hours. Fasting Lipid Panel No results for input(s): CHOL, HDL, LDLCALC, TRIG, CHOLHDL, LDLDIRECT in the last 72 hours. Thyroid Function Tests No results for input(s): TSH, T4TOTAL, T3FREE, THYROIDAB in the last 72 hours.  Invalid input(s): FREET3  Other results:   Imaging    No results found.   Medications:     Scheduled Medications:  allopurinol  100 mg Oral Daily   amiodarone  400 mg Oral BID   apixaban  2.5 mg Oral BID   dextromethorphan-guaiFENesin  1 tablet Oral BID   feeding supplement  237 mL Oral TID BM   furosemide  40 mg Oral BID   ipratropium-albuterol  3 mL Nebulization TID   latanoprost  1 drop Both Eyes QHS   metoprolol succinate  25 mg Oral Daily   mometasone-formoterol  2 puff Inhalation BID   multivitamin with minerals  1 tablet Oral q morning   pantoprazole  20 mg Oral Daily   polyethylene glycol  17 g Oral Daily   senna-docusate  2 tablet Oral BID    Infusions:   PRN Medications: acetaminophen, guaiFENesin-dextromethorphan, ipratropium-albuterol    Patient Profile   Mr Tomasik is a 88 year old with history of CKD Stage IIIB, A flutter, HFmEF, HTN, TIA, IDA, and gout. He has not had an ischemic work up.     Admitted with acute hypoxic  respiratory failure, AECOPD + A/C HF.   Assessment/Plan  A/C HFmEF -NICM suspect tachy-mediated but he has not had an ischemic work up. Could consider myoview.  - Echo 01/20/21 25-30%---> 12/2020 EF 40-45%  - Diuresed with IV lasix. Overall weight down 13 - Volume status stable. Hold diuretics.    --BB-Continue Toprol XL 25 mg daily  -Ace/ARB/ARNI- no elevated creatinine -MRA-- no elevated creatinine  -SGLT2i- Could consider jardiance but with CKD Stage IV hold off.  - Renal function stable.    2. PAF -S/P DC-CV 12/2020 --> NSR - Went back in A fib and had another DC_CV 01/15/20--> SR - Went back in A fib 1/13.  Atrial Tach. He has DC-CV 1/19.  - Continue amio 400 mg twice a day + Toprol XL 25 mg daily. - Continue eliquis 2.5 mg twice a day.  ? Watchman would be a consideration.  - Will need to consider another cardioversion. Would like for him to recover from this exacerbation. From HF perspective he appears stable.    3. Acute Hypoxic Respiratory Failure in the setting of AECOPD - Given steroids, doxycycline + tamiflu at Urgent Care on 1/9  - On admit started on steroid taper and bronchodilators with improvement.  - Now down to 3 liters with stable O2 sats. He is not on oxygen at home. - ambulate to check home O2 needs - Afebrile.  - WBC elevated but completed steroids 1/17 - Discussed with Dr Haroldine Laws. Check 2 view CXR.    4. HTN  Stable.    5. CKD Stage IV -Creatinine baseline 2.4-2.6 -Todays creatinine trending back down 2.7>2.3  - BUN trending down 107>99. Hold diuretics.  - Followed by Dr Justin Mend   6. IDA -He was seen by Hematology 11/04/20 for anemia.  -Iron Sats 4%. Had Iron transfusions x2 - 1/15 Iron sats 11%.  - Hgb stable 11.9   - Recommendations for GI follow up.    7. DNR  -Palliative Care following.  -He was referred to outpatient Palliative    Length of Stay: [place], NP  01/26/2021, 9:29 AM  Advanced Heart Failure Team Pager [phone] (M-F;  7a - 5p)  Please contact [facility] for night-coverage after hours (5p -7a ) and weekends on [email or web address]  Patient seen and examined with the above-signed Advanced Practice Provider and/or Housestaff. I personally reviewed laboratory data, imaging studies and relevant notes. I independently examined the patient and formulated the important aspects of the plan. I have edited the note to reflect any of my changes or salient points. I have personally discussed the plan with the patient and/or family.  Remains weak but breathing better. Still in AF. Rates mildly elevated   WBC rising in setting of steroids.  General:  Elderly weak appearing. No resp difficulty HEENT: normal Neck: supple. no JVD. Carotids 2+ bilat; no bruits. No lymphadenopathy or thryomegaly appreciated. Cor: PMI nondisplaced. irregular rate & rhythm. No rubs, gallops or murmurs. Lungs: coarse Abdomen: soft, nontender, nondistended. No hepatosplenomegaly. No bruits or masses. Good bowel sounds. Extremities: no cyanosis, clubbing, rash, edema Neuro: alert & orientedx3, cranial nerves grossly intact. moves all 4 extremities w/o difficulty. Affect pleasant  Respiratory improving slowly. CXR today looks good. Will plan DC-CV tomorrow. Continue amio and Eliquis. Hold diuretics today. MCV very low. Check iron stores.   Glori Bickers, MD  10:19 AM

## 2021-01-26 NOTE — Assessment & Plan Note (Signed)
Improved, completed course of Tamiflu.

## 2021-01-26 NOTE — Hospital Course (Signed)
88 year old male past medical history of chronic systolic/diastolic heart failure, paroxysmal atrial fibrillation and stage IIIb chronic kidney disease who underwent cardioversion of atrial fibrillation on 1/5 and then since discharge, patient developed wheezing and shortness of breath.  He tested positive for the flu on 1/9, but presented to the emergency room on 1/10 despite steroids, doxycycline and Tamiflu.  Admitted for acute COPD exacerbation and noted to be hypoxic.  Hospital course complicated by recurrent atrial fibrillation and CHF.  Patient has responded to Lasix and steroids.  Ejection fraction noted at 25-30% and grade 1 diastolic dysfunction which look to be worse from echo from October of last year.  Cardiology consulted who recommended repeat cardioversion which will be done on 1/19.

## 2021-01-26 NOTE — Plan of Care (Signed)
  Problem: Education: Goal: Knowledge of General Education information will improve Description: Including pain rating scale, medication(s)/side effects and non-pharmacologic comfort measures Outcome: Progressing   Problem: Activity: Goal: Risk for activity intolerance will decrease Outcome: Progressing   Problem: Nutrition: Goal: Adequate nutrition will be maintained Outcome: Progressing   

## 2021-01-26 NOTE — Progress Notes (Signed)
Occupational Therapy Treatment Patient Details Name: Nicholas [name] MRN: [id number] DOB: Jul 11, 1933 Today's Date: 01/26/2021   History of present illness Pt is an 88 y.o. male admitted 01/18/21 with SOB, wheezing; pt reports testing (+) influenza B on 1/9, but tested (-) on admission. Workup for COPD exacerbation, CHF, potential influenza B. PMH includes CHF, PAF, CKD; of note, recent admission 12/24/20 s/p cardioversion for afib.   OT comments  Pt progressing gradually towards OT goals with focus on O2 line mgmt during ADLs. Pt with noted reliance on reaching out for support during in-room mobility without AD, requiring Min A to correct a LOB. Pt able to statically stand for ADLs at sink though noted unsteadiness when reaching overhead for tasks. Pt reports not requiring DME use for mobility with previous admission/discharge. Plan to further reinforce safe O2 line mgmt and energy conservation strategies with ADLs.  SpO2 WFL on 2 L O2, HR sustained 100s   Recommendations for follow up therapy are one component of a multi-disciplinary discharge planning process, led by the attending physician.  Recommendations may be updated based on patient status, additional functional criteria and insurance authorization.    Follow Up Recommendations  Home health OT    Assistance Recommended at Discharge Intermittent Supervision/Assistance  Patient can return home with the following  Assist for transportation;Direct supervision/assist for financial management;Direct supervision/assist for medications management;Assistance with cooking/housework;A little help with walking and/or transfers;A little help with bathing/dressing/bathroom   Equipment Recommendations  None recommended by OT    Recommendations for Other Services      Precautions / Restrictions Precautions Precautions: Fall Precaution Comments: Watch RR/SpO2 (does not wear O2 baseline); monitor HR Restrictions Weight Bearing Restrictions: No        Mobility Bed Mobility Overal bed mobility: Modified Independent Bed Mobility: Supine to Sit, Sit to Supine     Supine to sit: Modified independent (Device/Increase time) Sit to supine: Modified independent (Device/Increase time)        Transfers Overall transfer level: Needs assistance Equipment used: None Transfers: Sit to/from Stand Sit to Stand: Min guard                 Balance Overall balance assessment: Needs assistance Sitting-balance support: No upper extremity supported, Feet supported Sitting balance-Leahy Scale: Good     Standing balance support: No upper extremity supported, During functional activity Standing balance-Leahy Scale: Fair Standing balance comment: static balance without UE support, needs to have a hand on something when he walks                           ADL either performed or assessed with clinical judgement   ADL Overall ADL's : Needs assistance/impaired     Grooming: Min guard;Standing;Oral care;Wash/dry face;Brushing hair;Wash/dry hands Grooming Details (indicate cue type and reason): unsteadiness with unsupported standing, posterior bias when reaching overhead to brush hair             Lower Body Dressing: Set up;Sit to/from stand Lower Body Dressing Details (indicate cue type and reason): to don tennis shoes sitting EOB via figure four position             Functional mobility during ADLs: Min guard General ADL Comments: Noted LOB with short distance mobility without AD (reaching out for support), balance deficits more noticeable this admission than previous admission    Extremity/Trunk Assessment Upper Extremity Assessment Upper Extremity Assessment: Generalized weakness   Lower Extremity Assessment Lower Extremity Assessment: Defer  to PT evaluation        Vision   Vision Assessment?: No apparent visual deficits   Perception     Praxis      Cognition Arousal/Alertness:  Awake/alert Behavior During Therapy: WFL for tasks assessed/performed Overall Cognitive Status: Within Functional Limits for tasks assessed                                          Exercises      Shoulder Instructions       General Comments SpO2 WFL on 2 L O2, HR sustained 107    Pertinent Vitals/ Pain       Pain Assessment Pain Assessment: No/denies pain  Home Living                                          Prior Functioning/Environment              Frequency  Min 2X/week        Progress Toward Goals  OT Goals(current goals can now be found in the care plan section)  Progress towards OT goals: Progressing toward goals  Acute Rehab OT Goals Patient Stated Goal: feel better, get some rest OT Goal Formulation: With patient Time For Goal Achievement: 02/02/21 Potential to Achieve Goals: Good ADL Goals Additional ADL Goal #1: pt will complete 2 energy conservation strategies with adls Additional ADL Goal #2: Pt will complete line management for O2 mod I for adls.  Plan Discharge plan remains appropriate    Co-evaluation                 AM-PAC OT "6 Clicks" Daily Activity     Outcome Measure   Help from another person eating meals?: None Help from another person taking care of personal grooming?: A Little Help from another person toileting, which includes using toliet, bedpan, or urinal?: A Little Help from another person bathing (including washing, rinsing, drying)?: A Little Help from another person to put on and taking off regular upper body clothing?: None Help from another person to put on and taking off regular lower body clothing?: A Little 6 Click Score: 20    End of Session Equipment Utilized During Treatment: Oxygen  OT Visit Diagnosis: Unsteadiness on feet (R26.81);Muscle weakness (generalized) (M62.81)   Activity Tolerance Patient tolerated treatment well   Patient Left in bed;with call bell/phone  within reach;with bed alarm set   Nurse Communication          Time: [phone] OT Time Calculation (min): 16 min  Charges: OT General Charges $OT Visit: 1 Visit OT Treatments $Self Care/Home Management : 8-22 mins  Malachy Chamber, OTR/L Acute Rehab Services Office: [phone]   Layla Maw 01/26/2021, 8:03 AM

## 2021-01-26 NOTE — Assessment & Plan Note (Signed)
Much improved, down to 2 L nasal cannula.  On p.o. prednisone.

## 2021-01-26 NOTE — Progress Notes (Addendum)
[facility] Progress Note  Patient: Nicholas [name]    GUR:[id number]  DOA: 01/18/2021    Date of Service: the patient was seen and examined on 01/26/2021  Brief hospital course: 88 year old male past medical history of chronic systolic/diastolic heart failure, paroxysmal atrial fibrillation and stage IIIb chronic kidney disease who underwent cardioversion of atrial fibrillation on 1/5 and then since discharge, patient developed wheezing and shortness of breath.  He tested positive for the flu on 1/9, but presented to the emergency room on 1/10 despite steroids, doxycycline and Tamiflu.  Admitted for acute COPD exacerbation and noted to be hypoxic.  Hospital course complicated by recurrent atrial fibrillation and CHF.  Patient has responded to Lasix and steroids.  Ejection fraction noted at 25-30% and grade 1 diastolic dysfunction which look to be worse from echo from October of last year.  Cardiology consulted who recommended repeat cardioversion which will be done on 1/19.  Assessment and Plan: Cardiovascular and Mediastinum A-fib [facility]) Assessment & Plan For cardioversion 1/19.  Continue beta-blocker, amiodarone and Eliquis.  Acute combined systolic and diastolic congestive heart failure [facility]) Assessment & Plan Appreciate cardiology help.  Down 13 pounds with IV Lasix.  Now euvolemic.  HTN (hypertension) Assessment & Plan Blood pressure stable.  Continue medications.  Respiratory Influenza B Assessment & Plan Improved, completed course of Tamiflu.  * COPD exacerbation ([place]) Assessment & Plan Much improved, down to 2 L nasal cannula.  On p.o. prednisone.  Genitourinary Stage IV chronic kidney disease (CKD) ([place]) Assessment & Plan Renal function stable.  Monitoring while getting diuresed.    Body mass index is 25.82 kg/m.  Nutrition Problem: Moderate Malnutrition Etiology: chronic illness (CHF, CKD, COPD)     Consultants: Cardiology  Procedures: Cardioversion plan  1/18 Echocardiogram  Antimicrobials: Completed course of Tamiflu  Code Status: DNR   Subjective: Patient okay, breathing a little bit easier.  Denies any chest pain.  Objective: Vital signs were reviewed and unremarkable except for: Heart rate at times tachycardic Vitals:   01/26/21 0924 01/26/21 1053  BP: 112/75 113/77  Pulse: (!) 106 (!) 113  Resp:  20  Temp:  (!) 97.5 F (36.4 C)  SpO2:  95%    Intake/Output Summary (Last 24 hours) at 01/26/2021 1533 Last data filed at 01/26/2021 0900 Gross per 24 hour  Intake 680 ml  Output 1400 ml  Net -720 ml   Filed Weights   01/24/21 0801 01/25/21 0412 01/26/21 0540  Weight: 80.5 kg 80.1 kg 79.3 kg   Body mass index is 25.82 kg/m.  Exam:  General: Alert and oriented x2, hard of hearing HEENT: Normocephalic, atraumatic, mucous membranes slightly dry Cardiovascular: Irregular rhythm, borderline tachycardia Respiratory: Scattered rhonchi Abdomen: Soft, nontender, nondistended, positive bowel sounds Musculoskeletal: No clubbing or cyanosis, 1+ pitting edema bilaterally Skin: No skin breaks, tears or lesions Psychiatry: Appropriate, no evidence of psychoses Neurology: No focal deficits  Data Reviewed: Noted elevation of white count although to be expected and steroids with improvement in creatinine and  Disposition:  Status is: Inpatient  Remains inpatient appropriate because: Need for cardioversion        Family Communication: Daughter at the bedside  DVT Prophylaxis: apixaban (ELIQUIS) tablet 2.5 mg Start: 01/19/21 1000 apixaban (ELIQUIS) tablet 2.5 mg    Author: Annita Brod ,MD 01/26/2021 3:33 PM  To reach On-call, see care teams to locate the attending and reach out via [email or web address]. Between 7PM-7AM, please contact night-coverage If you still have difficulty reaching the attending provider, please  page the [facility] (Director on Call) for [facility] on amion for assistance.

## 2021-01-26 NOTE — Assessment & Plan Note (Signed)
Renal function stable.  Monitoring while getting diuresed.

## 2021-01-26 NOTE — Progress Notes (Signed)
Physical Therapy Treatment Patient Details Name: Nicholas [name] MRN: [id number] DOB: 1933-05-19 Today's Date: 01/26/2021   History of Present Illness Pt is an 88 y.o. male admitted 01/18/21 with SOB, wheezing; pt reports testing (+) influenza B on 1/9, but tested (-) on admission. Workup for COPD exacerbation, CHF, potential influenza B. Course complicated by afib; plan for DC-CV 1/19. PMH includes CHF, PAF, CKD; of note, recent admission 12/24/20 s/p cardioversion for afib.   PT Comments    Pt progressing with mobility. Static and dynamic stability improved with walker use; will trial rollator next session for added stability and energy conservation. Pt endorses increased fatigue this session. Pt's daughter [name]) present and supportive. Updating d/c recommendations from No PT Follow-up to HHPT services to maximize functional mobility and independence. Will continue to follow acutely.    Recommendations for follow up therapy are one component of a multi-disciplinary discharge planning process, led by the attending physician.  Recommendations may be updated based on patient status, additional functional criteria and insurance authorization.  Follow Up Recommendations  Home health PT     Assistance Recommended at Discharge Intermittent Supervision/Assistance  Patient can return home with the following Assistance with cooking/housework;Assist for transportation;A little help with bathing/dressing/bathroom;Direct supervision/assist for financial management;Direct supervision/assist for medications management   Equipment Recommendations  BSC/3in1; TBD next session - rolling walker vs. rollator    Recommendations for Other Services       Precautions / Restrictions Precautions Precautions: Fall;Other (comment) Precaution Comments: Watch RR/SpO2 (does not wear O2 baseline); monitor HR Restrictions Weight Bearing Restrictions: No     Mobility  Bed Mobility               General  bed mobility comments: Received sitting in recliner    Transfers Overall transfer level: Needs assistance Equipment used: Rolling walker (2 wheels) Transfers: Sit to/from Stand Sit to Stand: Supervision                Ambulation/Gait Ambulation/Gait assistance: Min guard Gait Distance (Feet): 252 Feet Assistive device: Rolling walker (2 wheels) Gait Pattern/deviations: Step-through pattern, Decreased stride length, Trunk flexed Gait velocity: Decreased     General Gait Details: Slow, mostly steady gait with RW and min guard for balance; 1x standing rest break; pt picking up RW to turn, cues to keep wheels on ground; pt happy to have shoes to walk in. Pt wants to trial rollator next session   Stairs             Wheelchair Mobility    Modified Rankin (Stroke Patients Only)       Balance Overall balance assessment: Needs assistance Sitting-balance support: No upper extremity supported, Feet supported Sitting balance-Leahy Scale: Good     Standing balance support: No upper extremity supported, During functional activity Standing balance-Leahy Scale: Fair Standing balance comment: static balance without UE support, preference for UE support with dynamic stability                            Cognition Arousal/Alertness: Awake/alert Behavior During Therapy: WFL for tasks assessed/performed Overall Cognitive Status: Within Functional Limits for tasks assessed                                          Exercises      General Comments General comments (skin integrity, edema, etc.): Difficulty getting  reliable pulse ox reading; SpO2 93-96% on 2L O2 [place] via portable finger probe (although HR not matching telemetry box); HR 96-110s. Daughter [name]) present and supportive - discussed DME, follow-up and assist needs - in agreement with updating recs to HHPT services, shower seat, and RW vs. rollator (TBD)      Pertinent Vitals/Pain Pain  Assessment Pain Assessment: Faces Faces Pain Scale: Hurts a little bit Pain Location: shoulders Pain Descriptors / Indicators: Discomfort Pain Intervention(s): Monitored during session    Home Living                          Prior Function            PT Goals (current goals can now be found in the care plan section) Progress towards PT goals: Progressing toward goals    Frequency    Min 3X/week      PT Plan Discharge plan needs to be updated    Co-evaluation              AM-PAC PT "6 Clicks" Mobility   Outcome Measure  Help needed turning from your back to your side while in a flat bed without using bedrails?: None Help needed moving from lying on your back to sitting on the side of a flat bed without using bedrails?: A Little Help needed moving to and from a bed to a chair (including a wheelchair)?: A Little Help needed standing up from a chair using your arms (e.g., wheelchair or bedside chair)?: A Little Help needed to walk in hospital room?: A Little Help needed climbing 3-5 steps with a railing? : A Little 6 Click Score: 19    End of Session Equipment Utilized During Treatment: Oxygen Activity Tolerance: Patient tolerated treatment well;Patient limited by fatigue Patient left: with call bell/phone within reach;in chair;with family/visitor present Nurse Communication: Mobility status PT Visit Diagnosis: Other abnormalities of gait and mobility (R26.89)     Time: [id number] PT Time Calculation (min) (ACUTE ONLY): 20 min  Charges:  $Gait Training: 8-22 mins                     Mabeline Caras, PT, DPT Acute Rehabilitation Services  Pager [phone] Office [place] 01/26/2021, 3:25 PM

## 2021-01-26 NOTE — Assessment & Plan Note (Signed)
Blood pressure stable.  Continue medications.

## 2021-01-27 ENCOUNTER — Encounter (HOSPITAL_COMMUNITY)
Admission: EM | Disposition: A | Discharge status: Home Health | Payer: Self-pay | Source: Home / Self Care | Attending: Internal Medicine

## 2021-01-27 ENCOUNTER — Encounter (HOSPITAL_COMMUNITY): Payer: Self-pay | Admitting: Internal Medicine

## 2021-01-27 ENCOUNTER — Inpatient Hospital Stay (HOSPITAL_COMMUNITY): Payer: PPO

## 2021-01-27 DIAGNOSIS — I482 Chronic atrial fibrillation, unspecified: Secondary | ICD-10-CM | POA: Diagnosis not present

## 2021-01-27 DIAGNOSIS — N184 Chronic kidney disease, stage 4 (severe): Secondary | ICD-10-CM | POA: Diagnosis not present

## 2021-01-27 HISTORY — PX: CARDIOVERSION: SHX1299

## 2021-01-27 LAB — CBC WITH DIFFERENTIAL/PLATELET
Abs Immature Granulocytes: 0 10*3/uL (ref 0.00–0.07)
Basophils Absolute: 0 10*3/uL (ref 0.0–0.1)
Basophils Relative: 0 %
Eosinophils Absolute: 0 10*3/uL (ref 0.0–0.5)
Eosinophils Relative: 0 %
HCT: 37.8 % — ABNORMAL LOW (ref 39.0–52.0)
Hemoglobin: 11.7 g/dL — ABNORMAL LOW (ref 13.0–17.0)
Lymphocytes Relative: 23 %
Lymphs Abs: 4.6 10*3/uL — ABNORMAL HIGH (ref 0.7–4.0)
MCH: 22.3 pg — ABNORMAL LOW (ref 26.0–34.0)
MCHC: 31 g/dL (ref 30.0–36.0)
MCV: 72 fL — ABNORMAL LOW (ref 80.0–100.0)
Monocytes Absolute: 1 10*3/uL (ref 0.1–1.0)
Monocytes Relative: 5 %
Neutro Abs: 14.3 10*3/uL — ABNORMAL HIGH (ref 1.7–7.7)
Neutrophils Relative %: 72 %
Platelets: 382 10*3/uL (ref 150–400)
RBC: 5.25 MIL/uL (ref 4.22–5.81)
RDW: 20.7 % — ABNORMAL HIGH (ref 11.5–15.5)
WBC: 19.9 10*3/uL — ABNORMAL HIGH (ref 4.0–10.5)
nRBC: 0 % (ref 0.0–0.2)
nRBC: 0 /100 WBC

## 2021-01-27 LAB — BASIC METABOLIC PANEL
Anion gap: 10 (ref 5–15)
BUN: 96 mg/dL — ABNORMAL HIGH (ref 8–23)
CO2: 27 mmol/L (ref 22–32)
Calcium: 8.7 mg/dL — ABNORMAL LOW (ref 8.9–10.3)
Chloride: 95 mmol/L — ABNORMAL LOW (ref 98–111)
Creatinine, Ser: 2.29 mg/dL — ABNORMAL HIGH (ref 0.61–1.24)
GFR, Estimated: 27 mL/min — ABNORMAL LOW (ref >60)
Glucose, Bld: 110 mg/dL — ABNORMAL HIGH (ref 70–99)
Potassium: 4.9 mmol/L (ref 3.5–5.1)
Sodium: 132 mmol/L — ABNORMAL LOW (ref 135–145)

## 2021-01-27 LAB — PROTIME-INR
INR: 1.4 — ABNORMAL HIGH (ref 0.8–1.2)
Prothrombin Time: 17 seconds — ABNORMAL HIGH (ref 11.4–15.2)

## 2021-01-27 SURGERY — CARDIOVERSION
Anesthesia: General

## 2021-01-27 MED ORDER — LIDOCAINE 2% (20 MG/ML) 5 ML SYRINGE
INTRAMUSCULAR | Status: DC | PRN
  Administered 2021-01-27: 50 mg via INTRAVENOUS

## 2021-01-27 MED ORDER — SODIUM CHLORIDE 0.9 % IV SOLN: INTRAVENOUS | Status: DC | PRN

## 2021-01-27 MED ORDER — SODIUM CHLORIDE 0.45 % IV SOLN: INTRAVENOUS | Status: DC

## 2021-01-27 MED ORDER — PROPOFOL 10 MG/ML IV BOLUS
INTRAVENOUS | Status: DC | PRN
Start: 2021-01-27 — End: 2021-01-27
  Administered 2021-01-27: 50 mg via INTRAVENOUS

## 2021-01-27 MED ORDER — PHENYLEPHRINE 40 MCG/ML (10ML) SYRINGE FOR IV PUSH (FOR BLOOD PRESSURE SUPPORT)
PREFILLED_SYRINGE | INTRAVENOUS | Status: DC | PRN
  Administered 2021-01-27 (×2): 60 ug via INTRAVENOUS

## 2021-01-27 NOTE — Anesthesia Preprocedure Evaluation (Addendum)
Anesthesia Evaluation  Patient identified by MRN, date of birth, ID band Patient awake    Reviewed: Allergy & Precautions, NPO status , Patient's Chart, lab work & pertinent test results, reviewed documented beta blocker date and time   History of Anesthesia Complications Negative for: history of anesthetic complications  Airway Mallampati: II  TM Distance: >3 FB Neck ROM: Full    Dental  (+) Upper Dentures, Lower Dentures   Pulmonary asthma , COPD, former smoker,    + rhonchi  (-) wheezing      Cardiovascular hypertension, Pt. on medications and Pt. on home beta blockers +CHF  Normal cardiovascular examAtrial Fibrillation    Echo 10/25/20: EF 40-45%, no RWMA, mod LVH, nl RVSF, mod pulm HTN, mod LAE, mild MR, mild AR, mild AS (MG 17, AVA 1.82), ascending aorta 39 mm   Neuro/Psych TIAnegative psych ROS   GI/Hepatic negative GI ROS,   Endo/Other  negative endocrine ROS  Renal/GU CRFRenal disease     Musculoskeletal  (+) Arthritis , Osteoarthritis,    Abdominal   Peds  Hematology  (+) anemia ,  On eliquis    Anesthesia Other Findings On 2L [place] as inpatient  Reproductive/Obstetrics                          Anesthesia Physical Anesthesia Plan  ASA: 3  Anesthesia Plan: General   Post-op Pain Management: Minimal or no pain anticipated   Induction: Intravenous  PONV Risk Score and Plan: 2 and Treatment may vary due to age or medical condition and Propofol infusion  Airway Management Planned: Simple Face Mask and Natural Airway  Additional Equipment: None  Intra-op Plan:   Post-operative Plan: Extubation in OR  Informed Consent: I have reviewed the patients History and Physical, chart, labs and discussed the procedure including the risks, benefits and alternatives for the proposed anesthesia with the patient or authorized representative who has indicated his/her understanding and  acceptance.   Patient has DNR.  Discussed DNR with patient and Suspend DNR.   Dental advisory given  Plan Discussed with: Anesthesiologist and CRNA  Anesthesia Plan Comments:       Anesthesia Quick Evaluation

## 2021-01-27 NOTE — Plan of Care (Signed)
°  Problem: Health Behavior/Discharge Planning: Goal: Ability to manage health-related needs will improve Outcome: Progressing   Problem: Clinical Measurements: Goal: Ability to maintain clinical measurements within normal limits will improve Outcome: Progressing Goal: Will remain free from infection Outcome: Progressing Goal: Respiratory complications will improve Outcome: Progressing Goal: Cardiovascular complication will be avoided Outcome: Progressing   Problem: Activity: Goal: Risk for activity intolerance will decrease Outcome: Progressing   Problem: Coping: Goal: Level of anxiety will decrease Outcome: Progressing   Problem: Elimination: Goal: Will not experience complications related to bowel motility Outcome: Progressing Goal: Will not experience complications related to urinary retention Outcome: Progressing   Problem: Education: Goal: Knowledge of General Education information will improve Description: Including pain rating scale, medication(s)/side effects and non-pharmacologic comfort measures Outcome: Not Progressing

## 2021-01-27 NOTE — Progress Notes (Addendum)
Advanced Heart Failure Rounding Note  PCP-Cardiologist: Shirlee More, MD   Subjective:    1/17 IV lasix switched to po 1/18 Held diuretics  Scr 2.78>>2.61>>2.73>>2.4>>2.3  BUN 88>>94>>107 >>99>>96  WBC 16>>17>>22>>27>>20 K. Remains AF. Completed steroids 1/18   Remains in AF. Going for DCCV today.  No complaints. Remains fatigued. Able to ambulate about 250 ft with PT yesterday.   Denies dyspnea or CP  Objective:   Weight Range: 78.6 kg Body mass index is 25.59 kg/m.   Vital Signs:   Temp:  [97.5 F (36.4 C)-97.9 F (36.6 C)] 97.8 F (36.6 C) (01/19 0742) Pulse Rate:  [98-113] 98 (01/19 0742) Resp:  [17-20] 17 (01/19 0742) BP: (106-113)/(72-81) 109/72 (01/19 0742) SpO2:  [90 %-97 %] 95 % (01/19 0742) Weight:  [78.6 kg] 78.6 kg (01/19 0310) Last BM Date: 01/26/21  Weight change: Filed Weights   01/25/21 0412 01/26/21 0540 01/27/21 0310  Weight: 80.1 kg 79.3 kg 78.6 kg    Intake/Output:   Intake/Output Summary (Last 24 hours) at 01/27/2021 0805 Last data filed at 01/27/2021 0310 Gross per 24 hour  Intake 680 ml  Output 900 ml  Net -220 ml      Physical Exam  General:  No distress. Lying comfortably in bed HEENT: normal Neck: supple. no JVD. Carotids 2+ bilat; no bruits. No lymphadenopathy or thryomegaly appreciated. Cor: PMI nondisplaced. Irregular rhythm. No rubs, gallops or murmurs. Lungs: clear, O2 sats stable on 2L [place] Abdomen: soft, nontender, nondistended. No hepatosplenomegaly. No bruits or masses. Good bowel sounds. Extremities: no cyanosis, clubbing, rash, edema Neuro: alert & orientedx3, cranial nerves grossly intact. moves all 4 extremities w/o difficulty. Affect pleasant     Telemetry  AF 90s-100s  EKG    N/A   Labs    CBC Recent Labs    01/26/21 0055 01/27/21 0101  WBC 27.7* 19.9*  NEUTROABS 21.6* 14.3*  HGB 12.5* 11.7*  HCT 39.3 37.8*  MCV 71.1* 72.0*  PLT 492* 342   Basic Metabolic Panel Recent Labs     01/25/21 0033 01/26/21 0055 01/27/21 0101  NA 131* 132* 132*  K 4.5 4.7 4.9  CL 92* 93* 95*  CO2 29 27 27   GLUCOSE 123* 111* 110*  BUN 107* 99* 96*  CREATININE 2.73* 2.39* 2.29*  CALCIUM 8.5* 8.8* 8.7*  MG 2.5*  --   --    Liver Function Tests No results for input(s): AST, ALT, ALKPHOS, BILITOT, PROT, ALBUMIN in the last 72 hours. No results for input(s): LIPASE, AMYLASE in the last 72 hours. Cardiac Enzymes No results for input(s): CKTOTAL, CKMB, CKMBINDEX, TROPONINI in the last 72 hours.  BNP: BNP (last 3 results) Recent Labs    12/24/20 0941 12/26/20 0404 01/18/21 1655  BNP 1,084.1* 1,213.1* 2,417.5*    ProBNP (last 3 results) Recent Labs    10/15/20 0841 10/27/20 0936 12/15/20 1127  PROBNP 5,768* 6,081* 18,617*     D-Dimer No results for input(s): DDIMER in the last 72 hours. Hemoglobin A1C No results for input(s): HGBA1C in the last 72 hours. Fasting Lipid Panel No results for input(s): CHOL, HDL, LDLCALC, TRIG, CHOLHDL, LDLDIRECT in the last 72 hours. Thyroid Function Tests No results for input(s): TSH, T4TOTAL, T3FREE, THYROIDAB in the last 72 hours.  Invalid input(s): FREET3  Other results:   Imaging    DG Chest 2 View  Result Date: 01/26/2021 CLINICAL DATA:  Dyspnea EXAM: CHEST - 2 VIEW COMPARISON:  Chest radiograph 01/24/2021 FINDINGS: The heart is enlarged,  unchanged. The upper mediastinal contours are stable. There are trace bilateral pleural effusions with mild atelectasis in the right base, not significantly changed since the most recent prior study but slightly improved since 01/18/2021. There is no new or worsening focal airspace disease. There is no pulmonary edema. There is no pneumothorax. The bones are stable, including partially imaged right shoulder arthroplasty hardware with slight superior migration of the humeral head component. IMPRESSION: 1. Trace bilateral pleural effusions with mild atelectasis in the right base, not  significantly changed since the most recent prior study but slightly improved since 01/18/2021. 2. Unchanged cardiomegaly. Electronically Signed   By: Valetta Mole M.D.   On: 01/26/2021 10:52     Medications:     Scheduled Medications:  allopurinol  100 mg Oral Daily   amiodarone  400 mg Oral BID   apixaban  2.5 mg Oral BID   dextromethorphan-guaiFENesin  1 tablet Oral BID   feeding supplement  237 mL Oral TID BM   ipratropium-albuterol  3 mL Nebulization BID   latanoprost  1 drop Both Eyes QHS   metoprolol succinate  25 mg Oral Daily   mometasone-formoterol  2 puff Inhalation BID   multivitamin with minerals  1 tablet Oral q morning   pantoprazole  20 mg Oral Daily   polyethylene glycol  17 g Oral Daily   senna-docusate  2 tablet Oral BID    Infusions:  ferumoxytol      PRN Medications: acetaminophen, guaiFENesin-dextromethorphan, ipratropium-albuterol    Patient Profile   Mr Ned is a 88 year old with history of CKD Stage IIIB, A flutter, HFmEF, HTN, TIA, IDA, and gout. He has not had an ischemic work up.     Admitted with acute hypoxic respiratory failure, AECOPD + A/C HF.   Assessment/Plan  A/C HFmEF -NICM suspect tachy-mediated but he has not had an ischemic work up. Could consider myoview.  - Echo 01/20/21 25-30%---> 12/2020 EF 40-45%  - Diuresed with IV lasix. Overall weight down 14 - Volume status stable. Hold diuretics.   - Continue Toprol XL 25 mg daily  - No ACE/ARB/ARNI- with elevated creatinine/K upper normal - No MRA with elevated creatinine, K upper normal  - SGLT2i- Could consider jardiance but with CKD Stage IV hold off.  - Renal function stable.    2. PAF -S/P DC-CV 12/2020 --> NSR - Went back in A fib and had another DC_CV 01/15/20--> SR - Went back in A fib 1/13.  Atrial Tach. - Continue amio 400 mg twice a day + Toprol XL 25 mg daily. - Continue eliquis 2.5 mg twice a day.  ? Watchman would be a consideration.  - Going for DCCV this  afternoon   3. Acute Hypoxic Respiratory Failure in the setting of AECOPD - Given steroids, doxycycline + tamiflu at Urgent Care on 1/9  - On admit started on steroid taper and bronchodilators with improvement.  - Now down to 2 liters with stable O2 sats. He is not on oxygen at home. - Check home O2 needs - Afebrile.  - WBC elevated but completed steroids 1/18, now trending back down - CXR 01/18 stable   4. HTN  - BP Stable.    5. CKD Stage IV -Creatinine baseline 2.4-2.6 -Creatinine trending back down 2.7>2.3  - BUN trending down 107>99>96. Hold diuretics.  - Followed by Dr Justin Mend   6. IDA -He was seen by Hematology 11/04/20 for anemia.  - Iron Sats 4% 10/22. Had Iron transfusions x2 -  1/15 Iron sats 11%. Ferritin 211. - Received feraheme 01/16 - Hgb stable 11.7   - Recommend GI follow up.    7. DNR  -Palliative Care following.  -He was referred to outpatient Palliative    Need HH PT/OT at discharge. Consult TOC. Continue to assess O2 needs.   Length of Stay: 9  FINCH, LINDSAY N, PA-C  01/27/2021, 8:05 AM  Advanced Heart Failure Team Pager [phone] (M-F; 7a - 5p)  Please contact [facility] for night-coverage after hours (5p -7a ) and weekends on [email or web address]   Patient seen and examined with the above-signed Advanced Practice Provider and/or Housestaff. I personally reviewed laboratory data, imaging studies and relevant notes. I independently examined the patient and formulated the important aspects of the plan. I have edited the note to reflect any of my changes or salient points. I have personally discussed the plan with the patient and/or family.  Breathing better. WBC improving off steroids. Still in AFF/At[place] on amio and Eliquis.   General:  Weak appearing. No resp difficulty HEENT: normal Neck: supple. no JVD. Carotids 2+ bilat; no bruits. No lymphadenopathy or thryomegaly appreciated. Cor: PMI nondisplaced. Irregular rate & rhythm. No rubs, gallops or  murmurs. Lungs: coarse  Abdomen: soft, nontender, nondistended. No hepatosplenomegaly. No bruits or masses. Good bowel sounds. Extremities: no cyanosis, clubbing, rash, edema Neuro: alert & orientedx3, cranial nerves grossly intact. moves all 4 extremities w/o difficulty. Affect pleasant  Respiratory status improved. Will proceed with DC-CV today. Given severity of lung disease hopefully can cut amio back soon.   Glori Bickers, MD  8:48 AM

## 2021-01-27 NOTE — Transfer of Care (Signed)
Immediate Anesthesia Transfer of Care Note  Patient: Nicholas [name]  Procedure(s) Performed: CARDIOVERSION  Patient Location: Endoscopy Unit  Anesthesia Type:General  Level of Consciousness: awake, alert  and oriented  Airway & Oxygen Therapy: Patient Spontanous Breathing and Patient connected to nasal cannula oxygen  Post-op Assessment: Report given to RN and Post -op Vital signs reviewed and stable  Post vital signs: Reviewed and stable  Last Vitals:  Vitals Value Taken Time  BP    Temp    Pulse 68 01/27/21 1318  Resp 17 01/27/21 1318  SpO2 97 % 01/27/21 1318  Vitals shown include unvalidated device data.  Last Pain:  Vitals:   01/27/21 1223  TempSrc: Oral  PainSc: 0-No pain         Complications: No notable events documented.

## 2021-01-27 NOTE — Anesthesia Postprocedure Evaluation (Signed)
Anesthesia Post Note  Patient: Nicholas [name]  Procedure(s) Performed: CARDIOVERSION     Patient location during evaluation: PACU Anesthesia Type: General Level of consciousness: awake and alert Pain management: pain level controlled Vital Signs Assessment: post-procedure vital signs reviewed and stable Respiratory status: spontaneous breathing, nonlabored ventilation, respiratory function stable and patient connected to nasal cannula oxygen Cardiovascular status: blood pressure returned to baseline and stable Postop Assessment: no apparent nausea or vomiting Anesthetic complications: no   No notable events documented.  Last Vitals:  Vitals:   01/27/21 1330 01/27/21 1340  BP: (!) 104/56 (!) 112/56  Pulse: 70 71  Resp: 18 18  Temp:    SpO2: 96% 97%    Last Pain:  Vitals:   01/27/21 1340  TempSrc:   PainSc: 0-No pain                 Audry Pili

## 2021-01-27 NOTE — Anesthesia Procedure Notes (Signed)
Procedure Name: General with mask airway Date/Time: 01/27/2021 1:04 PM Performed by: Mariea Clonts, CRNA Pre-anesthesia Checklist: Timeout performed, Patient being monitored, Suction available, Emergency Drugs available and Patient identified Patient Re-evaluated:Patient Re-evaluated prior to induction Oxygen Delivery Method: Simple face mask Preoxygenation: Pre-oxygenation with 100% oxygen Induction Type: IV induction

## 2021-01-27 NOTE — Progress Notes (Signed)
[facility] Progress Note  Patient: Nicholas [name]    OIZ:[id number]  DOA: 01/18/2021    Date of Service: the patient was seen and examined on 01/27/2021  Brief hospital course: 88 year old male past medical history of chronic systolic/diastolic heart failure, paroxysmal atrial fibrillation and stage IIIb chronic kidney disease who underwent cardioversion of atrial fibrillation on 1/5 and then since discharge, patient developed wheezing and shortness of breath.  He tested positive for the flu on 1/9, but presented to the emergency room on 1/10 despite steroids, doxycycline and Tamiflu.  Admitted for acute COPD exacerbation and noted to be hypoxic.  Hospital course complicated by recurrent atrial fibrillation and CHF.  Patient has responded to Lasix and steroids.  Ejection fraction noted at 25-30% and grade 1 diastolic dysfunction which look to be worse from echo from October of last year.  Cardiology consulted who recommended repeat cardioversion which will be done on 1/19.  Assessment and Plan: Cardiovascular and Mediastinum A-fib [facility]) Assessment & Plan For cardioversion 1/19.  Continue beta-blocker, amiodarone and Eliquis.  Acute combined systolic and diastolic congestive heart failure [facility]) Assessment & Plan Appreciate cardiology help.  Down 13 pounds with IV Lasix.  Now euvolemic.  HTN (hypertension) Assessment & Plan Blood pressure stable.  Continue medications.  Respiratory Influenza B Assessment & Plan Improved, completed course of Tamiflu.  * COPD exacerbation ([place]) Assessment & Plan Much improved, down to 2 L nasal cannula.  On p.o. prednisone.  Genitourinary Stage IV chronic kidney disease (CKD) ([place]) Assessment & Plan Renal function stable.  Monitoring while getting diuresed.    Body mass index is 25.59 kg/m.  Nutrition Problem: Moderate Malnutrition Etiology: chronic illness (CHF, CKD, COPD)     Consultants: Cardiology  Procedures: Cardioversion plan  1/18 Echocardiogram  Antimicrobials: Completed course of Tamiflu  Code Status: DNR   Subjective: No complaints, breathing okay  Objective: Vital signs stable, unremarkable Vitals:   01/27/21 1330 01/27/21 1340  BP: (!) 104/56 (!) 112/56  Pulse: 70 71  Resp: 18 18  Temp:    SpO2: 96% 97%    Intake/Output Summary (Last 24 hours) at 01/27/2021 1522 Last data filed at 01/27/2021 1318 Gross per 24 hour  Intake 100 ml  Output 850 ml  Net -750 ml    Filed Weights   01/26/21 0540 01/27/21 0310 01/27/21 1223  Weight: 79.3 kg 78.6 kg 78.6 kg   Body mass index is 25.59 kg/m.  Exam:  General: Alert and oriented x2, hard of hearing HEENT: Normocephalic, atraumatic, mucous membranes slightly dry Cardiovascular: Irregular rhythm, rate controlled Respiratory: Few rhonchi Abdomen: Soft, nontender, nondistended, positive bowel sounds Musculoskeletal: No clubbing or cyanosis, 1+ pitting edema bilaterally Skin: No skin breaks, tears or lesions Psychiatry: Appropriate, no evidence of psychoses Neurology: No focal deficits  Data Reviewed: White count 19.9, due to steroids.  Improvement from previous day.  Renal function continues to improve, creatinine today at 2.29  Disposition:  Status is: Inpatient  Remains inpatient appropriate because: Need for cardioversion    Family Communication: Daughter at the bedside  DVT Prophylaxis: apixaban (ELIQUIS) tablet 2.5 mg Start: 01/19/21 1000 apixaban (ELIQUIS) tablet 2.5 mg    Author: Annita Brod ,MD 01/27/2021 3:22 PM  To reach On-call, see care teams to locate the attending and reach out via [email or web address]. Between 7PM-7AM, please contact night-coverage If you still have difficulty reaching the attending provider, please page the [facility] (Director on Call) for [facility] on amion for assistance.

## 2021-01-27 NOTE — Plan of Care (Signed)
°  Problem: Education: Goal: Knowledge of General Education information will improve Description: Including pain rating scale, medication(s)/side effects and non-pharmacologic comfort measures Outcome: Progressing   Problem: Health Behavior/Discharge Planning: Goal: Ability to manage health-related needs will improve Outcome: Progressing   Problem: Activity: Goal: Risk for activity intolerance will decrease Outcome: Progressing   Problem: Elimination: Goal: Will not experience complications related to bowel motility Outcome: Progressing Goal: Will not experience complications related to urinary retention Outcome: Progressing

## 2021-01-27 NOTE — CV Procedure (Signed)
° °   DIRECT CURRENT CARDIOVERSION  NAME:  Nicholas [name]   MRN: [id number] DOB:  [birth year]   ADMIT DATE: 01/18/2021   INDICATIONS: Atrial tachycardia   PROCEDURE:   Informed consent was obtained prior to the procedure. The risks, benefits and alternatives for the procedure were discussed and the patient comprehended these risks. Once an appropriate time out was taken, the patient had the defibrillator pads placed in the anterior and posterior position. The patient then underwent sedation by the anesthesia service. Once an appropriate level of sedation was achieved, the patient received a single biphasic, synchronized 150J shock with prompt conversion to sinus rhythm. No apparent complications.  Glori Bickers, MD  1:10 PM

## 2021-01-27 NOTE — H&P (View-Only) (Signed)
Advanced Heart Failure Rounding Note  PCP-Cardiologist: Shirlee More, MD   Subjective:    1/17 IV lasix switched to po 1/18 Held diuretics  Scr 2.78>>2.61>>2.73>>2.4>>2.3  BUN 88>>94>>107 >>99>>96  WBC 16>>17>>22>>27>>20 K. Remains AF. Completed steroids 1/18   Remains in AF. Going for DCCV today.  No complaints. Remains fatigued. Able to ambulate about 250 ft with PT yesterday.   Denies dyspnea or CP  Objective:   Weight Range: 78.6 kg Body mass index is 25.59 kg/m.   Vital Signs:   Temp:  [97.5 F (36.4 C)-97.9 F (36.6 C)] 97.8 F (36.6 C) (01/19 0742) Pulse Rate:  [98-113] 98 (01/19 0742) Resp:  [17-20] 17 (01/19 0742) BP: (106-113)/(72-81) 109/72 (01/19 0742) SpO2:  [90 %-97 %] 95 % (01/19 0742) Weight:  [78.6 kg] 78.6 kg (01/19 0310) Last BM Date: 01/26/21  Weight change: Filed Weights   01/25/21 0412 01/26/21 0540 01/27/21 0310  Weight: 80.1 kg 79.3 kg 78.6 kg    Intake/Output:   Intake/Output Summary (Last 24 hours) at 01/27/2021 0805 Last data filed at 01/27/2021 0310 Gross per 24 hour  Intake 680 ml  Output 900 ml  Net -220 ml      Physical Exam  General:  No distress. Lying comfortably in bed HEENT: normal Neck: supple. no JVD. Carotids 2+ bilat; no bruits. No lymphadenopathy or thryomegaly appreciated. Cor: PMI nondisplaced. Irregular rhythm. No rubs, gallops or murmurs. Lungs: clear, O2 sats stable on 2L [place] Abdomen: soft, nontender, nondistended. No hepatosplenomegaly. No bruits or masses. Good bowel sounds. Extremities: no cyanosis, clubbing, rash, edema Neuro: alert & orientedx3, cranial nerves grossly intact. moves all 4 extremities w/o difficulty. Affect pleasant     Telemetry  AF 90s-100s  EKG    N/A   Labs    CBC Recent Labs    01/26/21 0055 01/27/21 0101  WBC 27.7* 19.9*  NEUTROABS 21.6* 14.3*  HGB 12.5* 11.7*  HCT 39.3 37.8*  MCV 71.1* 72.0*  PLT 492* 283   Basic Metabolic Panel Recent Labs     01/25/21 0033 01/26/21 0055 01/27/21 0101  NA 131* 132* 132*  K 4.5 4.7 4.9  CL 92* 93* 95*  CO2 29 27 27   GLUCOSE 123* 111* 110*  BUN 107* 99* 96*  CREATININE 2.73* 2.39* 2.29*  CALCIUM 8.5* 8.8* 8.7*  MG 2.5*  --   --    Liver Function Tests No results for input(s): AST, ALT, ALKPHOS, BILITOT, PROT, ALBUMIN in the last 72 hours. No results for input(s): LIPASE, AMYLASE in the last 72 hours. Cardiac Enzymes No results for input(s): CKTOTAL, CKMB, CKMBINDEX, TROPONINI in the last 72 hours.  BNP: BNP (last 3 results) Recent Labs    12/24/20 0941 12/26/20 0404 01/18/21 1655  BNP 1,084.1* 1,213.1* 2,417.5*    ProBNP (last 3 results) Recent Labs    10/15/20 0841 10/27/20 0936 12/15/20 1127  PROBNP 5,768* 6,081* 18,617*     D-Dimer No results for input(s): DDIMER in the last 72 hours. Hemoglobin A1C No results for input(s): HGBA1C in the last 72 hours. Fasting Lipid Panel No results for input(s): CHOL, HDL, LDLCALC, TRIG, CHOLHDL, LDLDIRECT in the last 72 hours. Thyroid Function Tests No results for input(s): TSH, T4TOTAL, T3FREE, THYROIDAB in the last 72 hours.  Invalid input(s): FREET3  Other results:   Imaging    DG Chest 2 View  Result Date: 01/26/2021 CLINICAL DATA:  Dyspnea EXAM: CHEST - 2 VIEW COMPARISON:  Chest radiograph 01/24/2021 FINDINGS: The heart is enlarged,  unchanged. The upper mediastinal contours are stable. There are trace bilateral pleural effusions with mild atelectasis in the right base, not significantly changed since the most recent prior study but slightly improved since 01/18/2021. There is no new or worsening focal airspace disease. There is no pulmonary edema. There is no pneumothorax. The bones are stable, including partially imaged right shoulder arthroplasty hardware with slight superior migration of the humeral head component. IMPRESSION: 1. Trace bilateral pleural effusions with mild atelectasis in the right base, not  significantly changed since the most recent prior study but slightly improved since 01/18/2021. 2. Unchanged cardiomegaly. Electronically Signed   By: Valetta Mole M.D.   On: 01/26/2021 10:52     Medications:     Scheduled Medications:  allopurinol  100 mg Oral Daily   amiodarone  400 mg Oral BID   apixaban  2.5 mg Oral BID   dextromethorphan-guaiFENesin  1 tablet Oral BID   feeding supplement  237 mL Oral TID BM   ipratropium-albuterol  3 mL Nebulization BID   latanoprost  1 drop Both Eyes QHS   metoprolol succinate  25 mg Oral Daily   mometasone-formoterol  2 puff Inhalation BID   multivitamin with minerals  1 tablet Oral q morning   pantoprazole  20 mg Oral Daily   polyethylene glycol  17 g Oral Daily   senna-docusate  2 tablet Oral BID    Infusions:  ferumoxytol      PRN Medications: acetaminophen, guaiFENesin-dextromethorphan, ipratropium-albuterol    Patient Profile   Nicholas [name] is a 88 year old with history of CKD Stage IIIB, A flutter, HFmEF, HTN, TIA, IDA, and gout. He has not had an ischemic work up.     Admitted with acute hypoxic respiratory failure, AECOPD + A/C HF.   Assessment/Plan  A/C HFmEF -NICM suspect tachy-mediated but he has not had an ischemic work up. Could consider myoview.  - Echo 01/20/21 25-30%---> 12/2020 EF 40-45%  - Diuresed with IV lasix. Overall weight down 14 - Volume status stable. Hold diuretics.   - Continue Toprol XL 25 mg daily  - No ACE/ARB/ARNI- with elevated creatinine/K upper normal - No MRA with elevated creatinine, K upper normal  - SGLT2i- Could consider jardiance but with CKD Stage IV hold off.  - Renal function stable.    2. PAF -S/P DC-CV 12/2020 --> NSR - Went back in A fib and had another DC_CV 01/15/20--> SR - Went back in A fib 1/13.  Atrial Tach. - Continue amio 400 mg twice a day + Toprol XL 25 mg daily. - Continue eliquis 2.5 mg twice a day.  ? Watchman would be a consideration.  - Going for DCCV this  afternoon   3. Acute Hypoxic Respiratory Failure in the setting of AECOPD - Given steroids, doxycycline + tamiflu at Urgent Care on 1/9  - On admit started on steroid taper and bronchodilators with improvement.  - Now down to 2 liters with stable O2 sats. He is not on oxygen at home. - Check home O2 needs - Afebrile.  - WBC elevated but completed steroids 1/18, now trending back down - CXR 01/18 stable   4. HTN  - BP Stable.    5. CKD Stage IV -Creatinine baseline 2.4-2.6 -Creatinine trending back down 2.7>2.3  - BUN trending down 107>99>96. Hold diuretics.  - Followed by Dr Justin Mend   6. IDA -He was seen by Hematology 11/04/20 for anemia.  - Iron Sats 4% 10/22. Had Iron transfusions x2 -  1/15 Iron sats 11%. Ferritin 211. - Received feraheme 01/16 - Hgb stable 11.7   - Recommend GI follow up.    7. DNR  -Palliative Care following.  -He was referred to outpatient Palliative    Need HH PT/OT at discharge. Consult TOC. Continue to assess O2 needs.   Length of Stay: 9  FINCH, LINDSAY N, PA-C  01/27/2021, 8:05 AM  Advanced Heart Failure Team Pager [phone] (M-F; 7a - 5p)  Please contact [facility] for night-coverage after hours (5p -7a ) and weekends on [email or web address]   Patient seen and examined with the above-signed Advanced Practice Provider and/or Housestaff. I personally reviewed laboratory data, imaging studies and relevant notes. I independently examined the patient and formulated the important aspects of the plan. I have edited the note to reflect any of my changes or salient points. I have personally discussed the plan with the patient and/or family.  Breathing better. WBC improving off steroids. Still in AFF/Atach on amio and Eliquis.   General:  Weak appearing. No resp difficulty HEENT: normal Neck: supple. no JVD. Carotids 2+ bilat; no bruits. No lymphadenopathy or thryomegaly appreciated. Cor: PMI nondisplaced. Irregular rate & rhythm. No rubs, gallops or  murmurs. Lungs: coarse  Abdomen: soft, nontender, nondistended. No hepatosplenomegaly. No bruits or masses. Good bowel sounds. Extremities: no cyanosis, clubbing, rash, edema Neuro: alert & orientedx3, cranial nerves grossly intact. moves all 4 extremities w/o difficulty. Affect pleasant  Respiratory status improved. Will proceed with DC-CV today. Given severity of lung disease hopefully can cut amio back soon.   Glori Bickers, MD  8:48 AM

## 2021-01-27 NOTE — Interval H&P Note (Signed)
History and Physical Interval Note:  01/27/2021 12:50 PM  Nicholas [name]  has presented today for surgery, with the diagnosis of afib.  The various methods of treatment have been discussed with the patient and family. After consideration of risks, benefits and other options for treatment, the patient has consented to  Procedure(s): CARDIOVERSION (N/A) as a surgical intervention.  The patient's history has been reviewed, patient examined, no change in status, stable for surgery.  I have reviewed the patient's chart and labs.  Questions were answered to the patient's satisfaction.     Eulalio Reamy

## 2021-01-27 NOTE — Progress Notes (Signed)
Mobility Specialist Progress Note    01/27/21 1102  Mobility  Activity Ambulated with assistance in hallway  Level of Assistance Standby assist, set-up cues, supervision of patient - no hands on  Assistive Device Four wheel walker  Distance Ambulated (ft) 350 ft  Activity Response Tolerated fair  $Mobility charge 1 Mobility   Pre-Mobility: 88 HR, 92/55 BP, 92% SpO2 During Mobility: 111 HR, 88% SpO2 Post-Mobility: 98 HR, 96% SpO2  Pt received in bed and agreeable. No complaints on walk. Ambulated on 2LO2 and desatted as low as 88%. Pt SOB with exertion. Returned to bed with call bell in reach and daughter present. On 1.5LO2 in room.   [facility] Mobility Specialist  M.S. 2C and 6E: [phone] M.S. 4E: [phone]

## 2021-01-28 ENCOUNTER — Encounter (HOSPITAL_COMMUNITY): Payer: Self-pay | Admitting: Internal Medicine

## 2021-01-28 DIAGNOSIS — I482 Chronic atrial fibrillation, unspecified: Secondary | ICD-10-CM | POA: Diagnosis not present

## 2021-01-28 LAB — BASIC METABOLIC PANEL
Anion gap: 10 (ref 5–15)
BUN: 86 mg/dL — ABNORMAL HIGH (ref 8–23)
CO2: 24 mmol/L (ref 22–32)
Calcium: 8.8 mg/dL — ABNORMAL LOW (ref 8.9–10.3)
Chloride: 99 mmol/L (ref 98–111)
Creatinine, Ser: 2.18 mg/dL — ABNORMAL HIGH (ref 0.61–1.24)
GFR, Estimated: 29 mL/min — ABNORMAL LOW (ref >60)
Glucose, Bld: 128 mg/dL — ABNORMAL HIGH (ref 70–99)
Potassium: 5 mmol/L (ref 3.5–5.1)
Sodium: 133 mmol/L — ABNORMAL LOW (ref 135–145)

## 2021-01-28 LAB — CBC WITH DIFFERENTIAL/PLATELET
Abs Immature Granulocytes: 0 10*3/uL (ref 0.00–0.07)
Basophils Absolute: 0 10*3/uL (ref 0.0–0.1)
Basophils Relative: 0 %
Eosinophils Absolute: 0.2 10*3/uL (ref 0.0–0.5)
Eosinophils Relative: 1 %
HCT: 38.6 % — ABNORMAL LOW (ref 39.0–52.0)
Hemoglobin: 11.8 g/dL — ABNORMAL LOW (ref 13.0–17.0)
Lymphocytes Relative: 17 %
Lymphs Abs: 3 10*3/uL (ref 0.7–4.0)
MCH: 22.5 pg — ABNORMAL LOW (ref 26.0–34.0)
MCHC: 30.6 g/dL (ref 30.0–36.0)
MCV: 73.7 fL — ABNORMAL LOW (ref 80.0–100.0)
Monocytes Absolute: 1.2 10*3/uL — ABNORMAL HIGH (ref 0.1–1.0)
Monocytes Relative: 7 %
Neutro Abs: 13.4 10*3/uL — ABNORMAL HIGH (ref 1.7–7.7)
Neutrophils Relative %: 75 %
Platelets: 343 10*3/uL (ref 150–400)
RBC: 5.24 MIL/uL (ref 4.22–5.81)
RDW: 20.8 % — ABNORMAL HIGH (ref 11.5–15.5)
WBC: 17.8 10*3/uL — ABNORMAL HIGH (ref 4.0–10.5)
nRBC: 0 % (ref 0.0–0.2)
nRBC: 0 /100 WBC

## 2021-01-28 MED ORDER — AMIODARONE HCL 200 MG PO TABS: 200.0000 mg | ORAL_TABLET | Freq: Two times a day (BID) | ORAL | 1 refills | Status: DC

## 2021-01-28 MED ORDER — FUROSEMIDE 40 MG PO TABS: 40.0000 mg | ORAL_TABLET | ORAL | 0 refills | Status: DC

## 2021-01-28 MED ORDER — POLYETHYLENE GLYCOL 3350 17 G PO PACK: 17.0000 g | PACK | Freq: Every day | ORAL | 0 refills | Status: DC

## 2021-01-28 NOTE — Progress Notes (Signed)
SATURATION QUALIFICATIONS: (This note is used to comply with regulatory documentation for home oxygen)  Patient Saturations on Room Air at Rest = 93%  Patient Saturations on Room Air while Ambulating = 80%  Patient Saturations on 2 Liters of oxygen while Ambulating = 95%  Please briefly explain why patient needs home oxygen: patient desats significantly with ambulation. Patient walked 321ft.

## 2021-01-28 NOTE — Plan of Care (Signed)

## 2021-01-28 NOTE — Discharge Summary (Addendum)
Discharge Summary  Nicholas [name] VZD:[id number] DOB: [birth year]  PCP: Emmaline Kluver, MD  Admit date: 01/18/2021 Discharge date: 01/28/2021  Time spent: 40 mins  Recommendations for Outpatient Follow-up:  Follow-up with cardiology as scheduled Follow-up with PCP in 1 week  Discharge Diagnoses:  Active Hospital Problems   Diagnosis Date Noted   COPD exacerbation ([place]) 01/18/2021   Influenza B 01/19/2021   A-fib ([place]) 12/24/2020   Acute combined systolic and diastolic congestive heart failure ([place]) 12/24/2020   CKD (chronic kidney disease), stage IV ([place]) 11/27/2017   HTN (hypertension) 11/01/2017    Resolved Hospital Problems  No resolved problems to display.    Discharge Condition: Stable  Diet recommendation: Heart healthy  Vitals:   01/28/21 0954 01/28/21 1144  BP: 110/65 124/68  Pulse: 75 77  Resp: 16 16  Temp: 97.8 F (36.6 C) 97.8 F (36.6 C)  SpO2: 97% 97%    History of present illness:  88 year old male past medical history of chronic systolic/diastolic heart failure, paroxysmal atrial fibrillation and stage IIIb chronic kidney disease who underwent cardioversion of atrial fibrillation on 1/5 and then since discharge, patient developed wheezing and shortness of breath.  He tested positive for the flu on 1/9, but presented to the emergency room on 1/10 despite steroids, doxycycline and Tamiflu.  Admitted for acute COPD exacerbation and noted to be hypoxic.  Hospital course complicated by recurrent atrial fibrillation and CHF.  Patient has responded to Lasix and steroids.  Ejection fraction noted at 25-30% and grade 1 diastolic dysfunction which look to be worse from echo from October of last year.  Cardiology consulted who recommended repeat cardioversion done on 1/19 which was successful.  Patient has been further stabilized and ready for discharge from cardiology standpoint.    Today, saw patient with daughter at bedside, denies any new complaints.   Daughter reports improvement from admission.  Denies any chest pain, worsening shortness of breath, abdominal pain, nausea/vomiting, fever/chills.  Patient to follow-up with cardiology and PCP as scheduled.  Home health PT and RN arranged for patient will be living with his nephew.    Hospital Course:  Principal Problem:   COPD exacerbation ([place]) Active Problems:   HTN (hypertension)   CKD (chronic kidney disease), stage IV (HCC)   Acute combined systolic and diastolic congestive heart failure (HCC)   A-fib (HCC)   Influenza B   Paroxysmal A-fib ([place]) Assessment & Plan S/p successful cardioversion on 1/19 Cardiology recommends amiodarone and Eliquis, recommend stopping beta-blocker Follow-up with cardiology   Acute combined systolic and diastolic congestive heart failure (HCC) Assessment & Plan Appears euvolemic  Down 13 pounds with IV Lasix Continue Lasix M/W/F, no ACE due to CKD stage IV, discontinue beta-blocker as per cardiology   HTN (hypertension) Assessment & Plan Blood pressure stable.  Continue medications.   Influenza B Assessment & Plan Improved, completed course of Tamiflu.   * COPD exacerbation (HCC) Assessment & Plan Stable, down to 2 L nasal cannula, will DC with oxygen due to new requirements Completed prednisone Continue bronchodilators, nebulizers as needed  Acute hypoxic respiratory failure Likely 2/2 COPD exacerbation in addition to influenza B infection Completed steroids, Tamiflu Plan to DC with 2 L of O2 which will be new for patient Follow-up with PCP  Leukocytosis Currently afebrile, just recently completed steroids, trending down Likely reactive, no other signs of infection Follow-up with PCP   Stage IV chronic kidney disease (CKD) ([place]) Assessment & Plan Renal function stable Follow-up with PCP  Goals of care discussion Patient is DNR Outpatient palliative/hospice follow-up     Malnutrition Type:  Nutrition Problem: Moderate  Malnutrition Etiology: chronic illness (CHF, CKD, COPD)   Malnutrition Characteristics:  Signs/Symptoms: mild fat depletion, moderate muscle depletion   Nutrition Interventions:  Interventions: Ensure Enlive (each supplement provides 350kcal and 20 grams of protein), Liberalize Diet, MVI   Estimated body mass index is 25.69 kg/m as calculated from the following:   Height as of this encounter: 5\' 9"  (1.753 m).   Weight as of this encounter: 78.9 kg.    Procedures: Cardioversion 01/27/2021  Consultations: Cardiology  Discharge Exam: BP 124/68    Pulse 77    Temp 97.8 F (36.6 C) (Oral)    Resp 16    Ht 5\' 9"  (1.753 m)    Wt 78.9 kg    SpO2 97%    BMI 25.69 kg/m   General: NAD  Cardiovascular: S1, S2 present Respiratory: CTAB, very mild bilateral expiratory wheezing noted Abdomen: Soft, nontender, nondistended, bowel sounds present Musculoskeletal: No bilateral pedal edema noted Skin: Normal Psychiatry: Normal mood   Discharge Instructions You were cared for by a hospitalist during your hospital stay. If you have any questions about your discharge medications or the care you received while you were in the hospital after you are discharged, you can call the unit and asked to speak with the hospitalist on call if the hospitalist that took care of you is not available. Once you are discharged, your primary care physician will handle any further medical issues. Please note that NO REFILLS for any discharge medications will be authorized once you are discharged, as it is imperative that you return to your primary care physician (or establish a relationship with a primary care physician if you do not have one) for your aftercare needs so that they can reassess your need for medications and monitor your lab values.   Allergies as of 01/28/2021       Reactions   Penicillins Hives   Childhood allergy Has patient had a PCN reaction causing immediate rash, facial/tongue/throat  swelling, SOB or lightheadedness with hypotension: Yes Has patient had a PCN reaction causing severe rash involving mucus membranes or skin necrosis: Yes Has patient had a PCN reaction that required hospitalization: Yes Has patient had a PCN reaction occurring within the last 10 years: No If all of the above answers are "NO", then may proceed with Cephalosporin use.        Medication List     STOP taking these medications    amLODipine 5 MG tablet Commonly known as: NORVASC   doxycycline 100 MG capsule Commonly known as: VIBRAMYCIN   metoprolol succinate 25 MG 24 hr tablet Commonly known as: TOPROL-XL   oseltamivir 75 MG capsule Commonly known as: TAMIFLU       TAKE these medications    acetaminophen 500 MG tablet Commonly known as: TYLENOL Take 500-1,000 mg by mouth every 8 (eight) hours as needed for mild pain or headache.   albuterol 108 (90 Base) MCG/ACT inhaler Commonly known as: VENTOLIN HFA Inhale 2 puffs into the lungs every 4 (four) hours as needed for wheezing or shortness of breath.   albuterol (2.5 MG/3ML) 0.083% nebulizer solution Commonly known as: PROVENTIL Take 2.5 mg by nebulization every 4 (four) hours as needed for wheezing or shortness of breath.   allopurinol 100 MG tablet Commonly known as: ZYLOPRIM Take 100 mg by mouth daily.   amiodarone 200 MG tablet Commonly known as:  PACERONE Take 1 tablet (200 mg total) by mouth 2 (two) times daily. What changed: how much to take   apixaban 2.5 MG Tabs tablet Commonly known as: ELIQUIS Take 1 tablet (2.5 mg total) by mouth 2 (two) times daily.   CALCIUM 600+D3 PO Take 2 tablets by mouth daily.   Ensure Take 237 mLs by mouth daily.   Fish Oil 1000 MG Caps Take 1,000 mg by mouth daily.   Fluticasone-Salmeterol 250-50 MCG/DOSE Aepb Commonly known as: ADVAIR Inhale 1 puff into the lungs 2 (two) times daily.   furosemide 40 MG tablet Commonly known as: Lasix Take 1 tablet (40 mg total) by  mouth every Monday, Wednesday, and Friday. What changed:  medication strength See the new instructions.   latanoprost 0.005 % ophthalmic solution Commonly known as: XALATAN Place 1 drop into both eyes at bedtime.   multivitamin with minerals Tabs tablet Take 1 tablet by mouth every morning.   pantoprazole 20 MG tablet Commonly known as: Protonix Take 1 tablet (20 mg total) by mouth daily.   polyethylene glycol 17 g packet Commonly known as: MIRALAX / GLYCOLAX Take 17 g by mouth daily. Start taking on: January 29, 2021   promethazine-dextromethorphan 6.25-15 MG/5ML syrup Commonly known as: PROMETHAZINE-DM Take 5 mLs by mouth See admin instructions. Every 4 to 6 hours as needed for cough               Durable Medical Equipment  (From admission, onward)           Start     Ordered   01/28/21 1317  For home use only DME Shower stool  Once       Comments: Shower chair with back   01/28/21 1316   01/28/21 1242  For home use only DME 4 wheeled rolling walker with seat  Once       Question:  Patient needs a walker to treat with the following condition  Answer:  CHF (congestive heart failure) ([place])   01/28/21 1241   01/28/21 1242  For home use only DME oxygen  Once       Question Answer Comment  Length of Need Lifetime   Mode or (Route) Nasal cannula   Liters per Minute 2   Frequency Continuous (stationary and portable oxygen unit needed)   Oxygen delivery system Gas      01/28/21 1241   01/28/21 1242  For home use only DME Tub bench  Once        01/28/21 1241   01/27/21 0820  Heart failure home health orders  (Heart failure home health orders / Face to face)  Once       Comments: Heart Failure Follow-up Care:  Verify follow-up appointments per Patient Discharge Instructions. Confirm transportation arranged. Reconcile home medications with discharge medication list. Remove discontinued medications from use. Assist patient/caregiver to manage medications using pill  box. Reinforce low sodium food selection Assessments: Vital signs and oxygen saturation at each visit. Assess home environment for safety concerns, caregiver support and availability of low-sodium foods. Consult Education officer, museum, PT/OT, Dietitian, and CNA based on assessments. Perform comprehensive cardiopulmonary assessment. Notify MD for any change in condition or weight gain of 3 pounds in one day or 5 pounds in one week with symptoms. Daily Weights and Symptom Monitoring: Ensure patient has access to scales. Teach patient/caregiver to weigh daily before breakfast and after voiding using same scale and record.    Teach patient/caregiver to track weight and symptoms and when  to notify Provider. Activity: Develop individualized activity plan with patient/caregiver.   Question Answer Comment  Heart Failure Follow-up Care [facility] at [phone]   Obtain the following labs Basic Metabolic Panel   Lab frequency Weekly   Fax lab results to [facility] at [phone]   Diet Low Sodium Heart Healthy   Fluid restrictions: 1800 mL Fluid   Skilled Nurse to notify MD of weight trends weekly for first 2 weeks. May fax or call: [facility] at [phone] (fax) or [phone]      01/27/21 0820           Allergies  Allergen Reactions   Penicillins Hives    Childhood allergy Has patient had a PCN reaction causing immediate rash, facial/tongue/throat swelling, SOB or lightheadedness with hypotension: Yes Has patient had a PCN reaction causing severe rash involving mucus membranes or skin necrosis: Yes Has patient had a PCN reaction that required hospitalization: Yes Has patient had a PCN reaction occurring within the last 10 years: No If all of the above answers are "NO", then may proceed with Cephalosporin use.     Follow-up Information     [facility] Follow up.   Why: Your palliative consult has been set up with [facility]. The office will  contact you with start of service information. Contact information: [address] [place] [id number] [place], [facility] Follow up.   Specialty: Home Health Services Why: [facility] RN-agency will call to arrange appt Contact information: [place] [address] [phone]         [address], [name], MD. Schedule an appointment as soon as possible for a visit in 1 week(s).   Specialty: Family Medicine Contact information: [place] [address] [phone]         Richardo Priest, MD .   Specialties: Cardiology, Radiology Contact information: [address] [place] [address] [phone]                  The results of significant diagnostics from this hospitalization (including imaging, microbiology, ancillary and laboratory) are listed below for reference.    Significant Diagnostic Studies: DG Chest 2 View  Result Date: 01/26/2021 CLINICAL DATA:  Dyspnea EXAM: CHEST - 2 VIEW COMPARISON:  Chest radiograph 01/24/2021 FINDINGS: The heart is enlarged, unchanged. The upper mediastinal contours are stable. There are trace bilateral pleural effusions with mild atelectasis in the right base, not significantly changed since the most recent prior study but slightly improved since 01/18/2021. There is no new or worsening focal airspace disease. There is no pulmonary edema. There is no pneumothorax. The bones are stable, including partially imaged right shoulder arthroplasty hardware with slight superior migration of the humeral head component. IMPRESSION: 1. Trace bilateral pleural effusions with mild atelectasis in the right base, not significantly changed since the most recent prior study but slightly improved since 01/18/2021. 2. Unchanged cardiomegaly. Electronically Signed   By: Valetta Mole M.D.   On: 01/26/2021 10:52   DG CHEST PORT 1  VIEW  Result Date: 01/24/2021 CLINICAL DATA:  Follow-up exam.  Shortness of breath. EXAM: PORTABLE CHEST 1 VIEW COMPARISON:  01/18/2021 FINDINGS: Heart is markedly enlarged, similar to the prior study. There is probable small RIGHT-sided pleural effusion and subsegmental atelectasis in the RIGHT LOWER lobe. Otherwise lungs are clear. No pulmonary  edema. Note is made of atherosclerosis of the thoracic aorta. Prior RIGHT shoulder arthroplasty. There is superior subluxation of the humeral head component. Appearance is similar to prior. IMPRESSION: Stable cardiomegaly. Similar RIGHT LOWER lobe atelectasis and probable small effusion. Electronically Signed   By: Nolon Nations M.D.   On: 01/24/2021 13:52   DG Chest Port 1 View  Result Date: 01/18/2021 CLINICAL DATA:  Shortness of breath EXAM: PORTABLE CHEST 1 VIEW COMPARISON:  Chest x-ray dated December 24, 2020 FINDINGS: Possible increased cardiomegaly. Small right pleural effusion and mild bibasilar opacities. No evidence of pneumothorax. IMPRESSION: 1. Possible increased cardiomegaly, concerning for pericardial effusion. Recommend echocardiography for further evaluation. 2. Small right pleural effusion and mild bibasilar opacities which likely represent atelectasis. Electronically Signed   By: Yetta Glassman M.D.   On: 01/18/2021 17:16   ECHOCARDIOGRAM COMPLETE  Result Date: 01/20/2021    ECHOCARDIOGRAM REPORT   Patient Name:   Nicholas [name] Date of Exam: 01/20/2021 Medical Rec #:  [id number]      Height:       69.0 in Accession #:    [id number]     Weight:       187.4 lb Date of Birth:  [birth year]      BSA:          2.010 m Patient Age:    43 years       BP:           119/82 mmHg Patient Gender: M              HR:           89 bpm. Exam Location:  Inpatient Procedure: 2D Echo, Cardiac Doppler, Color Doppler and Intracardiac            Opacification Agent Indications:    Dyspnea  History:        Patient has prior history of Echocardiogram examinations.  CHF,                 TIA and COPD, Arrythmias:Atrial Fibrillation; Risk                 Factors:Hypertension.  Sonographer:    Jyl Heinz Referring Phys: Theola Sequin IMPRESSIONS  1. Left ventricular ejection fraction, by estimation, is 25 to 30%. The left ventricle has severely decreased function. The left ventricle demonstrates global hypokinesis. The left ventricular internal cavity size was mildly dilated. Left ventricular diastolic parameters are consistent with Grade I diastolic dysfunction (impaired relaxation).  2. Right ventricular systolic function is mildly reduced. The right ventricular size is normal. Tricuspid regurgitation signal is inadequate for assessing PA pressure.  3. Left atrial size was moderately dilated.  4. The mitral valve is degenerative. Mild mitral valve regurgitation. No evidence of mitral stenosis.  5. The aortic valve is tricuspid. Aortic valve regurgitation is mild. Mild to moderate aortic valve stenosis. Aortic valve area, by VTI measures 1.68 cm. Aortic valve mean gradient measures 15.0 mmHg.  6. The inferior vena cava is normal in size with greater than 50% respiratory variability, suggesting right atrial pressure of 3 mmHg.  7. Moderate pericardial effusion, primarily adjacent to the RA and RV (small elsewhere). There is no evidence of cardiac tamponade. FINDINGS  Left Ventricle: Left ventricular ejection fraction, by estimation, is 25 to 30%. The left ventricle has severely decreased function. The left ventricle demonstrates global hypokinesis. The left ventricular internal cavity size was mildly dilated. There is no left ventricular hypertrophy. Left ventricular diastolic  parameters are consistent with Grade I diastolic dysfunction (impaired relaxation). Right Ventricle: The right ventricular size is normal. No increase in right ventricular wall thickness. Right ventricular systolic function is mildly reduced. Tricuspid regurgitation signal is inadequate for assessing  PA pressure. Left Atrium: Left atrial size was moderately dilated. Right Atrium: Right atrial size was normal in size. Pericardium: A moderately sized pericardial effusion is present. There is no evidence of cardiac tamponade. Mitral Valve: The mitral valve is degenerative in appearance. There is mild calcification of the mitral valve leaflet(s). Mild to moderate mitral annular calcification. Mild mitral valve regurgitation. No evidence of mitral valve stenosis. Tricuspid Valve: The tricuspid valve is normal in structure. Tricuspid valve regurgitation is not demonstrated. Aortic Valve: The aortic valve is tricuspid. Aortic valve regurgitation is mild. Mild to moderate aortic stenosis is present. Aortic valve mean gradient measures 15.0 mmHg. Aortic valve peak gradient measures 20.2 mmHg. Aortic valve area, by VTI measures  1.68 cm. Pulmonic Valve: The pulmonic valve was normal in structure. Pulmonic valve regurgitation is not visualized. Aorta: The aortic root is normal in size and structure. Venous: The inferior vena cava is normal in size with greater than 50% respiratory variability, suggesting right atrial pressure of 3 mmHg. IAS/Shunts: No atrial level shunt detected by color flow Doppler.  LEFT VENTRICLE PLAX 2D LVIDd:         6.20 cm      Diastology LVIDs:         5.60 cm      LV e' medial:    6.64 cm/s LV PW:         0.90 cm      LV E/e' medial:  15.2 LV IVS:        1.00 cm      LV e' lateral:   3.59 cm/s LVOT diam:     2.30 cm      LV E/e' lateral: 28.1 LV SV:         82 LV SV Index:   41 LVOT Area:     4.15 cm  LV Volumes (MOD) LV vol d, MOD A2C: 240.0 ml LV vol d, MOD A4C: 221.0 ml LV vol s, MOD A2C: 161.0 ml LV vol s, MOD A4C: 150.0 ml LV SV MOD A2C:     79.0 ml LV SV MOD A4C:     221.0 ml LV SV MOD BP:      75.5 ml RIGHT VENTRICLE            IVC RV Basal diam:  3.60 cm    IVC diam: 1.80 cm RV Mid diam:    2.50 cm RV S prime:     7.62 cm/s TAPSE (M-mode): 1.8 cm LEFT ATRIUM              Index         RIGHT ATRIUM           Index LA diam:        4.70 cm  2.34 cm/m   RA Area:     17.00 cm LA Vol (A2C):   97.1 ml  48.32 ml/m  RA Volume:   42.60 ml  21.20 ml/m LA Vol (A4C):   110.0 ml 54.74 ml/m LA Biplane Vol: 108.0 ml 53.74 ml/m  AORTIC VALVE AV Area (Vmax):    1.75 cm AV Area (Vmean):   1.65 cm AV Area (VTI):     1.68 cm AV Vmax:  225.00 cm/s AV Vmean:          176.333 cm/s AV VTI:            0.490 m AV Peak Grad:      20.2 mmHg AV Mean Grad:      15.0 mmHg LVOT Vmax:         94.95 cm/s LVOT Vmean:        69.850 cm/s LVOT VTI:          0.198 m LVOT/AV VTI ratio: 0.41  AORTA Ao Root diam: 3.20 cm Ao Asc diam:  3.60 cm MITRAL VALVE MV Area (PHT): 6.17 cm     SHUNTS MV Decel Time: 123 msec     Systemic VTI:  0.20 m MR Peak grad: 91.0 mmHg     Systemic Diam: 2.30 cm MR Mean grad: 58.0 mmHg MR Vmax:      477.00 cm/s MR Vmean:     369.0 cm/s MV E velocity: 101.00 cm/s MV A velocity: 107.00 cm/s MV E/A ratio:  0.94 Dalton McleanMD Electronically signed by Franki Monte Signature Date/Time: 01/20/2021/7:49:59 PM    Final     Microbiology: Recent Results (from the past 240 hour(s))  Resp Panel by RT-PCR (Flu A&B, Covid) Nasopharyngeal Swab     Status: None   Collection Time: 01/18/21  5:00 PM   Specimen: Nasopharyngeal Swab; Nasopharyngeal(NP) swabs in vial transport medium  Result Value Ref Range Status   SARS Coronavirus 2 by RT PCR NEGATIVE NEGATIVE Final    Comment: (NOTE) SARS-CoV-2 target nucleic acids are NOT DETECTED.  The SARS-CoV-2 RNA is generally detectable in upper respiratory specimens during the acute phase of infection. The lowest concentration of SARS-CoV-2 viral copies this assay can detect is 138 copies/mL. A negative result does not preclude SARS-Cov-2 infection and should not be used as the sole basis for treatment or other patient management decisions. A negative result may occur with  improper specimen collection/handling, submission of specimen other than  nasopharyngeal swab, presence of viral mutation(s) within the areas targeted by this assay, and inadequate number of viral copies(<138 copies/mL). A negative result must be combined with clinical observations, patient history, and epidemiological information. The expected result is Negative.  Fact Sheet for Patients:  EntrepreneurPulse.com.au  Fact Sheet for Healthcare Providers:  IncredibleEmployment.be  This test is no t yet approved or cleared by the [place] FDA and  has been authorized for detection and/or diagnosis of SARS-CoV-2 by FDA under an Emergency Use Authorization (EUA). This EUA will remain  in effect (meaning this test can be used) for the duration of the COVID-19 declaration under Section 564(b)(1) of the Act, 21 U.S.C.section 360bbb-3(b)(1), unless the authorization is terminated  or revoked sooner.       Influenza A by PCR NEGATIVE NEGATIVE Final   Influenza B by PCR NEGATIVE NEGATIVE Final    Comment: (NOTE) The Xpert Xpress SARS-CoV-2/FLU/RSV plus assay is intended as an aid in the diagnosis of influenza from Nasopharyngeal swab specimens and should not be used as a sole basis for treatment. Nasal washings and aspirates are unacceptable for Xpert Xpress SARS-CoV-2/FLU/RSV testing.  Fact Sheet for Patients: EntrepreneurPulse.com.au  Fact Sheet for Healthcare Providers: IncredibleEmployment.be  This test is not yet approved or cleared by the [place] FDA and has been authorized for detection and/or diagnosis of SARS-CoV-2 by FDA under an Emergency Use Authorization (EUA). This EUA will remain in effect (meaning this test can be used) for the duration of the COVID-19 declaration  under Section 564(b)(1) of the Act, 21 U.S.C. section 360bbb-3(b)(1), unless the authorization is terminated or revoked.  Performed at [facility], [address] [place], [place] [address]   MRSA Next Gen by PCR, Nasal     Status: None   Collection Time: 01/18/21 11:40 PM   Specimen: Nasal Mucosa; Nasal Swab  Result Value Ref Range Status   MRSA by PCR Next Gen NOT DETECTED NOT DETECTED Final    Comment: (NOTE) The GeneXpert MRSA Assay (FDA approved for NASAL specimens only), is one component of a comprehensive MRSA colonization surveillance program. It is not intended to diagnose MRSA infection nor to guide or monitor treatment for MRSA infections. Test performance is not FDA approved in patients less than 6 years old. Performed at [facility], [place] [address] [place], [place] [address]      Labs: Basic Metabolic Panel: Recent Labs  Lab 01/24/21 0046 01/25/21 0033 01/26/21 0055 01/27/21 0101 01/28/21 0047  NA 132* 131* 132* 132* 133*  K 4.4 4.5 4.7 4.9 5.0  CL 92* 92* 93* 95* 99  CO2 26 29 27 27 24   GLUCOSE 123* 123* 111* 110* 128*  BUN 94* 107* 99* 96* 86*  CREATININE 2.61* 2.73* 2.39* 2.29* 2.18*  CALCIUM 8.8* 8.5* 8.8* 8.7* 8.8*  MG  --  2.5*  --   --   --    Liver Function Tests: No results for input(s): AST, ALT, ALKPHOS, BILITOT, PROT, ALBUMIN in the last 168 hours. No results for input(s): LIPASE, AMYLASE in the last 168 hours. No results for input(s): AMMONIA in the last 168 hours. CBC: Recent Labs  Lab 01/24/21 0046 01/25/21 0033 01/26/21 0055 01/27/21 0101 01/28/21 0047  WBC 17.0* 22.2* 27.7* 19.9* 17.8*  NEUTROABS 11.6* 13.5* 21.6* 14.3* 13.4*  HGB 12.3* 11.9* 12.5* 11.7* 11.8*  HCT 39.5 37.8* 39.3 37.8* 38.6*  MCV 71.6* 71.3* 71.1* 72.0* 73.7*  PLT 521* 485* 492* 382 343   Cardiac Enzymes: No results for input(s): CKTOTAL, CKMB, CKMBINDEX, TROPONINI in the last 168 hours. BNP: BNP (last 3 results) Recent Labs    12/24/20 0941 12/26/20 0404 01/18/21 1655  BNP 1,084.1* 1,213.1* 2,417.5*    ProBNP (last 3 results) Recent Labs    10/15/20 0841 10/27/20 0936 12/15/20 1127  PROBNP  5,768* 6,081* 18,617*    CBG: No results for input(s): GLUCAP in the last 168 hours.     Signed:  Alma Friendly, MD [facility] 01/28/2021, 3:21 PM

## 2021-01-28 NOTE — Progress Notes (Signed)
Occupational Therapy Treatment and Discharge Patient Details Name: Nicholas [name] MRN: [id number] DOB: [birth year] Today's Date: 01/28/2021   History of present illness Pt is an 88 y.o. male admitted 01/18/21 with SOB, wheezing; pt reports testing (+) influenza B on 1/9, but tested (-) on admission. Workup for COPD exacerbation, CHF, potential influenza B. Course complicated by afib; had successful DC-CV 1/19. PMH includes CHF, PAF, CKD; of note, recent admission 12/24/20 s/p cardioversion for afib.   OT comments  This 88 yo male admitted with above presents to acute OT today with all acute OT education completed and pt is to D/C today. We will D/C from acute OT.   Recommendations for follow up therapy are one component of a multi-disciplinary discharge planning process, led by the attending physician.  Recommendations may be updated based on patient status, additional functional criteria and insurance authorization.    Follow Up Recommendations  Home health OT    Assistance Recommended at Discharge Frequent or constant Supervision/Assistance  Patient can return home with the following  A little help with walking and/or transfers;A little help with bathing/dressing/bathroom;Assistance with cooking/housework;Assist for transportation   Equipment Recommendations  Tub/shower seat       Precautions / Restrictions Precautions Precautions: Fall;Other (comment) Precaution Comments: Watch SpO2 (does not wear O2 baseline); monitor HR Restrictions Weight Bearing Restrictions: No          Balance Overall balance assessment: Needs assistance Sitting-balance support: No upper extremity supported, Feet supported Sitting balance-Leahy Scale: Good     Standing balance support: No upper extremity supported, During functional activity Standing balance-Leahy Scale: Fair                             ADL either performed or assessed with clinical judgement   ADL Overall ADL's : Needs  assistance/impaired     Grooming: Min guard;Standing;Wash/dry hands           Upper Body Dressing : Set up;Sitting Upper Body Dressing Details (indicate cue type and reason): EOB for button up shirt     Toilet Transfer: Min guard;Ambulation Toilet Transfer Details (indicate cue type and reason): back from bathroom without AD (tends to furniture walk)           General ADL Comments: Pt was up to bathroom by himself with daughter in room, no O2 on. Checked O2 and he was 93% on RA. Encouraged him to wear O2 anytime he is up and about for any activity (especially while showering). I also talked to him and his daughter about using a chair or stool to sit on/prop on when at sink for his morning/evening grooming routine or any acitivty where he will be up on his feet in one place for long.    Extremity/Trunk Assessment Upper Extremity Assessment Upper Extremity Assessment: Generalized weakness            Vision Baseline Vision/History: 1 Wears glasses Ability to See in Adequate Light: 0 Adequate Patient Visual Report: No change from baseline            Cognition Arousal/Alertness: Awake/alert Behavior During Therapy: WFL for tasks assessed/performed Overall Cognitive Status: Within Functional Limits for tasks assessed                                                General  Comments Pt's daughter present and supportive, demonstrates good awareness of pt's O2 and DME needs; further educ on fall risk reduction, activity recommendations. Pt preparing for d/c home today. Notified RN of potential/likely afib with ambulation with HR 79-107    Pertinent Vitals/ Pain       Pain Assessment Pain Assessment: No/denies pain         Frequency  Min 2X/week        Progress Toward Goals  OT Goals(current goals can now be found in the care plan section)  Progress towards OT goals: Progressing toward goals  Acute Rehab OT Goals Patient Stated Goal: to go  home today OT Goal Formulation: With patient Time For Goal Achievement: 02/02/21 Potential to Achieve Goals: Good  Plan Discharge plan remains appropriate       AM-PAC OT "6 Clicks" Daily Activity     Outcome Measure   Help from another person eating meals?: None Help from another person taking care of personal grooming?: A Little Help from another person toileting, which includes using toliet, bedpan, or urinal?: A Little Help from another person bathing (including washing, rinsing, drying)?: A Little Help from another person to put on and taking off regular upper body clothing?: A Little Help from another person to put on and taking off regular lower body clothing?: A Little 6 Click Score: 19    End of Session Equipment Utilized During Treatment: Oxygen (2 liters when I left th eroom)  OT Visit Diagnosis: Unsteadiness on feet (R26.81);Muscle weakness (generalized) (M62.81)   Activity Tolerance Patient tolerated treatment well   Patient Left  (sitting EOB waiting to be discharged)   Nurse Communication          Time: 7939-0300 OT Time Calculation (min): 11 min  Charges: OT General Charges $OT Visit: 1 Visit OT Treatments $Self Care/Home Management : 8-22 mins  [place], OTR/L Acute [employer] Pager [phone] Office [phone]  Almon Register 01/28/2021, 5:23 PM

## 2021-01-28 NOTE — TOC Initial Note (Addendum)
Transition of Care [facility]) - Initial/Assessment Note    Patient Details  Name: Nicholas [name] MRN: [id number] Date of Birth: 27-Oct-1933  Transition of Care [facility]) CM/SW Contact:    Erenest Rasher, RN Phone Number: [phone] 01/28/2021, 11:33 AM  Clinical Narrative:                 HF TOC CM spoke to pt and dtr, Amanda at bedside. Dtr is requesting a shower chair. Explained shower chair is not covered by insurance. She can pay out of pocket. States she would try tub bench. Pt will need Rollator and oxygen. Attending notified. [place] with referral for DME. Offered choice for [facility]. Dtr agreeable to [facility] for [facility]. Pt lives at home with nephew. Dtr states family assist pt at home and will transport to appts. Made [facility] aware of scheduled dc today with outpt Palliative Services.   Expected Discharge Plan: [place] Barriers to Discharge: No Barriers Identified   Patient Goals and CMS Choice   CMS Medicare.gov Compare Post Acute Care list provided to:: Patient Represenative (must comment) Durenda Age) Choice offered to / list presented to : [place] / Guardian  Expected Discharge Plan and Services Expected Discharge Plan: [place] In-house Referral: Clinical Social Work Discharge Planning Services: CM Consult Post Acute Care Choice: [place] arrangements for the past 2 months: [place]                 DME Arranged: N/A DME Agency: [employer] Date DME Agency Contacted: 01/28/21 Time DME Agency Contacted: 1129 Representative spoke with at DME Agency: Melina Modena HH Arranged: RN, PT [facility] Agency: [place] Date [place]: 01/28/21 Time [place]: 1129 Representative spoke with at [place]: Adela Lank  Prior Living Arrangements/Services Living arrangements for the past 2 months: [place] with:: Adult Children (Nephew) Patient  language and need for interpreter reviewed:: Yes Do you feel safe going back to the place where you live?: Yes      Need for Family Participation in Patient Care: Yes (Comment) Care giver support system in place?: Yes (comment) Current home services:  (n/a) Criminal Activity/Legal Involvement Pertinent to Current Situation/Hospitalization: No - Comment as needed  Activities of Daily Living      Permission Sought/Granted Permission sought to share information with : Case Manager, Family Supports Permission granted to share information with : Yes, Verbal Permission Granted  Share Information with NAME: Durenda Age  Permission granted to share info w AGENCY: [place], DME  Permission granted to share info w Relationship: daughter  Permission granted to share info w Contact Information: [phone]  Emotional Assessment Appearance:: Appears stated age Attitude/Demeanor/Rapport: Gracious Affect (typically observed): Accepting Orientation: : Oriented to Self, Oriented to Place, Oriented to  Time, Oriented to Situation Alcohol / Substance Use: Not Applicable Psych Involvement: No (comment)  Admission diagnosis:  CKD (chronic kidney disease) stage 4, GFR 15-29 ml/min (HCC) [N18.4] COPD exacerbation (HCC) [J44.1] Acute on chronic combined systolic and diastolic CHF (congestive heart failure) (HCC) [I50.43] Patient Active Problem List   Diagnosis Date Noted   Chronic kidney disease 01/26/2021   Influenza B 01/19/2021   COPD exacerbation ([place]) 01/18/2021   Malnutrition of moderate degree 12/25/2020   Atrial flutter with rapid ventricular response ([place]) 12/25/2020   Acute combined systolic and diastolic congestive heart failure ([place]) 12/24/2020   A-fib ([place]) 12/24/2020  Iron deficiency anemia 11/04/2020   Anemia 09/22/2020   Arthritis 09/22/2020   Asthma 09/22/2020   Family history of adverse reaction to anesthesia 09/22/2020   Glaucoma 09/22/2020   Pneumonia due to COVID-19  virus 07/19/2018   COVID-19 virus infection 07/18/2018   CKD (chronic kidney disease), stage IV ([place]) 11/27/2017   Hyperlipidemia 11/02/2017   TIA (transient ischemic attack) 11/01/2017   CHF (congestive heart failure) ([place]) 11/01/2017   HTN (hypertension) 11/01/2017   PCP:  Street, [name], MD Pharmacy:   [place], [place] [address] Phone: [phone] Fax: [phone]     Social Determinants of Health (SDOH) Interventions    Readmission Risk Interventions Readmission Risk Prevention Plan 01/21/2021  Transportation Screening Complete  PCP or Specialist Appt within 3-5 Days Not Complete  HRI or Home Care Consult Complete  Social Work Consult for [place] Planning/Counseling Complete  Palliative Care Screening Complete  Medication Review Press photographer) Referral to Pharmacy  Some recent data might be hidden

## 2021-01-28 NOTE — Progress Notes (Signed)
Advanced Heart Failure Rounding Note  PCP-Cardiologist: Shirlee More, MD   Subjective:    Underwent successful DC-CV yesterday.   Remains in NSR. Now on po amio and Eliquis   Denies SOB orthopnea or PND  Objective:   Weight Range: 78.9 kg Body mass index is 25.69 kg/m.   Vital Signs:   Temp:  [97.7 F (36.5 C)-98.2 F (36.8 C)] 97.8 F (36.6 C) (01/20 0411) Pulse Rate:  [68-110] 75 (01/20 0411) Resp:  [12-23] 16 (01/20 0411) BP: (88-155)/(37-99) 142/99 (01/20 0411) SpO2:  [90 %-98 %] 96 % (01/20 0824) Weight:  [78.6 kg-78.9 kg] 78.9 kg (01/20 0411) Last BM Date: 01/28/21  Weight change: Filed Weights   01/27/21 0310 01/27/21 1223 01/28/21 0411  Weight: 78.6 kg 78.6 kg 78.9 kg    Intake/Output:   Intake/Output Summary (Last 24 hours) at 01/28/2021 0912 Last data filed at 01/27/2021 2328 Gross per 24 hour  Intake 100 ml  Output 650 ml  Net -550 ml       Physical Exam   General:  Elderly male  No resp difficulty HEENT: normal Neck: supple. no JVD. Carotids 2+ bilat; no bruits. No lymphadenopathy or thryomegaly appreciated. Cor: PMI nondisplaced. Regular rate & rhythm. No rubs, gallops or murmurs. Lungs: coarse Abdomen: soft, nontender, nondistended. No hepatosplenomegaly. No bruits or masses. Good bowel sounds. Extremities: no cyanosis, clubbing, rash, edema Neuro: alert & orientedx3, cranial nerves grossly intact. moves all 4 extremities w/o difficulty. Affect pleasant   Telemetry   Sinus 70s Personally reviewed   EKG    N/A   Labs    CBC Recent Labs    01/27/21 0101 01/28/21 0047  WBC 19.9* 17.8*  NEUTROABS 14.3* 13.4*  HGB 11.7* 11.8*  HCT 37.8* 38.6*  MCV 72.0* 73.7*  PLT 382 470    Basic Metabolic Panel Recent Labs    01/27/21 0101 01/28/21 0047  NA 132* 133*  K 4.9 5.0  CL 95* 99  CO2 27 24  GLUCOSE 110* 128*  BUN 96* 86*  CREATININE 2.29* 2.18*  CALCIUM 8.7* 8.8*    Liver Function Tests No results for  input(s): AST, ALT, ALKPHOS, BILITOT, PROT, ALBUMIN in the last 72 hours. No results for input(s): LIPASE, AMYLASE in the last 72 hours. Cardiac Enzymes No results for input(s): CKTOTAL, CKMB, CKMBINDEX, TROPONINI in the last 72 hours.  BNP: BNP (last 3 results) Recent Labs    12/24/20 0941 12/26/20 0404 01/18/21 1655  BNP 1,084.1* 1,213.1* 2,417.5*     ProBNP (last 3 results) Recent Labs    10/15/20 0841 10/27/20 0936 12/15/20 1127  PROBNP 5,768* 6,081* 18,617*      D-Dimer No results for input(s): DDIMER in the last 72 hours. Hemoglobin A1C No results for input(s): HGBA1C in the last 72 hours. Fasting Lipid Panel No results for input(s): CHOL, HDL, LDLCALC, TRIG, CHOLHDL, LDLDIRECT in the last 72 hours. Thyroid Function Tests No results for input(s): TSH, T4TOTAL, T3FREE, THYROIDAB in the last 72 hours.  Invalid input(s): FREET3  Other results:   Imaging    No results found.   Medications:     Scheduled Medications:  allopurinol  100 mg Oral Daily   amiodarone  400 mg Oral BID   apixaban  2.5 mg Oral BID   dextromethorphan-guaiFENesin  1 tablet Oral BID   feeding supplement  237 mL Oral TID BM   ipratropium-albuterol  3 mL Nebulization BID   latanoprost  1 drop Both Eyes QHS   metoprolol  succinate  25 mg Oral Daily   mometasone-formoterol  2 puff Inhalation BID   multivitamin with minerals  1 tablet Oral q morning   pantoprazole  20 mg Oral Daily   polyethylene glycol  17 g Oral Daily   senna-docusate  2 tablet Oral BID    Infusions:    PRN Medications: acetaminophen, guaiFENesin-dextromethorphan, ipratropium-albuterol    Patient Profile   Mr Moorhouse is a 88 year old with history of CKD Stage IIIB, A flutter, HFmEF, HTN, TIA, IDA, and gout. He has not had an ischemic work up.     Admitted with acute hypoxic respiratory failure, AECOPD + A/C HF.   Assessment/Plan   1. A/C HFmEF -NICM suspect tachy-mediated but he has not had an  ischemic work up. Could consider myoview.  - Echo 01/20/21 25-30%---> 12/2020 EF 40-45%  - Diuresed with IV lasix. Overall weight down 14 - Volume status stable.  - Will restart lasix 40 po dian adjust as needed.  - Continue Toprol XL 25 mg daily  - No ACE/ARB/ARNI- with elevated creatinine/hyperkalemia - No MRA with elevated creatinine/hyperkalemia - SGLT2i- Could consider jardiance but with CKD Stage IV hold off.  - Renal function stable today   2. PAFL/atrial tach -S/P DC-CV 12/2020 --> NSR - Went back in A fib and had another DC_CV 01/15/20--> SR - Went back in A fib 1/13.  Atrial Tach. - Successful DC-CV 1/19. Hopefully will hold - Continue amio 400 mg twice a day + Toprol XL 25 mg daily. - Continue eliquis 2.5 mg twice a day.    3. Acute Hypoxic Respiratory Failure in the setting of AECOPD - Given steroids, doxycycline + tamiflu at Urgent Care on 1/9  - On admit started on steroid taper and bronchodilators  - Much improved with diuresis and pulmoary toilet - Now down to 2 liters with stable O2 sats. He is not on oxygen at home.   4. HTN  - BP Stable.    5. CKD Stage IV -Creatinine baseline 2.4-2.6 -Creatinine trending back down 2.7>2.3 > 2.2   6. IDA -He was seen by Hematology 11/04/20 for anemia.  - Iron Sats 4% 10/22. Had Iron transfusions x2 - 1/15 Iron sats 11%. Ferritin 211. - Received feraheme 01/16 - Hgb stable 11.7   - Recommend GI follow up.    7. DNR  -Palliative Care following.  -He was referred to outpatient Palliative   Ok for d/c from cardiac standpoint. Will arrange f/u in HF Clinic  Cardiac meds on d/c  Lasix 40 MWF Amio 200 bid Eliquis 2.5 bid  Stop Toprol  Arrange HF f/u   Length of Stay: [place], MD  01/28/2021, 9:12 AM  Advanced Heart Failure Team Pager [phone] (M-F; 7a - 5p)  Please contact [facility] for night-coverage after hours (5p -7a ) and weekends on [email or web address]

## 2021-01-28 NOTE — Progress Notes (Addendum)
Physical Therapy Treatment Patient Details Name: Nicholas [name] MRN: [id number] DOB: 10-15-1933 Today's Date: 01/28/2021   History of Present Illness Pt is an 88 y.o. male admitted 01/18/21 with SOB, wheezing; pt reports testing (+) influenza B on 1/9, but tested (-) on admission. Workup for COPD exacerbation, CHF, potential influenza B. Course complicated by afib; had successful DC-CV 1/19. PMH includes CHF, PAF, CKD; of note, recent admission 12/24/20 s/p cardioversion for afib.   PT Comments    Pt progressing well with mobility. Today's session focused on gait training with rollator; pt moving well with supervision for safety/lines; noted improvements in [place]. SpO2 >/94% on 2L O2 [place]; notified RN of suspected afib with ambulation with HR 79-107. Pt preparing for d/c home today; pt's daughter present for educ, including O2 needs, DME, fall risk reduction, activity recommendations.    Recommendations for follow up therapy are one component of a multi-disciplinary discharge planning process, led by the attending physician.  Recommendations may be updated based on patient status, additional functional criteria and insurance authorization.  Follow Up Recommendations  Home health PT     Assistance Recommended at Discharge Intermittent Supervision/Assistance  Patient can return home with the following Assistance with cooking/housework;Assist for transportation;A little help with bathing/dressing/bathroom;Direct supervision/assist for financial management;Direct supervision/assist for medications management   Equipment Recommendations  Rollator (4 wheels)    Recommendations for Other Services       Precautions / Restrictions Precautions Precautions: Fall;Other (comment) Precaution Comments: Watch SpO2 (does not wear O2 baseline); monitor HR Restrictions Weight Bearing Restrictions: No     Mobility  Bed Mobility Overal bed mobility: Modified Independent Bed Mobility: Supine to Sit, Sit to  Supine                Transfers Overall transfer level: Needs assistance Equipment used: None, Rollator (4 wheels) Transfers: Sit to/from Stand Sit to Stand: Supervision                Ambulation/Gait Ambulation/Gait assistance: Supervision Gait Distance (Feet): 280 Feet Assistive device: Rollator (4 wheels) Gait Pattern/deviations: Step-through pattern, Decreased stride length, Trunk flexed Gait velocity: Decreased     General Gait Details: Steady gait with rollator and supervision for safety/lines; improved stability, also noted improvements in [place]. SpO2 >/94% on 2L O2 when pleth reliable   Stairs             Wheelchair Mobility    Modified Rankin (Stroke Patients Only)       Balance Overall balance assessment: Needs assistance Sitting-balance support: No upper extremity supported, Feet supported Sitting balance-Leahy Scale: Good Sitting balance - Comments: daughter opts to assist pt in donning/doffing shoes   Standing balance support: No upper extremity supported, During functional activity Standing balance-Leahy Scale: Fair Standing balance comment: can static stand and take steps without UE support; stability improved with rollator                            Cognition Arousal/Alertness: Awake/alert Behavior During Therapy: WFL for tasks assessed/performed Overall Cognitive Status: Within Functional Limits for tasks assessed                                 General Comments: WFL for simple tasks; limited by very [facility]        Exercises      General Comments General comments (skin integrity, edema, etc.): Pt's daughter present  and supportive, demonstrates good awareness of pt's O2 and DME needs; further educ on fall risk reduction, activity recommendations. Pt preparing for d/c home today. Notified RN of potential/likely afib with ambulation with HR 79-107      Pertinent Vitals/Pain Pain Assessment Pain Assessment:  Faces Faces Pain Scale: No hurt Pain Intervention(s): Monitored during session    Home Living                          Prior Function            PT Goals (current goals can now be found in the care plan section) Acute Rehab PT Goals Patient Stated Goal: home today Progress towards PT goals: Progressing toward goals    Frequency    Min 3X/week      PT Plan Current plan remains appropriate    Co-evaluation              AM-PAC PT "6 Clicks" Mobility   Outcome Measure  Help needed turning from your back to your side while in a flat bed without using bedrails?: None Help needed moving from lying on your back to sitting on the side of a flat bed without using bedrails?: None Help needed moving to and from a bed to a chair (including a wheelchair)?: A Little Help needed standing up from a chair using your arms (e.g., wheelchair or bedside chair)?: A Little Help needed to walk in hospital room?: A Little Help needed climbing 3-5 steps with a railing? : A Little 6 Click Score: 20    End of Session Equipment Utilized During Treatment: Oxygen Activity Tolerance: Patient tolerated treatment well Patient left: in bed;with call bell/phone within reach;with family/visitor present Nurse Communication: Mobility status PT Visit Diagnosis: Other abnormalities of gait and mobility (R26.89)     Time: 1341-1355 PT Time Calculation (min) (ACUTE ONLY): 14 min  Charges:  $Self Care/Home Management: 8-22                     Mabeline Caras, PT, DPT Acute Rehabilitation Services  Pager [phone] Office [place] 01/28/2021, 2:35 PM

## 2021-01-30 NOTE — Progress Notes (Signed)
Cardiology Office Note:    Date:  01/31/2021   ID:  Nicholas [name], DOB [birth year], MRN [id number]  PCP:  Street, [name], MD  Cardiologist:  Shirlee More, MD    Referring MD: [address], [name], *    ASSESSMENT:    1. Atypical atrial flutter ([place])   2. Chronic anticoagulation   3. Chronic combined systolic and diastolic CHF (congestive heart failure) (HCC)   4. Other iron deficiency anemia   5. Chronic kidney disease (CKD), stage IV (severe) ([place])   6. Hypertensive heart disease with chronic combined systolic and diastolic congestive heart failure (HCC)    PLAN:    In order of problems listed above:  Patient is daughter obviously disappointed that he is back in atrial tachyarrhythmia however he is minimally symptomatic the rate is not particularly rapid's care is becoming palliative we will continue the amiodarone which is done a nice job controlling rate is reduced dose anticoagulant with his kidney disease and I would not advise any further attempts to resume sinus rhythm His heart failure is decompensated he is hypovolemic will allow his weight to drift up 5 pounds and take his diuretic as needed for weight of 177 or greater on Monday Wednesdays and Fridays Improved anemia last hemoglobin 11.8 as arranges follow-up with Dr. Bobby Rumpf hematology regarding parenteral iron Stable CKD from lab work from May stage IV   Next appointment: 6 weeks   Medication Adjustments/Labs and Tests Ordered: Current medicines are reviewed at length with the patient today.  Concerns regarding medicines are outlined above.  Orders Placed This Encounter  Procedures   EKG 12-Lead   No orders of the defined types were placed in this encounter.   Chief Complaint  Patient presents with   Follow-up   Atrial Flutter    History of Present Illness:    Nicholas [name] is a 88 y.o. male with a hx of persistent atypical atrial flutter stage IV CKD hypertension and combined systolic and  diastolic heart failure right bundle branch block and iron deficiency anemia last seen by me 12/15/2020 with recommendation for cardioversion.. Compliance with diet, lifestyle and medications: Yes he is well supervised by his daughter  He was successfully cardioverted to sinus rhythm by the advanced heart failure team Dr. Haroldine Laws 01/28/2021.  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  Because of kidney dysfunction hyperkalemia could not receive ACE ARB or Entresto or MRA or SGLT2 inhibitor.  He also had decompensated COPD he was seen by hematology for anemia and received parenteral iron last hemoglobin 11.7 and he arranged for palliative care consult.  He was much weaker as a consequence of these multiple hospitalizations but he is not short of breath and oxygen no chest pain edema palpitation or syncope.  Today he is in either atrial tachycardia or atypical atrial flutter 2 1 block rate controlled at 106 bpm. Told his wife I do not think we should try any more attempts to resume sinus rhythm but I would continue the amiodarone which was controlled his rate. He appears over diuresed will worsen I do is hold his diuretic unless his weight is up 5 pounds at 177 pounds he is down close to 35 pounds total with these admissions to the hospital He is comfortable plan oxygen and he is palliative care and if he deteriorates he can transition to hospice. He has an appointment to follow-up  in the advanced heart failure program Friday told him to keep it and I will let Dr. Haroldine Laws make a decision whether he comes back therapy to see me in my office. Past Medical History:  Diagnosis Date   Anemia    none recent   Arthritis    Asthma    Chronic kidney disease    ckd stage 3   Family history of adverse reaction to anesthesia    daughter has ponv    Glaucoma    Hypertension     Past  Surgical History:  Procedure Laterality Date   CARDIOVERSION N/A 12/30/2020   Procedure: CARDIOVERSION;  Surgeon: Jerline Pain, MD;  Location: [facility] ENDOSCOPY;  Service: Cardiovascular;  Laterality: N/A;   CARDIOVERSION N/A 01/14/2021   Procedure: CARDIOVERSION;  Surgeon: Jolaine Artist, MD;  Location: [facility] ENDOSCOPY;  Service: Cardiovascular;  Laterality: N/A;   CARDIOVERSION N/A 01/27/2021   Procedure: CARDIOVERSION;  Surgeon: Jolaine Artist, MD;  Location: [facility] ENDOSCOPY;  Service: Cardiovascular;  Laterality: N/A;   COLONOSCOPY WITH PROPOFOL N/A 02/03/2015   Procedure: COLONOSCOPY WITH PROPOFOL;  Surgeon: Arta Silence, MD;  Location: [facility] ENDOSCOPY;  Service: Endoscopy;  Laterality: N/A;   KNEE ARTHROSCOPY Left    ROTATOR CUFF REPAIR Right    SHOULDER ARTHROSCOPY Left     Current Medications: Current Meds  Medication Sig   acetaminophen (TYLENOL) 500 MG tablet Take 500-1,000 mg by mouth every 8 (eight) hours as needed for mild pain or headache.   albuterol (PROVENTIL HFA;VENTOLIN HFA) 108 (90 Base) MCG/ACT inhaler Inhale 2 puffs into the lungs every 4 (four) hours as needed for wheezing or shortness of breath.   albuterol (PROVENTIL) (2.5 MG/3ML) 0.083% nebulizer solution Take 2.5 mg by nebulization every 4 (four) hours as needed for wheezing or shortness of breath.   allopurinol (ZYLOPRIM) 100 MG tablet Take 100 mg by mouth daily.   amiodarone (PACERONE) 200 MG tablet Take 1 tablet (200 mg total) by mouth 2 (two) times daily.   apixaban (ELIQUIS) 2.5 MG TABS tablet Take 1 tablet (2.5 mg total) by mouth 2 (two) times daily.   Calcium Carb-Cholecalciferol (CALCIUM 600+D3 PO) Take 2 tablets by mouth daily.   Ensure (ENSURE) Take 237 mLs by mouth daily.   Fluticasone-Salmeterol (ADVAIR) 250-50 MCG/DOSE AEPB Inhale 1 puff into the lungs 2 (two) times daily.   furosemide (LASIX) 40 MG tablet Take 1 tablet (40 mg total) by mouth every Monday, Wednesday, and Friday.   latanoprost (XALATAN)  0.005 % ophthalmic solution Place 1 drop into both eyes at bedtime.   Multiple Vitamin (MULTIVITAMIN WITH MINERALS) TABS tablet Take 1 tablet by mouth every morning.   Omega-3 Fatty Acids (FISH OIL) 1000 MG CAPS Take 1,000 mg by mouth daily.   OXYGEN Inhale 2 L into the lungs continuous.   pantoprazole (PROTONIX) 20 MG tablet Take 1 tablet (20 mg total) by mouth daily.   polyethylene glycol (MIRALAX / GLYCOLAX) 17 g packet Take 17 g by mouth daily.   promethazine-dextromethorphan (PROMETHAZINE-DM) 6.25-15 MG/5ML syrup Take 5 mLs by mouth See admin instructions. Every 4 to 6 hours as needed for cough     Allergies:   Penicillins   Social History   Socioeconomic History   Marital status: Widowed    Spouse name: Not on file   Number of children: 5   Years of education: 8   Highest education level: Not on file  Occupational History   Occupation: Retired   Tobacco Use   Smoking  status: Former    Packs/day: 1.00    Years: 30.00    Pack years: 30.00    Types: Cigarettes   Smokeless tobacco: Never   Tobacco comments:    quit 35 yrs ago  Substance and Sexual Activity   Alcohol use: No   Drug use: No   Sexual activity: Not on file  Other Topics Concern   Not on file  Social History Narrative   11/28/18 Lives with grandson, [name].    Caffeine use: daily   Right handed    Social Determinants of Health   Financial Resource Strain: Low Risk    Difficulty of Paying Living Expenses: Not very hard  Food Insecurity: No Food Insecurity   Worried About Charity fundraiser in the Last Year: Never true   Ran Out of Food in the Last Year: Never true  Transportation Needs: No Transportation Needs   Lack of Transportation (Medical): No   Lack of Transportation (Non-Medical): No  Physical Activity: Not on file  Stress: Not on file  Social Connections: Not on file     Family History: The patient's family history includes Hypertension in his father and mother. ROS:   Please see the  history of present illness.    All other systems reviewed and are negative.  EKGs/Labs/Other Studies Reviewed:    The following studies were reviewed today:  EKG:  EKG ordered today and personally reviewed.  The ekg ordered today demonstrates atrial tachycardia 2-1 conduction ventricular rate 106 bpm right bundle branch block  Recent Labs: 10/27/2020: TSH 2.820 12/15/2020: NT-Pro BNP 18,617 01/18/2021: ALT 15; B Natriuretic Peptide 2,417.5 01/25/2021: Magnesium 2.5 01/28/2021: BUN 86; Creatinine, Ser 2.18; Hemoglobin 11.8; Platelets 343; Potassium 5.0; Sodium 133  Recent Lipid Panel    Component Value Date/Time   CHOL 111 07/20/2018 0623   CHOL 149 12/10/2017 1157   TRIG 61 07/20/2018 0623   HDL 37 (L) 07/20/2018 0623   HDL 40 12/10/2017 1157   CHOLHDL 3.0 07/20/2018 0623   VLDL 12 07/20/2018 0623   LDLCALC 62 07/20/2018 0623   LDLCALC 73 12/10/2017 1157    Physical Exam:    VS:  BP (!) 82/54 (BP Location: Right Arm)    Pulse (!) 106    Ht 5\' 9"  (1.753 m)    Wt 176 lb 9.6 oz (80.1 kg)    SpO2 99% Comment: 2 liters [place]   BMI 26.08 kg/m     Wt Readings from Last 3 Encounters:  01/31/21 176 lb 9.6 oz (80.1 kg)  01/28/21 173 lb 15.1 oz (78.9 kg)  01/14/21 197 lb 8.5 oz (89.6 kg)     GEN: He looks very debilitated and weak well nourished, well developed in no acute distress HEENT: Normal NECK: No JVD; No carotid bruits LYMPHATICS: No lymphadenopathy CARDIAC: Distant heart sounds RRR, no murmurs, rubs, gallops RESPIRATORY:  Clear to auscultation without rales, wheezing or rhonchi  ABDOMEN: Soft, non-tender, non-distended MUSCULOSKELETAL:  No edema; No deformity  SKIN: Warm and dry NEUROLOGIC:  Alert and oriented x 3 PSYCHIATRIC:  Normal affect    Signed, Shirlee More, MD  01/31/2021 3:24 PM    [facility]

## 2021-01-31 ENCOUNTER — Ambulatory Visit: Payer: PPO | Admitting: Cardiology

## 2021-01-31 ENCOUNTER — Encounter: Payer: Self-pay | Admitting: Cardiology

## 2021-01-31 ENCOUNTER — Other Ambulatory Visit: Payer: Self-pay

## 2021-01-31 VITALS — BP 82/54 | HR 106 | Ht 69.0 in | Wt 176.6 lb

## 2021-01-31 DIAGNOSIS — I110 Hypertensive heart disease with heart failure: Secondary | ICD-10-CM

## 2021-01-31 DIAGNOSIS — D72829 Elevated white blood cell count, unspecified: Secondary | ICD-10-CM | POA: Diagnosis not present

## 2021-01-31 DIAGNOSIS — Z9981 Dependence on supplemental oxygen: Secondary | ICD-10-CM | POA: Diagnosis not present

## 2021-01-31 DIAGNOSIS — I130 Hypertensive heart and chronic kidney disease with heart failure and stage 1 through stage 4 chronic kidney disease, or unspecified chronic kidney disease: Secondary | ICD-10-CM | POA: Diagnosis not present

## 2021-01-31 DIAGNOSIS — I5042 Chronic combined systolic (congestive) and diastolic (congestive) heart failure: Secondary | ICD-10-CM | POA: Diagnosis not present

## 2021-01-31 DIAGNOSIS — J45909 Unspecified asthma, uncomplicated: Secondary | ICD-10-CM | POA: Diagnosis not present

## 2021-01-31 DIAGNOSIS — I484 Atypical atrial flutter: Secondary | ICD-10-CM

## 2021-01-31 DIAGNOSIS — I480 Paroxysmal atrial fibrillation: Secondary | ICD-10-CM | POA: Diagnosis not present

## 2021-01-31 DIAGNOSIS — Z9181 History of falling: Secondary | ICD-10-CM | POA: Diagnosis not present

## 2021-01-31 DIAGNOSIS — D508 Other iron deficiency anemias: Secondary | ICD-10-CM

## 2021-01-31 DIAGNOSIS — R531 Weakness: Secondary | ICD-10-CM | POA: Diagnosis not present

## 2021-01-31 DIAGNOSIS — I5043 Acute on chronic combined systolic (congestive) and diastolic (congestive) heart failure: Secondary | ICD-10-CM | POA: Diagnosis not present

## 2021-01-31 DIAGNOSIS — Z7901 Long term (current) use of anticoagulants: Secondary | ICD-10-CM | POA: Diagnosis not present

## 2021-01-31 DIAGNOSIS — N184 Chronic kidney disease, stage 4 (severe): Secondary | ICD-10-CM

## 2021-01-31 DIAGNOSIS — I5041 Acute combined systolic (congestive) and diastolic (congestive) heart failure: Secondary | ICD-10-CM | POA: Diagnosis not present

## 2021-01-31 DIAGNOSIS — J441 Chronic obstructive pulmonary disease with (acute) exacerbation: Secondary | ICD-10-CM | POA: Diagnosis not present

## 2021-01-31 DIAGNOSIS — Z7951 Long term (current) use of inhaled steroids: Secondary | ICD-10-CM | POA: Diagnosis not present

## 2021-01-31 DIAGNOSIS — H9190 Unspecified hearing loss, unspecified ear: Secondary | ICD-10-CM | POA: Diagnosis not present

## 2021-01-31 DIAGNOSIS — J9601 Acute respiratory failure with hypoxia: Secondary | ICD-10-CM | POA: Diagnosis not present

## 2021-01-31 DIAGNOSIS — J9811 Atelectasis: Secondary | ICD-10-CM | POA: Diagnosis not present

## 2021-01-31 DIAGNOSIS — J101 Influenza due to other identified influenza virus with other respiratory manifestations: Secondary | ICD-10-CM | POA: Diagnosis not present

## 2021-01-31 NOTE — Patient Instructions (Signed)
Medication Instructions:  Your physician has recommended you make the following change in your medication:  Only take your furosemide on Monday, Wednesday, and Friday if you weigh 177 lbs or greater.  *If you need a refill on your cardiac medications before your next appointment, please call your pharmacy*   Lab Work: None If you have labs (blood work) drawn today and your tests are completely normal, you will receive your results only by: [place] (if you have MyChart) OR A paper copy in the mail If you have any lab test that is abnormal or we need to change your treatment, we will call you to review the results.   Testing/Procedures: None   Follow-Up: At [facility], you and your health needs are our priority.  As part of our continuing mission to provide you with exceptional heart care, we have created designated Provider Care Teams.  These Care Teams include your primary Cardiologist (physician) and Advanced Practice Providers (APPs -  Physician Assistants and Nurse Practitioners) who all work together to provide you with the care you need, when you need it.  We recommend signing up for the patient portal called "MyChart".  Sign up information is provided on this After Visit Summary.  MyChart is used to connect with patients for Virtual Visits (Telemedicine).  Patients are able to view lab/test results, encounter notes, upcoming appointments, etc.  Non-urgent messages can be sent to your provider as well.   To learn more about what you can do with MyChart, go to [email or web address].    Your next appointment:   6 week(s)  The format for your next appointment:   In Person  Provider:   Shirlee More, MD    Other Instructions

## 2021-01-31 NOTE — Progress Notes (Incomplete)
[facility]  [address] [place],  [place]  [address] [phone]  Clinic Day:  01/31/2021  Referring physician: [name], [name], *  This document serves as a record of services personally performed by Nicholas [name], [name] It was created on their behalf by Curry,Lauren E, a trained medical scribe. The creation of this record is based on the scribe's personal observations and the provider's statements to them.  HISTORY OF PRESENT ILLNESS:  The patient is a 88 y.o. male who I recently began seeing for anemia.  Labs showed a low hemoglobin of 9.2, with a low MCV of 69.  In the past, his MCV levels were in the mid 80's.  The patient denies having any overt forms of blood loss to explain his anemia.  He claims to have had a colonoscopy 3-4 years ago, which apparently came back normal.  The patient claims he has been taking a fourth of an iron tablet every other day for the past few months.  He denies there being a family history of anemia or other hematologic disorders.    PHYSICAL EXAM:  There were no vitals taken for this visit. Wt Readings from Last 3 Encounters:  01/28/21 173 lb 15.1 oz (78.9 kg)  01/14/21 197 lb 8.5 oz (89.6 kg)  01/11/21 197 lb 9.6 oz (89.6 kg)   There is no height or weight on file to calculate BMI. Performance status (ECOG): 1 - Symptomatic but completely ambulatory Physical Exam Constitutional:      Appearance: Normal appearance. He is not ill-appearing.  HENT:     Mouth/Throat:     Mouth: Mucous membranes are moist.     Pharynx: Oropharynx is clear. No oropharyngeal exudate or posterior oropharyngeal erythema.  Cardiovascular:     Rate and Rhythm: Normal rate and regular rhythm.     Heart sounds: No murmur heard.   No friction rub. No gallop.  Pulmonary:     Effort: Pulmonary effort is normal. No respiratory distress.     Breath sounds: Normal breath sounds. No wheezing, rhonchi or rales.  Abdominal:     General:  Bowel sounds are normal. There is no distension.     Palpations: Abdomen is soft. There is no mass.     Tenderness: There is no abdominal tenderness.  Musculoskeletal:        General: No swelling.     Right lower leg: No edema.     Left lower leg: No edema.  Lymphadenopathy:     Cervical: No cervical adenopathy.     Upper Body:     Right upper body: No supraclavicular or axillary adenopathy.     Left upper body: No supraclavicular or axillary adenopathy.     Lower Body: No right inguinal adenopathy. No left inguinal adenopathy.  Skin:    General: Skin is warm.     Coloration: Skin is not jaundiced.     Findings: No lesion or rash.  Neurological:     General: No focal deficit present.     Mental Status: He is alert and oriented to person, place, and time. Mental status is at baseline.  Psychiatric:        Mood and Affect: Mood normal.        Behavior: Behavior normal.        Thought Content: Thought content normal.   LABS:    Ref. Range 11/04/2020 10:16  Iron Latest Ref Range: 45 - 182 ug/dL 16 (L)  UIBC Latest  Units: ug/dL 406  TIBC Latest Ref Range: 250 - 450 ug/dL 422  Saturation Ratios Latest Ref Range: 17.9 - 39.5 % 4 (L)  Ferritin Latest Ref Range: 24 - 336 ng/mL 5 (L)  Folate Latest Ref Range: >5.9 ng/mL 44.1  Vitamin B12 Latest Ref Range: 180 - 914 pg/mL 307   ASSESSMENT & PLAN:  An 88 y.o. male whose labs clearly reflect iron deficiency anemia.  I will arrange for him to receive IV iron over these next few weeks to rapidly bolster his iron stores and improve his hemoglobin. As his labs are in stark contrast to what they were 3 years ago, I do feel this gentleman needs to be re-evaluated by GI to ensure his iron deficiency anemia is not due to any ominous, occult GI tract disease.  The patient and his daughter voiced understanding of this, but want to think more about it before officially seeking a GI referral.  I will see him back in 3 months to see how well he responded  to his upcoming IV iron.  The patient understands all the plans discussed today and is in agreement with them.   I, Rita Ohara, am acting as scribe for Nicholas [name], Nicholas [name]    I have reviewed this report as typed by the medical scribe, and it is complete and accurate.  Nicholas Macarthur Critchley, Nicholas [name]

## 2021-02-04 ENCOUNTER — Ambulatory Visit: Payer: PPO | Admitting: Oncology

## 2021-02-04 ENCOUNTER — Other Ambulatory Visit: Payer: Self-pay

## 2021-02-04 ENCOUNTER — Ambulatory Visit (HOSPITAL_COMMUNITY)
Admit: 2021-02-04 | Discharge: 2021-02-04 | Disposition: A | Discharge status: Home / Self Care | Payer: PPO | Attending: Family Medicine | Admitting: Family Medicine

## 2021-02-04 ENCOUNTER — Encounter (HOSPITAL_COMMUNITY): Payer: Self-pay

## 2021-02-04 ENCOUNTER — Other Ambulatory Visit: Payer: PPO

## 2021-02-04 VITALS — BP 125/72 | HR 109 | Wt 182.2 lb

## 2021-02-04 DIAGNOSIS — Z8249 Family history of ischemic heart disease and other diseases of the circulatory system: Secondary | ICD-10-CM | POA: Insufficient documentation

## 2021-02-04 DIAGNOSIS — Z7901 Long term (current) use of anticoagulants: Secondary | ICD-10-CM | POA: Diagnosis not present

## 2021-02-04 DIAGNOSIS — J9611 Chronic respiratory failure with hypoxia: Secondary | ICD-10-CM | POA: Diagnosis not present

## 2021-02-04 DIAGNOSIS — I10 Essential (primary) hypertension: Secondary | ICD-10-CM | POA: Diagnosis not present

## 2021-02-04 DIAGNOSIS — D509 Iron deficiency anemia, unspecified: Secondary | ICD-10-CM | POA: Insufficient documentation

## 2021-02-04 DIAGNOSIS — I130 Hypertensive heart and chronic kidney disease with heart failure and stage 1 through stage 4 chronic kidney disease, or unspecified chronic kidney disease: Secondary | ICD-10-CM | POA: Insufficient documentation

## 2021-02-04 DIAGNOSIS — D508 Other iron deficiency anemias: Secondary | ICD-10-CM

## 2021-02-04 DIAGNOSIS — J441 Chronic obstructive pulmonary disease with (acute) exacerbation: Secondary | ICD-10-CM | POA: Insufficient documentation

## 2021-02-04 DIAGNOSIS — Z79899 Other long term (current) drug therapy: Secondary | ICD-10-CM | POA: Insufficient documentation

## 2021-02-04 DIAGNOSIS — I3139 Other pericardial effusion (noninflammatory): Secondary | ICD-10-CM | POA: Insufficient documentation

## 2021-02-04 DIAGNOSIS — I484 Atypical atrial flutter: Secondary | ICD-10-CM | POA: Diagnosis not present

## 2021-02-04 DIAGNOSIS — E861 Hypovolemia: Secondary | ICD-10-CM | POA: Diagnosis not present

## 2021-02-04 DIAGNOSIS — I428 Other cardiomyopathies: Secondary | ICD-10-CM | POA: Insufficient documentation

## 2021-02-04 DIAGNOSIS — Z8673 Personal history of transient ischemic attack (TIA), and cerebral infarction without residual deficits: Secondary | ICD-10-CM | POA: Insufficient documentation

## 2021-02-04 DIAGNOSIS — M109 Gout, unspecified: Secondary | ICD-10-CM | POA: Insufficient documentation

## 2021-02-04 DIAGNOSIS — I471 Supraventricular tachycardia: Secondary | ICD-10-CM | POA: Diagnosis not present

## 2021-02-04 DIAGNOSIS — Z9981 Dependence on supplemental oxygen: Secondary | ICD-10-CM | POA: Insufficient documentation

## 2021-02-04 DIAGNOSIS — I4510 Unspecified right bundle-branch block: Secondary | ICD-10-CM | POA: Diagnosis not present

## 2021-02-04 DIAGNOSIS — I480 Paroxysmal atrial fibrillation: Secondary | ICD-10-CM | POA: Diagnosis not present

## 2021-02-04 DIAGNOSIS — N184 Chronic kidney disease, stage 4 (severe): Secondary | ICD-10-CM | POA: Insufficient documentation

## 2021-02-04 DIAGNOSIS — I5042 Chronic combined systolic (congestive) and diastolic (congestive) heart failure: Secondary | ICD-10-CM | POA: Diagnosis not present

## 2021-02-04 DIAGNOSIS — Z66 Do not resuscitate: Secondary | ICD-10-CM | POA: Diagnosis not present

## 2021-02-04 DIAGNOSIS — I5022 Chronic systolic (congestive) heart failure: Secondary | ICD-10-CM | POA: Insufficient documentation

## 2021-02-04 NOTE — Patient Instructions (Signed)
Congratulations you have graduated from the Heart Failure clinic !!!  Please keep all follow ups with your General Cardiologist.   If you need [place] is the future please send [place] a message through [place] or call our office at [phone].    TO LEAVE A MESSAGE FOR THE NURSE SELECT OPTION 2, PLEASE LEAVE A MESSAGE INCLUDING: YOUR NAME DATE OF BIRTH CALL BACK NUMBER REASON FOR CALL**this is important as we prioritize the call backs  YOU WILL RECEIVE A CALL BACK THE SAME DAY AS LONG AS YOU CALL BEFORE 4:00 PM

## 2021-02-04 NOTE — Progress Notes (Signed)
Advanced Heart Failure Clinic Consult Note  Primary Care: Street, [name], MD Primary Cardiologist: Dr Bettina Gavia  Nephrology: Dr Justin Mend GI : Dr Paulita Fujita Hematology: Dr Bobby Rumpf HF Cardiologist: Dr. Haroldine Laws  HPI: Nicholas [name] is a 88 y.o. with history of CKD Stage IIIB, A flutter, HFmEF, HTN, TIA, IDA, and gout. He has not had an ischemic work up.   He was first noted to be in A fib on 09/22/20. Referred to Dr Bettina Gavia for A fib. Cardioversion was delayed due to lanemia.   Saw Hematology on 11/04/20. Iron sats 4%. Had 2 iron transfusions. GI follow up was recommended.   Presented for scheduled cardioversion on 12/24/20 but procedure cancelled due to volume overload. Diuresed with IV lasix and loaded on amio . Had successful DC-CV on 12/30/20. Echo completed--->EF down from 55-60% to 40-45%. Suspect EF lower given A flutter RVR. Amio taper was in place. Discharged on 12/22. Discharge weight 191 pounds.   He was seen in the HF [facility] clinic 01/12/20. EKG  A Fib with rate 98. Had volume overload so lasix was increased. He was set up for DC-CV.    On 01/14/21 he presented for cardioversion. On exam he was wheezing. Prior to procedure he was given IV lasix and nebulizer.  Later had successful cardioversion on 01/15/20.    He was seen in Urgent Care 01/17/21 for cough and wheezing. Test  + infuenza B and started on Tamiflu.   Admitted 01/18/21 for a/c CHF, COPD exacerbation, and AF. AHF consulted, EF 25-30%, grade I DD, moderate pericardial effusion. He was diuresed (down 13 lbs) and underwent successful DCCV. Palliative consults, he remained DNR. He was discharged on oxygen and prednisone. No MRA/ACE with CKD, lasix continued MWF. Discharge weight 173 lbs.  Follow up with Dr. Bettina Gavia 1/23 and was hypovolemic, Lasix changes to MWF PRN for weight >177 lbs.  Today he presents for post hospital HF follow up with his son. Most of history from son as patient is exceptional HOH. Nicholas [name] is not SOB walking short  distances with his walker on oxygen. He is working with PT and son says he notices an improvement in his strength. Denies palpitations, CP, dizziness, edema, or abnormal bleeding, PND/Orthopnea. Appetite ok. No fever or chills. Weight at home 176 pounds. Taking all medications. Working with [facility] PT Alvis Lemmings) and palliative.  Cardiac Testing  Echo 1/23 EF 25-30% Echo 12/2020 EF 40-45%  Echo 2020 EF 55-60%  Echo 2019 Ef 50-55%   Review of Systems: [y] = yes, [ ]  = no   General: Weight gain [ ] ; Weight loss Blue.Reese ]; Anorexia [ ] ; Fatigue [ ] ; Fever [ ] ; Chills [ ] ; Weakness Blue.Reese ]  Cardiac: Chest pain/pressure [ ] ; Resting SOB [ ] ; Exertional SOB [ y]; Orthopnea [ ] ; Pedal Edema [ ] ; Palpitations [ ] ; Syncope [ ] ; Presyncope [ ] ; Paroxysmal nocturnal dyspnea[ ]   Pulmonary: Cough [ ] ; Wheezing[ ] ; Hemoptysis[ ] ; Sputum [ ] ; Snoring [ ]   GI: Vomiting[ ] ; Dysphagia [ ] ; Melena[ ] ; Hematochezia [ ] ; Heartburn[ ] ; Abdominal pain [ ] ; Constipation [ ] ; Diarrhea [ ] ; BRBPR [ ]   GU: Hematuria[ ] ; Dysuria [ ] ; Nocturia[ ]  Vascular: Pain in legs with walking [ ] ; Pain in feet with lying flat [ ] ; Non-healing sores [ ] ; Stroke [ ] ; TIA Blue.Reese ]; Slurred speech [ ] ;  Neuro: Headaches[ ] ; Vertigo[ ] ; Seizures[ ] ; Paresthesias[ ] ;Blurred vision [ ] ; Diplopia [ ] ; Vision changes [ ]   Ortho/Skin: Arthritis [ ] ;  Joint pain [ ] ; Muscle pain [ ] ; Joint swelling [ ] ; Back Pain [ ] ; Rash [ ]   Psych: Depression[ ] ; Anxiety[ ]   Heme: Bleeding problems [ ] ; Clotting disorders [ ] ; Anemia Blue.Reese ]  Endocrine: Diabetes [ ] ; Thyroid dysfunction[ ]    Past Medical History:  Diagnosis Date   Anemia    none recent   Arthritis    Asthma    Chronic kidney disease    ckd stage 3   Family history of adverse reaction to anesthesia    daughter has ponv    Glaucoma    Hypertension     Current Outpatient Medications  Medication Sig Dispense Refill   acetaminophen (TYLENOL) 500 MG tablet Take 500-1,000 mg by mouth every 8 (eight) hours  as needed for mild pain or headache.     albuterol (PROVENTIL HFA;VENTOLIN HFA) 108 (90 Base) MCG/ACT inhaler Inhale 2 puffs into the lungs every 4 (four) hours as needed for wheezing or shortness of breath.     albuterol (PROVENTIL) (2.5 MG/3ML) 0.083% nebulizer solution Take 2.5 mg by nebulization every 4 (four) hours as needed for wheezing or shortness of breath.     allopurinol (ZYLOPRIM) 100 MG tablet Take 100 mg by mouth daily.     amiodarone (PACERONE) 200 MG tablet Take 1 tablet (200 mg total) by mouth 2 (two) times daily. 120 tablet 1   apixaban (ELIQUIS) 2.5 MG TABS tablet Take 1 tablet (2.5 mg total) by mouth 2 (two) times daily. 60 tablet 0   Calcium Carb-Cholecalciferol (CALCIUM 600+D3 PO) Take 2 tablets by mouth daily.     Ensure (ENSURE) Take 237 mLs by mouth daily.     Fluticasone-Salmeterol (ADVAIR) 250-50 MCG/DOSE AEPB Inhale 1 puff into the lungs 2 (two) times daily.     furosemide (LASIX) 40 MG tablet Take 1 tablet (40 mg total) by mouth every Monday, Wednesday, and Friday. 12 tablet 0   latanoprost (XALATAN) 0.005 % ophthalmic solution Place 1 drop into both eyes at bedtime.     Multiple Vitamin (MULTIVITAMIN WITH MINERALS) TABS tablet Take 1 tablet by mouth every morning.     Omega-3 Fatty Acids (FISH OIL) 1000 MG CAPS Take 1,000 mg by mouth daily.     pantoprazole (PROTONIX) 20 MG tablet Take 1 tablet (20 mg total) by mouth daily. 30 tablet 3   polyethylene glycol (MIRALAX / GLYCOLAX) 17 g packet Take 17 g by mouth daily. 14 each 0   OXYGEN Inhale 2 L into the lungs continuous.     No current facility-administered medications for this encounter.    Allergies  Allergen Reactions   Penicillins Hives    Childhood allergy Has patient had a PCN reaction causing immediate rash, facial/tongue/throat swelling, SOB or lightheadedness with hypotension: Yes Has patient had a PCN reaction causing severe rash involving mucus membranes or skin necrosis: Yes Has patient had a PCN  reaction that required hospitalization: Yes Has patient had a PCN reaction occurring within the last 10 years: No If all of the above answers are "NO", then may proceed with Cephalosporin use.     Social History   Socioeconomic History   Marital status: Widowed    Spouse name: Not on file   Number of children: 5   Years of education: 8   Highest education level: Not on file  Occupational History   Occupation: Retired   Tobacco Use   Smoking status: Former    Packs/day: 1.00    Years: 30.00  Pack years: 30.00    Types: Cigarettes   Smokeless tobacco: Never   Tobacco comments:    quit 35 yrs ago  Substance and Sexual Activity   Alcohol use: No   Drug use: No   Sexual activity: Not on file  Other Topics Concern   Not on file  Social History Narrative   11/28/18 Lives with grandson, [name]    Caffeine use: daily   Right handed    Social Determinants of Health   Financial Resource Strain: Low Risk    Difficulty of Paying Living Expenses: Not very hard  Food Insecurity: No Food Insecurity   Worried About Charity fundraiser in the Last Year: Never true   Ran Out of Food in the Last Year: Never true  Transportation Needs: No Transportation Needs   Lack of Transportation (Medical): No   Lack of Transportation (Non-Medical): No  Physical Activity: Not on file  Stress: Not on file  Social Connections: Not on file  Intimate Partner Violence: Not on file   Family History  Problem Relation Age of Onset   Hypertension Mother    Hypertension Father    BP 125/72    Pulse (!) 109    Wt 82.6 kg    SpO2 96%    BMI 26.91 kg/m   Wt Readings from Last 3 Encounters:  02/04/21 82.6 kg  01/31/21 80.1 kg  01/28/21 78.9 kg   PHYSICAL EXAM: General:  NAD. No resp difficulty, walked into clinic with cane, elderly HEENT: + HOH Neck: Supple. NJVP 7-8. Carotids 2+ bilat; no bruits. No lymphadenopathy or thryomegaly appreciated. Cor: PMI nondisplaced. Distant heart tones,  irregular tachy No rubs, gallops or murmurs. Lungs: Clear, diminished in bases Abdomen: Soft, nontender, nondistended. No hepatosplenomegaly. No bruits or masses. Good bowel sounds. Extremities: No cyanosis, clubbing, rash, edema Neuro: Alert & oriented x 3, cranial nerves grossly intact. Moves all 4 extremities w/o difficulty. Affect pleasant.  ECG: AFL 109 bpm  ASSESSMENT & PLAN: Chronic HFmEF - NICM suspect tachy-mediated but he has not had an ischemic work up. Could consider myoview.  - Echo 01/20/21 25-30%---> 12/2020 EF 40-45%  - Stable NYHA III-early IIIb, functional class difficult due to deconditioning. Volume status mildly up, ReDs 37% - Would go back to Lasix 40 mg MWF  - No ACE/ARB/ARNI- with elevated creatinine/hyperkalemia - No MRA with elevated creatinine/hyperkalemia - SGLT2i- Could consider Jardiance but with CKD Stage IV hold off.  - Labs reviewed from 01/28/21 and look OK.   2. PAFL/atrial tach - S/P DC-CV 12/2020 --> NSR - Went back in A fib and had another DC_CV 01/15/20--> SR - Went back in A fib 1/13.  Atrial Tach. - Successful DC-CV 1/19. Unfortunately back in AF today, HR 109. - Consider restarting bisoprolol 2.5 mg daily. Defer for now, see #7 - Continue amiodarone 200 mg bid. - Continue Eliquis 2.5 mg bid. - Discussed referral to AF clinic (ablation candidate?) however patient and son do not want to pursue further work up.   3. Chronic  Hypoxic Respiratory Failure - Continue home 02. Sats stable today.   4. HTN  - BP Stable.    5. CKD Stage IV - Creatinine baseline 2.4-2.6 - Recent SCr 2.18 (01/28/21)   6. IDA - He was seen by Hematology 11/04/20 for anemia.  - He has received IV iron in the past. - Hgb stable 11.8 on 01/28/21.   - He has been referred to GI.  7. DNR  - Palliative  Care following.  - Patient and son would like to consolidate visits and keep Nicholas [name] comfortable. They do not want to pursue further work up of his AF and would like to  follow with Dr. Bettina Gavia for further cardiac issues. Son aware that Nicholas [name] may decompensate and may consider transition to hospice.    Follow up with AHF as needed.  Allena Katz, FNP-BC 02/04/21

## 2021-02-04 NOTE — Progress Notes (Signed)
ReDS Vest / Clip - 02/04/21 1200       ReDS Vest / Clip   Station Marker C    Ruler Value 31    ReDS Value Range Moderate volume overload    ReDS Actual Value 39

## 2021-02-07 DIAGNOSIS — E785 Hyperlipidemia, unspecified: Secondary | ICD-10-CM | POA: Diagnosis not present

## 2021-02-07 DIAGNOSIS — I480 Paroxysmal atrial fibrillation: Secondary | ICD-10-CM | POA: Diagnosis not present

## 2021-02-07 DIAGNOSIS — I10 Essential (primary) hypertension: Secondary | ICD-10-CM | POA: Diagnosis not present

## 2021-02-07 DIAGNOSIS — I484 Atypical atrial flutter: Secondary | ICD-10-CM | POA: Diagnosis not present

## 2021-02-07 DIAGNOSIS — N1832 Chronic kidney disease, stage 3b: Secondary | ICD-10-CM | POA: Diagnosis not present

## 2021-02-07 DIAGNOSIS — Q049 Congenital malformation of brain, unspecified: Secondary | ICD-10-CM | POA: Diagnosis not present

## 2021-02-07 DIAGNOSIS — I5022 Chronic systolic (congestive) heart failure: Secondary | ICD-10-CM | POA: Diagnosis not present

## 2021-02-07 DIAGNOSIS — M1991 Primary osteoarthritis, unspecified site: Secondary | ICD-10-CM | POA: Diagnosis not present

## 2021-02-07 DIAGNOSIS — M7502 Adhesive capsulitis of left shoulder: Secondary | ICD-10-CM | POA: Diagnosis not present

## 2021-02-07 DIAGNOSIS — D6869 Other thrombophilia: Secondary | ICD-10-CM | POA: Diagnosis not present

## 2021-02-07 DIAGNOSIS — J418 Mixed simple and mucopurulent chronic bronchitis: Secondary | ICD-10-CM | POA: Diagnosis not present

## 2021-02-07 DIAGNOSIS — I25119 Atherosclerotic heart disease of native coronary artery with unspecified angina pectoris: Secondary | ICD-10-CM | POA: Diagnosis not present

## 2021-02-08 ENCOUNTER — Telehealth: Payer: Self-pay | Admitting: Oncology

## 2021-02-08 NOTE — Telephone Encounter (Signed)
Patients daughter called requesting appt be cancelled. She has tested positive for COVID today and is his caregiver / driver for appts. Appt has been R/S'd.

## 2021-02-08 NOTE — Progress Notes (Incomplete)
[facility]  [address] [place],  [place]  [address] [phone]  Clinic Day:  02/08/2021  Referring physician: [name], [name], *  This document serves as a record of services personally performed by Marice Potter, MD. It was created on their behalf by Curry,Lauren E, a trained medical scribe. The creation of this record is based on the scribe's personal observations and the provider's statements to them.  HISTORY OF PRESENT ILLNESS:  The patient is a 88 y.o. male who I recently began seeing for anemia.  Labs 1 week ago showed a low hemoglobin of 9.2, with a low MCV of 69.  In the past, his MCV levels were in the mid 80's.  The patient denies having any overt forms of blood loss to explain his anemia.  He claims to have had a colonoscopy 3-4 years ago, which apparently came back normal.  The patient claims he has been taking a fourth of an iron tablet every other day for the past few months.  He denies there being a family history of anemia or other hematologic disorders.    PHYSICAL EXAM:  There were no vitals taken for this visit. Wt Readings from Last 3 Encounters:  02/04/21 182 lb 3.2 oz (82.6 kg)  01/31/21 176 lb 9.6 oz (80.1 kg)  01/28/21 173 lb 15.1 oz (78.9 kg)   There is no height or weight on file to calculate BMI. Performance status (ECOG): 1 - Symptomatic but completely ambulatory Physical Exam Constitutional:      Appearance: Normal appearance. He is not ill-appearing.  HENT:     Mouth/Throat:     Mouth: Mucous membranes are moist.     Pharynx: Oropharynx is clear. No oropharyngeal exudate or posterior oropharyngeal erythema.  Cardiovascular:     Rate and Rhythm: Normal rate and regular rhythm.     Heart sounds: No murmur heard.   No friction rub. No gallop.  Pulmonary:     Effort: Pulmonary effort is normal. No respiratory distress.     Breath sounds: Normal breath sounds. No wheezing, rhonchi or rales.  Abdominal:      General: Bowel sounds are normal. There is no distension.     Palpations: Abdomen is soft. There is no mass.     Tenderness: There is no abdominal tenderness.  Musculoskeletal:        General: No swelling.     Right lower leg: No edema.     Left lower leg: No edema.  Lymphadenopathy:     Cervical: No cervical adenopathy.     Upper Body:     Right upper body: No supraclavicular or axillary adenopathy.     Left upper body: No supraclavicular or axillary adenopathy.     Lower Body: No right inguinal adenopathy. No left inguinal adenopathy.  Skin:    General: Skin is warm.     Coloration: Skin is not jaundiced.     Findings: No lesion or rash.  Neurological:     General: No focal deficit present.     Mental Status: He is alert and oriented to person, place, and time. Mental status is at baseline.  Psychiatric:        Mood and Affect: Mood normal.        Behavior: Behavior normal.        Thought Content: Thought content normal.   LABS:    Ref. Range 11/04/2020 10:16  Iron Latest Ref Range: 45 - 182 ug/dL 16 (L)  UIBC Latest Units: ug/dL 406  TIBC Latest Ref Range: 250 - 450 ug/dL 422  Saturation Ratios Latest Ref Range: 17.9 - 39.5 % 4 (L)  Ferritin Latest Ref Range: 24 - 336 ng/mL 5 (L)  Folate Latest Ref Range: >5.9 ng/mL 44.1  Vitamin B12 Latest Ref Range: 180 - 914 pg/mL 307   ASSESSMENT & PLAN:  An 88 y.o. male whose labs clearly reflect iron deficiency anemia.  I will arrange for him to receive IV iron over these next few weeks to rapidly bolster his iron stores and improve his hemoglobin. As his labs are in stark contrast to what they were 3 years ago, I do feel this gentleman needs to be re-evaluated by GI to ensure his iron deficiency anemia is not due to any ominous, occult GI tract disease.  The patient and his daughter voiced understanding of this, but want to think more about it before officially seeking a GI referral.  I will see him back in 3 months to see how well he  responded to his upcoming IV iron.  The patient understands all the plans discussed today and is in agreement with them.  I, Rita Ohara, am acting as scribe for Marice Potter, MD    I have reviewed this report as typed by the medical scribe, and it is complete and accurate.  Dequincy Macarthur Critchley, MD

## 2021-02-09 DIAGNOSIS — J441 Chronic obstructive pulmonary disease with (acute) exacerbation: Secondary | ICD-10-CM | POA: Diagnosis not present

## 2021-02-09 DIAGNOSIS — R531 Weakness: Secondary | ICD-10-CM | POA: Diagnosis not present

## 2021-02-09 DIAGNOSIS — J45909 Unspecified asthma, uncomplicated: Secondary | ICD-10-CM | POA: Diagnosis not present

## 2021-02-09 DIAGNOSIS — I5041 Acute combined systolic (congestive) and diastolic (congestive) heart failure: Secondary | ICD-10-CM | POA: Diagnosis not present

## 2021-02-09 DIAGNOSIS — J101 Influenza due to other identified influenza virus with other respiratory manifestations: Secondary | ICD-10-CM | POA: Diagnosis not present

## 2021-02-10 DIAGNOSIS — J441 Chronic obstructive pulmonary disease with (acute) exacerbation: Secondary | ICD-10-CM | POA: Diagnosis not present

## 2021-02-10 DIAGNOSIS — Z9981 Dependence on supplemental oxygen: Secondary | ICD-10-CM | POA: Diagnosis not present

## 2021-02-10 DIAGNOSIS — I130 Hypertensive heart and chronic kidney disease with heart failure and stage 1 through stage 4 chronic kidney disease, or unspecified chronic kidney disease: Secondary | ICD-10-CM | POA: Diagnosis not present

## 2021-02-10 DIAGNOSIS — J101 Influenza due to other identified influenza virus with other respiratory manifestations: Secondary | ICD-10-CM | POA: Diagnosis not present

## 2021-02-10 DIAGNOSIS — J9811 Atelectasis: Secondary | ICD-10-CM | POA: Diagnosis not present

## 2021-02-10 DIAGNOSIS — I480 Paroxysmal atrial fibrillation: Secondary | ICD-10-CM | POA: Diagnosis not present

## 2021-02-10 DIAGNOSIS — Z7901 Long term (current) use of anticoagulants: Secondary | ICD-10-CM | POA: Diagnosis not present

## 2021-02-10 DIAGNOSIS — D72829 Elevated white blood cell count, unspecified: Secondary | ICD-10-CM | POA: Diagnosis not present

## 2021-02-10 DIAGNOSIS — J9601 Acute respiratory failure with hypoxia: Secondary | ICD-10-CM | POA: Diagnosis not present

## 2021-02-10 DIAGNOSIS — Z9181 History of falling: Secondary | ICD-10-CM | POA: Diagnosis not present

## 2021-02-10 DIAGNOSIS — N184 Chronic kidney disease, stage 4 (severe): Secondary | ICD-10-CM | POA: Diagnosis not present

## 2021-02-10 DIAGNOSIS — I5043 Acute on chronic combined systolic (congestive) and diastolic (congestive) heart failure: Secondary | ICD-10-CM | POA: Diagnosis not present

## 2021-02-10 DIAGNOSIS — H9190 Unspecified hearing loss, unspecified ear: Secondary | ICD-10-CM | POA: Diagnosis not present

## 2021-02-10 DIAGNOSIS — Z7951 Long term (current) use of inhaled steroids: Secondary | ICD-10-CM | POA: Diagnosis not present

## 2021-02-14 ENCOUNTER — Ambulatory Visit: Payer: Self-pay | Admitting: Oncology

## 2021-02-14 ENCOUNTER — Other Ambulatory Visit: Payer: PPO

## 2021-02-17 ENCOUNTER — Telehealth: Payer: Self-pay

## 2021-02-17 NOTE — Telephone Encounter (Signed)
Nicholas [name] with home health is requesting a goal for the heart rate. Heart rate yesterday was 120 after the pt had been sitting down for 30 mins. Nicholas [name] said the HR was regular and his BP was 126/70. Please advise.

## 2021-02-17 NOTE — Progress Notes (Incomplete)
[facility]  [address] [place],  [place]  [address] [phone]  Clinic Day:  02/17/2021  Referring physician: [name], [name], *  This document serves as a record of services personally performed by Marice Potter, MD. It was created on their behalf by Curry,Lauren E, a trained medical scribe. The creation of this record is based on the scribe's personal observations and the provider's statements to them.  HISTORY OF PRESENT ILLNESS:  The patient is a 88 y.o. male who I recently began seeing for anemia.  Labs 1 week ago showed a low hemoglobin of 9.2, with a low MCV of 69.  In the past, his MCV levels were in the mid 80's.  The patient denies having any overt forms of blood loss to explain his anemia.  He claims to have had a colonoscopy 3-4 years ago, which apparently came back normal.  The patient claims he has been taking a fourth of an iron tablet every other day for the past few months.  He denies there being a family history of anemia or other hematologic disorders.    PHYSICAL EXAM:  There were no vitals taken for this visit. Wt Readings from Last 3 Encounters:  02/04/21 182 lb 3.2 oz (82.6 kg)  01/31/21 176 lb 9.6 oz (80.1 kg)  01/28/21 173 lb 15.1 oz (78.9 kg)   There is no height or weight on file to calculate BMI. Performance status (ECOG): 1 - Symptomatic but completely ambulatory Physical Exam Constitutional:      Appearance: Normal appearance. He is not ill-appearing.  HENT:     Mouth/Throat:     Mouth: Mucous membranes are moist.     Pharynx: Oropharynx is clear. No oropharyngeal exudate or posterior oropharyngeal erythema.  Cardiovascular:     Rate and Rhythm: Normal rate and regular rhythm.     Heart sounds: No murmur heard.   No friction rub. No gallop.  Pulmonary:     Effort: Pulmonary effort is normal. No respiratory distress.     Breath sounds: Normal breath sounds. No wheezing, rhonchi or rales.  Abdominal:      General: Bowel sounds are normal. There is no distension.     Palpations: Abdomen is soft. There is no mass.     Tenderness: There is no abdominal tenderness.  Musculoskeletal:        General: No swelling.     Right lower leg: No edema.     Left lower leg: No edema.  Lymphadenopathy:     Cervical: No cervical adenopathy.     Upper Body:     Right upper body: No supraclavicular or axillary adenopathy.     Left upper body: No supraclavicular or axillary adenopathy.     Lower Body: No right inguinal adenopathy. No left inguinal adenopathy.  Skin:    General: Skin is warm.     Coloration: Skin is not jaundiced.     Findings: No lesion or rash.  Neurological:     General: No focal deficit present.     Mental Status: He is alert and oriented to person, place, and time. Mental status is at baseline.  Psychiatric:        Mood and Affect: Mood normal.        Behavior: Behavior normal.        Thought Content: Thought content normal.   LABS:    Ref. Range 11/04/2020 10:16  Iron Latest Ref Range: 45 - 182 ug/dL 16 (L)  UIBC Latest Units: ug/dL 406  TIBC Latest Ref Range: 250 - 450 ug/dL 422  Saturation Ratios Latest Ref Range: 17.9 - 39.5 % 4 (L)  Ferritin Latest Ref Range: 24 - 336 ng/mL 5 (L)  Folate Latest Ref Range: >5.9 ng/mL 44.1  Vitamin B12 Latest Ref Range: 180 - 914 pg/mL 307   ASSESSMENT & PLAN:  An 88 y.o. male whose labs clearly reflect iron deficiency anemia.  I will arrange for him to receive IV iron over these next few weeks to rapidly bolster his iron stores and improve his hemoglobin. As his labs are in stark contrast to what they were 3 years ago, I do feel this gentleman needs to be re-evaluated by GI to ensure his iron deficiency anemia is not due to any ominous, occult GI tract disease.  The patient and his daughter voiced understanding of this, but want to think more about it before officially seeking a GI referral.  I will see him back in 3 months to see how well he  responded to his upcoming IV iron.  The patient understands all the plans discussed today and is in agreement with them.  I, Rita Ohara, am acting as scribe for Marice Potter, MD    I have reviewed this report as typed by the medical scribe, and it is complete and accurate.  Dequincy Macarthur Critchley, MD

## 2021-02-18 ENCOUNTER — Encounter (HOSPITAL_BASED_OUTPATIENT_CLINIC_OR_DEPARTMENT_OTHER): Payer: Self-pay

## 2021-02-18 ENCOUNTER — Other Ambulatory Visit: Payer: Self-pay

## 2021-02-18 ENCOUNTER — Inpatient Hospital Stay (HOSPITAL_BASED_OUTPATIENT_CLINIC_OR_DEPARTMENT_OTHER)
Admission: EM | Admit: 2021-02-18 | Discharge: 2021-02-22 | DRG: 291 | Disposition: A | Discharge status: Hospice | Payer: PPO | Attending: Internal Medicine | Admitting: Internal Medicine

## 2021-02-18 ENCOUNTER — Emergency Department (HOSPITAL_BASED_OUTPATIENT_CLINIC_OR_DEPARTMENT_OTHER): Payer: PPO

## 2021-02-18 DIAGNOSIS — J449 Chronic obstructive pulmonary disease, unspecified: Secondary | ICD-10-CM

## 2021-02-18 DIAGNOSIS — I4891 Unspecified atrial fibrillation: Secondary | ICD-10-CM | POA: Diagnosis present

## 2021-02-18 DIAGNOSIS — E785 Hyperlipidemia, unspecified: Secondary | ICD-10-CM | POA: Diagnosis present

## 2021-02-18 DIAGNOSIS — N1832 Chronic kidney disease, stage 3b: Secondary | ICD-10-CM | POA: Diagnosis present

## 2021-02-18 DIAGNOSIS — J9620 Acute and chronic respiratory failure, unspecified whether with hypoxia or hypercapnia: Secondary | ICD-10-CM

## 2021-02-18 DIAGNOSIS — I4892 Unspecified atrial flutter: Secondary | ICD-10-CM | POA: Diagnosis present

## 2021-02-18 DIAGNOSIS — J9621 Acute and chronic respiratory failure with hypoxia: Secondary | ICD-10-CM | POA: Diagnosis present

## 2021-02-18 DIAGNOSIS — D509 Iron deficiency anemia, unspecified: Secondary | ICD-10-CM | POA: Diagnosis present

## 2021-02-18 DIAGNOSIS — I4510 Unspecified right bundle-branch block: Secondary | ICD-10-CM | POA: Diagnosis present

## 2021-02-18 DIAGNOSIS — H409 Unspecified glaucoma: Secondary | ICD-10-CM | POA: Diagnosis present

## 2021-02-18 DIAGNOSIS — Z880 Allergy status to penicillin: Secondary | ICD-10-CM

## 2021-02-18 DIAGNOSIS — I130 Hypertensive heart and chronic kidney disease with heart failure and stage 1 through stage 4 chronic kidney disease, or unspecified chronic kidney disease: Principal | ICD-10-CM | POA: Diagnosis present

## 2021-02-18 DIAGNOSIS — Z8249 Family history of ischemic heart disease and other diseases of the circulatory system: Secondary | ICD-10-CM

## 2021-02-18 DIAGNOSIS — I3139 Other pericardial effusion (noninflammatory): Secondary | ICD-10-CM | POA: Diagnosis present

## 2021-02-18 DIAGNOSIS — Z87891 Personal history of nicotine dependence: Secondary | ICD-10-CM | POA: Diagnosis not present

## 2021-02-18 DIAGNOSIS — I5041 Acute combined systolic (congestive) and diastolic (congestive) heart failure: Secondary | ICD-10-CM | POA: Diagnosis not present

## 2021-02-18 DIAGNOSIS — I471 Supraventricular tachycardia: Secondary | ICD-10-CM | POA: Diagnosis present

## 2021-02-18 DIAGNOSIS — Z515 Encounter for palliative care: Secondary | ICD-10-CM

## 2021-02-18 DIAGNOSIS — Z66 Do not resuscitate: Secondary | ICD-10-CM | POA: Diagnosis present

## 2021-02-18 DIAGNOSIS — I110 Hypertensive heart disease with heart failure: Secondary | ICD-10-CM | POA: Diagnosis not present

## 2021-02-18 DIAGNOSIS — I159 Secondary hypertension, unspecified: Secondary | ICD-10-CM | POA: Diagnosis not present

## 2021-02-18 DIAGNOSIS — Z7901 Long term (current) use of anticoagulants: Secondary | ICD-10-CM

## 2021-02-18 DIAGNOSIS — I5043 Acute on chronic combined systolic (congestive) and diastolic (congestive) heart failure: Secondary | ICD-10-CM | POA: Diagnosis present

## 2021-02-18 DIAGNOSIS — Z8673 Personal history of transient ischemic attack (TIA), and cerebral infarction without residual deficits: Secondary | ICD-10-CM

## 2021-02-18 DIAGNOSIS — R0602 Shortness of breath: Secondary | ICD-10-CM | POA: Diagnosis not present

## 2021-02-18 DIAGNOSIS — J441 Chronic obstructive pulmonary disease with (acute) exacerbation: Secondary | ICD-10-CM | POA: Diagnosis present

## 2021-02-18 DIAGNOSIS — I509 Heart failure, unspecified: Secondary | ICD-10-CM

## 2021-02-18 DIAGNOSIS — N1831 Chronic kidney disease, stage 3a: Secondary | ICD-10-CM | POA: Diagnosis present

## 2021-02-18 DIAGNOSIS — Z79899 Other long term (current) drug therapy: Secondary | ICD-10-CM

## 2021-02-18 DIAGNOSIS — Z20822 Contact with and (suspected) exposure to covid-19: Secondary | ICD-10-CM | POA: Diagnosis present

## 2021-02-18 DIAGNOSIS — N179 Acute kidney failure, unspecified: Secondary | ICD-10-CM | POA: Diagnosis present

## 2021-02-18 DIAGNOSIS — R54 Age-related physical debility: Secondary | ICD-10-CM | POA: Diagnosis present

## 2021-02-18 DIAGNOSIS — I517 Cardiomegaly: Secondary | ICD-10-CM | POA: Diagnosis not present

## 2021-02-18 DIAGNOSIS — Z96611 Presence of right artificial shoulder joint: Secondary | ICD-10-CM | POA: Diagnosis present

## 2021-02-18 DIAGNOSIS — E782 Mixed hyperlipidemia: Secondary | ICD-10-CM | POA: Diagnosis not present

## 2021-02-18 DIAGNOSIS — IMO0001 Reserved for inherently not codable concepts without codable children: Secondary | ICD-10-CM | POA: Diagnosis present

## 2021-02-18 DIAGNOSIS — M109 Gout, unspecified: Secondary | ICD-10-CM | POA: Diagnosis present

## 2021-02-18 DIAGNOSIS — R9431 Abnormal electrocardiogram [ECG] [EKG]: Secondary | ICD-10-CM | POA: Diagnosis present

## 2021-02-18 DIAGNOSIS — I10 Essential (primary) hypertension: Secondary | ICD-10-CM | POA: Diagnosis present

## 2021-02-18 LAB — URINALYSIS, ROUTINE W REFLEX MICROSCOPIC
Bilirubin Urine: NEGATIVE
Glucose, UA: NEGATIVE mg/dL
Hgb urine dipstick: NEGATIVE
Ketones, ur: NEGATIVE mg/dL
Leukocytes,Ua: NEGATIVE
Nitrite: NEGATIVE
Protein, ur: 30 mg/dL — AB
Specific Gravity, Urine: 1.022 (ref 1.005–1.030)
pH: 6.5 (ref 5.0–8.0)

## 2021-02-18 LAB — COMPREHENSIVE METABOLIC PANEL
ALT: 38 U/L (ref 0–44)
AST: 30 U/L (ref 15–41)
Albumin: 3.1 g/dL — ABNORMAL LOW (ref 3.5–5.0)
Alkaline Phosphatase: 131 U/L — ABNORMAL HIGH (ref 38–126)
Anion gap: 10 (ref 5–15)
BUN: 37 mg/dL — ABNORMAL HIGH (ref 8–23)
CO2: 26 mmol/L (ref 22–32)
Calcium: 9.2 mg/dL (ref 8.9–10.3)
Chloride: 103 mmol/L (ref 98–111)
Creatinine, Ser: 2.08 mg/dL — ABNORMAL HIGH (ref 0.61–1.24)
GFR, Estimated: 31 mL/min — ABNORMAL LOW (ref >60)
Glucose, Bld: 105 mg/dL — ABNORMAL HIGH (ref 70–99)
Potassium: 4.6 mmol/L (ref 3.5–5.1)
Sodium: 139 mmol/L (ref 135–145)
Total Bilirubin: 0.6 mg/dL (ref 0.3–1.2)
Total Protein: 5.7 g/dL — ABNORMAL LOW (ref 6.5–8.1)

## 2021-02-18 LAB — CBC WITH DIFFERENTIAL/PLATELET
Abs Immature Granulocytes: 0.15 10*3/uL — ABNORMAL HIGH (ref 0.00–0.07)
Basophils Absolute: 0 10*3/uL (ref 0.0–0.1)
Basophils Relative: 0 %
Eosinophils Absolute: 0.3 10*3/uL (ref 0.0–0.5)
Eosinophils Relative: 4 %
HCT: 40.5 % (ref 39.0–52.0)
Hemoglobin: 12.6 g/dL — ABNORMAL LOW (ref 13.0–17.0)
Immature Granulocytes: 2 %
Lymphocytes Relative: 21 %
Lymphs Abs: 1.9 10*3/uL (ref 0.7–4.0)
MCH: 23.9 pg — ABNORMAL LOW (ref 26.0–34.0)
MCHC: 31.1 g/dL (ref 30.0–36.0)
MCV: 76.7 fL — ABNORMAL LOW (ref 80.0–100.0)
Monocytes Absolute: 0.6 10*3/uL (ref 0.1–1.0)
Monocytes Relative: 7 %
Neutro Abs: 6.1 10*3/uL (ref 1.7–7.7)
Neutrophils Relative %: 66 %
Platelets: 329 10*3/uL (ref 150–400)
RBC: 5.28 MIL/uL (ref 4.22–5.81)
RDW: 25.5 % — ABNORMAL HIGH (ref 11.5–15.5)
WBC: 9.1 10*3/uL (ref 4.0–10.5)
nRBC: 0 % (ref 0.0–0.2)

## 2021-02-18 LAB — BRAIN NATRIURETIC PEPTIDE: B Natriuretic Peptide: 1096.1 pg/mL — ABNORMAL HIGH (ref 0.0–100.0)

## 2021-02-18 LAB — PROTIME-INR
INR: 1.6 — ABNORMAL HIGH (ref 0.8–1.2)
Prothrombin Time: 18.6 seconds — ABNORMAL HIGH (ref 11.4–15.2)

## 2021-02-18 LAB — LACTIC ACID, PLASMA: Lactic Acid, Venous: 1.1 mmol/L (ref 0.5–1.9)

## 2021-02-18 LAB — APTT: aPTT: 45 seconds — ABNORMAL HIGH (ref 24–36)

## 2021-02-18 MED ORDER — LEVALBUTEROL HCL 0.63 MG/3ML IN NEBU
0.6300 mg | INHALATION_SOLUTION | Freq: Once | RESPIRATORY_TRACT | Status: AC
  Administered 2021-02-18: 0.63 mg via RESPIRATORY_TRACT
  Filled 2021-02-18: qty 3

## 2021-02-18 MED ORDER — ALBUTEROL SULFATE HFA 108 (90 BASE) MCG/ACT IN AERS
2.0000 | INHALATION_SPRAY | RESPIRATORY_TRACT | Status: DC | PRN
  Filled 2021-02-18: qty 6.7

## 2021-02-18 MED ORDER — FUROSEMIDE 10 MG/ML IJ SOLN
20.0000 mg | Freq: Once | INTRAMUSCULAR | Status: AC
  Administered 2021-02-18: 20 mg via INTRAVENOUS
  Filled 2021-02-18: qty 2

## 2021-02-18 MED ORDER — METHYLPREDNISOLONE SODIUM SUCC 125 MG IJ SOLR
125.0000 mg | Freq: Once | INTRAMUSCULAR | Status: AC
  Administered 2021-02-19: 125 mg via INTRAVENOUS
  Filled 2021-02-18: qty 2

## 2021-02-18 MED ORDER — LORAZEPAM 2 MG/ML IJ SOLN
1.0000 mg | Freq: Once | INTRAMUSCULAR | Status: AC
  Administered 2021-02-19: 1 mg via INTRAVENOUS
  Filled 2021-02-18: qty 1

## 2021-02-18 MED ORDER — IPRATROPIUM BROMIDE 0.02 % IN SOLN
0.5000 mg | Freq: Once | RESPIRATORY_TRACT | Status: AC
  Administered 2021-02-18: 0.5 mg via RESPIRATORY_TRACT
  Filled 2021-02-18: qty 2.5

## 2021-02-18 NOTE — ED Notes (Signed)
RT placed pt on NIV/PSV/BIPAP w/settings of 10/5, BUR 15 and 30%. Pt tolerating well at this time. Pt had increased O2 demand and began to have increased WOB r/t CHF/COPD exacerbation. Pt respiratory status stable on NIV/BIPAP w/no distress noted post placement. RT will continue to monitor.

## 2021-02-18 NOTE — ED Triage Notes (Signed)
Pt came in c/o of [facility] starting yesterday that worsened today. Pt is on home o2 at 2L. Family did home COVID test that showed NEG results. Per patients daughter, she noted his o2 was in the 51's and she called pts palliative team. They recommended him coming to the ER. Pt has a hx of Afib/HF/COPD.

## 2021-02-18 NOTE — ED Notes (Signed)
Upon arrival pts Spo2 was 78% on 2L [place]. Pt is now on 5L [place] Spo2 94%

## 2021-02-18 NOTE — Telephone Encounter (Signed)
Nicholas [name] with [facility] is calling to say the pt does not feel well today and that he has had a negative COVID test at home. [name] reports the pt's O2 sat is staying at 90% on O2 whie at rest and his resting heart rate is 120 110/71. Please advise

## 2021-02-18 NOTE — ED Provider Notes (Signed)
[facility] EMERGENCY DEPT Provider Note   CSN: [id number] Arrival date & time: 02/18/21  1946     History  Chief Complaint  Patient presents with   Shortness of Breath    Nicholas [name] is a 88 y.o. male.  Patient with history of CHF COPD, presents with worsening shortness of breath at home.  Symptoms ongoing for the past 2 days.  Patient typically sees palliative care but his symptoms were too great to be managed with palliative care and he was recommended to come to the hospital.  He denies headache or chest pain.  Has had a increased cough for the past 2 days.  No fevers reported no vomiting or diarrhea reported.  At baseline he is on 2 L nasal cannula, upon arrival he was bumped up to 5 L nasal cannula.      Home Medications Prior to Admission medications   Medication Sig Start Date End Date Taking? Authorizing Provider  acetaminophen (TYLENOL) 500 MG tablet Take 500-1,000 mg by mouth every 8 (eight) hours as needed for mild pain or headache.    [provider]  albuterol (PROVENTIL HFA;VENTOLIN HFA) 108 (90 Base) MCG/ACT inhaler Inhale 2 puffs into the lungs every 4 (four) hours as needed for wheezing or shortness of breath.    [provider]  albuterol (PROVENTIL) (2.5 MG/3ML) 0.083% nebulizer solution Take 2.5 mg by nebulization every 4 (four) hours as needed for wheezing or shortness of breath.    [provider]  allopurinol (ZYLOPRIM) 100 MG tablet Take 100 mg by mouth daily.    [provider]  amiodarone (PACERONE) 200 MG tablet Take 1 tablet (200 mg total) by mouth 2 (two) times daily. 01/28/21   Alma Friendly, MD  apixaban (ELIQUIS) 2.5 MG TABS tablet Take 1 tablet (2.5 mg total) by mouth 2 (two) times daily. 07/21/18   Marcelyn Bruins, MD  Calcium Carb-Cholecalciferol (CALCIUM 600+D3 PO) Take 2 tablets by mouth daily.    [provider]  Ensure (ENSURE) Take 237 mLs by mouth daily.    [provider]  Fluticasone-Salmeterol (ADVAIR) 250-50 MCG/DOSE AEPB Inhale 1 puff into the lungs 2 (two) times daily.    [provider]  furosemide (LASIX) 40 MG tablet Take 1 tablet (40 mg total) by mouth every Monday, Wednesday, and Friday. 01/28/21 02/27/21  Alma Friendly, MD  latanoprost (XALATAN) 0.005 % ophthalmic solution Place 1 drop into both eyes at bedtime. 07/26/17   [provider]  Multiple Vitamin (MULTIVITAMIN WITH MINERALS) TABS tablet Take 1 tablet by mouth every morning.    [provider]  Omega-3 Fatty Acids (FISH OIL) 1000 MG CAPS Take 1,000 mg by mouth daily.    [provider]  OXYGEN Inhale 2 L into the lungs continuous.    [provider]  pantoprazole (PROTONIX) 20 MG tablet Take 1 tablet (20 mg total) by mouth daily. 04/01/18   Garvin Fila, MD  polyethylene glycol (MIRALAX / GLYCOLAX) 17 g packet Take 17 g by mouth daily. 01/29/21   Alma Friendly, MD      Allergies    Penicillins    Review of Systems   Review of Systems  Constitutional:  Negative for fever.  HENT:  Negative for ear pain and sore throat.   Eyes:  Negative for pain.  Respiratory:  Positive for cough and shortness of breath.   Cardiovascular:  Negative for chest pain.  Gastrointestinal:  Negative for abdominal pain.  Genitourinary:  Negative for flank pain.  Musculoskeletal:  Negative for back pain.  Skin:  Negative for color change and rash.  Neurological:  Negative for syncope.  All other systems reviewed and are negative.  Physical Exam Updated Vital Signs BP 133/78    Pulse (!) 120    Temp 98.3 F (36.8 C) (Oral)    Resp (!) 21    Ht 5\' 9"  (1.753 m)    Wt 85 kg    SpO2 95%    BMI 27.67 kg/m  Physical Exam Constitutional:      Appearance: He is well-developed.  HENT:     Head: Normocephalic.     Nose: Nose normal.  Eyes:     Extraocular Movements: Extraocular movements intact.  Cardiovascular:     Rate and Rhythm:  Tachycardia present.  Pulmonary:     Effort: Pulmonary effort is normal.     Breath sounds: Rales present.  Skin:    Coloration: Skin is not jaundiced.  Neurological:     Mental Status: He is alert. Mental status is at baseline.    ED Results / Procedures / Treatments   Labs (all labs ordered are listed, but only abnormal results are displayed) Labs Reviewed  COMPREHENSIVE METABOLIC PANEL - Abnormal; Notable for the following components:      Result Value   Glucose, Bld 105 (*)    BUN 37 (*)    Creatinine, Ser 2.08 (*)    Total Protein 5.7 (*)    Albumin 3.1 (*)    Alkaline Phosphatase 131 (*)    GFR, Estimated 31 (*)    All other components within normal limits  CBC WITH DIFFERENTIAL/PLATELET - Abnormal; Notable for the following components:   Hemoglobin 12.6 (*)    MCV 76.7 (*)    MCH 23.9 (*)    RDW 25.5 (*)    Abs Immature Granulocytes 0.15 (*)    All other components within normal limits  PROTIME-INR - Abnormal; Notable for the following components:   Prothrombin Time 18.6 (*)    INR 1.6 (*)    All other components within normal limits  APTT - Abnormal; Notable for the following components:   aPTT 45 (*)    All other components within normal limits  URINALYSIS, ROUTINE W REFLEX MICROSCOPIC - Abnormal; Notable for the following components:   Protein, ur 30 (*)    All other components within normal limits  BRAIN NATRIURETIC PEPTIDE - Abnormal; Notable for the following components:   B Natriuretic Peptide 1,096.1 (*)    All other components within normal limits  CULTURE, BLOOD (ROUTINE X 2)  CULTURE, BLOOD (ROUTINE X 2)  URINE CULTURE  LACTIC ACID, PLASMA    EKG EKG Interpretation  Date/Time:  Friday February 18 2021 20:09:53 EST Ventricular Rate:  121 PR Interval:  117 QRS Duration: 157 QT Interval:  360 QTC Calculation: 511 R Axis:   149 Text Interpretation: Sinus tachycardia RBBB and LPFB Confirmed by Thamas Jaegers ([id number]) on 02/18/2021 9:13:00  PM  Radiology DG Chest Port 1 View  Result Date: 02/18/2021 CLINICAL DATA:  Shortness of breath EXAM: PORTABLE CHEST 1 VIEW COMPARISON:  01/26/2021 FINDINGS: Transverse diameter of heart is increased. There is interval increase in interstitial markings in both lungs, more so in the upper lung fields. There is no focal consolidation. There is no pleural effusion or pneumothorax. There is previous right shoulder arthroplasty. IMPRESSION: Cardiomegaly. There is interval increase in interstitial markings in both lungs, more so in the  upper lung fields suggesting interstitial pulmonary edema or interstitial pneumonia. Electronically Signed   By: Elmer Picker M.D.   On: 02/18/2021 20:36    Procedures .Critical Care Performed by: Luna Fuse, MD Authorized by: Luna Fuse, MD   Critical care provider statement:    Critical care time (minutes):  40   Critical care was necessary to treat or prevent imminent or life-threatening deterioration of the following conditions:  Respiratory failure    Medications Ordered in ED Medications  albuterol (VENTOLIN HFA) 108 (90 Base) MCG/ACT inhaler 2 puff (has no administration in time range)  methylPREDNISolone sodium succinate (SOLU-MEDROL) 125 mg/2 mL injection 125 mg (has no administration in time range)  levalbuterol (XOPENEX) nebulizer solution 0.63 mg (0.63 mg Nebulization Given 02/18/21 2049)  ipratropium (ATROVENT) nebulizer solution 0.5 mg (0.5 mg Nebulization Given 02/18/21 2049)  furosemide (LASIX) injection 20 mg (20 mg Intravenous Given 02/18/21 2257)    ED Course/ Medical Decision Making/ A&P                           Medical Decision Making Amount and/or Complexity of Data Reviewed Labs: ordered. Radiology: ordered.  Risk Prescription drug management.   EKG appeared to be atrial fibrillation with a rate of 120 bpm.  No ST elevations or depressions are noted.  Review of record shows the patient was admitted in January for  similar symptoms of CHF COPD exacerbation.  proBNP again elevated here greater than thousand.  Patient given a breathing treatment here.  Blood pressure slightly low given 20 mg of Lasix.  Patient was doing well until he started to desaturate requiring BiPAP placement.  Hospitalist consulted for admission.        Final Clinical Impression(s) / ED Diagnoses Final diagnoses:  Acute on chronic congestive heart failure, unspecified heart failure type ([place])  Chronic obstructive pulmonary disease, unspecified COPD type ([place])  Acute on chronic respiratory failure, unspecified whether with hypoxia or hypercapnia [facility])    Rx / DC Orders ED Discharge Orders     None         Luna Fuse, MD 02/18/21 2337

## 2021-02-18 NOTE — Telephone Encounter (Signed)
Spoke to [place] just now and let him know Dr. Joya Gaskins recommendations. He verbalizes understanding and thanks me for the call back.

## 2021-02-18 NOTE — Telephone Encounter (Signed)
2/9 left message for Nicholas [name] with [facility] 2/10 left message for [name] with [facility]

## 2021-02-19 ENCOUNTER — Encounter (HOSPITAL_COMMUNITY): Payer: Self-pay | Admitting: Internal Medicine

## 2021-02-19 DIAGNOSIS — N179 Acute kidney failure, unspecified: Secondary | ICD-10-CM | POA: Diagnosis not present

## 2021-02-19 DIAGNOSIS — Z7901 Long term (current) use of anticoagulants: Secondary | ICD-10-CM | POA: Diagnosis not present

## 2021-02-19 DIAGNOSIS — I5041 Acute combined systolic (congestive) and diastolic (congestive) heart failure: Secondary | ICD-10-CM | POA: Diagnosis not present

## 2021-02-19 DIAGNOSIS — N1832 Chronic kidney disease, stage 3b: Secondary | ICD-10-CM | POA: Diagnosis present

## 2021-02-19 DIAGNOSIS — Z20822 Contact with and (suspected) exposure to covid-19: Secondary | ICD-10-CM | POA: Diagnosis not present

## 2021-02-19 DIAGNOSIS — Z880 Allergy status to penicillin: Secondary | ICD-10-CM | POA: Diagnosis not present

## 2021-02-19 DIAGNOSIS — R9431 Abnormal electrocardiogram [ECG] [EKG]: Secondary | ICD-10-CM | POA: Insufficient documentation

## 2021-02-19 DIAGNOSIS — Z8249 Family history of ischemic heart disease and other diseases of the circulatory system: Secondary | ICD-10-CM | POA: Diagnosis not present

## 2021-02-19 DIAGNOSIS — D509 Iron deficiency anemia, unspecified: Secondary | ICD-10-CM | POA: Diagnosis present

## 2021-02-19 DIAGNOSIS — J441 Chronic obstructive pulmonary disease with (acute) exacerbation: Secondary | ICD-10-CM | POA: Diagnosis present

## 2021-02-19 DIAGNOSIS — I471 Supraventricular tachycardia: Secondary | ICD-10-CM | POA: Diagnosis present

## 2021-02-19 DIAGNOSIS — I3139 Other pericardial effusion (noninflammatory): Secondary | ICD-10-CM | POA: Diagnosis not present

## 2021-02-19 DIAGNOSIS — J9621 Acute and chronic respiratory failure with hypoxia: Secondary | ICD-10-CM | POA: Diagnosis not present

## 2021-02-19 DIAGNOSIS — Z79899 Other long term (current) drug therapy: Secondary | ICD-10-CM | POA: Diagnosis not present

## 2021-02-19 DIAGNOSIS — J449 Chronic obstructive pulmonary disease, unspecified: Secondary | ICD-10-CM | POA: Diagnosis not present

## 2021-02-19 DIAGNOSIS — I4510 Unspecified right bundle-branch block: Secondary | ICD-10-CM | POA: Diagnosis present

## 2021-02-19 DIAGNOSIS — E785 Hyperlipidemia, unspecified: Secondary | ICD-10-CM | POA: Diagnosis present

## 2021-02-19 DIAGNOSIS — E782 Mixed hyperlipidemia: Secondary | ICD-10-CM | POA: Diagnosis not present

## 2021-02-19 DIAGNOSIS — IMO0001 Reserved for inherently not codable concepts without codable children: Secondary | ICD-10-CM | POA: Diagnosis present

## 2021-02-19 DIAGNOSIS — I4892 Unspecified atrial flutter: Secondary | ICD-10-CM | POA: Diagnosis not present

## 2021-02-19 DIAGNOSIS — Z96611 Presence of right artificial shoulder joint: Secondary | ICD-10-CM | POA: Diagnosis present

## 2021-02-19 DIAGNOSIS — N1831 Chronic kidney disease, stage 3a: Secondary | ICD-10-CM | POA: Diagnosis present

## 2021-02-19 DIAGNOSIS — Z66 Do not resuscitate: Secondary | ICD-10-CM | POA: Diagnosis present

## 2021-02-19 DIAGNOSIS — I159 Secondary hypertension, unspecified: Secondary | ICD-10-CM | POA: Diagnosis not present

## 2021-02-19 DIAGNOSIS — Z87891 Personal history of nicotine dependence: Secondary | ICD-10-CM | POA: Diagnosis not present

## 2021-02-19 DIAGNOSIS — Z515 Encounter for palliative care: Secondary | ICD-10-CM | POA: Diagnosis not present

## 2021-02-19 DIAGNOSIS — I4891 Unspecified atrial fibrillation: Secondary | ICD-10-CM | POA: Diagnosis present

## 2021-02-19 DIAGNOSIS — I130 Hypertensive heart and chronic kidney disease with heart failure and stage 1 through stage 4 chronic kidney disease, or unspecified chronic kidney disease: Secondary | ICD-10-CM | POA: Diagnosis present

## 2021-02-19 DIAGNOSIS — I5043 Acute on chronic combined systolic (congestive) and diastolic (congestive) heart failure: Secondary | ICD-10-CM | POA: Diagnosis not present

## 2021-02-19 DIAGNOSIS — H409 Unspecified glaucoma: Secondary | ICD-10-CM | POA: Diagnosis present

## 2021-02-19 LAB — RESP PANEL BY RT-PCR (FLU A&B, COVID) ARPGX2
Influenza A by PCR: NEGATIVE
Influenza B by PCR: NEGATIVE
SARS Coronavirus 2 by RT PCR: NEGATIVE

## 2021-02-19 LAB — COMPREHENSIVE METABOLIC PANEL
ALT: 35 U/L (ref 0–44)
AST: 28 U/L (ref 15–41)
Albumin: 2.9 g/dL — ABNORMAL LOW (ref 3.5–5.0)
Alkaline Phosphatase: 122 U/L (ref 38–126)
Anion gap: 10 (ref 5–15)
BUN: 39 mg/dL — ABNORMAL HIGH (ref 8–23)
CO2: 24 mmol/L (ref 22–32)
Calcium: 9 mg/dL (ref 8.9–10.3)
Chloride: 103 mmol/L (ref 98–111)
Creatinine, Ser: 1.96 mg/dL — ABNORMAL HIGH (ref 0.61–1.24)
GFR, Estimated: 33 mL/min — ABNORMAL LOW (ref >60)
Glucose, Bld: 140 mg/dL — ABNORMAL HIGH (ref 70–99)
Potassium: 4.3 mmol/L (ref 3.5–5.1)
Sodium: 137 mmol/L (ref 135–145)
Total Bilirubin: 0.6 mg/dL (ref 0.3–1.2)
Total Protein: 5.5 g/dL — ABNORMAL LOW (ref 6.5–8.1)

## 2021-02-19 LAB — CBC WITH DIFFERENTIAL/PLATELET
Abs Immature Granulocytes: 0.08 10*3/uL — ABNORMAL HIGH (ref 0.00–0.07)
Basophils Absolute: 0 10*3/uL (ref 0.0–0.1)
Basophils Relative: 0 %
Eosinophils Absolute: 0 10*3/uL (ref 0.0–0.5)
Eosinophils Relative: 0 %
HCT: 39.9 % (ref 39.0–52.0)
Hemoglobin: 12.5 g/dL — ABNORMAL LOW (ref 13.0–17.0)
Immature Granulocytes: 1 %
Lymphocytes Relative: 33 %
Lymphs Abs: 2 10*3/uL (ref 0.7–4.0)
MCH: 23.6 pg — ABNORMAL LOW (ref 26.0–34.0)
MCHC: 31.3 g/dL (ref 30.0–36.0)
MCV: 75.3 fL — ABNORMAL LOW (ref 80.0–100.0)
Monocytes Absolute: 0.1 10*3/uL (ref 0.1–1.0)
Monocytes Relative: 1 %
Neutro Abs: 4 10*3/uL (ref 1.7–7.7)
Neutrophils Relative %: 65 %
Platelets: 310 10*3/uL (ref 150–400)
RBC: 5.3 MIL/uL (ref 4.22–5.81)
RDW: 25.2 % — ABNORMAL HIGH (ref 11.5–15.5)
WBC: 6.2 10*3/uL (ref 4.0–10.5)
nRBC: 0 % (ref 0.0–0.2)

## 2021-02-19 MED ORDER — ARFORMOTEROL TARTRATE 15 MCG/2ML IN NEBU
15.0000 ug | INHALATION_SOLUTION | Freq: Two times a day (BID) | RESPIRATORY_TRACT | Status: DC
  Administered 2021-02-19 – 2021-02-22 (×6): 15 ug via RESPIRATORY_TRACT
  Filled 2021-02-19 (×6): qty 2

## 2021-02-19 MED ORDER — LATANOPROST 0.005 % OP SOLN
1.0000 [drp] | Freq: Every day | OPHTHALMIC | Status: DC
  Administered 2021-02-19 – 2021-02-21 (×3): 1 [drp] via OPHTHALMIC
  Filled 2021-02-19: qty 2.5

## 2021-02-19 MED ORDER — HYDRALAZINE HCL 20 MG/ML IJ SOLN: 5.0000 mg | INTRAMUSCULAR | Status: DC | PRN

## 2021-02-19 MED ORDER — PANTOPRAZOLE SODIUM 20 MG PO TBEC
20.0000 mg | DELAYED_RELEASE_TABLET | Freq: Every day | ORAL | Status: DC
Start: 2021-02-19 — End: 2021-02-22
  Administered 2021-02-19 – 2021-02-22 (×4): 20 mg via ORAL
  Filled 2021-02-19 (×4): qty 1

## 2021-02-19 MED ORDER — BUDESONIDE 0.25 MG/2ML IN SUSP
0.2500 mg | Freq: Two times a day (BID) | RESPIRATORY_TRACT | Status: DC
  Administered 2021-02-19 – 2021-02-22 (×6): 0.25 mg via RESPIRATORY_TRACT
  Filled 2021-02-19 (×6): qty 2

## 2021-02-19 MED ORDER — ACETAMINOPHEN 650 MG RE SUPP: 650.0000 mg | Freq: Four times a day (QID) | RECTAL | Status: DC | PRN

## 2021-02-19 MED ORDER — LORAZEPAM 2 MG/ML IJ SOLN
0.5000 mg | Freq: Once | INTRAMUSCULAR | Status: DC
  Filled 2021-02-19: qty 1

## 2021-02-19 MED ORDER — METOPROLOL TARTRATE 12.5 MG HALF TABLET
12.5000 mg | ORAL_TABLET | Freq: Two times a day (BID) | ORAL | Status: DC
  Administered 2021-02-19: 12.5 mg via ORAL
  Filled 2021-02-19: qty 1

## 2021-02-19 MED ORDER — SODIUM CHLORIDE 0.9% FLUSH
3.0000 mL | Freq: Two times a day (BID) | INTRAVENOUS | Status: DC
  Administered 2021-02-19 – 2021-02-22 (×7): 3 mL via INTRAVENOUS

## 2021-02-19 MED ORDER — APIXABAN 2.5 MG PO TABS
2.5000 mg | ORAL_TABLET | Freq: Two times a day (BID) | ORAL | Status: DC
  Administered 2021-02-19 – 2021-02-22 (×7): 2.5 mg via ORAL
  Filled 2021-02-19 (×7): qty 1

## 2021-02-19 MED ORDER — ACETAMINOPHEN 325 MG PO TABS
650.0000 mg | ORAL_TABLET | Freq: Four times a day (QID) | ORAL | Status: DC | PRN
  Administered 2021-02-20 – 2021-02-21 (×3): 650 mg via ORAL
  Filled 2021-02-19 (×3): qty 2

## 2021-02-19 MED ORDER — LORAZEPAM 2 MG/ML IJ SOLN: 0.5000 mg | INTRAMUSCULAR | Status: DC | PRN

## 2021-02-19 MED ORDER — MOMETASONE FURO-FORMOTEROL FUM 200-5 MCG/ACT IN AERO: 2.0000 | INHALATION_SPRAY | Freq: Two times a day (BID) | RESPIRATORY_TRACT | Status: DC

## 2021-02-19 MED ORDER — ONDANSETRON HCL 4 MG/2ML IJ SOLN: 4.0000 mg | Freq: Four times a day (QID) | INTRAMUSCULAR | Status: DC | PRN

## 2021-02-19 MED ORDER — FUROSEMIDE 10 MG/ML IJ SOLN
40.0000 mg | Freq: Once | INTRAMUSCULAR | Status: AC
  Administered 2021-02-19: 40 mg via INTRAVENOUS
  Filled 2021-02-19: qty 4

## 2021-02-19 MED ORDER — AMIODARONE HCL 200 MG PO TABS
200.0000 mg | ORAL_TABLET | Freq: Two times a day (BID) | ORAL | Status: DC
  Administered 2021-02-19 – 2021-02-22 (×7): 200 mg via ORAL
  Filled 2021-02-19 (×7): qty 1

## 2021-02-19 MED ORDER — ALLOPURINOL 100 MG PO TABS
100.0000 mg | ORAL_TABLET | Freq: Every day | ORAL | Status: DC
  Administered 2021-02-19 – 2021-02-22 (×4): 100 mg via ORAL
  Filled 2021-02-19 (×4): qty 1

## 2021-02-19 MED ORDER — POLYETHYLENE GLYCOL 3350 17 G PO PACK
17.0000 g | PACK | Freq: Every day | ORAL | Status: DC
  Administered 2021-02-19 – 2021-02-22 (×4): 17 g via ORAL
  Filled 2021-02-19 (×4): qty 1

## 2021-02-19 MED ORDER — ONDANSETRON HCL 4 MG PO TABS: 4.0000 mg | ORAL_TABLET | Freq: Four times a day (QID) | ORAL | Status: DC | PRN

## 2021-02-19 NOTE — ED Notes (Signed)
Called report to floor secretary to notify nurse to check purple man. RN at lunch a tthis time. Secretary aware pt is being transported by by [facility] at this time and to let nurse know to call with any questions.

## 2021-02-19 NOTE — ED Notes (Signed)
Report given to Forrest Moron, Therapist, sports at [facility] 3e

## 2021-02-19 NOTE — ED Notes (Signed)
Attempt to call report to floor. No answer.

## 2021-02-19 NOTE — Assessment & Plan Note (Signed)
-  Hold Zofran -Cautiously continue other meds - Amio, Albuterol, Dulera -Repeat EKG in AM

## 2021-02-19 NOTE — Assessment & Plan Note (Addendum)
No clinical acute exacerbation. Bronchodilator therapy and inhaled corticosteroids.  Supplemental oxygen at home, will add humidity to his oxygen delivery system.

## 2021-02-19 NOTE — Assessment & Plan Note (Deleted)
-  This appears to be his primary issue -He has had cardioversion x 3 recently but continues to relapse into aflutter and then develops RVR -Suspect inadequate perfusion leading to respiratory failure -He may have had mild volume overload on presentation but currently appears to be euvolemic -He is on Amiodarone per cardiology, appears likely to benefit from improving rate control -Continue Eliquis -Admit to progressive care -Consult cardiology

## 2021-02-19 NOTE — Progress Notes (Signed)
°   02/19/21 1333  Vitals  Temp 97.8 F (36.6 C)  Temp Source Oral  BP 123/80  MAP (mmHg) 94  BP Location Right Arm  BP Method Automatic  Patient Position (if appropriate) Lying  Pulse Rate (!) 116  Pulse Rate Source Monitor  ECG Heart Rate (!) 116  Resp 20  MEWS COLOR  MEWS Score Color Yellow  Oxygen Therapy  SpO2 (!) 88 %  O2 Device Nasal Cannula  O2 Flow Rate (L/min) 4 L/min  Pain Assessment  Pain Scale 0-10  Pain Score 0  Height and Weight  Height 5\' 9"  (1.753 m)  Weight 80.8 kg  Type of Scale Used Bed  Type of Weight Actual  BSA (Calculated - sq m) 1.98 sq meters  BMI (Calculated) 26.29  Weight in (lb) to have BMI = 25 168.9  MEWS Score  MEWS Temp 0  MEWS Systolic 0  MEWS Pulse 2  MEWS RR 0  MEWS LOC 0  MEWS Score 2   Patient is a chronic yellows MEWS during admission on to unit from [facility]. Patient has been tachycardiac and MD is aware. RN will give scheduled PO meds when meds reconciled. RN will initiate yellow MEWS protocol.

## 2021-02-19 NOTE — Assessment & Plan Note (Addendum)
Continue with Latanoprost

## 2021-02-19 NOTE — ED Provider Notes (Signed)
Pt boarding in ED pending admission. Pt continues to require BIPAP given significant CHF exacerbation, dyspnea. Becoming agitated while on BIPAP, lorazepam ordered. Pt compliance improved following this and continues to be able to tolerate BIPAP, respiratory status stable at this time. Adjust BIPAP as needed.    CRITICAL CARE Performed by: Jeanell Sparrow   Total critical care time: 34 minutes  Critical care time was exclusive of separately billable procedures and treating other patients.  Critical care was necessary to treat or prevent imminent or life-threatening deterioration.  Critical care was time spent personally by me on the following activities: development of treatment plan with patient and/or surrogate as well as nursing, discussions with consultants, evaluation of patient's response to treatment, examination of patient, obtaining history from patient or surrogate, ordering and performing treatments and interventions, ordering and review of laboratory studies, ordering and review of radiographic studies, pulse oximetry and re-evaluation of patient's condition. Respiratory failure requiring BIPAP; lorazepam given to help with agitation a/w BIPAP.     Jeanell Sparrow, DO 02/19/21 [phone]

## 2021-02-19 NOTE — Assessment & Plan Note (Addendum)
Patient has been tolerating well metoprolol with systolic blood pressure in the 100 range.

## 2021-02-19 NOTE — Assessment & Plan Note (Addendum)
Acute on chronic hypoxemic respiratory failure due to cardiogenic pulmonary edema.   Patient was placed on furosemide for diuresis with improvement in his volume status.   Echocardiogram from 01/2021 with reduced LV EF 25 to 30% with global hypokinesis, reduced RV systolic function mildly, moderate pericardial effusion. No significant valvular disease.   Plan to continue with metoprolol for heart failure management Continue holding on RAAS inhibition due to risk of hypotension.  Patient very weak and deconditioned, rapid decline in his health, follow up with palliative care as outpatient and possible transition to hospice as outpatient.

## 2021-02-19 NOTE — Consult Note (Addendum)
Cardiology Consultation:   Patient ID: BRAYANT DORR MRN: [id number]; DOB: Dec 23, 1933  Admit date: 02/18/2021 Date of Consult: 02/19/2021  PCP:  Street, [name], MD   [facility] Providers Cardiologist:  Shirlee More, MD        Patient Profile:   NASHTON BELSON is a 88 y.o. male with a hx of a flutter, HFmEF, CKD 3b, HTN, TIA IDA and gout who is being seen 02/19/2021 for the evaluation of recurrent a fib and CHF at the request of Dr. Lorin Mercy. .  History of Present Illness:   Mr. Pasternak was first found to be in a fib 09/2020, DCCV delayed due to anemia.  Iron Sats 4% and had 2 iron transfusions.  For DCCV 12.16/22 but volume overloaded diuresed and loaded on amio.  + DCCV successful on 12.22.22.  EF down from 55-60% to 40-45%.  Felt the reduced EF due to a flutter.  Discharge 12.22 and wt 191 lbs.  On followup he was in a fib at 98, lasix increased.  01/14/21 underwent successful DCCV.   Most recent echo with EF 25-30%.  01/17/21 + flu and placed on Tamiflu  Yesterday presented to ER with SOB.  Pt on home 02 at 2 L.  His pc02 was in the 80s so came to ER.    He was placed on BiPAP.   EKG:  The EKG was personally reviewed and demonstrates:  atrial fib with RVR at 121 RBBB and LPFB.  Telemetry:  Telemetry was personally reviewed and demonstrates:  a flutter with freq PVCs.    Na 137, K+ 4.3 glucose 140 BUN 39, Cr 1.96  was 2.29 on the 19th of Jan,  albumin 2.9  WBC 6.3 Hgb 12.5 plts 310 BNP 1096 Neg COVID  PCXR   IMPRESSION: Cardiomegaly. There is interval increase in interstitial markings in both lungs, more so in the upper lung fields suggesting interstitial pulmonary edema or interstitial pneumonia.   BP 123/80 P 115 R 20 afebrile.   No chest pain and no awareness of rapid HR  2-3 days prior to admit he could walk to mailbox without dyspnea.     Past Medical History:  Diagnosis Date   Anemia    none recent   Arthritis    Asthma    Chronic kidney disease    ckd  stage 3   Family history of adverse reaction to anesthesia    daughter has ponv    Glaucoma    Hypertension     Past Surgical History:  Procedure Laterality Date   CARDIOVERSION N/A 12/30/2020   Procedure: CARDIOVERSION;  Surgeon: Jerline Pain, MD;  Location: [facility] ENDOSCOPY;  Service: Cardiovascular;  Laterality: N/A;   CARDIOVERSION N/A 01/14/2021   Procedure: CARDIOVERSION;  Surgeon: Jolaine Artist, MD;  Location: [facility] ENDOSCOPY;  Service: Cardiovascular;  Laterality: N/A;   CARDIOVERSION N/A 01/27/2021   Procedure: CARDIOVERSION;  Surgeon: Jolaine Artist, MD;  Location: [facility] ENDOSCOPY;  Service: Cardiovascular;  Laterality: N/A;   COLONOSCOPY WITH PROPOFOL N/A 02/03/2015   Procedure: COLONOSCOPY WITH PROPOFOL;  Surgeon: Arta Silence, MD;  Location: [facility] ENDOSCOPY;  Service: Endoscopy;  Laterality: N/A;   KNEE ARTHROSCOPY Left    ROTATOR CUFF REPAIR Right    SHOULDER ARTHROSCOPY Left      Home Medications:  Prior to Admission medications   Medication Sig Start Date End Date Taking? Authorizing Provider  acetaminophen (TYLENOL) 500 MG tablet Take 500-1,000 mg by mouth every 8 (eight) hours as needed for  mild pain or headache.   Yes [provider]  albuterol (PROVENTIL HFA;VENTOLIN HFA) 108 (90 Base) MCG/ACT inhaler Inhale 2 puffs into the lungs every 4 (four) hours as needed for wheezing or shortness of breath.   Yes [provider]  allopurinol (ZYLOPRIM) 100 MG tablet Take 100 mg by mouth daily.   Yes [provider]  amiodarone (PACERONE) 200 MG tablet Take 1 tablet (200 mg total) by mouth 2 (two) times daily. 01/28/21  Yes Alma Friendly, MD  apixaban (ELIQUIS) 2.5 MG TABS tablet Take 1 tablet (2.5 mg total) by mouth 2 (two) times daily. 07/21/18  Yes Marcelyn Bruins, MD  Calcium Carb-Cholecalciferol (CALCIUM 600+D3 PO) Take 2 tablets by mouth daily.   Yes [provider]  Ensure (ENSURE) Take 237 mLs by mouth See admin instructions.  Drink 237 ml's by mouth one to two times a day   Yes [provider]  albuterol (PROVENTIL) (2.5 MG/3ML) 0.083% nebulizer solution Take 2.5 mg by nebulization every 4 (four) hours as needed for wheezing or shortness of breath.    [provider]  Fluticasone-Salmeterol (ADVAIR) 250-50 MCG/DOSE AEPB Inhale 1 puff into the lungs 2 (two) times daily.    [provider]  furosemide (LASIX) 40 MG tablet Take 1 tablet (40 mg total) by mouth every Monday, Wednesday, and Friday. 01/28/21 02/27/21  Alma Friendly, MD  latanoprost (XALATAN) 0.005 % ophthalmic solution Place 1 drop into both eyes at bedtime. 07/26/17   [provider]  Multiple Vitamin (MULTIVITAMIN WITH MINERALS) TABS tablet Take 1 tablet by mouth every morning.    [provider]  Omega-3 Fatty Acids (FISH OIL) 1000 MG CAPS Take 1,000 mg by mouth daily.    [provider]  OXYGEN Inhale 2 L into the lungs continuous.    [provider]  pantoprazole (PROTONIX) 20 MG tablet Take 1 tablet (20 mg total) by mouth daily. 04/01/18   Garvin Fila, MD  polyethylene glycol (MIRALAX / GLYCOLAX) 17 g packet Take 17 g by mouth daily. Patient taking differently: Take 17 g by mouth daily as needed for mild constipation (mix and drink as directed). 01/29/21   Alma Friendly, MD    Inpatient Medications: Scheduled Meds:  allopurinol  100 mg Oral Daily   amiodarone  200 mg Oral BID   apixaban  2.5 mg Oral BID   latanoprost  1 drop Both Eyes QHS   mometasone-formoterol  2 puff Inhalation BID   pantoprazole  20 mg Oral Daily   polyethylene glycol  17 g Oral Daily   sodium chloride flush  3 mL Intravenous Q12H   Continuous Infusions:  PRN Meds: acetaminophen **OR** acetaminophen, albuterol, hydrALAZINE, LORazepam  Allergies:    Allergies  Allergen Reactions   Penicillins Hives    Childhood allergy Has patient had a PCN reaction causing immediate rash, facial/tongue/throat  swelling, SOB or lightheadedness with hypotension: Yes Has patient had a PCN reaction causing severe rash involving mucus membranes or skin necrosis: Yes Has patient had a PCN reaction that required hospitalization: Yes Has patient had a PCN reaction occurring within the last 10 years: No If all of the above answers are "NO", then may proceed with Cephalosporin use.     Social History:   Social History   Socioeconomic History   Marital status: Widowed    Spouse name: Not on file   Number of children: 5   Years of education: 8   Highest education level:  Not on file  Occupational History   Occupation: Retired   Tobacco Use   Smoking status: Former    Packs/day: 1.00    Years: 30.00    Pack years: 30.00    Types: Cigarettes   Smokeless tobacco: Never   Tobacco comments:    quit 35 yrs ago  Substance and Sexual Activity   Alcohol use: No   Drug use: No   Sexual activity: Not on file  Other Topics Concern   Not on file  Social History Narrative   11/28/18 Lives with grandson, [name].    Caffeine use: daily   Right handed    Social Determinants of Health   Financial Resource Strain: Low Risk    Difficulty of Paying Living Expenses: Not very hard  Food Insecurity: No Food Insecurity   Worried About Charity fundraiser in the Last Year: Never true   Ran Out of Food in the Last Year: Never true  Transportation Needs: No Transportation Needs   Lack of Transportation (Medical): No   Lack of Transportation (Non-Medical): No  Physical Activity: Not on file  Stress: Not on file  Social Connections: Not on file  Intimate Partner Violence: Not on file    Family History:    Family History  Problem Relation Age of Onset   Hypertension Mother    Hypertension Father      ROS:  Please see the history of present illness.  General:no colds or fevers, no weight changes Skin:no rashes or ulcers HEENT:no blurred vision, no congestion CV:see HPI PUL:see HPI GI:no diarrhea  constipation or melena, no indigestion GU:no hematuria, no dysuria MS:no joint pain, no claudication Neuro:no syncope, no lightheadedness Endo:no diabetes, no thyroid disease  All other ROS reviewed and negative.     Physical Exam/Data:   Vitals:   02/19/21 1130 02/19/21 1149 02/19/21 1215 02/19/21 1333  BP: (!) 114/99  (!) 127/93 123/80  Pulse: (!) 113 (!) 116 (!) 115 (!) 116  Resp: (!) 23 15 20 20   Temp:    97.8 F (36.6 C)  TempSrc:    Oral  SpO2: (!) 89% (!) 88% 90% (!) 88%  Weight:    80.8 kg  Height:    5\' 9"  (1.753 m)    Intake/Output Summary (Last 24 hours) at 02/19/2021 1507 Last data filed at 02/19/2021 1354 Gross per 24 hour  Intake --  Output 1505 ml  Net -1505 ml   Last 3 Weights 02/19/2021 02/18/2021 02/04/2021  Weight (lbs) 178 lb 2.1 oz 187 lb 6.3 oz 182 lb 3.2 oz  Weight (kg) 80.8 kg 85 kg 82.645 kg     Body mass index is 26.31 kg/m.  General:  frail male, in no acute distress HEENT: normal except hard of hearing Neck: + JVD Vascular: No carotid bruits; Distal pulses 2+ bilaterally Cardiac:  rapid and irregular; no murmur gallup rub or click Lungs:  diminished to auscultation bilaterally, no wheezing, rhonchi or rales  Abd: soft, nontender, no hepatomegaly  Ext: no edema Musculoskeletal:  No deformities, BUE and BLE strength normal and equal Skin: warm and dry  Neuro:  alert and oriented X 3 MAE follows commands, no focal abnormalities noted Psych:  Normal affect    Relevant CV Studies: Cardiac Testing  Echo 12/2020 EF 40-45%  Echo 2020 EF 55-60%  Echo 2019 Ef 50-55%   Echo 01/20/21 IMPRESSIONS     1. Left ventricular ejection fraction, by estimation, is 25 to 30%. The  left ventricle has  severely decreased function. The left ventricle  demonstrates global hypokinesis. The left ventricular internal cavity size  was mildly dilated. Left ventricular  diastolic parameters are consistent with Grade I diastolic dysfunction  (impaired relaxation).    2. Right ventricular systolic function is mildly reduced. The right  ventricular size is normal. Tricuspid regurgitation signal is inadequate  for assessing PA pressure.   3. Left atrial size was moderately dilated.   4. The mitral valve is degenerative. Mild mitral valve regurgitation. No  evidence of mitral stenosis.   5. The aortic valve is tricuspid. Aortic valve regurgitation is mild.  Mild to moderate aortic valve stenosis. Aortic valve area, by VTI measures  1.68 cm. Aortic valve mean gradient measures 15.0 mmHg.   6. The inferior vena cava is normal in size with greater than 50%  respiratory variability, suggesting right atrial pressure of 3 mmHg.   7. Moderate pericardial effusion, primarily adjacent to the RA and RV  (small elsewhere). There is no evidence of cardiac tamponade.   FINDINGS   Left Ventricle: Left ventricular ejection fraction, by estimation, is 25  to 30%. The left ventricle has severely decreased function. The left  ventricle demonstrates global hypokinesis. The left ventricular internal  cavity size was mildly dilated. There  is no left ventricular hypertrophy. Left ventricular diastolic parameters  are consistent with Grade I diastolic dysfunction (impaired relaxation).   Right Ventricle: The right ventricular size is normal. No increase in  right ventricular wall thickness. Right ventricular systolic function is  mildly reduced. Tricuspid regurgitation signal is inadequate for assessing  PA pressure.   Left Atrium: Left atrial size was moderately dilated.   Right Atrium: Right atrial size was normal in size.   Pericardium: A moderately sized pericardial effusion is present. There is  no evidence of cardiac tamponade.   Mitral Valve: The mitral valve is degenerative in appearance. There is  mild calcification of the mitral valve leaflet(s). Mild to moderate mitral  annular calcification. Mild mitral valve regurgitation. No evidence of  mitral valve  stenosis.   Tricuspid Valve: The tricuspid valve is normal in structure. Tricuspid  valve regurgitation is not demonstrated.   Aortic Valve: The aortic valve is tricuspid. Aortic valve regurgitation is  mild. Mild to moderate aortic stenosis is present. Aortic valve mean  gradient measures 15.0 mmHg. Aortic valve peak gradient measures 20.2  mmHg. Aortic valve area, by VTI measures   1.68 cm.   Pulmonic Valve: The pulmonic valve was normal in structure. Pulmonic valve  regurgitation is not visualized.  Laboratory Data:  High Sensitivity Troponin:  No results for input(s): TROPONINIHS in the last 720 hours.   Chemistry Recent Labs  Lab 02/18/21 2021 02/19/21 0750  NA 139 137  K 4.6 4.3  CL 103 103  CO2 26 24  GLUCOSE 105* 140*  BUN 37* 39*  CREATININE 2.08* 1.96*  CALCIUM 9.2 9.0  GFRNONAA 31* 33*  ANIONGAP 10 10    Recent Labs  Lab 02/18/21 2021 02/19/21 0750  PROT 5.7* 5.5*  ALBUMIN 3.1* 2.9*  AST 30 28  ALT 38 35  ALKPHOS 131* 122  BILITOT 0.6 0.6   Lipids No results for input(s): CHOL, TRIG, HDL, LABVLDL, LDLCALC, CHOLHDL in the last 168 hours.  Hematology Recent Labs  Lab 02/18/21 2021 02/19/21 0750  WBC 9.1 6.2  RBC 5.28 5.30  HGB 12.6* 12.5*  HCT 40.5 39.9  MCV 76.7* 75.3*  MCH 23.9* 23.6*  MCHC 31.1 31.3  RDW 25.5* 25.2*  PLT 329 310   Thyroid No results for input(s): TSH, FREET4 in the last 168 hours.  BNP Recent Labs  Lab 02/18/21 2038  BNP 1,096.1*    DDimer No results for input(s): DDIMER in the last 168 hours.   Radiology/Studies:  DG Chest Port 1 View  Result Date: 02/18/2021 CLINICAL DATA:  Shortness of breath EXAM: PORTABLE CHEST 1 VIEW COMPARISON:  01/26/2021 FINDINGS: Transverse diameter of heart is increased. There is interval increase in interstitial markings in both lungs, more so in the upper lung fields. There is no focal consolidation. There is no pleural effusion or pneumothorax. There is previous right shoulder  arthroplasty. IMPRESSION: Cardiomegaly. There is interval increase in interstitial markings in both lungs, more so in the upper lung fields suggesting interstitial pulmonary edema or interstitial pneumonia. Electronically Signed   By: Elmer Picker M.D.   On: 02/18/2021 20:36     Assessment and Plan:   Acute CHF combined systolic and diastolic, EF continues to decrease most recent 25-30%/acute resp failure with hypoxia.  Placed on Bipap, now on 3 L [place].    Recurrent a fib/flutter.  With RVR,  has had 3 DCCV but recurrent a flutter and is on amiodarone 200 BID.  No ischemic work up - CKD 3b to 4  GFR 33 today   on Eliquis 2.5 BID.  Continues with tachycardia. ? Add BB will defer to Dr. Percival Spanish.  Chronic hypoxic respiratory failure on home 02  HTN stable   IDA hx of Iron sat 4% and has hx of iron transfusions X 2   Pt is DNR and has palliative care.      Risk Assessment/Risk Scores:        New York Heart Association (NYHA) Functional Class NYHA Class III  CHA2DS2-VASc Score = 5   This indicates a 7.2% annual risk of stroke. The patient's score is based upon: CHF History: 1 HTN History: 1 Diabetes History: 0 Stroke History: 0 Vascular Disease History: 1 Age Score: 2 Gender Score: 0      For questions or updates, please contact [facility] Please consult [email or web address] for contact info under    Signed, Cecilie Kicks, NP  02/19/2021 3:07 PM  History and all data above reviewed.  Patient examined.  I agree with the findings as above.  The patient is well-known to our service.  He has a history of recurrent atrial fibrillation and despite recent cardioversion did not maintain sinus rhythm.  He did well after the his most recent cardioversion until a few days ago when he started having increasing shortness of breath.  Yesterday he was short of breath walking through his house.  He takes his oxygen off to go to the bathroom.  His sats fell into the 70s.  He was not describing  necessarily PND or orthopnea.  He is not describing chest discomfort.  He would not really know that he is in atrial fibrillation.  He did present with acute shortness of breath from was hypoxemic as listed above.   The patient exam reveals [id number]   ,  Lungs: Decreased breath sounds bilaterally  ,  Abd: Positive bowel sounds, no rebound no guarding, Ext No edema  .  All available labs, radiology testing, previous records reviewed. Agree with documented assessment and plan.   Atrial fib: Maintaining sinus rhythm is not an option.  Perhaps he will feel better with rate control alone.  He does not seem to be overtly volume overloaded and his weight has  been stable at home.  He had to take up dose or 2 increased diuretic.  However, suspect this is acute on chronic heart failure with his ejection fraction following related to his rate.  We will start metoprolol and uptitrate.  I would continue on amiodarone for now for rate control the week likely will be able to back off on this since rhythm management is not the goal.  Avoiding ACE inhibitors or ARB's because of his elevated creatinine.  We can slowly titrate isosorbide/nitrates.     Jeneen Rinks Reniyah Gootee  5:06 PM  02/19/2021

## 2021-02-19 NOTE — ED Notes (Signed)
[facility] out of ED with pt at this time to go to [facility].

## 2021-02-19 NOTE — Assessment & Plan Note (Addendum)
Continue with statin at home

## 2021-02-19 NOTE — H&P (Addendum)
History and Physical    Patient: Nicholas [name] SJG:[id number] DOB: 04/12/1933 DOA: 02/18/2021 DOS: the patient was seen and examined on 02/19/2021 PCP: Street, [name], MD  Patient coming from: Home - lives with grandson and his wife; NOK: Daughter, Durenda Age, [phone]   Chief Complaint: SOB  HPI: Nicholas [name] is a 88 y.o. male with medical history significant of HTN; stage 3b CKD; chronic systolic CHF; atrial flutter presenting with SOB.  He was admitted from 12/16-22 for acute on chronic CHF, exacerbated by aflutter with RVR and was cardioverted.  He was cardioverted again on 1/6.   He returned from 1/10-20 with COPD exacerbation and was cardioverted again on 1/19.  He presented to the ER yesterday after calling his palliative care team and being told to come in; he was placed on BIPAP due to O2 sats in the 70s on 2L [place] O2.  He is accompanied by his daughter and is currently on [place] O2.  He was feeling poorly throughout the day yesterday with O2 sats as low as 70 with ambulation.  He was unable to do much due to SOB.  He had previously diuresed well enough that Dr. Bettina Gavia changed his Lasix to prn weight 177+ and his last dose was Wednesday.  He has not had more edema.  No cough.  His HR remains elevated; last week it was in the 107 range but it increased and has been in the 115-120 range all week.    ER Course:  [facility], per Dr. Myna Hidalgo:  Accepted from [facility] for acute on chronic respiratory failure. He is an 88 yr old with COPD, chronic combined CHF with last EF 25-30%, a fib on Eliquis, chronic 2 Lpm O2 requirement, and CKD IV who presents with worsening SOB and hypoxia. He was saturating 70s on 2 lpm with sinus tachyardia, rate 120s. Renal function appears stable, BNP similar to priors but increased interstitial markings on CXR suggesting increased pulm edema. He was placed on BiPAP and given IV Lasix, IV Solu-Medrol, nebs.      Review of Systems: As mentioned in the  history of present illness. All other systems reviewed and are negative. Past Medical History:  Diagnosis Date   Anemia    none recent   Arthritis    Asthma    Chronic kidney disease    ckd stage 3   Family history of adverse reaction to anesthesia    daughter has ponv    Glaucoma    Hypertension    Past Surgical History:  Procedure Laterality Date   CARDIOVERSION N/A 12/30/2020   Procedure: CARDIOVERSION;  Surgeon: Jerline Pain, MD;  Location: [facility] ENDOSCOPY;  Service: Cardiovascular;  Laterality: N/A;   CARDIOVERSION N/A 01/14/2021   Procedure: CARDIOVERSION;  Surgeon: Jolaine Artist, MD;  Location: [facility] ENDOSCOPY;  Service: Cardiovascular;  Laterality: N/A;   CARDIOVERSION N/A 01/27/2021   Procedure: CARDIOVERSION;  Surgeon: Jolaine Artist, MD;  Location: [facility] ENDOSCOPY;  Service: Cardiovascular;  Laterality: N/A;   COLONOSCOPY WITH PROPOFOL N/A 02/03/2015   Procedure: COLONOSCOPY WITH PROPOFOL;  Surgeon: Arta Silence, MD;  Location: [facility] ENDOSCOPY;  Service: Endoscopy;  Laterality: N/A;   KNEE ARTHROSCOPY Left    ROTATOR CUFF REPAIR Right    SHOULDER ARTHROSCOPY Left    Social History:  reports that he has quit smoking. His smoking use included cigarettes. He has a 30.00 pack-year smoking history. He has never used smokeless tobacco. He reports that he does not drink alcohol  and does not use drugs.  Allergies  Allergen Reactions   Penicillins Hives    Childhood allergy Has patient had a PCN reaction causing immediate rash, facial/tongue/throat swelling, SOB or lightheadedness with hypotension: Yes Has patient had a PCN reaction causing severe rash involving mucus membranes or skin necrosis: Yes Has patient had a PCN reaction that required hospitalization: Yes Has patient had a PCN reaction occurring within the last 10 years: No If all of the above answers are "NO", then may proceed with Cephalosporin use.     Family History  Problem Relation Age of Onset    Hypertension Mother    Hypertension Father     Prior to Admission medications   Medication Sig Start Date End Date Taking? Authorizing Provider  acetaminophen (TYLENOL) 500 MG tablet Take 500-1,000 mg by mouth every 8 (eight) hours as needed for mild pain or headache.    [provider]  albuterol (PROVENTIL HFA;VENTOLIN HFA) 108 (90 Base) MCG/ACT inhaler Inhale 2 puffs into the lungs every 4 (four) hours as needed for wheezing or shortness of breath.    [provider]  albuterol (PROVENTIL) (2.5 MG/3ML) 0.083% nebulizer solution Take 2.5 mg by nebulization every 4 (four) hours as needed for wheezing or shortness of breath.    [provider]  allopurinol (ZYLOPRIM) 100 MG tablet Take 100 mg by mouth daily.    [provider]  amiodarone (PACERONE) 200 MG tablet Take 1 tablet (200 mg total) by mouth 2 (two) times daily. 01/28/21   Alma Friendly, MD  apixaban (ELIQUIS) 2.5 MG TABS tablet Take 1 tablet (2.5 mg total) by mouth 2 (two) times daily. 07/21/18   Marcelyn Bruins, MD  Calcium Carb-Cholecalciferol (CALCIUM 600+D3 PO) Take 2 tablets by mouth daily.    [provider]  Ensure (ENSURE) Take 237 mLs by mouth daily.    [provider]  Fluticasone-Salmeterol (ADVAIR) 250-50 MCG/DOSE AEPB Inhale 1 puff into the lungs 2 (two) times daily.    [provider]  furosemide (LASIX) 40 MG tablet Take 1 tablet (40 mg total) by mouth every Monday, Wednesday, and Friday. 01/28/21 02/27/21  Alma Friendly, MD  latanoprost (XALATAN) 0.005 % ophthalmic solution Place 1 drop into both eyes at bedtime. 07/26/17   [provider]  Multiple Vitamin (MULTIVITAMIN WITH MINERALS) TABS tablet Take 1 tablet by mouth every morning.    [provider]  Omega-3 Fatty Acids (FISH OIL) 1000 MG CAPS Take 1,000 mg by mouth daily.    [provider]  OXYGEN Inhale 2 L into the lungs continuous.    [provider]   pantoprazole (PROTONIX) 20 MG tablet Take 1 tablet (20 mg total) by mouth daily. 04/01/18   Garvin Fila, MD  polyethylene glycol (MIRALAX / GLYCOLAX) 17 g packet Take 17 g by mouth daily. 01/29/21   Alma Friendly, MD    Physical Exam: Vitals:   02/19/21 1130 02/19/21 1149 02/19/21 1215 02/19/21 1333  BP: (!) 114/99  (!) 127/93 123/80  Pulse: (!) 113 (!) 116 (!) 115 (!) 116  Resp: (!) 23 15 20 20   Temp:    97.8 F (36.6 C)  TempSrc:    Oral  SpO2: (!) 89% (!) 88% 90% (!) 88%  Weight:    80.8 kg  Height:    5\' 9"  (1.753 m)   General:  Appears calm and comfortable and is in NAD, on [place] O2 Eyes:  EOMI, normal lids, iris ENT:  hard of hearing, grossly normal lips & tongue, mmm; artificial dentition Neck:  no LAD, masses or thyromegaly Cardiovascular:  Irregularly irregular with tachycardia, no m/r/g. 1+ R > L primarily ankle edema.  Respiratory:   CTA bilaterally with no wheezes/rales/rhonchi.  Normal to mildly increased respiratory effort. Abdomen:  soft, NT, ND Skin:  no rash or induration seen on limited exam Musculoskeletal:  grossly normal tone BUE/BLE, good ROM, no bony abnormality Psychiatric:  grossly normal mood and affect, speech fluent and appropriate, AOx3 Neurologic:  CN 2-12 grossly intact, moves all extremities in coordinated fashion   Radiological Exams on Admission: Independently reviewed - see discussion in A/P where applicable  DG Chest Port 1 View  Result Date: 02/18/2021 CLINICAL DATA:  Shortness of breath EXAM: PORTABLE CHEST 1 VIEW COMPARISON:  01/26/2021 FINDINGS: Transverse diameter of heart is increased. There is interval increase in interstitial markings in both lungs, more so in the upper lung fields. There is no focal consolidation. There is no pleural effusion or pneumothorax. There is previous right shoulder arthroplasty. IMPRESSION: Cardiomegaly. There is interval increase in interstitial markings in both lungs, more so in the upper lung fields  suggesting interstitial pulmonary edema or interstitial pneumonia. Electronically Signed   By: Elmer Picker M.D.   On: 02/18/2021 20:36    EKG: Independently reviewed.  Sinus tachycardia with rate 121; prolonged QTc 511; RBBB and LPFB   Labs on Admission: I have personally reviewed the available labs and imaging studies at the time of the admission.  Pertinent labs:    Glucose 140 BN 39/Creatinine 1.96/GFR 33 - stable WBC 6.2 Hgb 12.5 - stable BNP 1096.1 - improved Lactate 1.1 COVID/flu negative UA: 30 protein    Assessment and Plan: * Acute on chronic respiratory failure with hypoxia (HCC)- (present on admission) -Patient with h/o COPD and chronic combined CHF presenting with hypoxia to 70s despite home O2 -He was started on BIPAP and has since stabilized, currently on 3L [place] O2 -Patient admitted for further evaluation/treatment  Atrial flutter with rapid ventricular response ([place])- (present on admission) -This appears to be his primary issue -He has had cardioversion x 3 recently but continues to relapse into aflutter and then develops RVR -Suspect inadequate perfusion leading to respiratory failure -He may have had mild volume overload on presentation but currently appears to be euvolemic -He is on Amiodarone per cardiology, appears likely to benefit from improving rate control -Continue Eliquis -Admit to progressive care -Consult cardiology  Acute combined systolic and diastolic congestive heart failure ([place])- (present on admission) -Patient with known h/o chronic combined CHF - EF 25-30% and grade 1 diastolic dysfunction on 1/61/09 -He has DOE but minimal weight gain, no cough, no significant LE edema -CXR with mild edema -BNP is improved from prior hospitalization -He was given Lasix at [facility] with improvement; will hold further Lasix for now as he appears to be euvolemic -Previously on Lasix M/W/F, no ACE due to CKD stage IV; currently on prn Lasix based on  weight as an outpatient  Prolonged QT interval- (present on admission) -Hold Zofran -Cautiously continue other meds - Amio, Albuterol, Dulera -Repeat EKG in AM  DNR (do not resuscitate)- (present on admission) -I have discussed code status with the patient and his daughter and  they are in agreement that the patient would not desire resuscitation and would prefer to die a natural death should that situation arise. -He will need a gold out of facility DNR form at the time of discharge  Chronic  kidney disease, stage 3b ([place])- (present on admission) -Baseline stage 3b CKD -Will follow BMP during diuresis  COPD not affecting current episode of care [facility])- (present on admission) -Previously here for COPD exacerbation but he does not have respiratory symptoms other than DOE at this time -Discharged previously on 2L [place] O2, currently on 3L -Continue Advair (Dulera per formulary) -Continue prn Albuterol  Glaucoma- (present on admission) -Continue Latanoprost  Hyperlipidemia- (present on admission) -He does not appear to be taking medications for this issue at this time  HTN (hypertension)- (present on admission) -He is not current on BP medications -Will cover with prn IV hydralazine      Advance Care Planning:   Code Status: DNR   Consults: Cardiology  DVT Prophylaxis: Eliquis  Family Communication: Daughter was present throughout evaluation  Severity of Illness: The appropriate patient status for this patient is INPATIENT. Inpatient status is judged to be reasonable and necessary in order to provide the required intensity of service to ensure the patient's safety. The patient's presenting symptoms, physical exam findings, and initial radiographic and laboratory data in the context of their chronic comorbidities is felt to place them at high risk for further clinical deterioration. Furthermore, it is not anticipated that the patient will be medically stable for discharge from the  hospital within 2 midnights of admission.   * I certify that at the point of admission it is my clinical judgment that the patient will require inpatient hospital care spanning beyond 2 midnights from the point of admission due to high intensity of service, high risk for further deterioration and high frequency of surveillance required.*  Author: Karmen Bongo, MD 02/19/2021 3:03 PM  For on call review [email or web address].

## 2021-02-19 NOTE — Assessment & Plan Note (Addendum)
Patient very weak and deconditioned, multiple medical problems and recurrent hospitalizations. Code status is DNR.   I spoke with his grandson, (care giver), acknowledges frequent re-hospitalizations. Progressive decline in his health and quality of life.  Patient will continue under palliative care and possible transition to hospice as outpatient.

## 2021-02-19 NOTE — Assessment & Plan Note (Addendum)
Patient received diuresis, with improvement in his volume status. At the time of discharge his serum cr is 2,36 with K at 3,9 and serum bicarbonate at 24.  Plan to continue diuresis with furosemide 40 mg daily and instructions to increase to bid in case of edema or worsening dyspnea, weight gain 3 lbs in 48 hrs or 5 lbs in 7 days.

## 2021-02-19 NOTE — ED Notes (Signed)
Pt states he needs to have a BM. Placed on a bedpan at this time. Call bell in reach. Bed in lowest locked position.

## 2021-02-19 NOTE — Assessment & Plan Note (Deleted)
-  Patient with h/o COPD and chronic combined CHF presenting with hypoxia to 70s despite home O2 -He was started on BIPAP and has since stabilized, currently on 3L [place] O2 -Patient admitted for further evaluation/treatment

## 2021-02-20 DIAGNOSIS — E782 Mixed hyperlipidemia: Secondary | ICD-10-CM

## 2021-02-20 LAB — CBC
HCT: 39.6 % (ref 39.0–52.0)
Hemoglobin: 12.6 g/dL — ABNORMAL LOW (ref 13.0–17.0)
MCH: 24.2 pg — ABNORMAL LOW (ref 26.0–34.0)
MCHC: 31.8 g/dL (ref 30.0–36.0)
MCV: 76 fL — ABNORMAL LOW (ref 80.0–100.0)
Platelets: 413 10*3/uL — ABNORMAL HIGH (ref 150–400)
RBC: 5.21 MIL/uL (ref 4.22–5.81)
RDW: 25 % — ABNORMAL HIGH (ref 11.5–15.5)
WBC: 14.7 10*3/uL — ABNORMAL HIGH (ref 4.0–10.5)
nRBC: 0 % (ref 0.0–0.2)

## 2021-02-20 LAB — BASIC METABOLIC PANEL
Anion gap: 12 (ref 5–15)
BUN: 46 mg/dL — ABNORMAL HIGH (ref 8–23)
CO2: 23 mmol/L (ref 22–32)
Calcium: 9 mg/dL (ref 8.9–10.3)
Chloride: 101 mmol/L (ref 98–111)
Creatinine, Ser: 2.15 mg/dL — ABNORMAL HIGH (ref 0.61–1.24)
GFR, Estimated: 29 mL/min — ABNORMAL LOW (ref >60)
Glucose, Bld: 114 mg/dL — ABNORMAL HIGH (ref 70–99)
Potassium: 4.5 mmol/L (ref 3.5–5.1)
Sodium: 136 mmol/L (ref 135–145)

## 2021-02-20 LAB — URINE CULTURE: Culture: NO GROWTH

## 2021-02-20 LAB — T4, FREE: Free T4: 1.37 ng/dL — ABNORMAL HIGH (ref 0.61–1.12)

## 2021-02-20 LAB — TSH: TSH: 3.454 u[IU]/mL (ref 0.350–4.500)

## 2021-02-20 MED ORDER — FUROSEMIDE 10 MG/ML IJ SOLN
40.0000 mg | Freq: Once | INTRAMUSCULAR | Status: AC
  Administered 2021-02-20: 40 mg via INTRAVENOUS
  Filled 2021-02-20: qty 4

## 2021-02-20 MED ORDER — METOPROLOL TARTRATE 25 MG PO TABS
25.0000 mg | ORAL_TABLET | Freq: Two times a day (BID) | ORAL | Status: DC
  Administered 2021-02-20 – 2021-02-22 (×5): 25 mg via ORAL
  Filled 2021-02-20 (×5): qty 1

## 2021-02-20 NOTE — Plan of Care (Signed)
  Problem: Clinical Measurements: Goal: Respiratory complications will improve Outcome: Progressing   Problem: Coping: Goal: Level of anxiety will decrease Outcome: Progressing   Problem: Elimination: Goal: Will not experience complications related to urinary retention Outcome: Progressing   

## 2021-02-20 NOTE — Assessment & Plan Note (Addendum)
Patient was admitted to the cardiac ward and was placed on metoprolol for rate control atrial flutter. His heart rate has been in the 105 to 110 range, with improvement in his symptoms.   Hypotension limiting rate control and patient has failed DC cardioversion in the past.  Probably a more permissive tachycardia will be appropriate.   Plan to continue with metoprolol 25 mg po bid and amiodarone. Anticoagulation with apixaban.

## 2021-02-20 NOTE — Progress Notes (Signed)
Progress Note   Patient: Nicholas [name] DOB: 11/11/1933 DOA: 02/18/2021     1 DOS: the patient was seen and examined on 02/20/2021   Brief hospital course: Nicholas [name] was admitted to the hospital with the working diagnosis of acute decompensated heart failure.  88 yo male with the past medical history of HTN, CKD stage 3b, systolic heart failure and atrial flutter/ fibrillation who presented with dyspnea.  Recent hospitalization from 01/10 to 01/20 for COPD exacerbation related to influenza infection. He had rapid ventricular response and underwent cardioversion on 01/27/21. Prior to that in 01/06 he also had cardioversion for uncontrolled rapid ventricular response related to atrial fibrillation.  Patient was noted to have worsening dyspnea for 24 hrs with oxygen desaturation down to 70's, with decrease mobility due to dyspnea on exertion. Apparently as outpatient his diuretic regimen was as needed basis. On his initial physical examination he was in respiratory distress and he was place on Bipap (non invasive mechanical ventilation), his 02 saturation was 70's on supplemental 02, HR 120's, BP 114/99, and RR 23. Heart with S1 and S2 present irregularly irregular with no gallops or murmurs, longs with no rales or wheezing, abdomen soft and positive lower extremity edema.   Na 139, K 4,6 CL 103, bicarbonate 26 glucose 105 bun 37 and cr 2,0 Wbc 9,1  hgb 126. Hct 40,5 plt 329  Sars covid 19 negative Urine analysis with SG 1,022 with negative nitrites.   Chest radiograph with increased bilateral interstitial markings, positive cardiomegaly.   EKG 121 bpm, right axis deviation, with right bundle branch block, atrial tachycardia, flutter 1:1 block, with no significant ST segment or T wave changes.   Patient was placed on amiodarone infusion and diuresis with furosemide.   Assessment and Plan: * Atrial flutter with rapid ventricular response ([place])- (present on admission) Patient  continue with rapid rate in the 110s this pm. He has been placed on metoprolol 25 mg po bid.  He is feeling weak but no palpitations, dyspnea or chest pain.   Plan to continue rate control with amiodarone and metoprolol. Continue anticoagulation with apixaban.  Continue telemetry monitoring.   Acute combined systolic and diastolic congestive heart failure ([place])- (present on admission) Acute on chronic hypoxemic respiratory failure due to cardiogenic pulmonary edema.  Echocardiogram from 01/2021 with reduced LV EF 25 to 30% with global hypokinesis, reduced RV systolic function mildly, moderate pericardial effusion. No significant valvular disease.   Plan to continue heart rate control One dose of furosemide today. Likely not candidate to repeat DC cardioversion at this point.  Continue with metoprolol  Holding on RAAS inhibition due to risk of hypotension.   Chronic kidney disease, stage 3b ([place])- (present on admission) Renal function with serum cr at 2,15 with K at 4,5 and serum bicarbonate at 23.  Continue close follow up on renal function and electrolytes.  One dose of furosemide today.   COPD not affecting current episode of care [facility])- (present on admission) No clinical signs of acute exacerbation, plan is to continue with bronchodilator therapy and inhaled corticosteroids.  Patient on supplemental oxygen at home.   HTN (hypertension)- (present on admission) Patient with hypotension, systolic blood pressure 19/41.Marland Kitchen Continue rate control for atrial flutter with metoprolol and amiodarone.   Glaucoma- (present on admission) On Latanoprost  Hyperlipidemia- (present on admission) Continue with statin therapy   Prolonged QT interval -Hold Zofran -Cautiously continue other meds - Amio, Albuterol, Dulera -Repeat EKG in AM  DNR (do not resuscitate)- (  present on admission) Patient very weak and deconditioned, multiple medical problems and recurrent hospitalizations. Code status  is DNR.         Subjective: patient continue to be very weak and deconditioned, not feeling well in general, no chest pain, or dyspnea at rest. Positive oxygen desaturation with minimal efforts.   Physical Exam: Vitals:   02/20/21 0614 02/20/21 0717 02/20/21 0749 02/20/21 1112  BP:   113/71 97/72  Pulse:   (!) 106 (!) 103  Resp:   20 20  Temp:   97.9 F (36.6 C) 98.3 F (36.8 C)  TempSrc:   Oral Oral  SpO2:  95% 92% 94%  Weight: 78.4 kg     Height:       Neurology awake and alert, deconditioned and ill looking appearing ENT mild pallor Cardiovascular heart S1 and S2 present and regular, tachycardic, no gallops or rubs,  Moderate JVD No lower extremity edema Respiratory with no rales or wheezing, scattered rhonchi.  Abdomen is soft and not distended.   Data Reviewed:    Family Communication: I spoke with patient's son and grandson at the bedside, we talked in detail about patient's condition, plan of care and prognosis and all questions were addressed.   Disposition: Status is: Inpatient Remains inpatient appropriate because: atrial flutter management.      Planned Discharge Destination: Home   Author: Tawni Millers, MD 02/20/2021 3:10 PM  For on call review [email or web address].

## 2021-02-20 NOTE — Progress Notes (Signed)
Progress Note  Patient Name: Nicholas [name] Date of Encounter: 02/20/2021  Primary Cardiologist:   Shirlee More, MD   Subjective   He says he feels OK.  He has not yet been out of bed.  No pain.  Inpatient Medications    Scheduled Meds:  allopurinol  100 mg Oral Daily   amiodarone  200 mg Oral BID   apixaban  2.5 mg Oral BID   arformoterol  15 mcg Nebulization BID   And   budesonide (PULMICORT) nebulizer solution  0.25 mg Nebulization BID   latanoprost  1 drop Both Eyes QHS   metoprolol tartrate  12.5 mg Oral BID   pantoprazole  20 mg Oral Daily   polyethylene glycol  17 g Oral Daily   sodium chloride flush  3 mL Intravenous Q12H   Continuous Infusions:  PRN Meds: acetaminophen **OR** acetaminophen, albuterol, hydrALAZINE, LORazepam   Vital Signs    Vitals:   02/19/21 2349 02/20/21 0407 02/20/21 0614 02/20/21 0717  BP: 121/82 120/86    Pulse: (!) 109 (!) 105    Resp: 20 16    Temp: 98.7 F (37.1 C) 97.8 F (36.6 C)    TempSrc: Oral Oral    SpO2: 95% 93%  95%  Weight:   78.4 kg   Height:        Intake/Output Summary (Last 24 hours) at 02/20/2021 0738 Last data filed at 02/20/2021 0616 Gross per 24 hour  Intake 360 ml  Output 2055 ml  Net -1695 ml   Filed Weights   02/18/21 1958 02/19/21 1333 02/20/21 0614  Weight: 85 kg 80.8 kg 78.4 kg    Telemetry    Atrial fib with rapid rate - Personally Reviewed  ECG    NA - Personally Reviewed  Physical Exam   GEN: No acute distress.   Neck: No  JVD Cardiac: Irregular RR, no murmurs, rubs, or gallops.  Distant heart sounds Respiratory:    Decreased breath sounds clear.  GI: Soft, nontender, non-distended  MS: No  edema; No deformity. Neuro:  Nonfocal  Psych: Normal affect   Labs    Chemistry Recent Labs  Lab 02/18/21 2021 02/19/21 0750 02/20/21 0415  NA 139 137 136  K 4.6 4.3 4.5  CL 103 103 101  CO2 26 24 23   GLUCOSE 105* 140* 114*  BUN 37* 39* 46*  CREATININE 2.08* 1.96* 2.15*   CALCIUM 9.2 9.0 9.0  PROT 5.7* 5.5*  --   ALBUMIN 3.1* 2.9*  --   AST 30 28  --   ALT 38 35  --   ALKPHOS 131* 122  --   BILITOT 0.6 0.6  --   GFRNONAA 31* 33* 29*  ANIONGAP 10 10 12      Hematology Recent Labs  Lab 02/18/21 2021 02/19/21 0750 02/20/21 0415  WBC 9.1 6.2 14.7*  RBC 5.28 5.30 5.21  HGB 12.6* 12.5* 12.6*  HCT 40.5 39.9 39.6  MCV 76.7* 75.3* 76.0*  MCH 23.9* 23.6* 24.2*  MCHC 31.1 31.3 31.8  RDW 25.5* 25.2* 25.0*  PLT 329 310 413*    Cardiac EnzymesNo results for input(s): TROPONINI in the last 168 hours. No results for input(s): TROPIPOC in the last 168 hours.   BNP Recent Labs  Lab 02/18/21 2038  BNP 1,096.1*     DDimer No results for input(s): DDIMER in the last 168 hours.   Radiology    DG Chest Port 1 View  Result Date: 02/18/2021 CLINICAL DATA:  Shortness  of breath EXAM: PORTABLE CHEST 1 VIEW COMPARISON:  01/26/2021 FINDINGS: Transverse diameter of heart is increased. There is interval increase in interstitial markings in both lungs, more so in the upper lung fields. There is no focal consolidation. There is no pleural effusion or pneumothorax. There is previous right shoulder arthroplasty. IMPRESSION: Cardiomegaly. There is interval increase in interstitial markings in both lungs, more so in the upper lung fields suggesting interstitial pulmonary edema or interstitial pneumonia. Electronically Signed   By: Elmer Picker M.D.   On: 02/18/2021 20:36    Cardiac Studies   NA  Patient Profile     88 y.o. male with a hx of a flutter, HFmEF, CKD 3b, HTN, TIA IDA and gout who is being seen 02/19/2021 for the evaluation of recurrent a fib and CHF at the request of Dr. Lorin Mercy.  Assessment & Plan    ACUTE ON CHRONIC SYSTOLIC HF:   Net negative 1.8 liters.   He has had a total of 60 mg IV Lasix.   Weight is still up and on 5 liters O2.  I would go ahead and give 40 mg IV Lasix today and switch to PO likely in the AM.   BP will allow beta blocker  titration.   ATRIAL FIB FLUTTER:  The plan is rate control.  Added beta blocker yesterday.    HTN:  This is being managed in the context of treating his CHF   CKD IIIB:  Creat is up a little.  Continue to follow.    For questions or updates, please contact [place] Please consult [email or web address] for contact info under Cardiology/STEMI.   Signed, Minus Breeding, MD  02/20/2021, 7:38 AM

## 2021-02-20 NOTE — Progress Notes (Signed)
°   02/20/21 2339  Assess: MEWS Score  Temp 97.8 F (36.6 C)  BP 90/67  Pulse Rate (!) 109  Resp 20  SpO2 90 %  O2 Device HFNC  O2 Flow Rate (L/min) 5 L/min  Assess: MEWS Score  MEWS Temp 0  MEWS Systolic 1  MEWS Pulse 1  MEWS RR 0  MEWS LOC 0  MEWS Score 2  MEWS Score Color Yellow  Assess: if the MEWS score is Yellow or Red  Were vital signs taken at a resting state? No  Focused Assessment No change from prior assessment  Early Detection of Sepsis Score *See Row Information* Low  MEWS guidelines implemented *See Row Information* No, other (Comment) (when pt ambulates HR will go up and pt gets SOB on exertion)  Document  Patient Outcome Stabilized after interventions  Progress note created (see row info) Yes

## 2021-02-20 NOTE — Hospital Course (Addendum)
Nicholas [name] was admitted to the hospital with the working diagnosis of acute decompensated heart failure.  88 yo male with the past medical history of HTN, CKD stage 3b, systolic heart failure and atrial flutter/ fibrillation who presented with dyspnea.  Recent hospitalization from 01/10 to 01/20 for COPD exacerbation related to influenza infection. He had rapid ventricular response and underwent cardioversion on 01/27/21. Prior to that in 01/06 he also had cardioversion for uncontrolled rapid ventricular response related to atrial fibrillation.  Patient was noted to have worsening dyspnea for 24 hrs with oxygen desaturation down to 70's, with decrease mobility due to dyspnea on exertion. Apparently as outpatient his diuretic regimen was as needed basis. On his initial physical examination he was in respiratory distress and he was place on Bipap (non invasive mechanical ventilation), his 02 saturation was 70's on supplemental 02, HR 120's, BP 114/99, and RR 23. Heart with S1 and S2 present irregularly irregular with no gallops or murmurs, longs with no rales or wheezing, abdomen soft and positive lower extremity edema.   Na 139, K 4,6 CL 103, bicarbonate 26 glucose 105 bun 37 and cr 2,0 Wbc 9,1  hgb 126. Hct 40,5 plt 329  Sars covid 19 negative Urine analysis with SG 1,022 with negative nitrites.   Chest radiograph with increased bilateral interstitial markings, positive cardiomegaly.   EKG 121 bpm, right axis deviation, with right bundle branch block, atrial tachycardia, flutter 1:1 block, with no significant ST segment or T wave changes.   Patient was placed on amiodarone infusion and diuresis with furosemide.

## 2021-02-21 DIAGNOSIS — I4892 Unspecified atrial flutter: Secondary | ICD-10-CM

## 2021-02-21 DIAGNOSIS — I5041 Acute combined systolic (congestive) and diastolic (congestive) heart failure: Secondary | ICD-10-CM

## 2021-02-21 DIAGNOSIS — N1832 Chronic kidney disease, stage 3b: Secondary | ICD-10-CM

## 2021-02-21 DIAGNOSIS — Z66 Do not resuscitate: Secondary | ICD-10-CM

## 2021-02-21 DIAGNOSIS — J449 Chronic obstructive pulmonary disease, unspecified: Secondary | ICD-10-CM

## 2021-02-21 LAB — BASIC METABOLIC PANEL
Anion gap: 11 (ref 5–15)
BUN: 50 mg/dL — ABNORMAL HIGH (ref 8–23)
CO2: 25 mmol/L (ref 22–32)
Calcium: 9 mg/dL (ref 8.9–10.3)
Chloride: 100 mmol/L (ref 98–111)
Creatinine, Ser: 2.28 mg/dL — ABNORMAL HIGH (ref 0.61–1.24)
GFR, Estimated: 27 mL/min — ABNORMAL LOW (ref >60)
Glucose, Bld: 79 mg/dL (ref 70–99)
Potassium: 4.2 mmol/L (ref 3.5–5.1)
Sodium: 136 mmol/L (ref 135–145)

## 2021-02-21 LAB — MAGNESIUM: Magnesium: 2 mg/dL (ref 1.7–2.4)

## 2021-02-21 MED ORDER — DICLOFENAC SODIUM 1 % EX GEL
2.0000 g | Freq: Four times a day (QID) | CUTANEOUS | Status: DC
  Administered 2021-02-21 – 2021-02-22 (×3): 2 g via TOPICAL
  Filled 2021-02-21: qty 100

## 2021-02-21 NOTE — Progress Notes (Signed)
Progress Note   Patient: Nicholas [name] ZOX:[id number] DOB: 07/18/1933 DOA: 02/18/2021     2 DOS: the patient was seen and examined on 02/21/2021   Brief hospital course: Nicholas [name] was admitted to the hospital with the working diagnosis of acute decompensated heart failure.  88 yo male with the past medical history of HTN, CKD stage 3b, systolic heart failure and atrial flutter/ fibrillation who presented with dyspnea.  Recent hospitalization from 01/10 to 01/20 for COPD exacerbation related to influenza infection. He had rapid ventricular response and underwent cardioversion on 01/27/21. Prior to that in 01/06 he also had cardioversion for uncontrolled rapid ventricular response related to atrial fibrillation.  Patient was noted to have worsening dyspnea for 24 hrs with oxygen desaturation down to 70's, with decrease mobility due to dyspnea on exertion. Apparently as outpatient his diuretic regimen was as needed basis. On his initial physical examination he was in respiratory distress and he was place on Bipap (non invasive mechanical ventilation), his 02 saturation was 70's on supplemental 02, HR 120's, BP 114/99, and RR 23. Heart with S1 and S2 present irregularly irregular with no gallops or murmurs, longs with no rales or wheezing, abdomen soft and positive lower extremity edema.   Na 139, K 4,6 CL 103, bicarbonate 26 glucose 105 bun 37 and cr 2,0 Wbc 9,1  hgb 126. Hct 40,5 plt 329  Sars covid 19 negative Urine analysis with SG 1,022 with negative nitrites.   Chest radiograph with increased bilateral interstitial markings, positive cardiomegaly.   EKG 121 bpm, right axis deviation, with right bundle branch block, atrial tachycardia, flutter 1:1 block, with no significant ST segment or T wave changes.   Patient was placed on amiodarone infusion and diuresis with furosemide.   Assessment and Plan: * Atrial flutter with rapid ventricular response ([place])- (present on admission) Telemetry  personally reviewed, continue with atrial flutter, with rate in th 105 to 110 range. Patient with no dyspnea and has been ambulating with assistance in the hallway.   Plan to continue rate control with metoprolol and amiodarone. Hypotension limiting rate control and patient has failed DC cardioversion in the past.  Probably a more permissive tachycardia will be appropriate.   Acute combined systolic and diastolic congestive heart failure ([place])- (present on admission) Acute on chronic hypoxemic respiratory failure due to cardiogenic pulmonary edema.  Echocardiogram from 01/2021 with reduced LV EF 25 to 30% with global hypokinesis, reduced RV systolic function mildly, moderate pericardial effusion. No significant valvular disease.   Continue heart failure management with metoprolol.  Holding on RAAS inhibition due to risk of hypotension. Patient with improved volume, may benefit from daily diuretic therapy.    Consult nutrition, nor now unrestricted diet.   Chronic kidney disease, stage 3b ([place])- (present on admission) Serum cr today is 2,28 with K at 4,2 and serum bicarbonate at 25.   Urine output over last 24 hrs is 2,250 ml.   Plan to continue close follow up on renal function and electrolytes.   COPD not affecting current episode of care [facility])- (present on admission) No  acute exacerbation. Continue with bronchodilator therapy and inhaled corticosteroids.  Patient on supplemental oxygen at home.   HTN (hypertension)- (present on admission) Systolic blood pressure 811 to 107 mmHg Plan to continue with metoprolol and close blood pressure monitoring.    Glaucoma- (present on admission) On Latanoprost  Hyperlipidemia- (present on admission) On statin therapy   Iron deficiency anemia- (present on admission) hgn and hct stable, patient  not taking iron supplementation at this point.  Poor prognosis.   Prolonged QT interval -Hold Zofran -Cautiously continue other meds - Amio,  Albuterol, Dulera -Repeat EKG in AM  DNR (do not resuscitate)- (present on admission) Patient very weak and deconditioned, multiple medical problems and recurrent hospitalizations. Code status is DNR.   I spoke with his grandson, (care giver), acknowledges frequent re-hospitalizations. Progressive decline in his health and quality of life. Will consult transition of care team to consult hospice team, for possible home hospice at discharge.         Subjective: Patient continue to be very weak and deconditioned, he is not feeling well in general, no specifics   Physical Exam: Vitals:   02/21/21 0431 02/21/21 0719 02/21/21 0814 02/21/21 1149  BP: 107/69 106/76  102/72  Pulse: (!) 111 (!) 112  (!) 107  Resp: 14 17  (!) 25  Temp: 97.9 F (36.6 C) (!) 97.5 F (36.4 C)  97.9 F (36.6 C)  TempSrc: Oral Oral  Oral  SpO2: 92% 93% 91% 91%  Weight: 77.3 kg     Height:       Neurology awake and alert ENT with mild pallor Cardiovascular with heart S1 and S2 present and tachycardic, regular with no gallops, positive systolic murmur Respiratory with no rales or rhonchi, scattered rales at bases bilaterally  Abdomen not distended.   Data Reviewed:    Family Communication: I spoke with patient's grandson at the bedside, we talked in detail about patient's condition, plan of care and prognosis and all questions were addressed.   Disposition: Status is: Inpatient Remains inpatient appropriate because: heart failure and atrial flutter management.      Planned Discharge Destination: Home     Author: Tawni Millers, MD 02/21/2021 4:04 PM  For on call review [email or web address].

## 2021-02-21 NOTE — Progress Notes (Signed)
Progress Note  Patient Name: Nicholas [name] Date of Encounter: 02/21/2021  [facility] Cardiologist: Shirlee More, MD   Subjective   Remains dyspneic Net diuresis 3 L since admission, 1.3 L in last 24 hours weight down about 3.5 kg since admission. Weight is now lower than previously estimated dry weight of 176 pounds.  It appears that he has lost true weight. Slight worsening of renal function parameters with diuresis.   Normal hemoglobin with microcytic indices. Family considering options for palliative care approach.  Inpatient Medications    Scheduled Meds:  allopurinol  100 mg Oral Daily   amiodarone  200 mg Oral BID   apixaban  2.5 mg Oral BID   arformoterol  15 mcg Nebulization BID   And   budesonide (PULMICORT) nebulizer solution  0.25 mg Nebulization BID   latanoprost  1 drop Both Eyes QHS   metoprolol tartrate  25 mg Oral BID   pantoprazole  20 mg Oral Daily   polyethylene glycol  17 g Oral Daily   sodium chloride flush  3 mL Intravenous Q12H   Continuous Infusions:  PRN Meds: acetaminophen **OR** acetaminophen, albuterol, hydrALAZINE   Vital Signs    Vitals:   02/21/21 0431 02/21/21 0719 02/21/21 0814 02/21/21 1149  BP: 107/69 106/76  102/72  Pulse: (!) 111 (!) 112  (!) 107  Resp: 14 17  (!) 25  Temp: 97.9 F (36.6 C) (!) 97.5 F (36.4 C)  97.9 F (36.6 C)  TempSrc: Oral Oral  Oral  SpO2: 92% 93% 91% 91%  Weight: 77.3 kg     Height:        Intake/Output Summary (Last 24 hours) at 02/21/2021 1351 Last data filed at 02/21/2021 1312 Gross per 24 hour  Intake 400 ml  Output 1051 ml  Net -651 ml   Last 3 Weights 02/21/2021 02/20/2021 02/19/2021  Weight (lbs) 170 lb 6.7 oz 172 lb 13.5 oz 178 lb 2.1 oz  Weight (kg) 77.3 kg 78.4 kg 80.8 kg      Telemetry    Atrial flutter with variable AV block, ventricular rate around 100-110- Personally Reviewed  ECG    Atrial flutter with rapid ventricular response, RBBB plus LPFB- Personally  Reviewed  Physical Exam  Appears elderly, very frail GEN: No acute distress.   Neck: No JVD Cardiac: RRR, split second heart sound, no murmurs, rubs, or gallops.  Respiratory: Clear to auscultation bilaterally. GI: Soft, nontender, non-distended  MS: No edema; No deformity. Neuro:  Nonfocal  Psych: Normal affect   Labs    High Sensitivity Troponin:  No results for input(s): TROPONINIHS in the last 720 hours.   Chemistry Recent Labs  Lab 02/18/21 2021 02/19/21 0750 02/20/21 0415 02/21/21 0412  NA 139 137 136 136  K 4.6 4.3 4.5 4.2  CL 103 103 101 100  CO2 26 24 23 25   GLUCOSE 105* 140* 114* 79  BUN 37* 39* 46* 50*  CREATININE 2.08* 1.96* 2.15* 2.28*  CALCIUM 9.2 9.0 9.0 9.0  MG  --   --   --  2.0  PROT 5.7* 5.5*  --   --   ALBUMIN 3.1* 2.9*  --   --   AST 30 28  --   --   ALT 38 35  --   --   ALKPHOS 131* 122  --   --   BILITOT 0.6 0.6  --   --   GFRNONAA 31* 33* 29* 27*  ANIONGAP 10 10 12  11    Lipids No results for input(s): CHOL, TRIG, HDL, LABVLDL, LDLCALC, CHOLHDL in the last 168 hours.  Hematology Recent Labs  Lab 02/18/21 2021 02/19/21 0750 02/20/21 0415  WBC 9.1 6.2 14.7*  RBC 5.28 5.30 5.21  HGB 12.6* 12.5* 12.6*  HCT 40.5 39.9 39.6  MCV 76.7* 75.3* 76.0*  MCH 23.9* 23.6* 24.2*  MCHC 31.1 31.3 31.8  RDW 25.5* 25.2* 25.0*  PLT 329 310 413*   Thyroid  Recent Labs  Lab 02/20/21 0415  TSH 3.454  FREET4 1.37*    BNP Recent Labs  Lab 02/18/21 2038  BNP 1,096.1*    DDimer No results for input(s): DDIMER in the last 168 hours.   Radiology    No results found.  Cardiac Studies   Echocardiogram 01/20/2021    1. Left ventricular ejection fraction, by estimation, is 25 to 30%. The  left ventricle has severely decreased function. The left ventricle  demonstrates global hypokinesis. The left ventricular internal cavity size  was mildly dilated. Left ventricular  diastolic parameters are consistent with Grade I diastolic dysfunction   (impaired relaxation).   2. Right ventricular systolic function is mildly reduced. The right  ventricular size is normal. Tricuspid regurgitation signal is inadequate  for assessing PA pressure.   3. Left atrial size was moderately dilated.   4. The mitral valve is degenerative. Mild mitral valve regurgitation. No  evidence of mitral stenosis.   5. The aortic valve is tricuspid. Aortic valve regurgitation is mild.  Mild to moderate aortic valve stenosis. Aortic valve area, by VTI measures  1.68 cm. Aortic valve mean gradient measures 15.0 mmHg.   6. The inferior vena cava is normal in size with greater than 50%  respiratory variability, suggesting right atrial pressure of 3 mmHg.   7. Moderate pericardial effusion, primarily adjacent to the RA and RV  (small elsewhere). There is no evidence of cardiac tamponade.   Patient Profile     88 y.o. male with history of recurrent atrial fibrillation and atrial flutter, heart failure with reduced systolic function, mild-moderate nonrheumatic aortic stenosis, small-moderate pericardial effusion, CKD 3B, hypertension, history of TIA, iron deficiency anemia, history of gout, admitted with recurrent atrial fibrillation and heart failure exacerbation  Assessment & Plan    Has failed repeated attempts at rhythm control and plan is for long-term rate control.  Amiodarone being used due to borderline low blood pressure.  Beta-blocker added 2 days ago.  Blood pressure may allow a slight increase in beta-blocker dose, but may need to add digoxin (reluctant to do so due to worsening renal function and concomitant use of amiodarone). Poor ventricular rate control probably contributory to recent decline in LVEF from 55 to 60% in July to 40-45% in October to 25-30% in January. Relatively small pericardial effusion and mild-moderate aortic stenosis are not yet hemodynamically significant. Family requests exploration of options for palliative care.  In view of his  steady decline and very poor functional status, this is very reasonable     For questions or updates, please contact [place] Please consult [email or web address] for contact info under        Signed, Sanda Klein, MD  02/21/2021, 1:51 PM

## 2021-02-21 NOTE — Progress Notes (Signed)
°   02/21/21 0431  Assess: MEWS Score  Temp 97.9 F (36.6 C)  BP 107/69  Pulse Rate (!) 111  ECG Heart Rate (!) 111  Resp 14  SpO2 92 %  O2 Device HFNC  O2 Flow Rate (L/min) 5 L/min  Assess: MEWS Score  MEWS Temp 0  MEWS Systolic 0  MEWS Pulse 2  MEWS RR 0  MEWS LOC 0  MEWS Score 2  MEWS Score Color Yellow  Assess: if the MEWS score is Yellow or Red  Were vital signs taken at a resting state? No  Focused Assessment No change from prior assessment  Early Detection of Sepsis Score *See Row Information* Low  MEWS guidelines implemented *See Row Information* No, previously yellow, continue vital signs every 4 hours  Document  Patient Outcome Stabilized after interventions  Progress note created (see row info) Yes

## 2021-02-21 NOTE — Progress Notes (Signed)
° °  Mr. Dagmawi Venable is a current pt under [facility]. This is a home based palliative care program provided by [facility]. He is currently getting nursing and SW visits with this program.   We have received call from Transitions of care team Tomi Bamberger requesting that we meet with family and discuss hospice services. I will make arrangements to meet with them tomorrow to assist in this discussion and transition to a comfort based approach.   Webb Silversmith RN [phone]

## 2021-02-21 NOTE — Assessment & Plan Note (Addendum)
hgb has been stable, at the time of his discharge is 12,6 with hct 39,6. Plan to continue monitoring as outpatient, patient with poor prognosis, not taking iron supplementation at his point.

## 2021-02-21 NOTE — Progress Notes (Signed)
Mobility Specialist Progress Note:   02/21/21 1000  Mobility  Activity Ambulated with assistance in hallway  Level of Assistance Contact guard assist, steadying assist  Assistive Device Front wheel walker  Distance Ambulated (ft) 240 ft  Activity Response Tolerated well  Transport method Ambulatory   Pt agreed to OOB mobility this am. Received on 5LO2, session performed on 6LO2 d/t limited tank settings. SpO2 ranged 90-98% throughout session, pt tolerated well. Left back in bed with son present.   Nelta Numbers Mobility Specialist  Phone: [phone]

## 2021-02-21 NOTE — TOC Progression Note (Addendum)
Transition of Care [facility]) - Progression Note    Patient Details  Name: Nicholas [name] MRN: [id number] Date of Birth: 1933-08-23  [facility] CM/SW Contact  Zenon Mayo, RN Phone Number: 02/21/2021, 10:04 AM  Clinical Narrative:    Patient from home, aflutter, acute sys/diastolioc chf, ckd, copd, HLD, BP soft, on HFNC 5 liters.  TOC will continue to follow for dc needs. Consult in for home hospice, NCM spoke with POA , [name] and the [employer], they state the doctor said Hospice will be speaking with them regarding hospice, they are already with [facility] for [facility] palliative services.   NCM informed them they may contact them tomorrow, then this NCM will follow up with recommendations.  Patient has  home oxygen with [employer] 2 liters, walker and shower chair.  [name] states they are talking about getting a humidifier on the oxygen because it dries him out so much. NCM spoke with Cheri with [facility], she will contact [place] tomorrow to discuss home hospice.          Expected Discharge Plan and Services                                                 Social Determinants of Health (SDOH) Interventions    Readmission Risk Interventions Readmission Risk Prevention Plan 01/21/2021  Transportation Screening Complete  PCP or Specialist Appt within 3-5 Days Not Complete  HRI or [place] Complete  Social Work Consult for [place] Planning/Counseling Complete  Palliative Care Screening Complete  Medication Review [employer]) Referral to Pharmacy  Some recent data might be hidden

## 2021-02-22 ENCOUNTER — Other Ambulatory Visit: Payer: PPO

## 2021-02-22 ENCOUNTER — Other Ambulatory Visit (HOSPITAL_COMMUNITY): Payer: Self-pay

## 2021-02-22 ENCOUNTER — Ambulatory Visit: Payer: Self-pay | Admitting: Oncology

## 2021-02-22 DIAGNOSIS — I159 Secondary hypertension, unspecified: Secondary | ICD-10-CM

## 2021-02-22 DIAGNOSIS — N179 Acute kidney failure, unspecified: Secondary | ICD-10-CM

## 2021-02-22 DIAGNOSIS — D509 Iron deficiency anemia, unspecified: Secondary | ICD-10-CM

## 2021-02-22 LAB — BASIC METABOLIC PANEL
Anion gap: 11 (ref 5–15)
BUN: 46 mg/dL — ABNORMAL HIGH (ref 8–23)
CO2: 24 mmol/L (ref 22–32)
Calcium: 8.6 mg/dL — ABNORMAL LOW (ref 8.9–10.3)
Chloride: 100 mmol/L (ref 98–111)
Creatinine, Ser: 2.36 mg/dL — ABNORMAL HIGH (ref 0.61–1.24)
GFR, Estimated: 26 mL/min — ABNORMAL LOW (ref >60)
Glucose, Bld: 107 mg/dL — ABNORMAL HIGH (ref 70–99)
Potassium: 3.9 mmol/L (ref 3.5–5.1)
Sodium: 135 mmol/L (ref 135–145)

## 2021-02-22 LAB — MAGNESIUM: Magnesium: 2 mg/dL (ref 1.7–2.4)

## 2021-02-22 MED ORDER — METOPROLOL TARTRATE 25 MG PO TABS
25.0000 mg | ORAL_TABLET | Freq: Two times a day (BID) | ORAL | 0 refills | Status: AC
  Filled 2021-02-22: qty 60, 30d supply, fill #0

## 2021-02-22 MED ORDER — DICLOFENAC SODIUM 1 % EX GEL
2.0000 g | Freq: Four times a day (QID) | CUTANEOUS | 0 refills | Status: DC
  Filled 2021-02-22: qty 100, 8d supply, fill #0

## 2021-02-22 MED ORDER — ORAL CARE MOUTH RINSE
15.0000 mL | Freq: Two times a day (BID) | OROMUCOSAL | Status: DC
  Administered 2021-02-22: 15 mL via OROMUCOSAL

## 2021-02-22 MED ORDER — FUROSEMIDE 40 MG PO TABS
40.0000 mg | ORAL_TABLET | Freq: Every day | ORAL | 0 refills | Status: AC
  Filled 2021-02-22: qty 30, 30d supply, fill #0

## 2021-02-22 MED ORDER — FUROSEMIDE 40 MG PO TABS: 40.0000 mg | ORAL_TABLET | Freq: Every day | ORAL | Status: DC

## 2021-02-22 NOTE — Discharge Summary (Signed)
Physician Discharge Summary   Patient: Nicholas [name] MRN: [id number] DOB: November 03, 1933  Admit date:     02/18/2021  Discharge date: 02/22/21  Discharge Physician: Tawni Millers   PCP: [name], [name], MD   Recommendations at discharge:    Patient has been placed on metoprolol 25 mg po bid with good toleration Changed furosemide to take daily 40 mg and increase dose to 40 mg bid in case of weight gain 3 lbs in 48 hrs or 5 lbs in 7 days.  Continue supplemental 02 at home, follow up with palliative care services and possible transition to hospice as outpatient.   Discharge Diagnoses: Principal Problem:   Atrial flutter with rapid ventricular response (HCC) Active Problems:   Acute combined systolic and diastolic congestive heart failure (HCC)   COPD not affecting current episode of care [facility])   Acute renal failure superimposed on stage 3b chronic kidney disease (HCC)   HTN (hypertension)   Hyperlipidemia   Glaucoma   Iron deficiency anemia   DNR (do not resuscitate)  Resolved Problems:   * No resolved hospital problems. [facility] Course: Nicholas [name] was admitted to the hospital with the working diagnosis of acute decompensated heart failure.  88 yo male with the past medical history of HTN, CKD stage 3b, systolic heart failure and atrial flutter/ fibrillation who presented with dyspnea.  Recent hospitalization from 01/10 to 01/20 for COPD exacerbation related to influenza infection. He had rapid ventricular response and underwent cardioversion on 01/27/21. Prior to that in 01/06 he also had cardioversion for uncontrolled rapid ventricular response related to atrial fibrillation.  Patient was noted to have worsening dyspnea for 24 hrs with oxygen desaturation down to 70's, with decrease mobility due to dyspnea on exertion. Apparently as outpatient his diuretic regimen was as needed basis. On his initial physical examination he was in respiratory distress and he was  place on Bipap (non invasive mechanical ventilation), his 02 saturation was 70's on supplemental 02, HR 120's, BP 114/99, and RR 23. Heart with S1 and S2 present irregularly irregular with no gallops or murmurs, longs with no rales or wheezing, abdomen soft and positive lower extremity edema.   Na 139, K 4,6 CL 103, bicarbonate 26 glucose 105 bun 37 and cr 2,0 Wbc 9,1  hgb 126. Hct 40,5 plt 329  Sars covid 19 negative Urine analysis with SG 1,022 with negative nitrites.   Chest radiograph with increased bilateral interstitial markings, positive cardiomegaly.   EKG 121 bpm, right axis deviation, with right bundle branch block, atrial tachycardia, flutter 1:1 block, with no significant ST segment or T wave changes.   Patient was placed on amiodarone infusion and diuresis with furosemide.   Assessment and Plan: * Atrial flutter with rapid ventricular response ([place])- (present on admission) Patient was admitted to the cardiac ward and was placed on metoprolol for rate control atrial flutter. His heart rate has been in the 105 to 110 range, with improvement in his symptoms.   Hypotension limiting rate control and patient has failed DC cardioversion in the past.  Probably a more permissive tachycardia will be appropriate.   Plan to continue with metoprolol 25 mg po bid and amiodarone. Anticoagulation with apixaban.   Acute combined systolic and diastolic congestive heart failure ([place])- (present on admission) Acute on chronic hypoxemic respiratory failure due to cardiogenic pulmonary edema.   Patient was placed on furosemide for diuresis with improvement in his volume status.   Echocardiogram from 01/2021 with reduced  LV EF 25 to 30% with global hypokinesis, reduced RV systolic function mildly, moderate pericardial effusion. No significant valvular disease.   Plan to continue with metoprolol for heart failure management Continue holding on RAAS inhibition due to risk of  hypotension.  Patient very weak and deconditioned, rapid decline in his health, follow up with palliative care as outpatient and possible transition to hospice as outpatient.   Acute renal failure superimposed on stage 3b chronic kidney disease ([place])- (present on admission) Patient received diuresis, with improvement in his volume status. At the time of discharge his serum cr is 2,36 with K at 3,9 and serum bicarbonate at 24.  Plan to continue diuresis with furosemide 40 mg daily and instructions to increase to bid in case of edema or worsening dyspnea, weight gain 3 lbs in 48 hrs or 5 lbs in 7 days.   COPD not affecting current episode of care [facility])- (present on admission) No clinical acute exacerbation. Bronchodilator therapy and inhaled corticosteroids.  Supplemental oxygen at home, will add humidity to his oxygen delivery system.   HTN (hypertension)- (present on admission) Patient has been tolerating well metoprolol with systolic blood pressure in the 100 range.    Glaucoma- (present on admission) Continue with Latanoprost  Hyperlipidemia- (present on admission) Continue with statin at home   Iron deficiency anemia- (present on admission) hgb has been stable, at the time of his discharge is 12,6 with hct 39,6. Plan to continue monitoring as outpatient, patient with poor prognosis, not taking iron supplementation at his point.   Prolonged QT interval -Hold Zofran -Cautiously continue other meds - Amio, Albuterol, Dulera -Repeat EKG in AM  DNR (do not resuscitate)- (present on admission) Patient very weak and deconditioned, multiple medical problems and recurrent hospitalizations. Code status is DNR.   I spoke with his grandson, (care giver), acknowledges frequent re-hospitalizations. Progressive decline in his health and quality of life.  Patient will continue under palliative care and possible transition to hospice as outpatient.            Consultants:  cardiology  Procedures performed: none    Disposition: Home Diet recommendation:  Regular diet  DISCHARGE MEDICATION: Allergies as of 02/22/2021       Reactions   Penicillins Hives   Childhood allergy Has patient had a PCN reaction causing immediate rash, facial/tongue/throat swelling, SOB or lightheadedness with hypotension: Yes Has patient had a PCN reaction causing severe rash involving mucus membranes or skin necrosis: Yes Has patient had a PCN reaction that required hospitalization: Yes Has patient had a PCN reaction occurring within the last 10 years: No If all of the above answers are "NO", then may proceed with Cephalosporin use.        Medication List     TAKE these medications    acetaminophen 500 MG tablet Commonly known as: TYLENOL Take 500-1,000 mg by mouth every 8 (eight) hours as needed for mild pain or headache.   albuterol 108 (90 Base) MCG/ACT inhaler Commonly known as: VENTOLIN HFA Inhale 2 puffs into the lungs every 4 (four) hours as needed for wheezing or shortness of breath.   albuterol (2.5 MG/3ML) 0.083% nebulizer solution Commonly known as: PROVENTIL Take 2.5 mg by nebulization every 4 (four) hours as needed for wheezing or shortness of breath.   allopurinol 100 MG tablet Commonly known as: ZYLOPRIM Take 100 mg by mouth daily.   amiodarone 200 MG tablet Commonly known as: PACERONE Take 1 tablet (200 mg total) by mouth 2 (two) times  daily.   apixaban 2.5 MG Tabs tablet Commonly known as: ELIQUIS Take 1 tablet (2.5 mg total) by mouth 2 (two) times daily.   CALCIUM 600+D3 PO Take 2 tablets by mouth daily.   diclofenac Sodium 1 % Gel Commonly known as: VOLTAREN Apply 2 g topically 4 (four) times daily.   Ensure Take 237 mLs by mouth See admin instructions. Drink 237 ml's by mouth one to two times a day   Fish Oil 1000 MG Caps Take 1,000 mg by mouth daily.   Fluticasone-Salmeterol 250-50 MCG/DOSE Aepb Commonly known as: ADVAIR Inhale  1 puff into the lungs 2 (two) times daily.   furosemide 40 MG tablet Commonly known as: LASIX Take 1 tablet (40 mg total) by mouth daily. What changed: when to take this   latanoprost 0.005 % ophthalmic solution Commonly known as: XALATAN Place 1 drop into both eyes at bedtime.   metoprolol tartrate 25 MG tablet Commonly known as: LOPRESSOR Take 1 tablet (25 mg total) by mouth 2 (two) times daily.   multivitamin with minerals Tabs tablet Take 1 tablet by mouth every morning.   OXYGEN Inhale 2 L/min into the lungs continuous.   pantoprazole 20 MG tablet Commonly known as: Protonix Take 1 tablet (20 mg total) by mouth daily. What changed: when to take this   polyethylene glycol 17 g packet Commonly known as: MIRALAX / GLYCOLAX Take 17 g by mouth daily. What changed:  when to take this reasons to take this         Discharge Exam: Filed Weights   02/20/21 0614 02/21/21 0431 02/22/21 0352  Weight: 78.4 kg 77.3 kg 75.4 kg   BP 107/71 (BP Location: Right Arm)    Pulse (!) 104    Temp (!) 97.5 F (36.4 C) (Oral)    Resp (!) 22    Ht 5\' 9"  (1.753 m)    Wt 75.4 kg    SpO2 92%    BMI 24.55 kg/m   Neurology awake and alert ENT with mild pallor Cardiovascular with S1 and S2 present, irregular and tachycardic, with no gallops, positive murmur at the right sternal border. Mild JVD No lower extremity edema Respiratory with no wheezing or rhonchi, no rales Abdomen is soft and non tender  Condition at discharge: stable  The results of significant diagnostics from this hospitalization (including imaging, microbiology, ancillary and laboratory) are listed below for reference.   Imaging Studies: DG Chest 2 View  Result Date: 01/26/2021 CLINICAL DATA:  Dyspnea EXAM: CHEST - 2 VIEW COMPARISON:  Chest radiograph 01/24/2021 FINDINGS: The heart is enlarged, unchanged. The upper mediastinal contours are stable. There are trace bilateral pleural effusions with mild atelectasis in  the right base, not significantly changed since the most recent prior study but slightly improved since 01/18/2021. There is no new or worsening focal airspace disease. There is no pulmonary edema. There is no pneumothorax. The bones are stable, including partially imaged right shoulder arthroplasty hardware with slight superior migration of the humeral head component. IMPRESSION: 1. Trace bilateral pleural effusions with mild atelectasis in the right base, not significantly changed since the most recent prior study but slightly improved since 01/18/2021. 2. Unchanged cardiomegaly. Electronically Signed   By: Valetta Mole M.D.   On: 01/26/2021 10:52   DG Chest Port 1 View  Result Date: 02/18/2021 CLINICAL DATA:  Shortness of breath EXAM: PORTABLE CHEST 1 VIEW COMPARISON:  01/26/2021 FINDINGS: Transverse diameter of heart is increased. There is interval increase in interstitial markings  in both lungs, more so in the upper lung fields. There is no focal consolidation. There is no pleural effusion or pneumothorax. There is previous right shoulder arthroplasty. IMPRESSION: Cardiomegaly. There is interval increase in interstitial markings in both lungs, more so in the upper lung fields suggesting interstitial pulmonary edema or interstitial pneumonia. Electronically Signed   By: Elmer Picker M.D.   On: 02/18/2021 20:36   DG CHEST PORT 1 VIEW  Result Date: 01/24/2021 CLINICAL DATA:  Follow-up exam.  Shortness of breath. EXAM: PORTABLE CHEST 1 VIEW COMPARISON:  01/18/2021 FINDINGS: Heart is markedly enlarged, similar to the prior study. There is probable small RIGHT-sided pleural effusion and subsegmental atelectasis in the RIGHT LOWER lobe. Otherwise lungs are clear. No pulmonary edema. Note is made of atherosclerosis of the thoracic aorta. Prior RIGHT shoulder arthroplasty. There is superior subluxation of the humeral head component. Appearance is similar to prior. IMPRESSION: Stable cardiomegaly. Similar  RIGHT LOWER lobe atelectasis and probable small effusion. Electronically Signed   By: Nolon Nations M.D.   On: 01/24/2021 13:52    Microbiology: Results for orders placed or performed during the hospital encounter of 02/18/21  Blood Culture (routine x 2)     Status: None (Preliminary result)   Collection Time: 02/18/21  8:27 PM   Specimen: BLOOD  Result Value Ref Range Status   Specimen Description BLOOD LEFT ANTECUBITAL  Final   Special Requests   Final    BOTTLES DRAWN AEROBIC AND ANAEROBIC Blood Culture adequate volume   Culture   Final    NO GROWTH 3 DAYS Performed at [facility], [place] [address], [place], [place] [address]    Report Status PENDING  Incomplete  Blood Culture (routine x 2)     Status: None (Preliminary result)   Collection Time: 02/18/21 10:09 PM   Specimen: BLOOD  Result Value Ref Range Status   Specimen Description   Final    BLOOD Performed at [facility], [address] [place], [place] [address]    Special Requests   Final    NONE Performed at [facility], [address], [place], [place] [address]    Culture   Final    NO GROWTH 3 DAYS Performed at [facility], [place] [address], [place], [place] [address]    Report Status PENDING  Incomplete  Urine Culture     Status: None   Collection Time: 02/18/21 10:18 PM   Specimen: In/Out Cath Urine  Result Value Ref Range Status   Specimen Description   Final    IN/OUT CATH URINE Performed at [facility], [address], [place], [place] [address]    Special Requests   Final    NONE Performed at [facility], [address] [place], [place] [address]    Culture   Final    NO GROWTH Performed at [facility], [place] [address], [place],  [address]    Report Status 02/20/2021 FINAL  Final  Resp Panel by RT-PCR (Flu A&B, Covid) Nasopharyngeal Swab     Status: None   Collection Time: 02/19/21  12:21 AM   Specimen: Nasopharyngeal Swab; Nasopharyngeal(NP) swabs in vial transport medium  Result Value Ref Range Status   SARS Coronavirus 2 by RT PCR NEGATIVE NEGATIVE Final    Comment: (NOTE) SARS-CoV-2 target nucleic acids are NOT DETECTED.  The SARS-CoV-2 RNA is generally detectable in upper respiratory specimens during the acute phase of infection. The lowest concentration  of SARS-CoV-2 viral copies this assay can detect is 138 copies/mL. A negative result does not preclude SARS-Cov-2 infection and should not be used as the sole basis for treatment or other patient management decisions. A negative result may occur with  improper specimen collection/handling, submission of specimen other than nasopharyngeal swab, presence of viral mutation(s) within the areas targeted by this assay, and inadequate number of viral copies(<138 copies/mL). A negative result must be combined with clinical observations, patient history, and epidemiological information. The expected result is Negative.  Fact Sheet for Patients:  EntrepreneurPulse.com.au  Fact Sheet for Healthcare Providers:  IncredibleEmployment.be  This test is no t yet approved or cleared by the [place] FDA and  has been authorized for detection and/or diagnosis of SARS-CoV-2 by FDA under an Emergency Use Authorization (EUA). This EUA will remain  in effect (meaning this test can be used) for the duration of the COVID-19 declaration under Section 564(b)(1) of the Act, 21 U.S.C.section 360bbb-3(b)(1), unless the authorization is terminated  or revoked sooner.       Influenza A by PCR NEGATIVE NEGATIVE Final   Influenza B by PCR NEGATIVE NEGATIVE Final    Comment: (NOTE) The Xpert Xpress SARS-CoV-2/FLU/RSV plus assay is intended as an aid in the diagnosis of influenza from Nasopharyngeal swab specimens and should not be used as a sole basis for treatment. Nasal washings and aspirates  are unacceptable for Xpert Xpress SARS-CoV-2/FLU/RSV testing.  Fact Sheet for Patients: EntrepreneurPulse.com.au  Fact Sheet for Healthcare Providers: IncredibleEmployment.be  This test is not yet approved or cleared by the [place] FDA and has been authorized for detection and/or diagnosis of SARS-CoV-2 by FDA under an Emergency Use Authorization (EUA). This EUA will remain in effect (meaning this test can be used) for the duration of the COVID-19 declaration under Section 564(b)(1) of the Act, 21 U.S.C. section 360bbb-3(b)(1), unless the authorization is terminated or revoked.  Performed at [facility], [address], [place], [place] [address]     Labs: CBC: Recent Labs  Lab 02/18/21 2021 02/19/21 0750 02/20/21 0415  WBC 9.1 6.2 14.7*  NEUTROABS 6.1 4.0  --   HGB 12.6* 12.5* 12.6*  HCT 40.5 39.9 39.6  MCV 76.7* 75.3* 76.0*  PLT 329 310 686*   Basic Metabolic Panel: Recent Labs  Lab 02/18/21 2021 02/19/21 0750 02/20/21 0415 02/21/21 0412 02/22/21 0230  NA 139 137 136 136 135  K 4.6 4.3 4.5 4.2 3.9  CL 103 103 101 100 100  CO2 26 24 23 25 24   GLUCOSE 105* 140* 114* 79 107*  BUN 37* 39* 46* 50* 46*  CREATININE 2.08* 1.96* 2.15* 2.28* 2.36*  CALCIUM 9.2 9.0 9.0 9.0 8.6*  MG  --   --   --  2.0 2.0   Liver Function Tests: Recent Labs  Lab 02/18/21 2021 02/19/21 0750  AST 30 28  ALT 38 35  ALKPHOS 131* 122  BILITOT 0.6 0.6  PROT 5.7* 5.5*  ALBUMIN 3.1* 2.9*   CBG: No results for input(s): GLUCAP in the last 168 hours.  Discharge time spent: greater than 30 minutes.  Signed: Tawni Millers, MD [facility] 02/22/2021

## 2021-02-22 NOTE — Progress Notes (Addendum)
Progress Note  Patient Name: Nicholas [name] Date of Encounter: 02/22/2021  [facility] Cardiologist: Shirlee More, MD   Subjective   Patient denies chest pain, palpitations. Has occasional SOB that is relieved with breathing treatments.   Inpatient Medications    Scheduled Meds:  allopurinol  100 mg Oral Daily   amiodarone  200 mg Oral BID   apixaban  2.5 mg Oral BID   arformoterol  15 mcg Nebulization BID   And   budesonide (PULMICORT) nebulizer solution  0.25 mg Nebulization BID   diclofenac Sodium  2 g Topical QID   latanoprost  1 drop Both Eyes QHS   mouth rinse  15 mL Mouth Rinse BID   metoprolol tartrate  25 mg Oral BID   pantoprazole  20 mg Oral Daily   polyethylene glycol  17 g Oral Daily   sodium chloride flush  3 mL Intravenous Q12H   Continuous Infusions:  PRN Meds: acetaminophen **OR** acetaminophen, albuterol, hydrALAZINE   Vital Signs    Vitals:   02/21/21 1925 02/21/21 1944 02/21/21 1945 02/22/21 0352  BP: 107/75   107/71  Pulse: (!) 106   (!) 104  Resp: 18   (!) 22  Temp: 97.9 F (36.6 C)   (!) 97.5 F (36.4 C)  TempSrc: Oral   Oral  SpO2: 96% 95% 94% 92%  Weight:    75.4 kg  Height:        Intake/Output Summary (Last 24 hours) at 02/22/2021 0907 Last data filed at 02/22/2021 0755 Gross per 24 hour  Intake 420 ml  Output 476 ml  Net -56 ml   Last 3 Weights 02/22/2021 02/21/2021 02/20/2021  Weight (lbs) 166 lb 3.6 oz 170 lb 6.7 oz 172 lb 13.5 oz  Weight (kg) 75.4 kg 77.3 kg 78.4 kg      Telemetry    Atrial flutter, heart rate 100s - Personally Reviewed  ECG    No new tracings - Personally Reviewed  Physical Exam   GEN: No acute distress. Wearing a nasal cannula with 5L O2  Neck: No JVD Cardiac: Irregular rate and rhythm, no murmurs, rubs, or gallops.  Respiratory: Anterior lung exam clear to auscultation, no increased work of breathing. GI: Soft, nontender, non-distended  MS: No edema. Palpable cord on dorsal right calf. No  tenderness, swelling, warmth, or redness on bilateral lower extremities.  Neuro:  Nonfocal  Psych: Normal affect   Labs    High Sensitivity Troponin:  No results for input(s): TROPONINIHS in the last 720 hours.   Chemistry Recent Labs  Lab 02/18/21 2021 02/19/21 0750 02/20/21 0415 02/21/21 0412 02/22/21 0230  NA 139 137 136 136 135  K 4.6 4.3 4.5 4.2 3.9  CL 103 103 101 100 100  CO2 26 24 23 25 24   GLUCOSE 105* 140* 114* 79 107*  BUN 37* 39* 46* 50* 46*  CREATININE 2.08* 1.96* 2.15* 2.28* 2.36*  CALCIUM 9.2 9.0 9.0 9.0 8.6*  MG  --   --   --  2.0 2.0  PROT 5.7* 5.5*  --   --   --   ALBUMIN 3.1* 2.9*  --   --   --   AST 30 28  --   --   --   ALT 38 35  --   --   --   ALKPHOS 131* 122  --   --   --   BILITOT 0.6 0.6  --   --   --  GFRNONAA 31* 33* 29* 27* 26*  ANIONGAP 10 10 12 11 11     Lipids No results for input(s): CHOL, TRIG, HDL, LABVLDL, LDLCALC, CHOLHDL in the last 168 hours.  Hematology Recent Labs  Lab 02/18/21 2021 02/19/21 0750 02/20/21 0415  WBC 9.1 6.2 14.7*  RBC 5.28 5.30 5.21  HGB 12.6* 12.5* 12.6*  HCT 40.5 39.9 39.6  MCV 76.7* 75.3* 76.0*  MCH 23.9* 23.6* 24.2*  MCHC 31.1 31.3 31.8  RDW 25.5* 25.2* 25.0*  PLT 329 310 413*   Thyroid  Recent Labs  Lab 02/20/21 0415  TSH 3.454  FREET4 1.37*    BNP Recent Labs  Lab 02/18/21 2038  BNP 1,096.1*    DDimer No results for input(s): DDIMER in the last 168 hours.   Radiology    No results found.  Cardiac Studies   Echocardiogram 01/20/2021   1. Left ventricular ejection fraction, by estimation, is 25 to 30%. The  left ventricle has severely decreased function. The left ventricle  demonstrates global hypokinesis. The left ventricular internal cavity size  was mildly dilated. Left ventricular  diastolic parameters are consistent with Grade I diastolic dysfunction  (impaired relaxation).   2. Right ventricular systolic function is mildly reduced. The right  ventricular size is normal.  Tricuspid regurgitation signal is inadequate  for assessing PA pressure.   3. Left atrial size was moderately dilated.   4. The mitral valve is degenerative. Mild mitral valve regurgitation. No  evidence of mitral stenosis.   5. The aortic valve is tricuspid. Aortic valve regurgitation is mild.  Mild to moderate aortic valve stenosis. Aortic valve area, by VTI measures  1.68 cm. Aortic valve mean gradient measures 15.0 mmHg.   6. The inferior vena cava is normal in size with greater than 50%  respiratory variability, suggesting right atrial pressure of 3 mmHg.   7. Moderate pericardial effusion, primarily adjacent to the RA and RV  (small elsewhere). There is no evidence of cardiac tamponade.   Patient Profile     88 y.o. male with history of recurrent atrial fibrillation and atrial flutter, heart failure with reduced systolic function, mild-moderate nonrheumatic aortic stenosis, small-moderate pericardial effusion, CKD 3B, hypertension, history of TIA, iron deficiency anemia, history of gout, admitted with recurrent atrial fibrillation and heart failure exacerbation  Assessment & Plan    Atrial Flutter  - Historically, patient has failed repeated attempts at rhythm control. Will pursue long-term rate control  - Currently on Amiodarone 200 mg BID, lopressor 25 mg BID  - On eliquis 2.23m BID (dosed for age, renal function)  - Patient reports feeling a fluttering in his chest when his heart rate is above 110s. Asymptomatic with his heart rate below 110. Per telemetry, heart rate ranges from 98-107. With his soft BP, worsening renal function, worsening overall health, this might be the best control we can achieve.   Acute on Chronic combined systolic and diastolic CHF  - Echo this admission showed LVEF 25-30% (down from 40-45% in 10/2020) and grade I diastolic dysfunction  - Decline in EF likely related to poorly controlled HR in afib  - On metoprolol  - Has received 3 doses of IV lasix  (one dose at 256m others at 40 mg). Last dose given 2/12.   - Currently net -3.5 L since admission, weight down to 166.23 lbs this morning (was 187.4 lbs on admission) Estimated dry weight 175 lbs, likely patient has lost true weight  - Avoid ACEi/ARB/ARNI due to soft BP, poor  kidney function  - Patient euvolemic on exam.  - Due to patient's declining health and quality of life, hospice was consulted after discussion with family. Hospice plans to meet with family this afternoon  Stage IIIb CKD  - Creatinine 2.36 this AM, BUN 46, eGFR 26   - Continue to avoid nephrotoxic medications     Otherwise managed per primary  - Iron deficiency anemia  - Glaucoma - COPD not affecting current episode of care - Abnormal physical exam findings- palpable cord on right calf, discussed with primary team MD   For questions or updates, please contact [facility] Please consult [email or web address] for contact info under        Signed, Margie Billet, PA-C  02/22/2021, 9:07 AM    I have seen and examined the patient along with Margie Billet, PA-C .  I have reviewed the chart, notes and new data.  I agree with PA/NP's note.  Key new complaints: feeling a little better. Complaints of dyspnea and palpitations w light activity Key examination changes: elderly, frail, irregular rhythm, clear lungs Key new findings / data: off monitor. Renal parameters not really changed  PLAN: Reassess plan of care after mtg w hospice later today  Sanda Klein, MD, [place] [phone] 02/22/2021, 10:28 AM

## 2021-02-22 NOTE — Care Management Important Message (Signed)
Important Message  Patient Details  Name: Nicholas [name] MRN: [id number] Date of Birth: August 12, 1933   Medicare Important Message Given:  Yes     [name] 02/22/2021, 7:56 AM

## 2021-02-22 NOTE — Plan of Care (Signed)
°  Problem: Clinical Measurements: Goal: Diagnostic test results will improve Outcome: Progressing Goal: Respiratory complications will improve Outcome: Progressing   Problem: Elimination: Goal: Will not experience complications related to urinary retention Outcome: Progressing   Problem: Safety: Goal: Ability to remain free from injury will improve Outcome: Progressing

## 2021-02-22 NOTE — TOC Initial Note (Signed)
Transition of Care [facility]) - Initial/Assessment Note    Patient Details  Name: Nicholas [name] MRN: [id number] Date of Birth: [birth year]  Transition of Care [facility]) CM/SW Contact:    Zenon Mayo, RN Phone Number: 02/22/2021, 11:43 AM  Clinical Narrative:                 Patient from home, aflutter, acute sys/diastolioc chf, ckd, copd, HLD, BP soft, on HFNC 5 liters.  TOC will continue to follow for dc needs. Consult in for home hospice, NCM spoke with POA , [name] and the [employer], they state the doctor said Hospice will be speaking with them regarding hospice, they are already with [facility] for outpatient palliative services.   NCM informed them they may contact them tomorrow, then this NCM will follow up with recommendations.  Patient has  home oxygen with [employer] 2 liters, walker and shower chair.  [name] states they are talking about getting a humidifier on the oxygen because it dries him out so much. NCM spoke with Cheri with [place], she will contact [place] to discuss home hospice.    Expected Discharge Plan: Home w Hospice Care Barriers to Discharge: No Barriers Identified   Patient Goals and CMS Choice Patient states their goals for this hospitalization and ongoing recovery are:: home and will get hospice set up at home CMS Medicare.gov Compare Post Acute Care list provided to:: Patient Choice offered to / list presented to : [place] / Guardian  Expected Discharge Plan and Services Expected Discharge Plan: [place]   Discharge Planning Services: CM Consult   Living arrangements for the past 2 months: Single Family Home Expected Discharge Date: 02/22/21               DME Arranged: Other see comment (humidification for oxygen) DME Agency: [employer] Date DME Agency Contacted: 02/22/21 Time DME Agency Contacted: 7001 Representative spoke with at DME Agency: Thedore Mins HH Arranged: RN [place] Agency: [place] Date [place]: 02/22/21 Time [place]: 27 Representative spoke with at [place]: [place] Arrangements/Services Living arrangements for the past 2 months: [place] Lives with:: Adult Children   Do you feel safe going back to the place where you live?: Yes      Need for Family Participation in Patient Care: Yes (Comment) Care giver support system in place?: Yes (comment) Current home services: Home RN, Home PT Criminal Activity/Legal Involvement Pertinent to Current Situation/Hospitalization: No - Comment as needed  Activities of Daily Living      Permission Sought/Granted                  Emotional Assessment Appearance:: Appears stated age     Orientation: : Oriented to Self, Oriented to Place, Oriented to  Time, Oriented to Situation Alcohol / Substance Use: Not Applicable Psych Involvement: No (comment)  Admission diagnosis:  Acute on chronic respiratory failure with hypoxia (HCC) [J96.21] Acute on chronic respiratory failure, unspecified whether with hypoxia or hypercapnia (HCC) [J96.20] Chronic obstructive pulmonary disease, unspecified COPD type ([place]) [J44.9] Acute on chronic congestive heart failure, unspecified heart failure type [facility]) [I50.9] Patient Active Problem List   Diagnosis Date Noted   COPD not affecting current episode of care ([place]) 02/19/2021   Acute renal failure superimposed on stage 3b chronic kidney disease ([place]) 02/19/2021   DNR (do not resuscitate) 02/19/2021   Prolonged QT interval 02/19/2021  Acute on chronic respiratory failure with hypoxia (HCC) 02/18/2021   Chronic kidney disease 01/26/2021   Influenza B 01/19/2021   COPD exacerbation ([place]) 01/18/2021   Malnutrition of moderate degree 12/25/2020   Atrial flutter with rapid ventricular response ([place]) 12/25/2020   Acute combined systolic and diastolic congestive heart failure ([place]) 12/24/2020   A-fib ([place]) 12/24/2020   Iron  deficiency anemia 11/04/2020   Anemia 09/22/2020   Arthritis 09/22/2020   Asthma 09/22/2020   Family history of adverse reaction to anesthesia 09/22/2020   Glaucoma 09/22/2020   Pneumonia due to COVID-19 virus 07/19/2018   COVID-19 virus infection 07/18/2018   CKD (chronic kidney disease), stage IV ([place]) 11/27/2017   Hyperlipidemia 11/02/2017   TIA (transient ischemic attack) 11/01/2017   CHF (congestive heart failure) ([place]) 11/01/2017   HTN (hypertension) 11/01/2017   PCP:  Street, [name], MD Pharmacy:   [facility], [address] [place] [address] Phone: [phone] Fax: [place] [address] [place] [address] Phone: [phone] Fax: [phone]     Social Determinants of Health (SDOH) Interventions    Readmission Risk Interventions Readmission Risk Prevention Plan 02/22/2021 01/21/2021  Transportation Screening Complete Complete  PCP or Specialist Appt within 3-5 Days - Not Complete  HRI or [place] - Complete  Social Work Consult for [place] Planning/Counseling - Complete  Palliative Care Screening - Complete  Medication Review Press photographer) Complete Referral to Pharmacy  PCP or Specialist appointment within 3-5 days of discharge Complete -  [place] or Home Care Consult Complete -  SW Recovery Care/Counseling Consult Complete -  Palliative Care Screening Complete -  [place] Not Applicable -  Some recent data might be hidden

## 2021-02-22 NOTE — Progress Notes (Signed)
° °   I have been able to talk with the pt's daughter [name]. The grandson was not able to be present today. He was working.  We discussed transition from [place] at home to a more comfort based approach with hospice services. [name] reports that they are in agreement with this transition. The pt has been approved for hospice care by our MD and we plan to follow up once d/c home. They have all equipment they need in home at this time.   Webb Silversmith RN [phone]

## 2021-02-22 NOTE — Progress Notes (Signed)
Pt would like to wait on his morning breathing treatments.

## 2021-02-22 NOTE — TOC Transition Note (Addendum)
Transition of Care [facility]) - CM/SW Discharge Note   Patient Details  Name: CEDRIC MCCLAINE MRN: [id number] Date of Birth: 1933-01-23  Transition of Care [facility]) CM/SW Contact:  Zenon Mayo, RN Phone Number: 02/22/2021, 11:45 AM   Clinical Narrative:    Patient for dc today, [name], POA is at the bedside.  She states [place] is going to come to the house today to talk to them about hospice at home.  She states they were with [facility] but they do not need HH services at this time, since a RN will be coming out with hospice.  He will be able to travel by car.  NCM informed Tommi Rumps with [facility] of this information. Patient palliative services will switch over to home hospice.    Final next level of care: Home w Hospice Care Barriers to Discharge: No Barriers Identified   Patient Goals and CMS Choice Patient states their goals for this hospitalization and ongoing recovery are:: home with hospce CMS Medicare.gov Compare Post Acute Care list provided to:: Patient Represenative (must comment) Choice offered to / list presented to : [facility] POA / Guardian [name] POA)  Discharge Placement                       Discharge Plan and Services   Discharge Planning Services: CM Consult            DME Arranged: Other see comment (humidification for oxygen) DME Agency: [employer] Date DME Agency Contacted: 02/22/21 Time DME Agency Contacted: [phone] Representative spoke with at DME Agency: [place]: RN [place] Agency: [place] Date [place]: 02/22/21 Time [place]: 1142 Representative spoke with at [place]: Victoria Vera ([place]) Interventions     Readmission Risk Interventions Readmission Risk Prevention Plan 02/22/2021 01/21/2021  Transportation Screening Complete Complete  PCP or Specialist Appt within 3-5 Days - Not Complete  HRI or [place] - Complete  Social Work Consult for [place]  Planning/Counseling - Complete  Palliative Care Screening - Complete  Medication Review Press photographer) Complete Referral to Pharmacy  PCP or Specialist appointment within 3-5 days of discharge Complete -  [place] or Home Care Consult Complete -  SW Recovery Care/Counseling Consult Complete -  Palliative Care Screening Complete -  [place] Not Applicable -  Some recent data might be hidden

## 2021-02-24 LAB — CULTURE, BLOOD (ROUTINE X 2)
Culture: NO GROWTH
Culture: NO GROWTH
Special Requests: ADEQUATE

## 2021-03-14 ENCOUNTER — Ambulatory Visit: Payer: PPO | Admitting: Cardiology

## 2021-03-25 ENCOUNTER — Ambulatory Visit: Payer: PPO | Admitting: Cardiology

## 2021-06-23 ENCOUNTER — Other Ambulatory Visit (HOSPITAL_BASED_OUTPATIENT_CLINIC_OR_DEPARTMENT_OTHER): Payer: Self-pay

## 2022-04-10 DEATH — deceased

## 2023-10-11 ENCOUNTER — Other Ambulatory Visit (HOSPITAL_BASED_OUTPATIENT_CLINIC_OR_DEPARTMENT_OTHER): Payer: Self-pay
